# Patient Record
Sex: Male | Born: 1950
Health system: Southern US, Community
[De-identification: ages and names within clinical notes are randomized; demographics above are authoritative.]

## PROBLEM LIST (undated history)

## (undated) DIAGNOSIS — C61 Malignant neoplasm of prostate: Secondary | ICD-10-CM

## (undated) DIAGNOSIS — J449 Chronic obstructive pulmonary disease, unspecified: Secondary | ICD-10-CM

## (undated) DIAGNOSIS — J189 Pneumonia, unspecified organism: Secondary | ICD-10-CM

## (undated) DIAGNOSIS — I2510 Atherosclerotic heart disease of native coronary artery without angina pectoris: Secondary | ICD-10-CM

## (undated) DIAGNOSIS — I10 Essential (primary) hypertension: Secondary | ICD-10-CM

## (undated) DIAGNOSIS — Z720 Tobacco use: Secondary | ICD-10-CM

## (undated) DIAGNOSIS — R350 Frequency of micturition: Secondary | ICD-10-CM

## (undated) DIAGNOSIS — K429 Umbilical hernia without obstruction or gangrene: Secondary | ICD-10-CM

## (undated) DIAGNOSIS — H269 Unspecified cataract: Secondary | ICD-10-CM

## (undated) DIAGNOSIS — R0600 Dyspnea, unspecified: Secondary | ICD-10-CM

## (undated) DIAGNOSIS — M1990 Unspecified osteoarthritis, unspecified site: Secondary | ICD-10-CM

## (undated) DIAGNOSIS — I219 Acute myocardial infarction, unspecified: Secondary | ICD-10-CM

## (undated) DIAGNOSIS — G4730 Sleep apnea, unspecified: Secondary | ICD-10-CM

## (undated) HISTORY — DX: Atherosclerotic heart disease of native coronary artery without angina pectoris: I25.10

## (undated) HISTORY — PX: HERNIA REPAIR: SHX51

## (undated) HISTORY — DX: Unspecified cataract: H26.9

## (undated) HISTORY — PX: FRACTURE SURGERY: SHX138

---

## 1970-01-20 HISTORY — PX: OTHER SURGICAL HISTORY: SHX169

## 1990-01-20 HISTORY — PX: OTHER SURGICAL HISTORY: SHX169

## 2014-07-18 ENCOUNTER — Other Ambulatory Visit: Payer: Self-pay | Admitting: Urology

## 2014-08-08 NOTE — Patient Instructions (Addendum)
John Griffin  08/08/2014   Your procedure is scheduled on: 08/17/2014    Report to [facility] Main  Entrance take [facility]  elevators to 3rd floor to  [place] at     0900 AM.  Call this number if you have problems the morning of surgery [phone]   Remember: ONLY 1 PERSON MAY GO WITH YOU TO SHORT STAY TO GET  READY MORNING OF [name].  Do not eat food or drink liquids :After Midnight.     Take these medicines the morning of surgery with A SIP OF WATER:Zyrtec if needed                                 You may not have any metal on your body including hair pins and              piercings  Do not wear jewelry, , lotions, powders or perfumes, deodorant                         Men may shave face and neck.   Do not bring valuables to the hospital. [place].  Contacts, dentures or bridgework may not be worn into surgery.  Leave suitcase in the car. After surgery it may be brought to your room.       Special Instructions: coughing and deep breathing exercises, leg exercises               Please read over the following fact sheets you were given: _____________________________________________________________________             [facility] - Preparing for Surgery Before surgery, you can play an important role.  Because skin is not sterile, your skin needs to be as free of germs as possible.  You can reduce the number of germs on your skin by washing with CHG (chlorahexidine gluconate) soap before surgery.  CHG is an antiseptic cleaner which kills germs and bonds with the skin to continue killing germs even after washing. Please DO NOT use if you have an allergy to CHG or antibacterial soaps.  If your skin becomes reddened/irritated stop using the CHG and inform your nurse when you arrive at Short Stay. Do not shave (including legs and underarms) for at least 48 hours prior to the first CHG shower.  You  may shave your face/neck. Please follow these instructions carefully:  1.  Shower with CHG Soap the night before surgery and the  morning of Surgery.  2.  If you choose to wash your hair, wash your hair first as usual with your  normal  shampoo.  3.  After you shampoo, rinse your hair and body thoroughly to remove the  shampoo.                           4.  Use CHG as you would any other liquid soap.  You can apply chg directly  to the skin and wash                       Gently with a scrungie or clean washcloth.  5.  Apply the CHG  Soap to your body ONLY FROM THE NECK DOWN.   Do not use on face/ open                           Wound or open sores. Avoid contact with eyes, ears mouth and genitals (private parts).                       Wash face,  Genitals (private parts) with your normal soap.             6.  Wash thoroughly, paying special attention to the area where your surgery  will be performed.  7.  Thoroughly rinse your body with warm water from the neck down.  8.  DO NOT shower/wash with your normal soap after using and rinsing off  the CHG Soap.                9.  Pat yourself dry with a clean towel.            10.  Wear clean pajamas.            11.  Place clean sheets on your bed the night of your first shower and do not  sleep with pets. Day of Surgery : Do not apply any lotions/deodorants the morning of surgery.  Please wear clean clothes to the hospital/surgery center.  FAILURE TO FOLLOW THESE INSTRUCTIONS MAY RESULT IN THE CANCELLATION OF YOUR SURGERY PATIENT SIGNATURE_________________________________  NURSE SIGNATURE__________________________________  ________________________________________________________________________  WHAT IS A BLOOD TRANSFUSION? Blood Transfusion Information  A transfusion is the replacement of blood or some of its parts. Blood is made up of multiple cells which provide different functions.  Red blood cells carry oxygen and are used for blood loss  replacement.  White blood cells fight against infection.  Platelets control bleeding.  Plasma helps clot blood.  Other blood products are available for specialized needs, such as hemophilia or other clotting disorders. BEFORE THE TRANSFUSION  Who gives blood for transfusions?   Healthy volunteers who are fully evaluated to make sure their blood is safe. This is blood bank blood. Transfusion therapy is the safest it has ever been in the practice of medicine. Before blood is taken from a donor, a complete history is taken to make sure that person has no history of diseases nor engages in risky social behavior (examples are intravenous drug use or sexual activity with multiple partners). The donor's travel history is screened to minimize risk of transmitting infections, such as malaria. The donated blood is tested for signs of infectious diseases, such as HIV and hepatitis. The blood is then tested to be sure it is compatible with you in order to minimize the chance of a transfusion reaction. If you or a relative donates blood, this is often done in anticipation of surgery and is not appropriate for emergency situations. It takes many days to process the donated blood. RISKS AND COMPLICATIONS Although transfusion therapy is very safe and saves many lives, the main dangers of transfusion include:  1. Getting an infectious disease. 2. Developing a transfusion reaction. This is an allergic reaction to something in the blood you were given. Every precaution is taken to prevent this. The decision to have a blood transfusion has been considered carefully by your caregiver before blood is given. Blood is not given unless the benefits outweigh the risks. AFTER THE TRANSFUSION  Right after receiving a blood transfusion, you  will usually feel much better and more energetic. This is especially true if your red blood cells have gotten low (anemic). The transfusion raises the level of the red blood cells which  carry oxygen, and this usually causes an energy increase.  The nurse administering the transfusion will monitor you carefully for complications. HOME CARE INSTRUCTIONS  No special instructions are needed after a transfusion. You may find your energy is better. Speak with your caregiver about any limitations on activity for underlying diseases you may have. SEEK MEDICAL CARE IF:   Your condition is not improving after your transfusion.  You develop redness or irritation at the intravenous (IV) site. SEEK IMMEDIATE MEDICAL CARE IF:  Any of the following symptoms occur over the next 12 hours:  Shaking chills.  You have a temperature by mouth above 102 F (38.9 C), not controlled by medicine.  Chest, back, or muscle pain.  People around you feel you are not acting correctly or are confused.  Shortness of breath or difficulty breathing.  Dizziness and fainting.  You get a rash or develop hives.  You have a decrease in urine output.  Your urine turns a dark color or changes to pink, red, or brown. Any of the following symptoms occur over the next 10 days:  You have a temperature by mouth above 102 F (38.9 C), not controlled by medicine.  Shortness of breath.  Weakness after normal activity.  The white part of the eye turns yellow (jaundice).  You have a decrease in the amount of urine or are urinating less often.  Your urine turns a dark color or changes to pink, red, or brown. Document Released: 01/04/2000 Document Revised: 03/31/2011 Document Reviewed: 08/23/2007 ExitCare Patient Information 2014 [place].  _______________________________________________________________________  Incentive Spirometer  An incentive spirometer is a tool that can help keep your lungs clear and active. This tool measures how well you are filling your lungs with each breath. Taking long deep breaths may help reverse or decrease the chance of developing breathing (pulmonary) problems  (especially infection) following:  A long period of time when you are unable to move or be active. BEFORE THE PROCEDURE   If the spirometer includes an indicator to show your best effort, your nurse or respiratory therapist will set it to a desired goal.  If possible, sit up straight or lean slightly forward. Try not to slouch.  Hold the incentive spirometer in an upright position. INSTRUCTIONS FOR USE  3. Sit on the edge of your bed if possible, or sit up as far as you can in bed or on a chair. 4. Hold the incentive spirometer in an upright position. 5. Breathe out normally. 6. Place the mouthpiece in your mouth and seal your lips tightly around it. 7. Breathe in slowly and as deeply as possible, raising the piston or the ball toward the top of the column. 8. Hold your breath for 3-5 seconds or for as long as possible. Allow the piston or ball to fall to the bottom of the column. 9. Remove the mouthpiece from your mouth and breathe out normally. 10. Rest for a few seconds and repeat Steps 1 through 7 at least 10 times every 1-2 hours when you are awake. Take your time and take a few normal breaths between deep breaths. 11. The spirometer may include an indicator to show your best effort. Use the indicator as a goal to work toward during each repetition. 12. After each set of 10 deep breaths, practice  coughing to be sure your lungs are clear. If you have an incision (the cut made at the time of surgery), support your incision when coughing by placing a pillow or rolled up towels firmly against it. Once you are able to get out of bed, walk around indoors and cough well. You may stop using the incentive spirometer when instructed by your caregiver.  RISKS AND COMPLICATIONS  Take your time so you do not get dizzy or light-headed.  If you are in pain, you may need to take or ask for pain medication before doing incentive spirometry. It is harder to take a deep breath if you are having  pain. AFTER USE  Rest and breathe slowly and easily.  It can be helpful to keep track of a log of your progress. Your caregiver can provide you with a simple table to help with this. If you are using the spirometer at home, follow these instructions: [place] IF:   You are having difficultly using the spirometer.  You have trouble using the spirometer as often as instructed.  Your pain medication is not giving enough relief while using the spirometer.  You develop fever of 100.5 F (38.1 C) or higher. SEEK IMMEDIATE MEDICAL CARE IF:   You cough up bloody sputum that had not been present before.  You develop fever of 102 F (38.9 C) or greater.  You develop worsening pain at or near the incision site. MAKE SURE YOU:   Understand these instructions.  Will watch your condition.  Will get help right away if you are not doing well or get worse. Document Released: 05/19/2006 Document Revised: 03/31/2011 Document Reviewed: 07/20/2006 [facility] Patient Information 2014 [place], [place].   ________________________________________________________________________

## 2014-08-09 ENCOUNTER — Ambulatory Visit (HOSPITAL_COMMUNITY)
Admission: RE | Admit: 2014-08-09 | Discharge: 2014-08-09 | Disposition: A | Discharge status: Home / Self Care | Payer: BC Managed Care – PPO | Source: Ambulatory Visit | Attending: Urology | Admitting: Urology

## 2014-08-09 ENCOUNTER — Encounter (HOSPITAL_COMMUNITY)
Admission: RE | Admit: 2014-08-09 | Discharge: 2014-08-09 | Disposition: A | Discharge status: Home / Self Care | Payer: BC Managed Care – PPO | Source: Ambulatory Visit | Attending: Urology | Admitting: Urology

## 2014-08-09 ENCOUNTER — Encounter (HOSPITAL_COMMUNITY): Payer: Self-pay

## 2014-08-09 DIAGNOSIS — Z01818 Encounter for other preprocedural examination: Secondary | ICD-10-CM | POA: Diagnosis not present

## 2014-08-09 DIAGNOSIS — Z720 Tobacco use: Secondary | ICD-10-CM | POA: Diagnosis not present

## 2014-08-09 DIAGNOSIS — R0602 Shortness of breath: Secondary | ICD-10-CM | POA: Insufficient documentation

## 2014-08-09 DIAGNOSIS — R05 Cough: Secondary | ICD-10-CM | POA: Diagnosis not present

## 2014-08-09 HISTORY — DX: Frequency of micturition: R35.0

## 2014-08-09 HISTORY — DX: Unspecified osteoarthritis, unspecified site: M19.90

## 2014-08-09 LAB — BASIC METABOLIC PANEL
Anion gap: 7 (ref 5–15)
BUN: 13 mg/dL (ref 6–20)
CO2: 28 mmol/L (ref 22–32)
Calcium: 9.4 mg/dL (ref 8.9–10.3)
Chloride: 105 mmol/L (ref 101–111)
Creatinine, Ser: 0.62 mg/dL (ref 0.61–1.24)
GFR calc non Af Amer: >60 mL/min (ref >60)
GLUCOSE: 124 mg/dL — AB (ref 65–99)
Potassium: 4.2 mmol/L (ref 3.5–5.1)
SODIUM: 140 mmol/L (ref 135–145)

## 2014-08-09 LAB — CBC
HCT: 49.4 % (ref 39.0–52.0)
Hemoglobin: 17.1 g/dL — ABNORMAL HIGH (ref 13.0–17.0)
MCH: 31.8 pg (ref 26.0–34.0)
MCHC: 34.6 g/dL (ref 30.0–36.0)
MCV: 92 fL (ref 78.0–100.0)
PLATELETS: 168 10*3/uL (ref 150–400)
RBC: 5.37 MIL/uL (ref 4.22–5.81)
RDW: 13.1 % (ref 11.5–15.5)
WBC: 11.3 10*3/uL — AB (ref 4.0–10.5)

## 2014-08-09 LAB — ABO/RH: ABO/RH(D): A NEG

## 2014-08-10 NOTE — Progress Notes (Signed)
Final EKg done 08/09/14 in [facility].

## 2014-08-16 MED ORDER — DEXTROSE 5 % IV SOLN
3.0000 g | INTRAVENOUS | Status: AC
  Administered 2014-08-17: 3 g via INTRAVENOUS
  Filled 2014-08-16: qty 3000

## 2014-08-16 NOTE — H&P (Signed)
Chief Complaint Prostate Cancer   Reason For Visit Reason for consult: To discuss treatment options for prostate cancer and specifically to consider a robotic prostatectomy.  Physician requesting consult: Dr. Eda Keys  PCP: Dr. Juanita Craver   History of Present Illness John Griffin is a 64 year old gentleman who was found to have an elevated PSA of 6.7 prompting a prostate needle biopsy by Dr. Junious Silk on 06/06/14. This demonstrated Gleason 4+3=7 adenocarcinoma of the prostate with 4 out of 12 biopsy cores positive for malignancy. He has no family history of prostate cancer. He has been counseled about his treatment options by Dr. Junious Silk. He also apparently underwent a CT scan (hematuria protocol) which apparently was ordered due to microscopic hematuria following his prostate biopsy. He denies a past history of gross hematuria or microscopic hematuria prior to his biopsy.    ** His past medical history is significant for a 40+-pack-year history of smoking. His father was also a long-time smoker and apparently developed COPD and died at age 65. There is no family history of cardiovascular disease.     TNM stage: cT1c Nx Mx  PSA: 6.7  Gleason score: 4+3=7  Biopsy (06/06/14): 4/12 cores positive -- L apex (50%, 4+3=7), L mid (60%, 4+3=7), L lateral base (80%, 3+4=7), L base (20%, 3+4=7)  Prostate volume: 33.0 cc    Nomogram  OC disease: 37%  EPE: 60%  SVI: 7%  LNI: 8%  PFS (surgery): 67% at 4 years, 51% at 9 years    Urinary function: He has moderate baseline voiding symptoms including urinary frequency, intermittency, urgency, weak stream, and nocturia 2 times per night. IPSS is 17.  Erectile function: He does have severe baseline erectile dysfunction. SHIM score is 1.   Past Medical History Problems  1. History of essential hypertension (Z86.79)  Surgical History Problems  1. History of Leg Repair 2. History of Shoulder Surgery  Current Meds 1. Aspirin  325 MG Oral Tablet;  Therapy: (Recorded:20Jun2016) to Recorded 2. Flonase 50 MCG/ACT SUSP;  Therapy: (Recorded:20Jun2016) to Recorded 3. Zyrtec TABS;  Therapy: (Recorded:20Jun2016) to Recorded  Allergies Medication  1. No Known Drug Allergies  Family History Problems  1. Denied: Family history of prostate cancer  Social History Problems    Current every day smoker (F17.200)   Father deceased   Married   Occasional alcohol use   Retired   Two children  Review of Systems Genitourinary, constitutional, skin, eye, otolaryngeal, hematologic/lymphatic, cardiovascular, pulmonary, endocrine, musculoskeletal, gastrointestinal, neurological and psychiatric system(s) were reviewed and pertinent findings if present are noted and are otherwise negative.  Genitourinary: no hematuria.  Constitutional: no night sweats.  Hematologic/Lymphatic: no swollen glands.  Cardiovascular: no chest pain and no leg swelling.  Respiratory: no shortness of breath, no cough, no wheezing and no shortness of breath during exertion.    Vitals Vital Signs [Data Includes: Last 1 Day]  Recorded: 28Jun2016 08:04AM  Blood Pressure: 135 / 83 Temperature: 97.5 F Heart Rate: 87 Recorded: 20Jun2016 09:49AM  Blood Pressure: 190 / 116 Temperature: 97.9 F Heart Rate: 116  Physical Exam Constitutional: Well nourished and well developed . No acute distress.  ENT:. The ears and nose are normal in appearance.  Neck: The appearance of the neck is normal and no neck mass is present.  Pulmonary: No respiratory distress and normal respiratory rhythm and effort.  Cardiovascular: Heart rate and rhythm are normal . No peripheral edema.  Abdomen: The abdomen is obese. The abdomen is soft and nontender. No  masses are palpated. No CVA tenderness. No hepatosplenomegaly noted. He has a very large and reducible umbilical hernia.  Rectal: Rectal exam demonstrates normal sphincter tone, no tenderness and no masses. Prostate  size is estimated to be 40 g. The prostate has no nodularity and is not tender. The left seminal vesicle is nonpalpable. The right seminal vesicle is nonpalpable. The perineum is normal on inspection.  Lymphatics: The supraclavicular, femoral and inguinal nodes are not enlarged or tender.  Skin: Normal skin turgor, no visible rash and no visible skin lesions.  Neuro/Psych:. Mood and affect are appropriate.    Results/Data Urine [Data Includes: Last 1 Day]   [id number]  COLOR AMBER   APPEARANCE CLEAR   SPECIFIC GRAVITY 1.030   pH 6.0   GLUCOSE NEG mg/dL  BILIRUBIN NEG   KETONE NEG mg/dL  BLOOD TRACE   PROTEIN NEG mg/dL  UROBILINOGEN 0.2 mg/dL  NITRITE NEG   LEUKOCYTE ESTERASE NEG   SQUAMOUS EPITHELIAL/HPF RARE   WBC NONE SEEN WBC/hpf  RBC 0-2 RBC/hpf  BACTERIA RARE   CRYSTALS NONE SEEN   CASTS NONE SEEN   Other MUCUS   Selected Results  AU CT-HEMATURIA PROTOCOL [id number] 12:00AM Festus Aloe   Test Name Result Flag Reference  AU CT-HEMATURIA PROTOCOL (Report)    ** RADIOLOGY REPORT BY [facility] **   CLINICAL DATA: Microhematuria. Newly diagnosed prostate carcinoma.  EXAM: CT ABDOMEN AND PELVIS WITHOUT AND WITH CONTRAST  TECHNIQUE: Multidetector CT imaging of the abdomen and pelvis was performed following the standard protocol before and following the bolus administration of intravenous contrast.  CONTRAST: 125 mL Isovue 300  COMPARISON: None.  FINDINGS: Lower chest: No acute findings. Mild scarring noted in right lung base.  Hepatobiliary: No masses or other significant abnormality identified. Gallbladder is unremarkable.  Pancreas: No evidence of mass, inflammatory changes, or other significant abnormality.  Spleen: Within normal limits in size and appearance.  Adrenal Glands: No masses identified.  Kidneys/Urinary Tract: No evidence of urolithiasis or hydronephrosis. No solid or complex cystic renal masses identified. No masses or  calculi seen involving the lower urinary tract. Circumaortic left renal vein incidentally noted.  Stomach/Bowel/Peritoneum: No evidence of wall thickening, mass, or obstruction. Mild sigmoid diverticulosis is noted, without evidence of diverticulitis.  Vascular/Lymphatic: No pathologically enlarged lymph nodes identified. No abdominal aortic aneurysm or other significant retroperitoneal abnormality noted.  Reproductive: No masses or other significant abnormality identified. Normal size prostate gland. Seminal vesicles are symmetric.  Other: Small paraumbilical ventral hernia is seen containing only fat. No evidence of herniated bowel loops.  Musculoskeletal: No suspicious bone lesions identified. Moderate lumbar spine degenerative spondylosis noted.  IMPRESSION: No evidence of metastatic disease within the abdomen or pelvis.  No radiographic evidence of urinary tract neoplasm, urolithiasis, or hydronephrosis.  Small paraumbilical ventral hernia containing only fat.   Electronically Signed  By: Earle Gell M.D.  On: 07/14/2014 15:22   BUN & CREATININE [id number] 10:29AM Festus Aloe  SPECIMEN TYPE: BLOOD   Test Name Result Flag Reference  CREATININE 0.63 mg/dL  0.50-1.50  BUN 13 mg/dL  6-23  Est GFR, African American >89 mL/min    Est GFR, NonAfrican American >89 mL/min    THE ESTIMATED GFR IS A CALCULATION VALID FOR ADULTS (>=71 YEARS OLD) THAT USES THE CKD-EPI ALGORITHM TO ADJUST FOR AGE AND SEX. IT IS   NOT TO BE USED FOR CHILDREN, PREGNANT WOMEN, HOSPITALIZED PATIENTS,    PATIENTS ON DIALYSIS, OR WITH RAPIDLY CHANGING KIDNEY FUNCTION. ACCORDING TO THE  NKDEP, EGFR >89 IS NORMAL, 60-89 SHOWS MILD IMPAIRMENT, 30-59 SHOWS MODERATE IMPAIRMENT, 15-29 SHOWS SEVERE IMPAIRMENT AND <15 IS ESRD.    I have independently reviewed his CT scan. There is no evidence of pelvic lymphadenopathy. I have also reviewed his medical records, PSA results, and pathology report. Findings  are as dictated above.  Assessment Assessed  1. Prostate cancer (C61)  Plan Health Maintenance  1. UA With REFLEX; [Do Not Release]; Status:Complete;   Done: [id number] 07:46AM Prostate cancer  2. Follow-up Office  Follow-up - will call to schedule surgery  Status: Canceled -  Appointment,Date of Service 3. Follow-up Schedule Surgery Office  Follow-up  Status: Complete  Done: [id number] 4. PT/OT Referral Referral  Referral  Status: Hold For - Date of Service,Physical Therapy   Requested for: 11Jul2016  Discussion/Summary 1. Prostate cancer: Mr. Gargis feels very well informed about his options. He does strongly wish to proceed with surgical therapy and feels that this would be most beneficial for his voiding symptoms as well as treatment of his cancer.   The patient was counseled about the natural history of prostate cancer and the standard treatment options that are available for prostate cancer. It was explained to him how his age and life expectancy, clinical stage, Gleason score, and PSA affect his prognosis, the decision to proceed with additional staging studies, as well as how that information influences recommended treatment strategies. We discussed the roles for active surveillance, radiation therapy, surgical therapy, androgen deprivation, as well as ablative therapy options for the treatment of prostate cancer as appropriate to his individual cancer situation. We discussed the risks and benefits of these options with regard to their impact on cancer control and also in terms of potential adverse events, complications, and impact on quiality of life particularly related to urinary, bowel, and sexual function. The patient was encouraged to ask questions throughout the discussion today and all questions were answered to his stated satisfaction. In addition, the patient was provided with and/or directed to appropriate resources and literature for further education about prostate cancer and  treatment options.   We discussed surgical therapy for prostate cancer including the different available surgical approaches. We discussed, in detail, the risks and expectations of surgery with regard to cancer control, urinary control, and erectile function as well as the expected postoperative recovery process. Additional risks of surgery including but not limited to bleeding, infection, hernia formation, nerve damage, lymphocele formation, bowel/rectal injury potentially necessitating colostomy, damage to the urinary tract resulting in urine leakage, urethral stricture, and the cardiopulmonary risks such as myocardial infarction, stroke, death, venothromboembolism, etc. were explained. The risk of open surgical conversion for robotic/laparoscopic prostatectomy was also discussed.     He will be scheduled for a right nerve sparing robot-assisted laparoscopic radical prostatectomy and pelvic lymphadenectomy. I did offer him a prescription for nicotine patches and recommended that he try to stop smoking prior to his surgery. He will give this consideration. He at least plans to try to cut back on his own although we'll give consideration to quitting smoking prior to his general anesthetic. He understands this would be his main perioperative risk factor.    2. Umbilical hernia: He does have a large but reducible umbilical hernia. I did tell him we could look at this intraoperatively and see if this could be repaired primarily although did not recommend mesh repair at the same time as his prostatectomy. We have discussed the potential risks including the increased risk of recurrence with a primary  repair versus mesh repair. He expressed his understanding and gives consent.    Cc: Dr. Juanita Craver  Dr. Festus Aloe  A total of 75 minutes were spent in the overall care of the patient today with 55 minutes in direct face to face consultation.    Signatures Electronically signed by : Raynelle Bring, M.D.; Jul 18 2014 10:33AM EST

## 2014-08-17 ENCOUNTER — Inpatient Hospital Stay (HOSPITAL_COMMUNITY): Payer: BC Managed Care – PPO | Admitting: Anesthesiology

## 2014-08-17 ENCOUNTER — Encounter (HOSPITAL_COMMUNITY): Payer: Self-pay | Admitting: *Deleted

## 2014-08-17 ENCOUNTER — Inpatient Hospital Stay (HOSPITAL_COMMUNITY)
Admission: RE | Admit: 2014-08-17 | Discharge: 2014-08-18 | DRG: 708 | Disposition: A | Discharge status: Home / Self Care | Payer: BC Managed Care – PPO | Source: Ambulatory Visit | Attending: Urology | Admitting: Urology

## 2014-08-17 ENCOUNTER — Encounter (HOSPITAL_COMMUNITY)
Admission: RE | Disposition: A | Discharge status: Home / Self Care | Payer: Self-pay | Source: Ambulatory Visit | Attending: Urology

## 2014-08-17 DIAGNOSIS — R3912 Poor urinary stream: Secondary | ICD-10-CM | POA: Diagnosis present

## 2014-08-17 DIAGNOSIS — N529 Male erectile dysfunction, unspecified: Secondary | ICD-10-CM | POA: Diagnosis present

## 2014-08-17 DIAGNOSIS — F17210 Nicotine dependence, cigarettes, uncomplicated: Secondary | ICD-10-CM | POA: Diagnosis present

## 2014-08-17 DIAGNOSIS — R351 Nocturia: Secondary | ICD-10-CM | POA: Diagnosis present

## 2014-08-17 DIAGNOSIS — R350 Frequency of micturition: Secondary | ICD-10-CM | POA: Diagnosis present

## 2014-08-17 DIAGNOSIS — C61 Malignant neoplasm of prostate: Secondary | ICD-10-CM | POA: Diagnosis present

## 2014-08-17 DIAGNOSIS — K429 Umbilical hernia without obstruction or gangrene: Secondary | ICD-10-CM | POA: Diagnosis present

## 2014-08-17 DIAGNOSIS — Z7982 Long term (current) use of aspirin: Secondary | ICD-10-CM

## 2014-08-17 DIAGNOSIS — K5730 Diverticulosis of large intestine without perforation or abscess without bleeding: Secondary | ICD-10-CM | POA: Diagnosis present

## 2014-08-17 DIAGNOSIS — R312 Other microscopic hematuria: Secondary | ICD-10-CM | POA: Diagnosis present

## 2014-08-17 HISTORY — PX: ROBOT ASSISTED LAPAROSCOPIC RADICAL PROSTATECTOMY: SHX5141

## 2014-08-17 HISTORY — PX: LYMPHADENECTOMY: SHX5960

## 2014-08-17 LAB — HEMOGLOBIN AND HEMATOCRIT, BLOOD
HCT: 46.3 % (ref 39.0–52.0)
Hemoglobin: 15.7 g/dL (ref 13.0–17.0)

## 2014-08-17 LAB — TYPE AND SCREEN
ABO/RH(D): A NEG
Antibody Screen: NEGATIVE

## 2014-08-17 SURGERY — ROBOTIC ASSISTED LAPAROSCOPIC RADICAL PROSTATECTOMY LEVEL 3
Anesthesia: General

## 2014-08-17 MED ORDER — HEPARIN SODIUM (PORCINE) 1000 UNIT/ML IJ SOLN
INTRAMUSCULAR | Status: AC
  Filled 2014-08-17: qty 1

## 2014-08-17 MED ORDER — GLYCOPYRROLATE 0.2 MG/ML IJ SOLN
INTRAMUSCULAR | Status: DC | PRN
Start: 2014-08-17 — End: 2014-08-17
  Administered 2014-08-17: .8 mg via INTRAVENOUS

## 2014-08-17 MED ORDER — FENTANYL CITRATE (PF) 250 MCG/5ML IJ SOLN
INTRAMUSCULAR | Status: AC
  Filled 2014-08-17: qty 5

## 2014-08-17 MED ORDER — MORPHINE SULFATE 2 MG/ML IJ SOLN: 2.0000 mg | INTRAMUSCULAR | Status: DC | PRN

## 2014-08-17 MED ORDER — DIPHENHYDRAMINE HCL 12.5 MG/5ML PO ELIX: 12.5000 mg | ORAL_SOLUTION | Freq: Four times a day (QID) | ORAL | Status: DC | PRN

## 2014-08-17 MED ORDER — ACETAMINOPHEN 325 MG PO TABS: 650.0000 mg | ORAL_TABLET | ORAL | Status: DC | PRN

## 2014-08-17 MED ORDER — DOCUSATE SODIUM 100 MG PO CAPS
100.0000 mg | ORAL_CAPSULE | Freq: Two times a day (BID) | ORAL | Status: DC
  Administered 2014-08-17 – 2014-08-18 (×2): 100 mg via ORAL
  Filled 2014-08-17 (×3): qty 1

## 2014-08-17 MED ORDER — HYDROMORPHONE HCL 1 MG/ML IJ SOLN: 0.2500 mg | INTRAMUSCULAR | Status: DC | PRN

## 2014-08-17 MED ORDER — HYDROMORPHONE HCL 1 MG/ML IJ SOLN
INTRAMUSCULAR | Status: DC | PRN
  Administered 2014-08-17 (×5): .4 mg via INTRAVENOUS

## 2014-08-17 MED ORDER — ARTIFICIAL TEARS OP OINT
TOPICAL_OINTMENT | OPHTHALMIC | Status: AC
  Filled 2014-08-17: qty 3.5

## 2014-08-17 MED ORDER — DOCUSATE SODIUM 100 MG PO CAPS: 100.0000 mg | ORAL_CAPSULE | Freq: Two times a day (BID) | ORAL | Status: DC

## 2014-08-17 MED ORDER — SUCCINYLCHOLINE CHLORIDE 20 MG/ML IJ SOLN
INTRAMUSCULAR | Status: DC | PRN
  Administered 2014-08-17: 140 mg via INTRAVENOUS

## 2014-08-17 MED ORDER — SODIUM CHLORIDE 0.9 % IR SOLN
Status: DC | PRN
  Administered 2014-08-17: 1000 mL via INTRAVESICAL

## 2014-08-17 MED ORDER — PROMETHAZINE HCL 25 MG/ML IJ SOLN: 6.2500 mg | INTRAMUSCULAR | Status: DC | PRN

## 2014-08-17 MED ORDER — SENNA 8.6 MG PO TABS: 1.0000 | ORAL_TABLET | Freq: Every day | ORAL | Status: DC

## 2014-08-17 MED ORDER — PROPOFOL 10 MG/ML IV BOLUS
INTRAVENOUS | Status: AC
  Filled 2014-08-17: qty 20

## 2014-08-17 MED ORDER — DIPHENHYDRAMINE HCL 50 MG/ML IJ SOLN: 12.5000 mg | Freq: Four times a day (QID) | INTRAMUSCULAR | Status: DC | PRN

## 2014-08-17 MED ORDER — LACTATED RINGERS IV SOLN
INTRAVENOUS | Status: DC
  Administered 2014-08-17 (×3): via INTRAVENOUS
  Administered 2014-08-17: 1000 mL via INTRAVENOUS
  Administered 2014-08-17: 16:00:00 via INTRAVENOUS

## 2014-08-17 MED ORDER — DEXAMETHASONE SODIUM PHOSPHATE 10 MG/ML IJ SOLN
INTRAMUSCULAR | Status: DC | PRN
  Administered 2014-08-17: 10 mg via INTRAVENOUS

## 2014-08-17 MED ORDER — CEFAZOLIN SODIUM 1-5 GM-% IV SOLN
1.0000 g | Freq: Three times a day (TID) | INTRAVENOUS | Status: AC
  Administered 2014-08-17 – 2014-08-18 (×2): 1 g via INTRAVENOUS
  Filled 2014-08-17 (×2): qty 50

## 2014-08-17 MED ORDER — LACTATED RINGERS IV SOLN
INTRAVENOUS | Status: DC | PRN
  Administered 2014-08-17: 1 mL

## 2014-08-17 MED ORDER — NEOSTIGMINE METHYLSULFATE 10 MG/10ML IV SOLN
INTRAVENOUS | Status: DC | PRN
  Administered 2014-08-17: 5 mg via INTRAVENOUS

## 2014-08-17 MED ORDER — ALBUTEROL SULFATE HFA 108 (90 BASE) MCG/ACT IN AERS
INHALATION_SPRAY | RESPIRATORY_TRACT | Status: DC | PRN
  Administered 2014-08-17: 4 via RESPIRATORY_TRACT

## 2014-08-17 MED ORDER — HYDROMORPHONE HCL 2 MG/ML IJ SOLN
INTRAMUSCULAR | Status: AC
  Filled 2014-08-17: qty 1

## 2014-08-17 MED ORDER — SULFAMETHOXAZOLE-TRIMETHOPRIM 800-160 MG PO TABS: 1.0000 | ORAL_TABLET | Freq: Two times a day (BID) | ORAL | Status: DC

## 2014-08-17 MED ORDER — NEOSTIGMINE METHYLSULFATE 10 MG/10ML IV SOLN
INTRAVENOUS | Status: AC
  Filled 2014-08-17: qty 1

## 2014-08-17 MED ORDER — HYDROCODONE-ACETAMINOPHEN 5-325 MG PO TABS
1.0000 | ORAL_TABLET | Freq: Four times a day (QID) | ORAL | Status: DC | PRN
Start: 2014-08-17 — End: 2015-03-02

## 2014-08-17 MED ORDER — FENTANYL CITRATE (PF) 100 MCG/2ML IJ SOLN
INTRAMUSCULAR | Status: DC | PRN
  Administered 2014-08-17: 100 ug via INTRAVENOUS
  Administered 2014-08-17 (×3): 50 ug via INTRAVENOUS

## 2014-08-17 MED ORDER — KETOROLAC TROMETHAMINE 15 MG/ML IJ SOLN
15.0000 mg | Freq: Four times a day (QID) | INTRAMUSCULAR | Status: DC
  Administered 2014-08-17 – 2014-08-18 (×4): 15 mg via INTRAVENOUS
  Filled 2014-08-17 (×6): qty 1

## 2014-08-17 MED ORDER — KCL IN DEXTROSE-NACL 20-5-0.45 MEQ/L-%-% IV SOLN
INTRAVENOUS | Status: DC
  Administered 2014-08-17 – 2014-08-18 (×2): via INTRAVENOUS
  Filled 2014-08-17 (×3): qty 1000

## 2014-08-17 MED ORDER — STERILE WATER FOR IRRIGATION IR SOLN
Status: DC | PRN
  Administered 2014-08-17: 3000 mL

## 2014-08-17 MED ORDER — MIDAZOLAM HCL 5 MG/5ML IJ SOLN
INTRAMUSCULAR | Status: DC | PRN
  Administered 2014-08-17 (×2): 1 mg via INTRAVENOUS

## 2014-08-17 MED ORDER — ROCURONIUM BROMIDE 100 MG/10ML IV SOLN
INTRAVENOUS | Status: AC
  Filled 2014-08-17: qty 1

## 2014-08-17 MED ORDER — LIDOCAINE HCL (CARDIAC) 20 MG/ML IV SOLN
INTRAVENOUS | Status: DC | PRN
  Administered 2014-08-17: 80 mg via INTRAVENOUS

## 2014-08-17 MED ORDER — ONDANSETRON HCL 4 MG/2ML IJ SOLN
INTRAMUSCULAR | Status: AC
  Filled 2014-08-17: qty 2

## 2014-08-17 MED ORDER — ROCURONIUM BROMIDE 100 MG/10ML IV SOLN
INTRAVENOUS | Status: DC | PRN
  Administered 2014-08-17 (×4): 10 mg via INTRAVENOUS
  Administered 2014-08-17: 40 mg via INTRAVENOUS
  Administered 2014-08-17 (×2): 10 mg via INTRAVENOUS

## 2014-08-17 MED ORDER — PHENYLEPHRINE HCL 10 MG/ML IJ SOLN
INTRAMUSCULAR | Status: DC | PRN
  Administered 2014-08-17 (×2): 80 ug via INTRAVENOUS

## 2014-08-17 MED ORDER — LABETALOL HCL 5 MG/ML IV SOLN
5.0000 mg | INTRAVENOUS | Status: AC | PRN
  Administered 2014-08-17 (×2): 5 mg via INTRAVENOUS

## 2014-08-17 MED ORDER — ONDANSETRON HCL 4 MG/2ML IJ SOLN
INTRAMUSCULAR | Status: DC | PRN
Start: 2014-08-17 — End: 2014-08-17
  Administered 2014-08-17: 4 mg via INTRAVENOUS

## 2014-08-17 MED ORDER — MIDAZOLAM HCL 2 MG/2ML IJ SOLN
INTRAMUSCULAR | Status: AC
  Filled 2014-08-17: qty 2

## 2014-08-17 MED ORDER — GLYCOPYRROLATE 0.2 MG/ML IJ SOLN
INTRAMUSCULAR | Status: AC
  Filled 2014-08-17: qty 2

## 2014-08-17 MED ORDER — BUPIVACAINE-EPINEPHRINE (PF) 0.25% -1:200000 IJ SOLN
INTRAMUSCULAR | Status: AC
  Filled 2014-08-17: qty 30

## 2014-08-17 MED ORDER — SODIUM CHLORIDE 0.9 % IV BOLUS (SEPSIS)
1000.0000 mL | Freq: Once | INTRAVENOUS | Status: AC
  Administered 2014-08-17: 1000 mL via INTRAVENOUS

## 2014-08-17 MED ORDER — PROPOFOL 10 MG/ML IV BOLUS
INTRAVENOUS | Status: DC | PRN
  Administered 2014-08-17: 150 mg via INTRAVENOUS

## 2014-08-17 MED ORDER — LABETALOL HCL 5 MG/ML IV SOLN
INTRAVENOUS | Status: AC
  Filled 2014-08-17: qty 4

## 2014-08-17 SURGICAL SUPPLY — 52 items
CABLE HIGH FREQUENCY MONO STRZ (ELECTRODE) ×4 IMPLANT
CATH FOLEY 2WAY SLVR 18FR 30CC (CATHETERS) ×4 IMPLANT
CATH ROBINSON RED A/P 16FR (CATHETERS) ×4 IMPLANT
CATH ROBINSON RED A/P 8FR (CATHETERS) ×4 IMPLANT
CATH TIEMANN FOLEY 18FR 5CC (CATHETERS) ×4 IMPLANT
CHLORAPREP W/TINT 26ML (MISCELLANEOUS) ×4 IMPLANT
CLIP LIGATING HEM O LOK PURPLE (MISCELLANEOUS) ×8 IMPLANT
CLOTH BEACON ORANGE TIMEOUT ST (SAFETY) ×4 IMPLANT
COVER SURGICAL LIGHT HANDLE (MISCELLANEOUS) ×4 IMPLANT
COVER TIP SHEARS 8 DVNC (MISCELLANEOUS) ×2 IMPLANT
COVER TIP SHEARS 8MM DA VINCI (MISCELLANEOUS) ×2
CUTTER ECHEON FLEX ENDO 45 340 (ENDOMECHANICALS) ×4 IMPLANT
DECANTER SPIKE VIAL GLASS SM (MISCELLANEOUS) ×4 IMPLANT
DRAPE SURG IRRIG POUCH 19X23 (DRAPES) ×4 IMPLANT
DRSG TEGADERM 4X4.75 (GAUZE/BANDAGES/DRESSINGS) ×4 IMPLANT
DRSG TEGADERM 6X8 (GAUZE/BANDAGES/DRESSINGS) ×8 IMPLANT
ELECT REM PT RETURN 9FT ADLT (ELECTROSURGICAL) ×4
ELECTRODE REM PT RTRN 9FT ADLT (ELECTROSURGICAL) ×2 IMPLANT
GLOVE BIO SURGEON STRL SZ 6.5 (GLOVE) ×3 IMPLANT
GLOVE BIO SURGEONS STRL SZ 6.5 (GLOVE) ×1
GLOVE BIOGEL M STRL SZ7.5 (GLOVE) ×8 IMPLANT
GOWN STRL REUS W/TWL LRG LVL3 (GOWN DISPOSABLE) ×12 IMPLANT
HOLDER FOLEY CATH W/STRAP (MISCELLANEOUS) ×4 IMPLANT
IV LACTATED RINGERS 1000ML (IV SOLUTION) ×4 IMPLANT
KIT ACCESSORY DA VINCI DISP (KITS) ×2
KIT ACCESSORY DVNC DISP (KITS) ×2 IMPLANT
LIQUID BAND (GAUZE/BANDAGES/DRESSINGS) ×4 IMPLANT
NDL SAFETY ECLIPSE 18X1.5 (NEEDLE) ×2 IMPLANT
NEEDLE HYPO 18GX1.5 SHARP (NEEDLE) ×2
PACK ROBOT UROLOGY CUSTOM (CUSTOM PROCEDURE TRAY) ×4 IMPLANT
RELOAD GREEN ECHELON 45 (STAPLE) ×4 IMPLANT
SET TUBE IRRIG SUCTION NO TIP (IRRIGATION / IRRIGATOR) ×4 IMPLANT
SHEET LAVH (DRAPES) IMPLANT
SOLUTION ELECTROLUBE (MISCELLANEOUS) ×4 IMPLANT
SPONGE DRAIN TRACH 4X4 STRL 2S (GAUZE/BANDAGES/DRESSINGS) ×4 IMPLANT
SUT ETHILON 3 0 PS 1 (SUTURE) ×4 IMPLANT
SUT MNCRL 3 0 RB1 (SUTURE) ×2 IMPLANT
SUT MNCRL 3 0 VIOLET RB1 (SUTURE) ×2 IMPLANT
SUT MNCRL AB 4-0 PS2 18 (SUTURE) ×8 IMPLANT
SUT MONOCRYL 3 0 RB1 (SUTURE) ×4
SUT VIC AB 0 CT1 27 (SUTURE) ×2
SUT VIC AB 0 CT1 27XBRD ANTBC (SUTURE) ×2 IMPLANT
SUT VIC AB 0 UR5 27 (SUTURE) ×4 IMPLANT
SUT VIC AB 2-0 SH 27 (SUTURE) ×2
SUT VIC AB 2-0 SH 27X BRD (SUTURE) ×2 IMPLANT
SUT VICRYL 0 UR6 27IN ABS (SUTURE) ×8 IMPLANT
SUT VLOC 180 0 9IN  GS21 (SUTURE) ×2
SUT VLOC 180 0 9IN GS21 (SUTURE) ×2 IMPLANT
SYR 27GX1/2 1ML LL SAFETY (SYRINGE) ×4 IMPLANT
TOWEL OR 17X26 10 PK STRL BLUE (TOWEL DISPOSABLE) ×4 IMPLANT
TOWEL OR NON WOVEN STRL DISP B (DISPOSABLE) ×4 IMPLANT
WATER STERILE IRR 1500ML POUR (IV SOLUTION) ×8 IMPLANT

## 2014-08-17 NOTE — Interval H&P Note (Signed)
History and Physical Interval Note:  08/17/2014 11:25 AM  John Griffin  has presented today for surgery, with the diagnosis of PROSTATE CANCER, UMBILICAL HERNIA  The various methods of treatment have been discussed with the patient and family. After consideration of risks, benefits and other options for treatment, the patient has consented to  Procedure(s): ROBOTIC ASSISTED LAPAROSCOPIC RADICAL PROSTATECTOMY LEVEL 3  AND UMBILICAL HERNIA REPAIR (N/A) LYMPHADENECTOMY (Bilateral) as a surgical intervention .  The patient's history has been reviewed, patient examined, no change in status, stable for surgery.  I have reviewed the patient's chart and labs.  Questions were answered to the patient's satisfaction.     Shrika Milos,LES

## 2014-08-17 NOTE — Discharge Summary (Signed)
Date of admission: 08/17/2014  Date of discharge: 08/18/2014  Admission diagnosis: Prostate cancer, Umbilical hernia  Discharge diagnosis: Prostate cancer, Umbilical hernia  Secondary diagnoses:  Umbilical Hernia Hypertension (POA)  History and Physical: For full details, please see admission history and physical. Briefly, Sherill Wegener is a 64 y.o. year old patient with intermediate risk prostate cancer. He is being admitted following robot-assisted laparoscopic radical prostatectomy (RIGHT nerve sparing) with pelvic lymphadenectomy.   Hospital Course:   Prostate cancer-He underwent a robot-assisted laparoscopic radical prostatectomy (RIGHT nerve sparing) with bilateral pelvic lymphadenectomy and primary repair of umbilical hernia on 0/99/83. He tolerated the procedure well and was transferred to the post-surgical floor. He did well over the course of POD 0 to POD1 with early ambulation and acceptable drain output. Foley cathter remained with drainage of clear urine. He was advanced to clears the morning of POD 1, received a suppository and his fluids were medlocked. He continued to tolerate PO intake and his pain was well controlled on oral medications. JP output was appropriate and the drain was removed. He was discharged home on POD 1 with his foley catheter in place which was draining clear, yellow urine. He will follow-up as scheduled for TOV and pathology review.   Umbilical hernia-status post primary repair with prolene suture at time of prostatectomy. Abdominal binder in place to continue following discharge.  Laboratory values:   Recent Labs  08/17/14 1700 08/18/14 0506  HGB 15.7 15.2  HCT 46.3 45.5   No results for input(s): CREATININE in the last 72 hours.  Disposition: Home  Discharge instruction: The patient was instructed to be ambulatory but told to refrain from heavy lifting, strenuous activity, or driving.  1. Activity:  You are encouraged to ambulate frequently  (about every hour during waking hours) to help prevent blood clots from forming in your legs or lungs.  However, you should not engage in any heavy lifting (> 10-15 lbs), strenuous activity, or straining. 2. Diet: You should continue a clear liquid diet until passing gas from below.  Once this occurs, you may advance your diet to a soft diet that would be easy to digest (i.e soups, scrambled eggs, mashed potatoes, etc.) for 24 hours just as you would if getting over a bad stomach flu.  If tolerating this diet well for 24 hours, you may then begin eating regular food.  It will be normal to have some amount of bloating, nausea, and abdominal discomfort intermittently. 3. Prescriptions:  You will be provided a prescription for pain medication to take as needed.  If your pain is not severe enough to require the prescription pain medication, you may take extra strength Tylenol instead.  You should also take an over the counter stool softener (Colace 100 mg twice daily) to avoid straining with bowel movements as the pain medication may constipate you. Finally, you will also be provided a prescription for an antibiotic to begin the day prior to your return visit in the office for catheter removal. 4. Catheter care: You will be taught how to take care of the catheter by the nursing staff prior to discharge from the hospital.  You may use both a leg bag and the larger bedside bag but it is recommended to at least use the bigger bedside bag at nighttime as the leg bag is small and will fill up overnight and also does not drain as well when lying flat. You may periodically feel a strong urge to void with the catheter in place.  This is a bladder spasm and most often can occur when having a bowel movement or when you are moving around. It is typically self-limited and usually will stop after a few minutes.  You may use some Vaseline or Neosporin around the tip of the catheter to reduce friction at the tip of the  penis. 5. Incisions: You may remove your dressing bandages the 2nd day after surgery.  You most likely will have a few small staples in each of the incisions and once the bandages are removed, the incisions may stay open to air.  You may start showering (not soaking or bathing in water) 48 hours after surgery and the incisions simply need to be patted dry after the shower.  No additional care is needed. 6. What to call us about: You should call the office [phone]) if you develop fever > 101, persistent vomiting, or the catheter stops draining. Also, feel free to call with any other questions you may have and remember the handout that was provided to you as a reference preoperatively which answers many of the common questions that arise after surgery. 7.   Discharge medications:    Medication List    STOP taking these medications        aspirin 325 MG tablet      TAKE these medications        acetaminophen 325 MG tablet  Commonly known as:  TYLENOL  Take 650 mg by mouth as needed.     cetirizine 10 MG tablet  Commonly known as:  ZYRTEC  Take 10 mg by mouth daily. As needed     docusate sodium 100 MG capsule  Commonly known as:  COLACE  Take 1 capsule (100 mg total) by mouth 2 (two) times daily.     HYDROcodone-acetaminophen 5-325 MG per tablet  Commonly known as:  NORCO/VICODIN  Take 1-2 tablets by mouth every 6 (six) hours as needed.     senna 8.6 MG Tabs tablet  Commonly known as:  SENOKOT  Take 1 tablet (8.6 mg total) by mouth daily.     sulfamethoxazole-trimethoprim 800-160 MG per tablet  Commonly known as:  BACTRIM DS  Take 1 tablet by mouth 2 (two) times daily.        Followup:  Follow-up Information    Follow up with Dutch Gray, MD.   Specialty:  Urology   Why:  as scheduled   Contact information:   [address] [place] [address] [phone]

## 2014-08-17 NOTE — Anesthesia Procedure Notes (Signed)
Procedure Name: Intubation Date/Time: 08/17/2014 12:39 PM Performed by: Tyrin Herbers, Virgel Gess Pre-anesthesia Checklist: Patient identified, Emergency Drugs available, Suction available, Patient being monitored and Timeout performed Patient Re-evaluated:Patient Re-evaluated prior to inductionOxygen Delivery Method: Circle system utilized Preoxygenation: Pre-oxygenation with 100% oxygen Intubation Type: IV induction Ventilation: Mask ventilation without difficulty Laryngoscope Size: Mac and 4 Grade View: Grade III Tube type: Oral Tube size: 7.5 mm Number of attempts: 1 Airway Equipment and Method: Stylet Placement Confirmation: ETT inserted through vocal cords under direct vision,  positive ETCO2,  CO2 detector and breath sounds checked- equal and bilateral Secured at: 23 cm Tube secured with: Tape Dental Injury: Teeth and Oropharynx as per pre-operative assessment  Difficulty Due To: Difficult Airway- due to anterior larynx, Difficult Airway- due to large tongue and Difficult Airway- due to reduced neck mobility Future Recommendations: Recommend- induction with short-acting agent, and alternative techniques readily available

## 2014-08-17 NOTE — Progress Notes (Signed)
Patient ID: John Griffin, male   DOB: [birth year], 64 y.o.   MRN: [id number]  Post-op note  Subjective: The patient is doing well.  No complaints.  Objective: Vital signs in last 24 hours: Temp:  [98.1 F (36.7 C)-98.5 F (36.9 C)] 98.5 F (36.9 C) (07/28 1645) Pulse Rate:  [100-125] 125 (07/28 1645) Resp:  [20-23] 23 (07/28 1645) BP: (135)/(82) 135/82 mmHg (07/28 0845) SpO2:  [95 %] 95 % (07/28 1645) Weight:  [146.965 kg (324 lb)] 146.965 kg (324 lb) (07/28 0859)  Intake/Output from previous day:   Intake/Output this shift: Total I/O In: 2750 [I.V.:2750] Out: 310 [Urine:200; Drains:60; Blood:50]  Physical Exam:  General: Alert and oriented. Abdomen: Soft, Nondistended. Incisions: Clean and dry.  Lab Results:  Recent Labs  08/17/14 1700  HGB 15.7  HCT 46.3    Assessment/Plan: POD#0   1) Ambulate at least twice tonight, IS   Pryor Curia. MD   LOS: 0 days   Nailyn Dearinger,LES 08/17/2014, 6:14 PM

## 2014-08-17 NOTE — Transfer of Care (Signed)
Immediate Anesthesia Transfer of Care Note  Patient: John Griffin  Procedure(s) Performed: Procedure(s): ROBOTIC ASSISTED LAPAROSCOPIC RADICAL PROSTATECTOMY LEVEL 3  AND UMBILICAL HERNIA REPAIR (N/A) LYMPHADENECTOMY (Bilateral)  Patient Location: PACU  Anesthesia Type:General  Level of Consciousness: awake, alert  and oriented  Airway & Oxygen Therapy: Patient Spontanous Breathing and Patient connected to face mask oxygen  Post-op Assessment: Report given to RN and Post -op Vital signs reviewed and stable  Post vital signs: Reviewed and stable  Last Vitals:  Filed Vitals:   08/17/14 0845  BP: 135/82  Pulse: 100  Temp: 36.7 C  Resp: 20    Complications: No apparent anesthesia complications

## 2014-08-17 NOTE — Op Note (Signed)
Preoperative diagnosis: Clinically localized adenocarcinoma of the prostate (clinical stage L3Y Nx Mx), Umbilical hernia  Postoperative diagnosis: Clinically localized adenocarcinoma of the prostate (clinical stage B0F Nx Mx), Umbilical hernia  Procedure:  1. Robotic assisted laparoscopic radical prostatectomy (right nerve sparing) 2. Bilateral robotic assisted laparoscopic pelvic lymphadenectomy 3. Umbilical hernia repair  Surgeon: Pryor Curia. M.D.  Assistant(s): Debbrah Alar, PA-C  Resident: Dr. Verdis Frederickson  Anesthesia: General  Complications: None  EBL:  50 mL  IVF:  2000 mL crystalloid  Specimens: 1. Prostate and seminal vesicles 2. Right pelvic lymph nodes 3. Left pelvic lymph nodes 4. Posterior apical margin  Disposition of specimens: Pathology  Drains: 1. 20 Fr coude catheter 2. # 19 Blake pelvic drain  Indication: John Griffin is a 64 y.o. patient with clinically localized prostate cancer and an umbilical hernia.  After a thorough review of the management options for treatment of prostate cancer, he elected to proceed with surgical therapy and the above procedure(s).  We have discussed the potential benefits and risks of the procedure, side effects of the proposed treatment, the likelihood of the patient achieving the goals of the procedure, and any potential problems that might occur during the procedure or recuperation. Informed consent has been obtained.  Description of procedure:  The patient was taken to the operating room and a general anesthetic was administered. He was given preoperative antibiotics, placed in the dorsal lithotomy position, and prepped and draped in the usual sterile fashion. Next a preoperative timeout was performed. A urethral catheter was placed into the bladder and a site was selected near the umbilicus for placement of the camera port. He was noted to have a very large umbilical hernia.  The incision was made just superior to the  hernia site. This was placed using a standard open Hassan technique which allowed entry into the peritoneal cavity under direct vision and without difficulty. I digitally palpated the surface of the peritoneum.  There was noted to be omentum that was incarcerated within the umbilical hernia. A 12 mm port was placed and a pneumoperitoneum established. The camera was then used to inspect the abdomen and there was no evidence of any intra-abdominal injuries or other abnormalities. The remaining abdominal ports were then placed. 8 mm robotic ports were placed in the right lower quadrant, left lower quadrant, and far left lateral abdominal wall. A 5 mm port was placed in the right upper quadrant and a 12 mm port was placed in the right lateral abdominal wall for laparoscopic assistance. All ports were placed under direct vision without difficulty. The camera was placed in the right lateral 12 mm port to examine the umbilical hernia and associated incarcerated omentum.  No bowel was noted in this area and it was confirmed that this was only omentum.  We elected to address this at the end of the procedure. The surgical cart was then docked.   Utilizing the cautery scissors, the bladder was reflected posteriorly allowing entry into the space of Retzius and identification of the endopelvic fascia and prostate. The periprostatic fat was then removed from the prostate allowing full exposure of the endopelvic fascia. The endopelvic fascia was then incised from the apex back to the base of the prostate bilaterally and the underlying levator muscle fibers were swept laterally off the prostate thereby isolating the dorsal venous complex. The dorsal vein was then stapled and divided with a 45 mm Flex Echelon stapler. Attention then turned to the bladder neck which was divided  anteriorly thereby allowing entry into the bladder and exposure of the urethral catheter. The catheter balloon was deflated and the catheter was brought  into the operative field and used to retract the prostate anteriorly. The posterior bladder neck was then examined and was divided allowing further dissection between the bladder and prostate posteriorly until the vasa deferentia and seminal vessels were identified. The vasa deferentia were isolated, divided, and lifted anteriorly. The seminal vesicles were dissected down to their tips with care to control the seminal vascular arterial blood supply. These structures were then lifted anteriorly and the space between Denonvillier's fascia and the anterior rectum was developed with a combination of sharp and blunt dissection. This isolated the vascular pedicles of the prostate.  The lateral prostatic fascia on the right side of the prostate was then sharply incised allowing release of the neurovascular bundle. The vascular pedicle of the prostate on the right side was then ligated with Weck clips between the prostate and neurovascular bundle and divided with sharp cold scissor dissection resulting in neurovascular bundle preservation. On the left side, a wide non nerve sparing dissection was performed with Weck clips used to ligate the vascular pedicle of the prostate. The neurovascular bundle on the right side was then separated off the apex of the prostate and urethra.  The urethra was then sharply transected allowing the prostate specimen to be disarticulated. The pelvis was copiously irrigated and hemostasis was ensured. There was no evidence for rectal injury. There was noted be a small amount of abnormal appearing tissue in the vicinity of the posterior apex of the prostate.  This was noted on the anterior rectal surface.  This was sharply dissected free from the anterior rectum and removed and sent as a separate permanent specimen.  Attention then turned to the right pelvic sidewall. The fibrofatty tissue between the external iliac vein, confluence of the iliac vessels, hypogastric artery, and Cooper's  ligament was dissected free from the pelvic sidewall with care to preserve the obturator nerve. Weck clips were used for lymphostasis and hemostasis. An identical procedure was performed on the contralateral side and the lymphatic packets were removed for permanent pathologic analysis.  Attention then turned to the urethral anastomosis. A 2-0 Vicryl slip knot was placed between Denonvillier's fascia, the posterior bladder neck, and the posterior urethra to reapproximate these structures. A double-armed 3-0 Monocryl suture was then used to perform a 360 running tension-free anastomosis between the bladder neck and urethra. A new urethral catheter was then placed into the bladder and irrigated. There were no blood clots within the bladder and the anastomosis appeared to be watertight. A #19 Blake drain was then brought through the left lateral 8 mm port site and positioned appropriately within the pelvis. It was secured to the skin with a nylon suture. The surgical cart was then undocked. The right lateral 12 mm port site was closed at the fascial level with a 0 Vicryl suture placed laparoscopically. All remaining ports were then removed under direct vision. The prostate specimen was removed intact within the Endopouch retrieval bag via the periumbilical camera port site.   At this point, an incision was extended from the periumbilical incision down to the right side and around the hernia sac and umbilicus.  This was carried down through the subcutaneous tissues and fascia with electrocautery.  The incarcerated omentum was identified and was gently retracted with a Kelly clamp.  Using a combination of electrocautery and sharp dissection, the omentum was freed from the umbilical  hernia and placed back into the abdomen.  The hernia sac was then opened in its entirety and extended into the supraumbilical incision site.  Kocher clamps were then placed on the fascia bilaterally.  Interrupted #1 Novafil sutures were  then placed.  These were then sequentially tied down to close the fascial defect.  The defect was felt to be intact.  An additional Vicryl suture was used to close the subcutaneous tissues.  Some of the umbilical skin was excised to allow proper closure and healing of the skin tissue.  4-0 Monocryl suture was then used to perform a subcuticular closure.   0.25% Marcaine was then injected into all port sites and all incisions were reapproximated at the skin level with 4-0 Monocryl subcuticular sutures. Dermabond was applied. The patient appeared to tolerate the procedure well and without complications. The patient was able to be extubated and transferred to the recovery unit in satisfactory condition.   Pryor Curia MD

## 2014-08-17 NOTE — Anesthesia Preprocedure Evaluation (Addendum)
Anesthesia Evaluation  Patient identified by MRN, date of birth, ID band Patient awake    Reviewed: Allergy & Precautions, NPO status , Patient's Chart, lab work & pertinent test results  Airway Mallampati: III  TM Distance: >3 FB Neck ROM: Full    Dental  (+) Edentulous Upper,    Pulmonary Current Smoker,  breath sounds clear to auscultation  Pulmonary exam normal       Cardiovascular Exercise Tolerance: Good negative cardio ROS Normal cardiovascular examRhythm:Regular Rate:Normal     Neuro/Psych negative neurological ROS  negative psych ROS   GI/Hepatic negative GI ROS, Neg liver ROS,   Endo/Other  Morbid obesity  Renal/GU negative Renal ROS  negative genitourinary   Musculoskeletal  (+) Arthritis -, Osteoarthritis,    Abdominal (+) + obese,   Peds negative pediatric ROS (+)  Hematology negative hematology ROS (+)   Anesthesia Other Findings   Reproductive/Obstetrics negative OB ROS                           Anesthesia Physical Anesthesia Plan  ASA: II  Anesthesia Plan: General   Post-op Pain Management:    Induction: Intravenous  Airway Management Planned: Oral ETT  Additional Equipment:   Intra-op Plan:   Post-operative Plan: Extubation in OR  Informed Consent: I have reviewed the patients History and Physical, chart, labs and discussed the procedure including the risks, benefits and alternatives for the proposed anesthesia with the patient or authorized representative who has indicated his/her understanding and acceptance.   Dental advisory given  Plan Discussed with: CRNA  Anesthesia Plan Comments:         Anesthesia Quick Evaluation

## 2014-08-17 NOTE — Anesthesia Postprocedure Evaluation (Signed)
Anesthesia Post Note  Patient: John Griffin  Procedure(s) Performed: Procedure(s) (LRB): ROBOTIC ASSISTED LAPAROSCOPIC RADICAL PROSTATECTOMY LEVEL 3  AND UMBILICAL HERNIA REPAIR (N/A) LYMPHADENECTOMY (Bilateral)  Anesthesia type: general  Patient location: PACU  Post pain: Pain level controlled  Post assessment: Patient's Cardiovascular Status Stable  Last Vitals:  Filed Vitals:   08/17/14 1645  BP:   Pulse: 125  Temp: 36.9 C  Resp: 23    Post vital signs: Reviewed and stable  Level of consciousness: sedated  Complications: No apparent anesthesia complications

## 2014-08-18 ENCOUNTER — Encounter (HOSPITAL_COMMUNITY): Payer: Self-pay | Admitting: Urology

## 2014-08-18 LAB — HEMOGLOBIN AND HEMATOCRIT, BLOOD
HCT: 45.5 % (ref 39.0–52.0)
Hemoglobin: 15.2 g/dL (ref 13.0–17.0)

## 2014-08-18 MED ORDER — BISACODYL 10 MG RE SUPP
10.0000 mg | Freq: Once | RECTAL | Status: AC
  Administered 2014-08-18: 10 mg via RECTAL
  Filled 2014-08-18: qty 1

## 2014-08-18 MED ORDER — HYDROCODONE-ACETAMINOPHEN 5-325 MG PO TABS
1.0000 | ORAL_TABLET | Freq: Four times a day (QID) | ORAL | Status: DC | PRN
Start: 2014-08-18 — End: 2014-08-18
  Administered 2014-08-18: 2 via ORAL
  Filled 2014-08-18: qty 2

## 2014-08-18 NOTE — Progress Notes (Signed)
1 Day Post-Op Subjective: The patient is doing well.  No nausea or vomiting. Pain is adequately controlled.  Objective: Vital signs in last 24 hours: Temp:  [97.7 F (36.5 C)-98.8 F (37.1 C)] 98.2 F (36.8 C) (07/29 0452) Pulse Rate:  [94-125] 94 (07/29 0452) Resp:  [15-25] 22 (07/29 0452) BP: (133-170)/(73-130) 135/77 mmHg (07/29 0452) SpO2:  [92 %-98 %] 96 % (07/29 0452) Weight:  [146.965 kg (324 lb)] 146.965 kg (324 lb) (07/28 0859)  Intake/Output from previous day: 07/28 0701 - 07/29 0700 In: 4005 [P.O.:760; I.V.:3195; IV Piggyback:50] Out: 2330 [Urine:2200; Drains:80; Blood:50] Intake/Output this shift:    Physical Exam:  General: Alert and oriented. CV: RRR Lungs: Clear bilaterally. GI: Soft, Nondistended. Abdominal binder in place, some bruising around umbilical hernia site. Incisions: Clean, dry, and intact, JP serosang Urine: Clear Extremities: Nontender, no erythema, no edema.  Lab Results:  Recent Labs  08/17/14 1700 08/18/14 0506  HGB 15.7 15.2  HCT 46.3 45.5      Assessment/Plan: POD# 1 s/p robotic prostatectomy with BPLND and primary repair of umbilical hernia.  1) SL IVF 2) Ambulate, Incentive spirometry 3) Transition to oral pain medication 4) Dulcolax suppository 5) D/C pelvic drain 6) Plan for likely discharge later today 7) Continue abdominal binder   LOS: 1 day   Star Age 08/18/2014, 7:10 AM

## 2014-08-18 NOTE — Discharge Instructions (Signed)

## 2014-09-03 ENCOUNTER — Emergency Department (HOSPITAL_BASED_OUTPATIENT_CLINIC_OR_DEPARTMENT_OTHER): Payer: BC Managed Care – PPO

## 2014-09-03 ENCOUNTER — Encounter (HOSPITAL_COMMUNITY): Payer: Self-pay | Admitting: Emergency Medicine

## 2014-09-03 ENCOUNTER — Emergency Department (HOSPITAL_COMMUNITY)
Admission: EM | Admit: 2014-09-03 | Discharge: 2014-09-03 | Disposition: A | Discharge status: Home / Self Care | Payer: BC Managed Care – PPO | Attending: Emergency Medicine | Admitting: Emergency Medicine

## 2014-09-03 DIAGNOSIS — R2242 Localized swelling, mass and lump, left lower limb: Secondary | ICD-10-CM | POA: Diagnosis not present

## 2014-09-03 DIAGNOSIS — Z8546 Personal history of malignant neoplasm of prostate: Secondary | ICD-10-CM | POA: Diagnosis not present

## 2014-09-03 DIAGNOSIS — M1990 Unspecified osteoarthritis, unspecified site: Secondary | ICD-10-CM | POA: Diagnosis not present

## 2014-09-03 DIAGNOSIS — M7989 Other specified soft tissue disorders: Secondary | ICD-10-CM

## 2014-09-03 DIAGNOSIS — Z79899 Other long term (current) drug therapy: Secondary | ICD-10-CM | POA: Insufficient documentation

## 2014-09-03 DIAGNOSIS — Z7982 Long term (current) use of aspirin: Secondary | ICD-10-CM | POA: Diagnosis not present

## 2014-09-03 DIAGNOSIS — M79609 Pain in unspecified limb: Secondary | ICD-10-CM | POA: Diagnosis not present

## 2014-09-03 DIAGNOSIS — Z720 Tobacco use: Secondary | ICD-10-CM | POA: Diagnosis not present

## 2014-09-03 LAB — CBC WITH DIFFERENTIAL/PLATELET
Basophils Absolute: 0 10*3/uL (ref 0.0–0.1)
Basophils Relative: 1 % (ref 0–1)
Eosinophils Absolute: 0.4 10*3/uL (ref 0.0–0.7)
Eosinophils Relative: 4 % (ref 0–5)
HEMATOCRIT: 46.5 % (ref 39.0–52.0)
Hemoglobin: 16.2 g/dL (ref 13.0–17.0)
LYMPHS PCT: 18 % (ref 12–46)
Lymphs Abs: 1.6 10*3/uL (ref 0.7–4.0)
MCH: 32.7 pg (ref 26.0–34.0)
MCHC: 34.8 g/dL (ref 30.0–36.0)
MCV: 93.8 fL (ref 78.0–100.0)
Monocytes Absolute: 1 10*3/uL (ref 0.1–1.0)
Monocytes Relative: 11 % (ref 3–12)
Neutro Abs: 5.9 10*3/uL (ref 1.7–7.7)
Neutrophils Relative %: 66 % (ref 43–77)
Platelets: 225 10*3/uL (ref 150–400)
RBC: 4.96 MIL/uL (ref 4.22–5.81)
RDW: 12.8 % (ref 11.5–15.5)
WBC: 8.8 10*3/uL (ref 4.0–10.5)

## 2014-09-03 LAB — COMPREHENSIVE METABOLIC PANEL
ALT: 50 U/L (ref 17–63)
ANION GAP: 8 (ref 5–15)
AST: 40 U/L (ref 15–41)
Albumin: 3.6 g/dL (ref 3.5–5.0)
Alkaline Phosphatase: 147 U/L — ABNORMAL HIGH (ref 38–126)
BILIRUBIN TOTAL: 0.7 mg/dL (ref 0.3–1.2)
BUN: 13 mg/dL (ref 6–20)
CALCIUM: 9.1 mg/dL (ref 8.9–10.3)
CHLORIDE: 103 mmol/L (ref 101–111)
CO2: 28 mmol/L (ref 22–32)
Creatinine, Ser: 0.63 mg/dL (ref 0.61–1.24)
Glucose, Bld: 118 mg/dL — ABNORMAL HIGH (ref 65–99)
POTASSIUM: 3.9 mmol/L (ref 3.5–5.1)
Sodium: 139 mmol/L (ref 135–145)
TOTAL PROTEIN: 7.1 g/dL (ref 6.5–8.1)

## 2014-09-03 NOTE — Progress Notes (Signed)
VASCULAR LAB PRELIMINARY  PRELIMINARY  PRELIMINARY  PRELIMINARY  Left Lower Ext. Venous Duplex Completed. No evidence of deep or superficial vein thrombosis in the left lower extremity. However, sluggish flow is noted throughout the common femoral and femoral veins  [name], Bonnye Fava, RVT 09/03/2014, 11:22 AM

## 2014-09-03 NOTE — ED Provider Notes (Signed)
CSN: [id number]     Arrival date & time 09/03/14  3151 History   First MD Initiated Contact with Patient 09/03/14 [phone]     Chief Complaint  Patient presents with  . Leg Swelling     (Consider location/radiation/quality/duration/timing/severity/associated sxs/prior Treatment) HPI Comments: 64 year old male with past medical history including recent prostate cancer resection who presents with left leg swelling. The patient had prostate cancer resection approximately 2 weeks ago and has been recovering well at home. 2 days ago, he began noticing mild left leg swelling which has worsened over the past day. He denies any leg pain but has noticed swelling from his ankle all the way through his thigh. He denies any fevers. He has chronic shortness of breath because he is a smoker but he denies any change in his breathing and denies any episodes of chest pain. No vomiting, diarrhea, or change in his postoperative abdominal pain. No history of blood clots.   The history is provided by the patient.    Past Medical History  Diagnosis Date  . Arthritis     hands   . Urinary frequency    Past Surgical History  Procedure Laterality Date  . Bone graft right ankle   1972  . Right shoulder surgery   1992   . Robot assisted laparoscopic radical prostatectomy N/A 08/17/2014    Procedure: ROBOTIC ASSISTED LAPAROSCOPIC RADICAL PROSTATECTOMY LEVEL 3  AND UMBILICAL HERNIA REPAIR;  Surgeon: Raynelle Bring, MD;  Location: [facility];  Service: Urology;  Laterality: N/A;  . Lymphadenectomy Bilateral 08/17/2014    Procedure: LYMPHADENECTOMY;  Surgeon: Raynelle Bring, MD;  Location: [facility];  Service: Urology;  Laterality: Bilateral;   History reviewed. No pertinent family history. Social History  Substance Use Topics  . Smoking status: Current Every Day Smoker -- 1.00 packs/day for 40 years    Types: Cigarettes  . Smokeless tobacco: Never Used  . Alcohol Use: Yes     Comment: occasional     Review of Systems 10  Systems reviewed and are negative for acute change except as noted in the HPI.    Allergies  Review of patient's allergies indicates no known allergies.  Home Medications   Prior to Admission medications   Medication Sig Start Date End Date Taking? Authorizing Provider  aspirin EC 325 MG tablet Take 650 mg by mouth every morning.   Yes Historical Provider, MD  cetirizine (ZYRTEC) 10 MG tablet Take 10 mg by mouth daily. As needed   Yes Historical Provider, MD  docusate sodium (COLACE) 100 MG capsule Take 1 capsule (100 mg total) by mouth 2 (two) times daily. 08/17/14  Yes Star Age, MD  ibuprofen (ADVIL,MOTRIN) 200 MG tablet Take 600 mg by mouth every 6 (six) hours as needed for moderate pain.   Yes Historical Provider, MD  senna (SENOKOT) 8.6 MG TABS tablet Take 1 tablet (8.6 mg total) by mouth daily. 08/17/14  Yes Star Age, MD  HYDROcodone-acetaminophen (NORCO/VICODIN) 5-325 MG per tablet Take 1-2 tablets by mouth every 6 (six) hours as needed. Patient not taking: Reported on 09/03/2014 08/17/14   Star Age, MD  sulfamethoxazole-trimethoprim (BACTRIM DS) 800-160 MG per tablet Take 1 tablet by mouth 2 (two) times daily. Patient not taking: Reported on 09/03/2014 08/17/14   Star Age, MD   BP 146/79 mmHg  Pulse 99  Temp(Src) 98.1 F (36.7 C) (Oral)  Resp 18  SpO2 99% Physical Exam  Constitutional: He is oriented to person, place, and time. He appears well-developed and  well-nourished. No distress.  HENT:  Head: Normocephalic and atraumatic.  Moist mucous membranes  Eyes: Conjunctivae are normal. Pupils are equal, round, and reactive to light.  Neck: Neck supple.  Cardiovascular: Normal rate, regular rhythm and normal heart sounds.   No murmur heard. Pulmonary/Chest:  Prolonged expiratory phase with faint wheezes in bilateral bases, diminished breath sounds; mildly increased WOB  Abdominal: Soft. Bowel sounds are normal. He exhibits no distension. There is no  tenderness.  Musculoskeletal:  2+ pitting edema to thigh on L; 2+ DP pulses b/l  Neurological: He is alert and oriented to person, place, and time.  Fluent speech  Skin: Skin is warm and dry.  Psychiatric: He has a normal mood and affect. Judgment normal.  pleasant  Nursing note and vitals reviewed.   ED Course  Procedures (including critical care time) Labs Review Labs Reviewed  COMPREHENSIVE METABOLIC PANEL - Abnormal; Notable for the following:    Glucose, Bld 118 (*)    Alkaline Phosphatase 147 (*)    All other components within normal limits  CBC WITH DIFFERENTIAL/PLATELET    Imaging Review No results found.   EKG Interpretation None      MDM   Final diagnoses:  Leg swelling    64 year old male who is several weeks postoperative from prostate cancer surgery who presents with 1 day of leg swelling without associated pain. At presentation, the patient was awake, alert, and in no acute distress. Vital signs stable. His leg was edematous but nontender to palpation. Obtained labs listed above which did not show any leukocytosis or other evidence of infection. Kidney function normal and no evidence of liver dysfunction. Obtained lower extremity ultrasound which was negative for DVT but did show sluggish blood flow through common femoral and femoral veins. The patient's edema may be due to venous insufficiency but no evidence of clots. On reexamination, the patient continues to deny any chest pain or change in his breathing. Because the patient is postoperative, I have recommended a repeat ultrasound in 1 week which he states he will contact his PCP or surgeon to arrange. I see no evidence of cellulitis and the patient continues to deny any pain. He has been ambulatory since surgery and I have encouraged him to continue ambulating and using compression stockings. I have fever. Return precautions with the patient and his wife, including shortness of breath, chest pain, leg pain, or  leg erythema/warmth. They have voiced understanding and will follow up for ultrasound in 1 week.    Sharlett Iles, MD 09/03/14 [phone]

## 2014-09-03 NOTE — ED Notes (Signed)
Delay on urine sample. Per RN Taquita, patient is incontinent and just had prostate surgery. MD Little just walked into room.

## 2014-09-03 NOTE — Discharge Instructions (Signed)
**   IT IS VERY IMPORTANT FOR YOU TO FOLLOW UP WITH SURGEON OR PRIMARY CARE PROVIDER FOR A REPEAT ULTRASOUND IN 1 WEEK. PLEASE SEEK IMMEDIATE MEDICAL ATTENTION FOR ANY SUDDEN CHEST PAIN OR SHORTNESS OF BREATH**

## 2014-09-03 NOTE — ED Notes (Addendum)
Pt reports L leg swelling that started last night. Hx of abd surgery 2 weeks ago. No injury. Pt reports his SOB from smoking is the same as usual, no recent deterioration

## 2014-09-03 NOTE — ED Notes (Signed)
Vascular at bedside

## 2014-09-03 NOTE — ED Notes (Signed)
Patient reports that he is incontinent of urine and willnot be able to give sample. Also reports recent prostate surgery and does not want to be cath.

## 2015-02-23 DIAGNOSIS — R05 Cough: Secondary | ICD-10-CM | POA: Diagnosis not present

## 2015-02-23 DIAGNOSIS — J159 Unspecified bacterial pneumonia: Secondary | ICD-10-CM | POA: Diagnosis not present

## 2015-02-23 DIAGNOSIS — R0602 Shortness of breath: Secondary | ICD-10-CM | POA: Diagnosis not present

## 2015-02-23 DIAGNOSIS — Z716 Tobacco abuse counseling: Secondary | ICD-10-CM | POA: Diagnosis not present

## 2015-02-23 DIAGNOSIS — J441 Chronic obstructive pulmonary disease with (acute) exacerbation: Secondary | ICD-10-CM | POA: Diagnosis not present

## 2015-02-24 ENCOUNTER — Inpatient Hospital Stay (HOSPITAL_COMMUNITY)
Admission: EM | Admit: 2015-02-24 | Discharge: 2015-03-02 | DRG: 190 | Disposition: A | Discharge status: Home / Self Care | Payer: Medicare Other | Attending: Internal Medicine | Admitting: Internal Medicine

## 2015-02-24 ENCOUNTER — Encounter (HOSPITAL_COMMUNITY): Payer: Self-pay | Admitting: Emergency Medicine

## 2015-02-24 ENCOUNTER — Other Ambulatory Visit: Payer: Self-pay

## 2015-02-24 ENCOUNTER — Emergency Department (HOSPITAL_COMMUNITY): Payer: Medicare Other

## 2015-02-24 DIAGNOSIS — Z8546 Personal history of malignant neoplasm of prostate: Secondary | ICD-10-CM

## 2015-02-24 DIAGNOSIS — R6510 Systemic inflammatory response syndrome (SIRS) of non-infectious origin without acute organ dysfunction: Secondary | ICD-10-CM | POA: Diagnosis not present

## 2015-02-24 DIAGNOSIS — I10 Essential (primary) hypertension: Secondary | ICD-10-CM | POA: Diagnosis not present

## 2015-02-24 DIAGNOSIS — J441 Chronic obstructive pulmonary disease with (acute) exacerbation: Secondary | ICD-10-CM | POA: Diagnosis present

## 2015-02-24 DIAGNOSIS — Z7982 Long term (current) use of aspirin: Secondary | ICD-10-CM

## 2015-02-24 DIAGNOSIS — E669 Obesity, unspecified: Secondary | ICD-10-CM | POA: Diagnosis present

## 2015-02-24 DIAGNOSIS — N393 Stress incontinence (female) (male): Secondary | ICD-10-CM | POA: Diagnosis present

## 2015-02-24 DIAGNOSIS — R05 Cough: Secondary | ICD-10-CM | POA: Diagnosis not present

## 2015-02-24 DIAGNOSIS — A419 Sepsis, unspecified organism: Secondary | ICD-10-CM | POA: Diagnosis present

## 2015-02-24 DIAGNOSIS — D72829 Elevated white blood cell count, unspecified: Secondary | ICD-10-CM | POA: Diagnosis present

## 2015-02-24 DIAGNOSIS — R0602 Shortness of breath: Secondary | ICD-10-CM | POA: Diagnosis not present

## 2015-02-24 DIAGNOSIS — Z6839 Body mass index (BMI) 39.0-39.9, adult: Secondary | ICD-10-CM | POA: Diagnosis not present

## 2015-02-24 DIAGNOSIS — J209 Acute bronchitis, unspecified: Secondary | ICD-10-CM | POA: Diagnosis not present

## 2015-02-24 DIAGNOSIS — Z23 Encounter for immunization: Secondary | ICD-10-CM

## 2015-02-24 DIAGNOSIS — R000 Tachycardia, unspecified: Secondary | ICD-10-CM | POA: Diagnosis not present

## 2015-02-24 DIAGNOSIS — R0902 Hypoxemia: Secondary | ICD-10-CM

## 2015-02-24 DIAGNOSIS — R062 Wheezing: Secondary | ICD-10-CM | POA: Diagnosis not present

## 2015-02-24 DIAGNOSIS — F419 Anxiety disorder, unspecified: Secondary | ICD-10-CM | POA: Diagnosis not present

## 2015-02-24 DIAGNOSIS — R609 Edema, unspecified: Secondary | ICD-10-CM

## 2015-02-24 DIAGNOSIS — J9601 Acute respiratory failure with hypoxia: Secondary | ICD-10-CM | POA: Diagnosis present

## 2015-02-24 DIAGNOSIS — J440 Chronic obstructive pulmonary disease with acute lower respiratory infection: Secondary | ICD-10-CM | POA: Diagnosis not present

## 2015-02-24 DIAGNOSIS — F17210 Nicotine dependence, cigarettes, uncomplicated: Secondary | ICD-10-CM | POA: Diagnosis present

## 2015-02-24 DIAGNOSIS — R0609 Other forms of dyspnea: Secondary | ICD-10-CM | POA: Diagnosis not present

## 2015-02-24 DIAGNOSIS — R509 Fever, unspecified: Secondary | ICD-10-CM | POA: Diagnosis not present

## 2015-02-24 DIAGNOSIS — Z720 Tobacco use: Secondary | ICD-10-CM | POA: Diagnosis present

## 2015-02-24 HISTORY — DX: Malignant neoplasm of prostate: C61

## 2015-02-24 HISTORY — DX: Umbilical hernia without obstruction or gangrene: K42.9

## 2015-02-24 HISTORY — DX: Tobacco use: Z72.0

## 2015-02-24 LAB — CBC WITH DIFFERENTIAL/PLATELET
Basophils Absolute: 0 10*3/uL (ref 0.0–0.1)
Basophils Relative: 0 %
Eosinophils Absolute: 0 10*3/uL (ref 0.0–0.7)
Eosinophils Relative: 0 %
HCT: 50.1 % (ref 39.0–52.0)
HEMOGLOBIN: 17.5 g/dL — AB (ref 13.0–17.0)
LYMPHS ABS: 1.2 10*3/uL (ref 0.7–4.0)
Lymphocytes Relative: 7 %
MCH: 32.9 pg (ref 26.0–34.0)
MCHC: 34.9 g/dL (ref 30.0–36.0)
MCV: 94.2 fL (ref 78.0–100.0)
Monocytes Absolute: 1.9 10*3/uL — ABNORMAL HIGH (ref 0.1–1.0)
Monocytes Relative: 11 %
NEUTROS PCT: 82 %
Neutro Abs: 14.3 10*3/uL — ABNORMAL HIGH (ref 1.7–7.7)
Platelets: 186 10*3/uL (ref 150–400)
RBC: 5.32 MIL/uL (ref 4.22–5.81)
RDW: 13.1 % (ref 11.5–15.5)
WBC: 17.4 10*3/uL — ABNORMAL HIGH (ref 4.0–10.5)

## 2015-02-24 LAB — COMPREHENSIVE METABOLIC PANEL
ALT: 51 U/L (ref 17–63)
AST: 48 U/L — AB (ref 15–41)
Albumin: 3.7 g/dL (ref 3.5–5.0)
Alkaline Phosphatase: 139 U/L — ABNORMAL HIGH (ref 38–126)
Anion gap: 11 (ref 5–15)
BILIRUBIN TOTAL: 0.7 mg/dL (ref 0.3–1.2)
BUN: 13 mg/dL (ref 6–20)
CO2: 27 mmol/L (ref 22–32)
CREATININE: 0.71 mg/dL (ref 0.61–1.24)
Calcium: 9.9 mg/dL (ref 8.9–10.3)
Chloride: 104 mmol/L (ref 101–111)
GFR calc Af Amer: >60 mL/min (ref >60)
GFR calc non Af Amer: >60 mL/min (ref >60)
GLUCOSE: 144 mg/dL — AB (ref 65–99)
POTASSIUM: 4.2 mmol/L (ref 3.5–5.1)
Sodium: 142 mmol/L (ref 135–145)
Total Protein: 8 g/dL (ref 6.5–8.1)

## 2015-02-24 LAB — I-STAT CG4 LACTIC ACID, ED
LACTIC ACID, VENOUS: 2.12 mmol/L — AB (ref 0.5–2.0)
Lactic Acid, Venous: 2.31 mmol/L (ref 0.5–2.0)

## 2015-02-24 LAB — INFLUENZA PANEL BY PCR (TYPE A & B)
H1N1 flu by pcr: NOT DETECTED
Influenza A By PCR: NEGATIVE
Influenza B By PCR: NEGATIVE

## 2015-02-24 LAB — TSH: TSH: 1.644 u[IU]/mL (ref 0.350–4.500)

## 2015-02-24 LAB — GLUCOSE, CAPILLARY: Glucose-Capillary: 187 mg/dL — ABNORMAL HIGH (ref 65–99)

## 2015-02-24 MED ORDER — DEXTROSE 5 % IV SOLN
1.0000 g | Freq: Once | INTRAVENOUS | Status: AC
  Administered 2015-02-24: 1 g via INTRAVENOUS
  Filled 2015-02-24: qty 10

## 2015-02-24 MED ORDER — SODIUM CHLORIDE 0.9 % IV SOLN
1000.0000 mL | INTRAVENOUS | Status: DC
  Administered 2015-02-24: 1000 mL via INTRAVENOUS

## 2015-02-24 MED ORDER — SODIUM CHLORIDE 0.9 % IV BOLUS (SEPSIS)
1000.0000 mL | Freq: Once | INTRAVENOUS | Status: AC
Start: 2015-02-24 — End: 2015-02-24
  Administered 2015-02-24: 1000 mL via INTRAVENOUS

## 2015-02-24 MED ORDER — ENOXAPARIN SODIUM 80 MG/0.8ML ~~LOC~~ SOLN
75.0000 mg | SUBCUTANEOUS | Status: DC
  Administered 2015-02-24 – 2015-03-01 (×6): 75 mg via SUBCUTANEOUS
  Filled 2015-02-24 (×5): qty 0.8

## 2015-02-24 MED ORDER — DEXTROSE 5 % IV SOLN
500.0000 mg | Freq: Once | INTRAVENOUS | Status: AC
  Administered 2015-02-24: 500 mg via INTRAVENOUS
  Filled 2015-02-24: qty 500

## 2015-02-24 MED ORDER — PSEUDOEPHEDRINE HCL 30 MG PO TABS
30.0000 mg | ORAL_TABLET | ORAL | Status: DC | PRN
  Filled 2015-02-24: qty 1

## 2015-02-24 MED ORDER — SODIUM CHLORIDE 0.9 % IV BOLUS (SEPSIS)
500.0000 mL | Freq: Once | INTRAVENOUS | Status: AC
  Administered 2015-02-24: 500 mL via INTRAVENOUS

## 2015-02-24 MED ORDER — BISACODYL 10 MG RE SUPP
10.0000 mg | Freq: Every day | RECTAL | Status: DC | PRN
Start: 2015-02-24 — End: 2015-03-02

## 2015-02-24 MED ORDER — SODIUM CHLORIDE 0.9% FLUSH
3.0000 mL | Freq: Two times a day (BID) | INTRAVENOUS | Status: DC
  Administered 2015-02-24 – 2015-03-02 (×11): 3 mL via INTRAVENOUS

## 2015-02-24 MED ORDER — ASPIRIN EC 325 MG PO TBEC
650.0000 mg | DELAYED_RELEASE_TABLET | Freq: Every morning | ORAL | Status: DC
  Administered 2015-02-24 – 2015-03-02 (×7): 650 mg via ORAL
  Filled 2015-02-24 (×6): qty 2

## 2015-02-24 MED ORDER — DEXTROSE 5 % IV SOLN
500.0000 mg | INTRAVENOUS | Status: DC
  Administered 2015-02-25 – 2015-02-26 (×2): 500 mg via INTRAVENOUS
  Filled 2015-02-24 (×4): qty 500

## 2015-02-24 MED ORDER — METHYLPREDNISOLONE SODIUM SUCC 125 MG IJ SOLR
60.0000 mg | Freq: Two times a day (BID) | INTRAMUSCULAR | Status: DC
  Administered 2015-02-24 – 2015-02-27 (×6): 60 mg via INTRAVENOUS
  Filled 2015-02-24 (×7): qty 2

## 2015-02-24 MED ORDER — FLEET ENEMA 7-19 GM/118ML RE ENEM: 1.0000 | ENEMA | Freq: Once | RECTAL | Status: DC | PRN

## 2015-02-24 MED ORDER — SODIUM CHLORIDE 0.9 % IV SOLN
1000.0000 mL | INTRAVENOUS | Status: DC
  Administered 2015-02-24 – 2015-02-25 (×2): 1000 mL via INTRAVENOUS

## 2015-02-24 MED ORDER — ONDANSETRON HCL 4 MG/2ML IJ SOLN: 4.0000 mg | Freq: Four times a day (QID) | INTRAMUSCULAR | Status: DC | PRN

## 2015-02-24 MED ORDER — HYDROCOD POLST-CPM POLST ER 10-8 MG/5ML PO SUER
5.0000 mL | Freq: Two times a day (BID) | ORAL | Status: DC
  Administered 2015-02-24 – 2015-03-02 (×12): 5 mL via ORAL
  Filled 2015-02-24 (×12): qty 5

## 2015-02-24 MED ORDER — ONDANSETRON HCL 4 MG PO TABS: 4.0000 mg | ORAL_TABLET | Freq: Four times a day (QID) | ORAL | Status: DC | PRN

## 2015-02-24 MED ORDER — LEVALBUTEROL HCL 0.63 MG/3ML IN NEBU
0.6300 mg | INHALATION_SOLUTION | RESPIRATORY_TRACT | Status: DC
  Administered 2015-02-24 – 2015-02-25 (×8): 0.63 mg via RESPIRATORY_TRACT
  Filled 2015-02-24 (×10): qty 3

## 2015-02-24 MED ORDER — IBUPROFEN 600 MG PO TABS: 600.0000 mg | ORAL_TABLET | Freq: Four times a day (QID) | ORAL | Status: DC | PRN

## 2015-02-24 MED ORDER — HYDROCODONE-ACETAMINOPHEN 5-325 MG PO TABS
1.0000 | ORAL_TABLET | ORAL | Status: DC | PRN
  Administered 2015-03-01: 1 via ORAL
  Filled 2015-02-24: qty 1

## 2015-02-24 MED ORDER — MORPHINE SULFATE (PF) 2 MG/ML IV SOLN: 1.0000 mg | INTRAVENOUS | Status: DC | PRN

## 2015-02-24 MED ORDER — GUAIFENESIN ER 600 MG PO TB12
600.0000 mg | ORAL_TABLET | Freq: Two times a day (BID) | ORAL | Status: DC
  Administered 2015-02-24 – 2015-03-02 (×12): 600 mg via ORAL
  Filled 2015-02-24 (×12): qty 1

## 2015-02-24 MED ORDER — SENNOSIDES-DOCUSATE SODIUM 8.6-50 MG PO TABS: 1.0000 | ORAL_TABLET | Freq: Every evening | ORAL | Status: DC | PRN

## 2015-02-24 MED ORDER — DEXTROSE 5 % IV SOLN
2.0000 g | INTRAVENOUS | Status: AC
  Administered 2015-02-25 – 2015-02-28 (×4): 2 g via INTRAVENOUS
  Filled 2015-02-24 (×5): qty 2

## 2015-02-24 MED ORDER — SODIUM CHLORIDE 0.9 % IV BOLUS (SEPSIS): 1500.0000 mL | Freq: Once | INTRAVENOUS | Status: DC

## 2015-02-24 MED ORDER — ACETAMINOPHEN 650 MG RE SUPP: 650.0000 mg | Freq: Four times a day (QID) | RECTAL | Status: DC | PRN

## 2015-02-24 MED ORDER — INSULIN ASPART 100 UNIT/ML ~~LOC~~ SOLN
0.0000 [IU] | Freq: Three times a day (TID) | SUBCUTANEOUS | Status: DC
  Administered 2015-02-25 (×2): 3 [IU] via SUBCUTANEOUS
  Administered 2015-02-25: 5 [IU] via SUBCUTANEOUS
  Administered 2015-02-26 (×2): 3 [IU] via SUBCUTANEOUS
  Administered 2015-02-27 – 2015-03-02 (×7): 2 [IU] via SUBCUTANEOUS

## 2015-02-24 MED ORDER — ACETAMINOPHEN 325 MG PO TABS: 650.0000 mg | ORAL_TABLET | Freq: Four times a day (QID) | ORAL | Status: DC | PRN

## 2015-02-24 MED ORDER — HYDROCODONE-ACETAMINOPHEN 5-325 MG PO TABS: 1.0000 | ORAL_TABLET | Freq: Four times a day (QID) | ORAL | Status: DC | PRN

## 2015-02-24 MED ORDER — INSULIN ASPART 100 UNIT/ML ~~LOC~~ SOLN
0.0000 [IU] | Freq: Every day | SUBCUTANEOUS | Status: DC
  Administered 2015-02-27: 2 [IU] via SUBCUTANEOUS

## 2015-02-24 MED ORDER — ALBUTEROL SULFATE (2.5 MG/3ML) 0.083% IN NEBU
5.0000 mg | INHALATION_SOLUTION | Freq: Once | RESPIRATORY_TRACT | Status: AC
  Administered 2015-02-24: 5 mg via RESPIRATORY_TRACT
  Filled 2015-02-24: qty 6

## 2015-02-24 MED ORDER — ALUM & MAG HYDROXIDE-SIMETH 200-200-20 MG/5ML PO SUSP: 30.0000 mL | Freq: Four times a day (QID) | ORAL | Status: DC | PRN

## 2015-02-24 NOTE — Progress Notes (Signed)
ANTIBIOTIC CONSULT NOTE - INITIAL  Pharmacy Consult for Ceftriaxone/Azithromycin Indication: sepsis  No Known Allergies  Patient Measurements: Height: 6\' 4"  (193 cm) Weight: (!) 324 lb 4 oz (147.079 kg) IBW/kg (Calculated) : 86.8  Vital Signs: Temp: 98.1 F (36.7 C) (02/04 1217) Temp Source: Oral (02/04 1217) BP: 131/113 mmHg (02/04 1217) Pulse Rate: 112 (02/04 1217)  Labs:  Recent Labs  02/24/15 1221  WBC 17.4*  HGB 17.5*  PLT 186   Medical History: Past Medical History  Diagnosis Date  . Arthritis     hands   . Urinary frequency    Medications:  Anti-infectives    Start     Dose/Rate Route Frequency Ordered Stop   02/24/15 1245  cefTRIAXone (ROCEPHIN) 1 g in dextrose 5 % 50 mL IVPB     1 g 100 mL/hr over 30 Minutes Intravenous  Once 02/24/15 1242     02/24/15 1245  azithromycin (ZITHROMAX) 500 mg in dextrose 5 % 250 mL IVPB     500 mg 250 mL/hr over 60 Minutes Intravenous  Once 02/24/15 1242       Assessment: 65yo male admitted with s/s of PNA and stated that he was diagnosed with this a few days ago but that symptoms have gotten worse.  He has an elevated WBC to 17.4 and a lactic acid of 2.31.  He is afebrile but with some respiratory distress.  He has been ordered to start IV antibiotics with Zithromax and Rocephin for sepsis/r/o PNA.  Allergy assessment:  I spoke with and confirmed that this patient has no known allergies.  Goal of Therapy:  Therapeutic response to IV antibiotics  Plan:  - Spoke with RN - to start IV ceftriaxone 1gm x 1 and Zithromax 500mg  x 1 stat.  Will repeat dose of Ceftriaxone for 2 gm. Total. -  Will continue IV Zithromax 500mg  daily -  Will increase dose of Ceftriaxone to 2gm daily given obesity.  Rober Minion, PharmD., MS Clinical Pharmacist Pager:  [phone] Thank you for allowing pharmacy to be part of this patients care team. 02/24/2015,1:00 PM

## 2015-02-24 NOTE — ED Notes (Signed)
MD at the bedside  

## 2015-02-24 NOTE — Progress Notes (Signed)
Pharmacy Code Sepsis Protocol  Time of code sepsis page: 12:47 [x]  Antibiotics delivered at 12:58PM (pulled from [facility] and given to RN) []  Antibiotics administered prior to code at (if checked, omit next 2 questions)  Were antibiotics ordered at the time of the code sepsis page? Yes (by MD) Was it required to contact the physician? [x]  Physician not contacted []  Physician contacted to order antibiotics for code sepsis []  Physician contacted to recommend changing antibiotics  Pharmacy consulted for: Ceftriaxone/Azithromycin  Anti-infectives    Start     Dose/Rate Route Frequency Ordered Stop   02/24/15 1245  cefTRIAXone (ROCEPHIN) 1 g in dextrose 5 % 50 mL IVPB     1 g 100 mL/hr over 30 Minutes Intravenous  Once 02/24/15 1242     02/24/15 1245  azithromycin (ZITHROMAX) 500 mg in dextrose 5 % 250 mL IVPB     500 mg 250 mL/hr over 60 Minutes Intravenous  Once 02/24/15 1242         Nurse education provided: []  Minutes left to administer antibiotics to achieve 1 hour goal [x]  Correct order of antibiotic administration []  Antibiotic Y-site compatibilities     Rober Minion, PharmD., MS Clinical Pharmacist Pager:  [phone] Thank you for allowing pharmacy to be part of this patients care team. 02/24/2015, 12:50 PM

## 2015-02-24 NOTE — Progress Notes (Deleted)
Patient transferred from [facility] to [facility]. Patient is in no distress at this time. RR is 15. Patient is sat 100% on 3L [place]. Patient is resting comfortably. BIPAP is not needed this time. RT will continue to monitor.

## 2015-02-24 NOTE — ED Provider Notes (Signed)
CSN: [id number]     Arrival date & time 02/24/15  1211 History   First MD Initiated Contact with Patient 02/24/15 1224     Chief Complaint  Patient presents with  . Pneumonia     (Consider location/radiation/quality/duration/timing/severity/associated sxs/prior Treatment) HPI Complains of cough productive of clear sputum onset 4 days ago accounted by fever with maximum temperature 101. Other associated symptoms include postnasal drip. Denies any vomiting. Denies other associated symptoms. Seen at urgent care yesterday, started on Levaquin he's had one dose also prescribed pro-air inhaler and received 2 "steroid injections" at urgent care center he presents today as he's feeling worse, meaning breathing is worse today, continues to cough. No other associated symptoms Past Medical History  Diagnosis Date  . Arthritis     hands   . Urinary frequency    Past Surgical History  Procedure Laterality Date  . Bone graft right ankle   1972  . Right shoulder surgery   1992   . Robot assisted laparoscopic radical prostatectomy N/A 08/17/2014    Procedure: ROBOTIC ASSISTED LAPAROSCOPIC RADICAL PROSTATECTOMY LEVEL 3  AND UMBILICAL HERNIA REPAIR;  Surgeon: Raynelle Bring, MD;  Location: [facility];  Service: Urology;  Laterality: N/A;  . Lymphadenectomy Bilateral 08/17/2014    Procedure: LYMPHADENECTOMY;  Surgeon: Raynelle Bring, MD;  Location: [facility];  Service: Urology;  Laterality: Bilateral;   No family history on file. Social History  Substance Use Topics  . Smoking status: Current Every Day Smoker -- 1.00 packs/day for 40 years    Types: Cigarettes  . Smokeless tobacco: Never Used  . Alcohol Use: Yes     Comment: occasional     Review of Systems  Constitutional: Positive for fever.  HENT: Positive for postnasal drip.   Respiratory: Positive for cough and shortness of breath.   Cardiovascular: Negative.   Gastrointestinal: Negative.   Musculoskeletal: Negative.   Skin: Negative.    Neurological: Negative.   Psychiatric/Behavioral: Negative.   All other systems reviewed and are negative.     Allergies  Review of patient's allergies indicates no known allergies.  Home Medications   Prior to Admission medications   Medication Sig Start Date End Date Taking? Authorizing Provider  aspirin EC 325 MG tablet Take 650 mg by mouth every morning.    Historical Provider, MD  cetirizine (ZYRTEC) 10 MG tablet Take 10 mg by mouth daily. As needed    Historical Provider, MD  docusate sodium (COLACE) 100 MG capsule Take 1 capsule (100 mg total) by mouth 2 (two) times daily. 08/17/14   Star Age, MD  HYDROcodone-acetaminophen (NORCO/VICODIN) 5-325 MG per tablet Take 1-2 tablets by mouth every 6 (six) hours as needed. Patient not taking: Reported on 09/03/2014 08/17/14   Star Age, MD  ibuprofen (ADVIL,MOTRIN) 200 MG tablet Take 600 mg by mouth every 6 (six) hours as needed for moderate pain.    Historical Provider, MD  senna (SENOKOT) 8.6 MG TABS tablet Take 1 tablet (8.6 mg total) by mouth daily. 08/17/14   Star Age, MD  sulfamethoxazole-trimethoprim (BACTRIM DS) 800-160 MG per tablet Take 1 tablet by mouth 2 (two) times daily. Patient not taking: Reported on 09/03/2014 08/17/14   Star Age, MD   BP 131/113 mmHg  Pulse 112  Temp(Src) 98.1 F (36.7 C) (Oral)  Resp 24  Ht 6\' 4"  (1.93 m)  Wt 324 lb 4 oz (147.079 kg)  BMI 39.49 kg/m2  SpO2 88% Physical Exam  Constitutional: He appears well-developed and well-nourished.  HENT:  Head: Normocephalic and atraumatic.  Eyes: Conjunctivae are normal. Pupils are equal, round, and reactive to light.  Neck: Neck supple. No tracheal deviation present. No thyromegaly present.  Cardiovascular: Regular rhythm.   No murmur heard. Mildly tachycardic  Pulmonary/Chest: Effort normal.  Diffuse rhonchi  Abdominal: Soft. Bowel sounds are normal. He exhibits no distension. There is no tenderness.  Obese  Musculoskeletal: Normal  range of motion. He exhibits no edema or tenderness.  Neurological: He is alert. Coordination normal.  Skin: Skin is warm and dry. No rash noted.  Psychiatric: He has a normal mood and affect.  Nursing note and vitals reviewed.   ED Course  Procedures (including critical care time) Labs Review Labs Reviewed  CULTURE, BLOOD (ROUTINE X 2)  CULTURE, BLOOD (ROUTINE X 2)  URINE CULTURE  COMPREHENSIVE METABOLIC PANEL  CBC WITH DIFFERENTIAL/PLATELET  URINALYSIS, ROUTINE W REFLEX MICROSCOPIC (NOT AT [facility])  I-STAT CG4 LACTIC ACID, ED    Imaging Review No results found. I have personally reviewed and evaluated these images and lab results as part of my medical decision-making.   EKG Interpretation None     chest x-ray reviewed by me  2:20 PM reading unchanged after nebulized treatment, intravenous fluids and antibiotics. Chest x-ray viewed by me Results for orders placed or performed during the hospital encounter of 02/24/15  Comprehensive metabolic panel  Result Value Ref Range   Sodium 142 135 - 145 mmol/L   Potassium 4.2 3.5 - 5.1 mmol/L   Chloride 104 101 - 111 mmol/L   CO2 27 22 - 32 mmol/L   Glucose, Bld 144 (H) 65 - 99 mg/dL   BUN 13 6 - 20 mg/dL   Creatinine, Ser 0.71 0.61 - 1.24 mg/dL   Calcium 9.9 8.9 - 10.3 mg/dL   Total Protein 8.0 6.5 - 8.1 g/dL   Albumin 3.7 3.5 - 5.0 g/dL   AST 48 (H) 15 - 41 U/L   ALT 51 17 - 63 U/L   Alkaline Phosphatase 139 (H) 38 - 126 U/L   Total Bilirubin 0.7 0.3 - 1.2 mg/dL   GFR calc non Af Amer >60 >60 mL/min   GFR calc Af Amer >60 >60 mL/min   Anion gap 11 5 - 15  CBC with Differential  Result Value Ref Range   WBC 17.4 (H) 4.0 - 10.5 K/uL   RBC 5.32 4.22 - 5.81 MIL/uL   Hemoglobin 17.5 (H) 13.0 - 17.0 g/dL   HCT 50.1 39.0 - 52.0 %   MCV 94.2 78.0 - 100.0 fL   MCH 32.9 26.0 - 34.0 pg   MCHC 34.9 30.0 - 36.0 g/dL   RDW 13.1 11.5 - 15.5 %   Platelets 186 150 - 400 K/uL   Neutrophils Relative % 82 %   Neutro Abs 14.3 (H) 1.7  - 7.7 K/uL   Lymphocytes Relative 7 %   Lymphs Abs 1.2 0.7 - 4.0 K/uL   Monocytes Relative 11 %   Monocytes Absolute 1.9 (H) 0.1 - 1.0 K/uL   Eosinophils Relative 0 %   Eosinophils Absolute 0.0 0.0 - 0.7 K/uL   Basophils Relative 0 %   Basophils Absolute 0.0 0.0 - 0.1 K/uL  I-Stat CG4 Lactic Acid, ED (Not at [facility])  Result Value Ref Range   Lactic Acid, Venous 2.31 (HH) 0.5 - 2.0 mmol/L   Comment NOTIFIED PHYSICIAN    Dg Chest 2 View  02/24/2015  CLINICAL DATA:  Cough and fever for 6 days. EXAM: CHEST  2 VIEW  COMPARISON:  None. FINDINGS: The heart size and mediastinal contours are within normal limits. Both lungs are clear. No pneumothorax or pleural effusion is noted. The visualized skeletal structures are unremarkable. IMPRESSION: No active cardiopulmonary disease. Electronically Signed   By: Marijo Conception, M.D.   On: 02/24/2015 13:34    MDM  Code sepsis called based on Sirs criteria of respiratory rate pulse and history of fever and lactic acidosis I suspect the patient has atypical pneumonia, more likely he has influenza-like illness. He does have oxygen requirement.  I consulted hospitalist service will be admitted to telemetry floor IV fluids at 20 mL/kg ordered. Patient meets clinical criteria for sepsis with at least 2 SIRS and suspected source of infecrtion repiratory.  Final diagnoses:  None   diagnosis #1 sepsis #2 hypoxia     Orlie Dakin, MD 02/24/15 1454

## 2015-02-24 NOTE — ED Notes (Signed)
Pt reports diagnosed with pneumonia yesterday at [facility]. Pt given prescribed medications. Pt here because symptoms seem to get worse.

## 2015-02-24 NOTE — H&P (Signed)
[facility] History and Physical  John Griffin [id number] DOB: 1950/09/28 DOA: 02/24/2015  Referring physician: Leanne Lovely PCP: Stephens Shire, MD   Chief Complaint: fever worsening sob  HPI: John Griffin is a very pleasant 65 y.o. male hypertension, tobacco use, will stay cancer presents emergency department with the chief complaint of persistent worsening shortness of breath intermittent fever. Initial evaluation in the emergency department reveals acute respiratory failure with hypoxia likely related to acute bronchitis.  Information is obtained from the patient who reports 7 day history of progressive worsening cough and increased sputum production intermittent fever. He reports max temperature 101.2 days ago. Suspected symptoms include postnasal drip. He denies chest pain palpitations headache dizziness syncope or near-syncope. He denies abdominal pain nausea vomiting diarrhea. He went to urgent care yesterday was given steroids injection and Levaquin as well as inhaler. This morning his shortness of breath cough were worse he came to the emergency department  In the emergency department he's afebrile with an elevated blood pressure tachycardia  And hypoxic  Review of Systems:  10 point review of systems complete and all systems are negative except as indicated in the history of present illness  Past Medical History  Diagnosis Date  . Arthritis     hands   . Urinary frequency   . Umbilical hernia   . Prostate cancer ([place])   . Tobacco use   . HTN (hypertension)    Past Surgical History  Procedure Laterality Date  . Bone graft right ankle   1972  . Right shoulder surgery   1992   . Robot assisted laparoscopic radical prostatectomy N/A 08/17/2014    Procedure: ROBOTIC ASSISTED LAPAROSCOPIC RADICAL PROSTATECTOMY LEVEL 3  AND UMBILICAL HERNIA REPAIR;  Surgeon: Raynelle Bring, MD;  Location: [facility];  Service: Urology;  Laterality: N/A;  . Lymphadenectomy Bilateral  08/17/2014    Procedure: LYMPHADENECTOMY;  Surgeon: Raynelle Bring, MD;  Location: [facility];  Service: Urology;  Laterality: Bilateral;   Social History:  reports that he has been smoking Cigarettes.  He has a 40 pack-year smoking history. He has never used smokeless tobacco. He reports that he drinks alcohol. He reports that he does not use illicit drugs.  No Known Allergies  No family history on file. family medical history reviewed. Family medical history is positive for emphysema prostate cancer hypertension.  Prior to Admission medications   Medication Sig Start Date End Date Taking? Authorizing Provider  acetaminophen (TYLENOL) 500 MG tablet Take 1,000 mg by mouth every 6 (six) hours as needed for mild pain.   Yes Historical Provider, MD  albuterol (PROVENTIL HFA;VENTOLIN HFA) 108 (90 Base) MCG/ACT inhaler Inhale 1 puff into the lungs every 6 (six) hours as needed for wheezing or shortness of breath.   Yes Historical Provider, MD  aspirin EC 325 MG tablet Take 650 mg by mouth every morning.   Yes Historical Provider, MD  cetirizine (ZYRTEC) 10 MG tablet Take 10 mg by mouth daily.    Yes Historical Provider, MD  guaiFENesin (MUCINEX) 600 MG 12 hr tablet Take 600 mg by mouth 2 (two) times daily as needed for cough.   Yes Historical Provider, MD  ibuprofen (ADVIL,MOTRIN) 200 MG tablet Take 600 mg by mouth every 6 (six) hours as needed for moderate pain.   Yes Historical Provider, MD  levofloxacin (LEVAQUIN) 500 MG tablet Take 500 mg by mouth daily at 6 PM. 02/23/15  Yes Historical Provider, MD  pseudoephedrine (SUDAFED) 30 MG tablet Take 30 mg by mouth every  4 (four) hours as needed for congestion.   Yes Historical Provider, MD  docusate sodium (COLACE) 100 MG capsule Take 1 capsule (100 mg total) by mouth 2 (two) times daily. Patient not taking: Reported on 02/24/2015 08/17/14   Star Age, MD  HYDROcodone-acetaminophen (NORCO/VICODIN) 5-325 MG per tablet Take 1-2 tablets by mouth every 6 (six)  hours as needed. Patient not taking: Reported on 09/03/2014 08/17/14   Star Age, MD  senna (SENOKOT) 8.6 MG TABS tablet Take 1 tablet (8.6 mg total) by mouth daily. Patient not taking: Reported on 02/24/2015 08/17/14   Star Age, MD  sulfamethoxazole-trimethoprim (BACTRIM DS) 800-160 MG per tablet Take 1 tablet by mouth 2 (two) times daily. Patient not taking: Reported on 09/03/2014 08/17/14   Star Age, MD   Physical Exam: Filed Vitals:   02/24/15 1345 02/24/15 1400 02/24/15 1430 02/24/15 1500  BP: 141/90 143/78 143/83 140/84  Pulse: 104 111 111 110  Temp:      TempSrc:      Resp: 23 23 22 18   Height:      Weight:      SpO2: 94% 97% 95% 98%    Wt Readings from Last 3 Encounters:  02/24/15 147.079 kg (324 lb 4 oz)  08/17/14 146.965 kg (324 lb)  08/09/14 146.965 kg (324 lb)    General:  Appears slightly anxious somewhat uncomfortable sitting straight up in bed Eyes: PERRL, normal lids, irises & conjunctiva ENT: grossly normal hearing, mucous membranes of his mouth pink and moist Neck: no LAD, masses or thyromegaly Cardiovascular: Tachycardic heart sounds distant, no m/r/g. Trace LE edema.  Respiratory: Moderate to severe increased work of breathing with conversation. Using abdominal accessory muscles. Able to complete sentence Frequent cough. Breath sounds quite distant coarse with a faint end-expiratory wheeze Abdomen: soft, ntnd positive bowel sounds obese Skin: no rash or induration seen on limited exam Musculoskeletal: grossly normal tone BUE/BLE Psychiatric: grossly normal mood and affect, speech fluent and appropriate Neurologic: grossly non-focal. Speech clear facial symmetry           Labs on Admission:  Basic Metabolic Panel:  Recent Labs Lab 02/24/15 1221  NA 142  K 4.2  CL 104  CO2 27  GLUCOSE 144*  BUN 13  CREATININE 0.71  CALCIUM 9.9   Liver Function Tests:  Recent Labs Lab 02/24/15 1221  AST 48*  ALT 51  ALKPHOS 139*  BILITOT 0.7    PROT 8.0  ALBUMIN 3.7   No results for input(s): LIPASE, AMYLASE in the last 168 hours. No results for input(s): AMMONIA in the last 168 hours. CBC:  Recent Labs Lab 02/24/15 1221  WBC 17.4*  NEUTROABS 14.3*  HGB 17.5*  HCT 50.1  MCV 94.2  PLT 186   Cardiac Enzymes: No results for input(s): CKTOTAL, CKMB, CKMBINDEX, TROPONINI in the last 168 hours.  BNP (last 3 results) No results for input(s): BNP in the last 8760 hours.  ProBNP (last 3 results) No results for input(s): PROBNP in the last 8760 hours.  CBG: No results for input(s): GLUCAP in the last 168 hours.  Radiological Exams on Admission: Dg Chest 2 View  02/24/2015  CLINICAL DATA:  Cough and fever for 6 days. EXAM: CHEST  2 VIEW COMPARISON:  None. FINDINGS: The heart size and mediastinal contours are within normal limits. Both lungs are clear. No pneumothorax or pleural effusion is noted. The visualized skeletal structures are unremarkable. IMPRESSION: No active cardiopulmonary disease. Electronically Signed   By: Marijo Conception,  M.D.   On: 02/24/2015 13:34    EKG: pending on admission  Assessment/Plan Principal Problem:   Acute respiratory failure with hypoxia (HCC) Active Problems:   SIRS (systemic inflammatory response syndrome) (HCC)   Leukocytosis   Tachycardia   Tobacco use   HTN (hypertension)   Acute bronchitis   Sepsis ([place])  #1. Acute respiratory failure with hypoxia likely related to acute bronchitis and long-term smoker. Saturation level dropped 88% on room air. Chest x-ray reveals lungs clear no pneumothorax or pleural effusion no active cardiopulmonary disease. -Admit to telemetry -Nebulizers every 4 hours -Scheduled Solu-Medrol -Influenza panel -Rocephin and azithromycin per pharmacy -Flutter valve -Antitussives -Wean oxygen as able -Recommend outpatient follow-up for pulmonary function tests  #2. Sirs. Chest x-ray without pneumonia he has leukocytosis which may be attributed to  recent steroid injections, mild tachycardia, hypoxia leukocytosis. Lactic acid 2.31 He is afebrile nontoxic appearing -Antibiotics as noted above -Fluid resuscitation in ED -Influenza panel -Blood cultures -Monitor on telemetry  #3. Hypertension. Fair control in the emergency department. Home meds include no antihypertensives -Monitor -Hydralazine as needed  4. Tachycardia/leukocytosis. Related to above. He is afebrile and not toxic appearing -Continue with IV fluids -Changes nebs to Xopenex -Blood cultures as noted above -Monitor  5. History of prostate cancer status post radical prostatectomy. Reports stress incontinence -Stable -Followed by Dr. Alinda Money   Code Status: full DVT Prophylaxis: Family Communication: wife at bedside Disposition Plan: home when ready  Time spent: 86 minutes  [facility]    I have examined the patient, reviewed the chart and modified the above note which I agree with. Acute bronchitis, COPD flare, severe cough and congestion on exam. Agree with antibiotics, steroids, Nebs. Add Tussionex and Mucinex.   He will need a referral for PFTs at [facility] once stable.  He states he has been referred to [place] to have them done but does not want to drive so far. Advised to quit smoking which he is agreeing to.   Isadore Palecek,MD Pager # [phone] [email or web address] 02/24/2015, 3:27 PM

## 2015-02-25 ENCOUNTER — Inpatient Hospital Stay (HOSPITAL_COMMUNITY): Payer: Medicare Other

## 2015-02-25 DIAGNOSIS — A419 Sepsis, unspecified organism: Secondary | ICD-10-CM

## 2015-02-25 LAB — COMPREHENSIVE METABOLIC PANEL
ALBUMIN: 3.4 g/dL — AB (ref 3.5–5.0)
ALK PHOS: 122 U/L (ref 38–126)
ALT: 45 U/L (ref 17–63)
ANION GAP: 10 (ref 5–15)
AST: 43 U/L — ABNORMAL HIGH (ref 15–41)
BUN: 14 mg/dL (ref 6–20)
CALCIUM: 8.8 mg/dL — AB (ref 8.9–10.3)
CHLORIDE: 104 mmol/L (ref 101–111)
CO2: 28 mmol/L (ref 22–32)
CREATININE: 0.51 mg/dL — AB (ref 0.61–1.24)
GFR calc Af Amer: >60 mL/min (ref >60)
GFR calc non Af Amer: >60 mL/min (ref >60)
Glucose, Bld: 165 mg/dL — ABNORMAL HIGH (ref 65–99)
Potassium: 3.9 mmol/L (ref 3.5–5.1)
SODIUM: 142 mmol/L (ref 135–145)
TOTAL PROTEIN: 6.9 g/dL (ref 6.5–8.1)
Total Bilirubin: 0.5 mg/dL (ref 0.3–1.2)

## 2015-02-25 LAB — GLUCOSE, CAPILLARY
GLUCOSE-CAPILLARY: 168 mg/dL — AB (ref 65–99)
Glucose-Capillary: 161 mg/dL — ABNORMAL HIGH (ref 65–99)
Glucose-Capillary: 196 mg/dL — ABNORMAL HIGH (ref 65–99)
Glucose-Capillary: 201 mg/dL — ABNORMAL HIGH (ref 65–99)

## 2015-02-25 LAB — MAGNESIUM: MAGNESIUM: 2.3 mg/dL (ref 1.7–2.4)

## 2015-02-25 MED ORDER — ALBUTEROL SULFATE (2.5 MG/3ML) 0.083% IN NEBU
INHALATION_SOLUTION | RESPIRATORY_TRACT | Status: AC
  Filled 2015-02-25: qty 3

## 2015-02-25 MED ORDER — ALBUTEROL SULFATE (2.5 MG/3ML) 0.083% IN NEBU
2.5000 mg | INHALATION_SOLUTION | RESPIRATORY_TRACT | Status: DC | PRN
  Administered 2015-02-25: 10:00:00 via RESPIRATORY_TRACT
  Filled 2015-02-25: qty 3

## 2015-02-25 MED ORDER — LEVALBUTEROL HCL 0.63 MG/3ML IN NEBU
0.6300 mg | INHALATION_SOLUTION | Freq: Three times a day (TID) | RESPIRATORY_TRACT | Status: DC
  Administered 2015-02-26 – 2015-03-02 (×14): 0.63 mg via RESPIRATORY_TRACT
  Filled 2015-02-25 (×13): qty 3

## 2015-02-25 MED ORDER — METHYLPREDNISOLONE SODIUM SUCC 125 MG IJ SOLR
60.0000 mg | Freq: Once | INTRAMUSCULAR | Status: DC
  Administered 2015-02-25: 60 mg via INTRAVENOUS

## 2015-02-25 MED ORDER — HYDRALAZINE HCL 20 MG/ML IJ SOLN
10.0000 mg | Freq: Four times a day (QID) | INTRAMUSCULAR | Status: DC | PRN
  Administered 2015-02-26 – 2015-02-28 (×3): 10 mg via INTRAVENOUS
  Filled 2015-02-25 (×3): qty 1

## 2015-02-25 NOTE — Progress Notes (Signed)
Transported pt on BIPAP to 3W.

## 2015-02-25 NOTE — Progress Notes (Signed)
Utilization review completed.  

## 2015-02-25 NOTE — Progress Notes (Signed)
Patient very labored O2 sats 87 on 4 lit O2 , resp 31, hr 117. Very diminished in lower lobes bilat with expiratory wheezing upper lobes bilat. Dr Wynelle Cleveland called , rapid response called.

## 2015-02-25 NOTE — Significant Event (Signed)
Rapid Response Event Note  Overview: Time Called: 0913 Arrival Time: 0917 Event Type: Respiratory  Initial Focused Assessment:  Called by RN for patient with respiratory distress.  Upon my arrival to patients room, RN and family at bedside.  Patient sitting up in bed with nasal cannula at 4 lpm.  Sat 93%, HR 110, RR 34.  Patient is SOB, with increased WOB trouble speaking in full sentences   Interventions:  Breath Sounds diminished with expiratory wheezes b/l.  Breathing treatment of albuterol given by primary RN, sats increased to 98%.  Md at bedside, [facility] ordered.  Post treatment, Breath sounds- airways more open, increased wheezing upper lobes diminished at bases, still SOB, RR 28, sats 92% on 4 lpm, increased coughing noted.  Bp 185/94.  Another breathing treatment of xopenex given at 1005.  Patient to be placed on BIPAP   Event Summary:  RN to call if assistance needed   at      at          [facility], Harlin Rain

## 2015-02-25 NOTE — Progress Notes (Addendum)
[facility] Progress Note   John Griffin  [id number]  DOB: 04-Sep-1950  DOA: 02/24/2015 PCP: Stephens Shire, MD  Brief narrative: John Griffin is a 66 y.o. male hypertension, tobacco use, will stay cancer presents emergency department with the chief complaint of persistent worsening shortness of breath intermittent fever. Initial evaluation in the emergency department reveals acute respiratory failure with hypoxia likely related to acute bronchitis. Information is obtained from the patient who reports 7 day history of progressive worsening cough and increased sputum production intermittent fever which did not improve with Levaquin with he received from urgent care. He reports max temperature 101.2 days ago. Also admits to postnasal drip which he thinks is making his symptoms worse.   Subjective: Quite short of breath this AM and wheezing. I have checked on him 3 times today.Marland Kitchen Despite 2 neb treatments, he is still tachypnea and hypoxic. Will transfer to SDU and place on BiPAP- he is willing to be intubated if he does not improve.   Assessment/Plan: Principal Problem:   Acute respiratory failure with hypoxia - suspected to be secondary to respiratory infection which may be viral as his symptoms are not improving with Rocephin and Zithromax -Was started on IV Solu-Medrol 60 twice a day yesterday as well-we'll give him another dose of 60 mg at this time - Continue nebulizer treatments -Placed on BiPAP and monitor closely-if respiratory status does not improve, he may need to be intubated -Influenza panel negative  Active Problems:   SIRS (systemic inflammatory response syndrome)  -Due to above    Tobacco use -has been counseled to discontinue   Antibiotics: Anti-infectives    Start     Dose/Rate Route Frequency Ordered Stop   02/25/15 1400  cefTRIAXone (ROCEPHIN) 2 g in dextrose 5 % 50 mL IVPB     2 g 100 mL/hr over 30 Minutes Intravenous Every 24 hours 02/24/15 1923      02/25/15 1300  azithromycin (ZITHROMAX) 500 mg in dextrose 5 % 250 mL IVPB     500 mg 250 mL/hr over 60 Minutes Intravenous Every 24 hours 02/24/15 1923     02/24/15 1315  cefTRIAXone (ROCEPHIN) 1 g in dextrose 5 % 50 mL IVPB     1 g 100 mL/hr over 30 Minutes Intravenous  Once 02/24/15 1313 02/24/15 1409   02/24/15 1245  cefTRIAXone (ROCEPHIN) 1 g in dextrose 5 % 50 mL IVPB     1 g 100 mL/hr over 30 Minutes Intravenous  Once 02/24/15 1242 02/24/15 1333   02/24/15 1245  azithromycin (ZITHROMAX) 500 mg in dextrose 5 % 250 mL IVPB     500 mg 250 mL/hr over 60 Minutes Intravenous  Once 02/24/15 1242 02/24/15 1443     Code Status:     Code Status Orders        Start     Ordered   02/24/15 1459  Full code   Continuous     02/24/15 1504    Code Status History    Date Active Date Inactive Code Status Order ID Comments User Context   08/17/2014  6:45 PM 08/18/2014  5:34 PM Full Code [id number]  Raynelle Bring, MD Inpatient     Family Communication: Communicating with wife at bedside Disposition Plan: Follow in stepdown unit DVT prophylaxis: Lovenox Consultants:  Procedures:     Objective: Filed Weights   02/24/15 1217 02/24/15 1646 02/25/15 0443  Weight: 147.079 kg (324 lb 4 oz) 148 kg (326 lb 4.5 oz) 147.147 kg (324 lb 6.4  oz)    Intake/Output Summary (Last 24 hours) at 02/25/15 1050 Last data filed at 02/25/15 0850  Gross per 24 hour  Intake   3030 ml  Output    400 ml  Net   2630 ml     Vitals Filed Vitals:   02/25/15 0300 02/25/15 0443 02/25/15 0848 02/25/15 0900  BP:  160/88  170/89  Pulse: 98 114    Temp:  98 F (36.7 C)    TempSrc:  Oral    Resp: 20 20  32  Height:      Weight:  147.147 kg (324 lb 6.4 oz)    SpO2: 94% 92% 90% 86%    Exam:  General:  Pt is alert, not in acute distress  HEENT: No icterus, No thrush, oral mucosa moist  Cardiovascular: regular rate and rhythm, S1/S2 No murmur  Respiratory:  In respiratory distress-heart rate in  30s-pulse ox of 96 on 8 L of oxygen  Abdomen: Soft, +Bowel sounds, non tender, non distended, no guarding  MSK: No cyanosis or clubbing- no pedal edema   Data Reviewed: Basic Metabolic Panel:  Recent Labs Lab 02/24/15 1221 02/25/15 0352  NA 142 142  K 4.2 3.9  CL 104 104  CO2 27 28  GLUCOSE 144* 165*  BUN 13 14  CREATININE 0.71 0.51*  CALCIUM 9.9 8.8*   Liver Function Tests:  Recent Labs Lab 02/24/15 1221 02/25/15 0352  AST 48* 43*  ALT 51 45  ALKPHOS 139* 122  BILITOT 0.7 0.5  PROT 8.0 6.9  ALBUMIN 3.7 3.4*   No results for input(s): LIPASE, AMYLASE in the last 168 hours. No results for input(s): AMMONIA in the last 168 hours. CBC:  Recent Labs Lab 02/24/15 1221  WBC 17.4*  NEUTROABS 14.3*  HGB 17.5*  HCT 50.1  MCV 94.2  PLT 186   Cardiac Enzymes: No results for input(s): CKTOTAL, CKMB, CKMBINDEX, TROPONINI in the last 168 hours. BNP (last 3 results) No results for input(s): BNP in the last 8760 hours.  ProBNP (last 3 results) No results for input(s): PROBNP in the last 8760 hours.  CBG:  Recent Labs Lab 02/24/15 2053 02/25/15 0627  GLUCAP 187* 161*    No results found for this or any previous visit (from the past 240 hour(s)).   Studies: Dg Chest 2 View  02/24/2015  CLINICAL DATA:  Cough and fever for 6 days. EXAM: CHEST  2 VIEW COMPARISON:  None. FINDINGS: The heart size and mediastinal contours are within normal limits. Both lungs are clear. No pneumothorax or pleural effusion is noted. The visualized skeletal structures are unremarkable. IMPRESSION: No active cardiopulmonary disease. Electronically Signed   By: Marijo Conception, M.D.   On: 02/24/2015 13:34    Scheduled Meds:  Scheduled Meds: . aspirin EC  650 mg Oral q morning - 10a  . azithromycin  500 mg Intravenous Q24H  . cefTRIAXone (ROCEPHIN)  IV  2 g Intravenous Q24H  . chlorpheniramine-HYDROcodone  5 mL Oral Q12H  . enoxaparin (LOVENOX) injection  75 mg Subcutaneous Q24H  .  guaiFENesin  600 mg Oral BID  . insulin aspart  0-15 Units Subcutaneous TID WC  . insulin aspart  0-5 Units Subcutaneous QHS  . levalbuterol  0.63 mg Nebulization Q4H  . methylPREDNISolone (SOLU-MEDROL) injection  60 mg Intravenous Q12H  . sodium chloride flush  3 mL Intravenous Q12H   Continuous Infusions: . sodium chloride 1,000 mL (02/24/15 2000)    Time spent on care of  this patient: 55 min   [place], MD 02/25/2015, 10:50 AM  LOS: 1 day   [facility] Office  [phone] Pager - Text Page per [email or web address] If 7PM-7AM, please contact night-coverage [email or web address]

## 2015-02-25 NOTE — Progress Notes (Signed)
Report given to [place]en .Patient to be transferred via bed with bipap in place resp therapy with patient .

## 2015-02-25 NOTE — Progress Notes (Signed)
Pt. Came off BIPAP around 12:30 & stayed off for about 1.5 hours. Pt. Requested to go back on BIPAP for sleep. Pt. Stated that he wasn't having any trouble breathing & just wanted to go back on to take a nap. RN was made aware of this.

## 2015-02-25 NOTE — Progress Notes (Signed)
Pt wants to wear BiPAP at night for sleep. Pt is an absolutely no respiratory distress at this time. Pt breathing pattern/rate all are normal. No complications noted. Pt stated that he just wants to wear NIV to sleep with.

## 2015-02-26 LAB — GLUCOSE, CAPILLARY
GLUCOSE-CAPILLARY: 138 mg/dL — AB (ref 65–99)
GLUCOSE-CAPILLARY: 168 mg/dL — AB (ref 65–99)
Glucose-Capillary: 193 mg/dL — ABNORMAL HIGH (ref 65–99)
Glucose-Capillary: 149 mg/dL — ABNORMAL HIGH (ref 65–99)

## 2015-02-26 LAB — CBC
HEMATOCRIT: 48.5 % (ref 39.0–52.0)
Hemoglobin: 16.1 g/dL (ref 13.0–17.0)
MCH: 32.1 pg (ref 26.0–34.0)
MCHC: 33.2 g/dL (ref 30.0–36.0)
MCV: 96.6 fL (ref 78.0–100.0)
PLATELETS: 192 10*3/uL (ref 150–400)
RBC: 5.02 MIL/uL (ref 4.22–5.81)
RDW: 13.3 % (ref 11.5–15.5)
WBC: 12.9 10*3/uL — AB (ref 4.0–10.5)

## 2015-02-26 LAB — BASIC METABOLIC PANEL
Anion gap: 7 (ref 5–15)
BUN: 19 mg/dL (ref 6–20)
CO2: 33 mmol/L — ABNORMAL HIGH (ref 22–32)
CREATININE: 0.51 mg/dL — AB (ref 0.61–1.24)
Calcium: 8.6 mg/dL — ABNORMAL LOW (ref 8.9–10.3)
Chloride: 101 mmol/L (ref 101–111)
GFR calc Af Amer: >60 mL/min (ref >60)
GLUCOSE: 140 mg/dL — AB (ref 65–99)
Potassium: 4.1 mmol/L (ref 3.5–5.1)
SODIUM: 141 mmol/L (ref 135–145)

## 2015-02-26 LAB — MRSA PCR SCREENING: MRSA by PCR: NEGATIVE

## 2015-02-26 LAB — HEMOGLOBIN A1C
HEMOGLOBIN A1C: 5.8 % — AB (ref 4.8–5.6)
Mean Plasma Glucose: 120 mg/dL

## 2015-02-26 MED ORDER — IPRATROPIUM BROMIDE 0.02 % IN SOLN
RESPIRATORY_TRACT | Status: AC
  Filled 2015-02-26: qty 2.5

## 2015-02-26 MED ORDER — LORATADINE 10 MG PO TABS
10.0000 mg | ORAL_TABLET | Freq: Every day | ORAL | Status: DC
  Administered 2015-02-26 – 2015-03-02 (×5): 10 mg via ORAL
  Filled 2015-02-26 (×5): qty 1

## 2015-02-26 MED ORDER — INFLUENZA VAC SPLIT QUAD 0.5 ML IM SUSY: 0.5000 mL | PREFILLED_SYRINGE | INTRAMUSCULAR | Status: DC

## 2015-02-26 MED ORDER — IPRATROPIUM BROMIDE 0.02 % IN SOLN
0.5000 mg | Freq: Three times a day (TID) | RESPIRATORY_TRACT | Status: DC
  Administered 2015-02-26 – 2015-03-02 (×12): 0.5 mg via RESPIRATORY_TRACT
  Filled 2015-02-26 (×11): qty 2.5

## 2015-02-26 MED ORDER — PNEUMOCOCCAL VAC POLYVALENT 25 MCG/0.5ML IJ INJ
0.5000 mL | INJECTION | INTRAMUSCULAR | Status: DC
  Filled 2015-02-26: qty 0.5

## 2015-02-26 MED ORDER — LEVALBUTEROL HCL 0.63 MG/3ML IN NEBU
0.6300 mg | INHALATION_SOLUTION | Freq: Four times a day (QID) | RESPIRATORY_TRACT | Status: DC | PRN
  Administered 2015-02-27: 0.63 mg via RESPIRATORY_TRACT
  Filled 2015-02-26 (×2): qty 3

## 2015-02-26 MED ORDER — LORAZEPAM 2 MG/ML IJ SOLN
1.0000 mg | Freq: Four times a day (QID) | INTRAMUSCULAR | Status: DC | PRN
  Administered 2015-02-26: 1 mg via INTRAVENOUS
  Filled 2015-02-26: qty 1

## 2015-02-26 NOTE — Progress Notes (Signed)
[facility] Progress Note   Guillaume Brummer  [id number]  DOB: 1950/08/14  DOA: 02/24/2015 PCP: Stephens Shire, MD  Brief narrative: Chart reviewed.  John Griffin is a 65 y.o. male hypertension, tobacco use, prostate cancer presents emergency department with the chief complaint of persistent worsening shortness of breath intermittent fever. Initial evaluation in the emergency department reveals acute respiratory failure with hypoxia likely related to acute bronchitis. Information is obtained from the patient who reports 7 day history of progressive worsening cough and increased sputum production intermittent fever which did not improve with Levaquin with he received from urgent care. He reports max temperature 101.2 days ago. Also admits to postnasal drip which he thinks is making his symptoms worse.   Overnight: On and off bipap  Assessment/Plan: Principal Problem:   Acute respiratory failure with hypoxia Secondary to COPD exacerbation/acute bronchitis versus viral. Continue BiPAP as needed. Nursing reporting a significant anxiety component. For order Ativan as needed. Continue steroids, bronchodilators, step down monitoring.  Active Problems:   Tobacco use -has been counseled to discontinue   Antibiotics: Anti-infectives    Start     Dose/Rate Route Frequency Ordered Stop   02/25/15 1400  cefTRIAXone (ROCEPHIN) 2 g in dextrose 5 % 50 mL IVPB     2 g 100 mL/hr over 30 Minutes Intravenous Every 24 hours 02/24/15 1923     02/25/15 1300  azithromycin (ZITHROMAX) 500 mg in dextrose 5 % 250 mL IVPB     500 mg 250 mL/hr over 60 Minutes Intravenous Every 24 hours 02/24/15 1923     02/24/15 1315  cefTRIAXone (ROCEPHIN) 1 g in dextrose 5 % 50 mL IVPB     1 g 100 mL/hr over 30 Minutes Intravenous  Once 02/24/15 1313 02/24/15 1409   02/24/15 1245  cefTRIAXone (ROCEPHIN) 1 g in dextrose 5 % 50 mL IVPB     1 g 100 mL/hr over 30 Minutes Intravenous  Once 02/24/15 1242 02/24/15  1333   02/24/15 1245  azithromycin (ZITHROMAX) 500 mg in dextrose 5 % 250 mL IVPB     500 mg 250 mL/hr over 60 Minutes Intravenous  Once 02/24/15 1242 02/24/15 1443     Code Status:     Code Status Orders        Start     Ordered   02/24/15 1459  Full code   Continuous     02/24/15 1504    Code Status History    Date Active Date Inactive Code Status Order ID Comments User Context   08/17/2014  6:45 PM 08/18/2014  5:34 PM Full Code [id number]  Raynelle Bring, MD Inpatient     Family Communication:  Disposition Plan: Follow in stepdown unit DVT prophylaxis: Lovenox Consultants:  Procedures:   Subjective:  reports shortness of breath this morning and requested to go back on BiPAP. Feels better on BiPAP. Feels anxious.  Objective: Filed Weights   02/24/15 1646 02/25/15 0443 02/26/15 0450  Weight: 148 kg (326 lb 4.5 oz) 147.147 kg (324 lb 6.4 oz) 147.555 kg (325 lb 4.8 oz)    Intake/Output Summary (Last 24 hours) at 02/26/15 0806 Last data filed at 02/26/15 0655  Gross per 24 hour  Intake 1435.84 ml  Output    800 ml  Net 635.84 ml     Vitals Filed Vitals:   02/26/15 0507 02/26/15 0635 02/26/15 0708 02/26/15 0753  BP: 160/97 160/97 154/99 155/90  Pulse: 75  87 82  Temp:      TempSrc:  Resp: 20  26 21   Height:      Weight:      SpO2: 97%  96% 95%    Exam:  General:  Pt is alert, talkative on BiPAP. Appropriate.   HEENT: No icterus, No thrush, oral mucosa moist  Cardiovascular: regular rate and rhythm, S1/S2 No murmur  Respiratory:  diminished throughout without wheezes rhonchi or rales.   Abdomen: Soft, +Bowel sounds, non tender, non distended, no guarding  MSK: No cyanosis or clubbing- no pedal edema   Data Reviewed: Basic Metabolic Panel:  Recent Labs Lab 02/24/15 1221 02/25/15 0352 02/25/15 1106 02/26/15 0620  NA 142 142  --  141  K 4.2 3.9  --  4.1  CL 104 104  --  101  CO2 27 28  --  33*  GLUCOSE 144* 165*  --  140*  BUN 13 14  --   19  CREATININE 0.71 0.51*  --  0.51*  CALCIUM 9.9 8.8*  --  8.6*  MG  --   --  2.3  --    Liver Function Tests:  Recent Labs Lab 02/24/15 1221 02/25/15 0352  AST 48* 43*  ALT 51 45  ALKPHOS 139* 122  BILITOT 0.7 0.5  PROT 8.0 6.9  ALBUMIN 3.7 3.4*   No results for input(s): LIPASE, AMYLASE in the last 168 hours. No results for input(s): AMMONIA in the last 168 hours. CBC:  Recent Labs Lab 02/24/15 1221 02/26/15 0620  WBC 17.4* 12.9*  NEUTROABS 14.3*  --   HGB 17.5* 16.1  HCT 50.1 48.5  MCV 94.2 96.6  PLT 186 192   Cardiac Enzymes: No results for input(s): CKTOTAL, CKMB, CKMBINDEX, TROPONINI in the last 168 hours. BNP (last 3 results) No results for input(s): BNP in the last 8760 hours.  ProBNP (last 3 results) No results for input(s): PROBNP in the last 8760 hours.  CBG:  Recent Labs Lab 02/25/15 0627 02/25/15 1135 02/25/15 1626 02/25/15 2024 02/26/15 0752  GLUCAP 161* 196* 201* 168* 138*    Recent Results (from the past 240 hour(s))  Culture, blood (routine x 2)     Status: None (Preliminary result)   Collection Time: 02/24/15 12:54 PM  Result Value Ref Range Status   Specimen Description BLOOD RIGHT HAND  Final   Special Requests BOTTLES DRAWN AEROBIC AND ANAEROBIC 5ML  Final   Culture NO GROWTH 1 DAY  Final   Report Status PENDING  Incomplete  Culture, blood (routine x 2)     Status: None (Preliminary result)   Collection Time: 02/24/15 12:58 PM  Result Value Ref Range Status   Specimen Description BLOOD LEFT HAND  Final   Special Requests BOTTLES DRAWN AEROBIC AND ANAEROBIC 5ML  Final   Culture NO GROWTH 1 DAY  Final   Report Status PENDING  Incomplete     Studies: Dg Chest 2 View  02/24/2015  CLINICAL DATA:  Cough and fever for 6 days. EXAM: CHEST  2 VIEW COMPARISON:  None. FINDINGS: The heart size and mediastinal contours are within normal limits. Both lungs are clear. No pneumothorax or pleural effusion is noted. The visualized skeletal  structures are unremarkable. IMPRESSION: No active cardiopulmonary disease. Electronically Signed   By: Marijo Conception, M.D.   On: 02/24/2015 13:34   Dg Chest Port 1 View  02/25/2015  CLINICAL DATA:  Short of breath.  Wheezing. EXAM: PORTABLE CHEST 1 VIEW COMPARISON:  02/24/2015 FINDINGS: Normal cardiac silhouette. There is coarsened central bronchovascular markings. Mild  peribronchial cuffing. No focal infiltrate. No pneumothorax. IMPRESSION: Coarsened central bronchovascular markings suggest reactive airway disease or viral process. Electronically Signed   By: Suzy Bouchard M.D.   On: 02/25/2015 10:58    Scheduled Meds:  Scheduled Meds: . aspirin EC  650 mg Oral q morning - 10a  . azithromycin  500 mg Intravenous Q24H  . cefTRIAXone (ROCEPHIN)  IV  2 g Intravenous Q24H  . chlorpheniramine-HYDROcodone  5 mL Oral Q12H  . enoxaparin (LOVENOX) injection  75 mg Subcutaneous Q24H  . guaiFENesin  600 mg Oral BID  . insulin aspart  0-15 Units Subcutaneous TID WC  . insulin aspart  0-5 Units Subcutaneous QHS  . levalbuterol  0.63 mg Nebulization TID  . methylPREDNISolone (SOLU-MEDROL) injection  60 mg Intravenous Q12H  . sodium chloride flush  3 mL Intravenous Q12H   Continuous Infusions: . sodium chloride 1,000 mL (02/25/15 2028)    Time spent on care of this patient: 35 min   Julie Paolini L, MD 02/26/2015, 8:06 AM  LOS: 2 days   [facility]  [email or web address] password [email or web address]

## 2015-02-26 NOTE — Progress Notes (Signed)
Pt wants breaks from BiPAP now. [place] placed on pt.

## 2015-02-26 NOTE — Progress Notes (Signed)
Pt wants to remain on BiPAP v60 machine. No distress noted.pt wants it for Qhs. Pt is stable at this time

## 2015-02-27 LAB — GLUCOSE, CAPILLARY
GLUCOSE-CAPILLARY: 149 mg/dL — AB (ref 65–99)
GLUCOSE-CAPILLARY: 143 mg/dL — AB (ref 65–99)
GLUCOSE-CAPILLARY: 221 mg/dL — AB (ref 65–99)
Glucose-Capillary: 141 mg/dL — ABNORMAL HIGH (ref 65–99)

## 2015-02-27 MED ORDER — INFLUENZA VAC SPLIT QUAD 0.5 ML IM SUSY
0.5000 mL | PREFILLED_SYRINGE | INTRAMUSCULAR | Status: AC
  Administered 2015-02-28: 0.5 mL via INTRAMUSCULAR
  Filled 2015-02-27: qty 0.5

## 2015-02-27 MED ORDER — AZITHROMYCIN 250 MG PO TABS
500.0000 mg | ORAL_TABLET | Freq: Every day | ORAL | Status: AC
  Administered 2015-02-27 – 2015-02-28 (×2): 500 mg via ORAL
  Filled 2015-02-27 (×2): qty 2

## 2015-02-27 MED ORDER — PNEUMOCOCCAL VAC POLYVALENT 25 MCG/0.5ML IJ INJ
0.5000 mL | INJECTION | INTRAMUSCULAR | Status: AC
  Administered 2015-02-28: 0.5 mL via INTRAMUSCULAR
  Filled 2015-02-27: qty 0.5

## 2015-02-27 MED ORDER — MOMETASONE FURO-FORMOTEROL FUM 100-5 MCG/ACT IN AERO
2.0000 | INHALATION_SPRAY | Freq: Two times a day (BID) | RESPIRATORY_TRACT | Status: DC
  Administered 2015-02-27 – 2015-03-02 (×7): 2 via RESPIRATORY_TRACT
  Filled 2015-02-27: qty 8.8

## 2015-02-27 MED ORDER — PREDNISONE 20 MG PO TABS
40.0000 mg | ORAL_TABLET | Freq: Every day | ORAL | Status: DC
  Administered 2015-02-27 – 2015-03-01 (×3): 40 mg via ORAL
  Filled 2015-02-27 (×3): qty 2

## 2015-02-27 NOTE — Care Management Important Message (Signed)
Important Message  Patient Details  Name: John Griffin MRN: [id number] Date of Birth: Nov 19, 1950   Medicare Important Message Given:  Yes    [name] 02/27/2015, 10:48 AMImportant Message  Patient Details  Name: John Griffin MRN: [id number] Date of Birth: Sep 10, 1950   Medicare Important Message Given:  Yes    [name] 02/27/2015, 10:48 AM

## 2015-02-27 NOTE — Progress Notes (Signed)
[facility] Progress Note   John Griffin  [id number]  DOB: 08-30-1950  DOA: 02/24/2015 PCP: Stephens Shire, MD  Brief narrative: Chart reviewed.  John Griffin is a 65 y.o. male hypertension, tobacco use, prostate cancer presents emergency department with the chief complaint of persistent worsening shortness of breath intermittent fever. Initial evaluation in the emergency department reveals acute respiratory failure with hypoxia likely related to acute bronchitis. Information is obtained from the patient who reports 7 day history of progressive worsening cough and increased sputum production intermittent fever which did not improve with Levaquin with he received from urgent care. He reports max temperature 101.2 days ago. Also admits to postnasal drip which he thinks is making his symptoms worse. Started on antibiotics, bronchodilators, steroids. Shortly after admission, required transfer to stepdown and BiPAP due to increased work of breathing.  Overnight: Rested on bipap overnight  Assessment/Plan: Principal Problem:   Acute respiratory failure with hypoxia Secondary to COPD exacerbation/acute bronchitis versus viral. Flu negative. Much improved. Increase activity. Wean oxygen. Change to oral steroids. Possibly home in a day or 2 if continues to improve.  Active Problems:   Tobacco use -has been counseled to discontinue   Antibiotics: Anti-infectives    Start     Dose/Rate Route Frequency Ordered Stop   02/25/15 1400  cefTRIAXone (ROCEPHIN) 2 g in dextrose 5 % 50 mL IVPB     2 g 100 mL/hr over 30 Minutes Intravenous Every 24 hours 02/24/15 1923     02/25/15 1300  azithromycin (ZITHROMAX) 500 mg in dextrose 5 % 250 mL IVPB     500 mg 250 mL/hr over 60 Minutes Intravenous Every 24 hours 02/24/15 1923     02/24/15 1315  cefTRIAXone (ROCEPHIN) 1 g in dextrose 5 % 50 mL IVPB     1 g 100 mL/hr over 30 Minutes Intravenous  Once 02/24/15 1313 02/24/15 1409   02/24/15  1245  cefTRIAXone (ROCEPHIN) 1 g in dextrose 5 % 50 mL IVPB     1 g 100 mL/hr over 30 Minutes Intravenous  Once 02/24/15 1242 02/24/15 1333   02/24/15 1245  azithromycin (ZITHROMAX) 500 mg in dextrose 5 % 250 mL IVPB     500 mg 250 mL/hr over 60 Minutes Intravenous  Once 02/24/15 1242 02/24/15 1443     Code Status:     Code Status Orders        Start     Ordered   02/24/15 1459  Full code   Continuous     02/24/15 1504    Code Status History    Date Active Date Inactive Code Status Order ID Comments User Context   08/17/2014  6:45 PM 08/18/2014  5:34 PM Full Code [id number]  Raynelle Bring, MD Inpatient     Family Communication:  Disposition Plan: home 1-2 days if improves DVT prophylaxis: Lovenox Consultants:  Procedures:   Subjective:  feels much better. Requested to be on BiPAP last night, but shortness of breath much improved today.   Objective: Filed Weights   02/25/15 0443 02/26/15 0450 02/27/15 0301  Weight: 147.147 kg (324 lb 6.4 oz) 147.555 kg (325 lb 4.8 oz) 148.417 kg (327 lb 3.2 oz)    Intake/Output Summary (Last 24 hours) at 02/27/15 0831 Last data filed at 02/26/15 2359  Gross per 24 hour  Intake    360 ml  Output   1475 ml  Net  -1115 ml     Vitals Filed Vitals:   02/26/15 2109 02/26/15 2356 02/27/15  0301 02/27/15 0323  BP: 157/94 137/84 166/92   Pulse: 74 95 93   Temp:  98.1 F (36.7 C) 98.2 F (36.8 C)   TempSrc:  Oral Oral   Resp: 22 15 20    Height:      Weight:   148.417 kg (327 lb 3.2 oz)   SpO2: 96% 95% 97% 95%    Exam:  General:  sitting at edge of bed, on nasal cannula. Talkative and comfortable.    Cardiovascular: regular rate and rhythm, S1/S2 No murmur  Respiratory:   good air movement with minimal fleas. No rhonchi or rales.  Abdomen: Soft, +Bowel sounds, non tender, non distended, no guarding  MSK: No cyanosis or clubbing- no pedal edema   Data Reviewed: Basic Metabolic Panel:  Recent Labs Lab 02/24/15 1221  02/25/15 0352 02/25/15 1106 02/26/15 0620  NA 142 142  --  141  K 4.2 3.9  --  4.1  CL 104 104  --  101  CO2 27 28  --  33*  GLUCOSE 144* 165*  --  140*  BUN 13 14  --  19  CREATININE 0.71 0.51*  --  0.51*  CALCIUM 9.9 8.8*  --  8.6*  MG  --   --  2.3  --    Liver Function Tests:  Recent Labs Lab 02/24/15 1221 02/25/15 0352  AST 48* 43*  ALT 51 45  ALKPHOS 139* 122  BILITOT 0.7 0.5  PROT 8.0 6.9  ALBUMIN 3.7 3.4*   No results for input(s): LIPASE, AMYLASE in the last 168 hours. No results for input(s): AMMONIA in the last 168 hours. CBC:  Recent Labs Lab 02/24/15 1221 02/26/15 0620  WBC 17.4* 12.9*  NEUTROABS 14.3*  --   HGB 17.5* 16.1  HCT 50.1 48.5  MCV 94.2 96.6  PLT 186 192   Cardiac Enzymes: No results for input(s): CKTOTAL, CKMB, CKMBINDEX, TROPONINI in the last 168 hours. BNP (last 3 results) No results for input(s): BNP in the last 8760 hours.  ProBNP (last 3 results) No results for input(s): PROBNP in the last 8760 hours.  CBG:  Recent Labs Lab 02/26/15 0752 02/26/15 1136 02/26/15 1603 02/26/15 2136 02/27/15 0743  GLUCAP 138* 193* 168* 149* 141*    Recent Results (from the past 240 hour(s))  Culture, blood (routine x 2)     Status: None (Preliminary result)   Collection Time: 02/24/15 12:54 PM  Result Value Ref Range Status   Specimen Description BLOOD RIGHT HAND  Final   Special Requests BOTTLES DRAWN AEROBIC AND ANAEROBIC 5ML  Final   Culture NO GROWTH 2 DAYS  Final   Report Status PENDING  Incomplete  Culture, blood (routine x 2)     Status: None (Preliminary result)   Collection Time: 02/24/15 12:58 PM  Result Value Ref Range Status   Specimen Description BLOOD LEFT HAND  Final   Special Requests BOTTLES DRAWN AEROBIC AND ANAEROBIC 5ML  Final   Culture NO GROWTH 2 DAYS  Final   Report Status PENDING  Incomplete  MRSA PCR Screening     Status: None   Collection Time: 02/26/15  4:24 AM  Result Value Ref Range Status   MRSA by  PCR NEGATIVE NEGATIVE Final    Comment:        The GeneXpert MRSA Assay (FDA approved for NASAL specimens only), is one component of a comprehensive MRSA colonization surveillance program. It is not intended to diagnose MRSA infection nor to guide or monitor  treatment for MRSA infections.      Studies: Dg Chest Port 1 View  02/25/2015  CLINICAL DATA:  Short of breath.  Wheezing. EXAM: PORTABLE CHEST 1 VIEW COMPARISON:  02/24/2015 FINDINGS: Normal cardiac silhouette. There is coarsened central bronchovascular markings. Mild peribronchial cuffing. No focal infiltrate. No pneumothorax. IMPRESSION: Coarsened central bronchovascular markings suggest reactive airway disease or viral process. Electronically Signed   By: Suzy Bouchard M.D.   On: 02/25/2015 10:58    Scheduled Meds:  Scheduled Meds: . aspirin EC  650 mg Oral q morning - 10a  . azithromycin  500 mg Intravenous Q24H  . cefTRIAXone (ROCEPHIN)  IV  2 g Intravenous Q24H  . chlorpheniramine-HYDROcodone  5 mL Oral Q12H  . enoxaparin (LOVENOX) injection  75 mg Subcutaneous Q24H  . guaiFENesin  600 mg Oral BID  . Influenza vac split quadrivalent PF  0.5 mL Intramuscular Tomorrow-1000  . insulin aspart  0-15 Units Subcutaneous TID WC  . insulin aspart  0-5 Units Subcutaneous QHS  . ipratropium  0.5 mg Nebulization TID  . levalbuterol  0.63 mg Nebulization TID  . loratadine  10 mg Oral Daily  . methylPREDNISolone (SOLU-MEDROL) injection  60 mg Intravenous Q12H  . pneumococcal 23 valent vaccine  0.5 mL Intramuscular Tomorrow-1000  . sodium chloride flush  3 mL Intravenous Q12H   Continuous Infusions:    Time spent on care of this patient: 25 min   Obie Silos L, MD 02/27/2015, 8:31 AM  LOS: 3 days   [facility]  [email or web address] password [facility]

## 2015-02-27 NOTE — Care Management Note (Signed)
Case Management Note  Patient Details  Name: John Griffin MRN: [id number] Date of Birth: 1950-03-01  Subjective/Objective: Pt admitted for Acute Respiratory Failure. 02 being weaned.                     Action/Plan: CM will continue to monitor for disposition needs.    Expected Discharge Date:                  Expected Discharge Plan:  [place]  In-House Referral:  NA  Discharge planning Services  CM Consult  Post Acute Care Choice:    Choice offered to:     DME Arranged:    DME Agency:     HH Arranged:    HH Agency:     Status of Service:  In process, will continue to follow  Medicare Important Message Given:  Yes Date Medicare IM Given:    Medicare IM give by:    Date Additional Medicare IM Given:    Additional Medicare Important Message give by:     If discussed at [place] of Stay Meetings, dates discussed:    Additional Comments:  Bethena Roys, RN 02/27/2015, 11:45 AM

## 2015-02-27 NOTE — Plan of Care (Signed)
Problem: ICU Phase Progression Outcomes Goal: O2 sats trending toward baseline Outcome: Progressing Pt progressing off of Bipap onto nasal cannula O2 sats remain in mid 90's on 4 L of O2

## 2015-02-28 ENCOUNTER — Inpatient Hospital Stay (HOSPITAL_COMMUNITY): Payer: Medicare Other

## 2015-02-28 DIAGNOSIS — J209 Acute bronchitis, unspecified: Secondary | ICD-10-CM

## 2015-02-28 DIAGNOSIS — Z720 Tobacco use: Secondary | ICD-10-CM

## 2015-02-28 DIAGNOSIS — R609 Edema, unspecified: Secondary | ICD-10-CM

## 2015-02-28 DIAGNOSIS — J440 Chronic obstructive pulmonary disease with acute lower respiratory infection: Secondary | ICD-10-CM | POA: Diagnosis not present

## 2015-02-28 DIAGNOSIS — J9601 Acute respiratory failure with hypoxia: Secondary | ICD-10-CM

## 2015-02-28 DIAGNOSIS — I10 Essential (primary) hypertension: Secondary | ICD-10-CM

## 2015-02-28 LAB — CBC
HCT: 51.3 % (ref 39.0–52.0)
HEMOGLOBIN: 17.2 g/dL — AB (ref 13.0–17.0)
MCH: 31.4 pg (ref 26.0–34.0)
MCHC: 33.5 g/dL (ref 30.0–36.0)
MCV: 93.8 fL (ref 78.0–100.0)
Platelets: 208 10*3/uL (ref 150–400)
RBC: 5.47 MIL/uL (ref 4.22–5.81)
RDW: 12.7 % (ref 11.5–15.5)
WBC: 13.5 10*3/uL — ABNORMAL HIGH (ref 4.0–10.5)

## 2015-02-28 LAB — GLUCOSE, CAPILLARY
GLUCOSE-CAPILLARY: 143 mg/dL — AB (ref 65–99)
GLUCOSE-CAPILLARY: 144 mg/dL — AB (ref 65–99)
Glucose-Capillary: 116 mg/dL — ABNORMAL HIGH (ref 65–99)
Glucose-Capillary: 130 mg/dL — ABNORMAL HIGH (ref 65–99)

## 2015-02-28 MED ORDER — AMLODIPINE BESYLATE 5 MG PO TABS
5.0000 mg | ORAL_TABLET | Freq: Every day | ORAL | Status: DC
  Administered 2015-02-28 – 2015-03-01 (×2): 5 mg via ORAL
  Filled 2015-02-28: qty 2
  Filled 2015-02-28: qty 1

## 2015-02-28 MED ORDER — POTASSIUM CHLORIDE CRYS ER 20 MEQ PO TBCR
40.0000 meq | EXTENDED_RELEASE_TABLET | Freq: Once | ORAL | Status: AC
  Administered 2015-02-28: 40 meq via ORAL
  Filled 2015-02-28: qty 2

## 2015-02-28 MED ORDER — FUROSEMIDE 10 MG/ML IJ SOLN
40.0000 mg | Freq: Once | INTRAMUSCULAR | Status: AC
  Administered 2015-02-28: 40 mg via INTRAVENOUS
  Filled 2015-02-28: qty 4

## 2015-02-28 NOTE — Evaluation (Signed)
Physical Therapy Evaluation Patient Details Name: John Griffin MRN: [id number] DOB: December 23, 1950 Today's Date: 02/28/2015   History of Present Illness  Pt is a 65 y/o M w/ dx of acute respiratory failure w/ hypoxia due to COPD exacerbation.  Pt's PMH includes urinary frequency, prastate cancer, Rt ankle bone graft, Rt shoulder surgery.   Clinical Impression  Pt admitted with above diagnosis. Pt currently with functional limitations due to the deficits listed below (see PT Problem List). John Griffin demonstrates min instability w/ high level balance activities.  DOE 4/4 and requires multiple standing rest breaks and education on pursed lip breathing.  Pt will benefit from skilled PT, w/ focus on balance interventions, to increase their independence and safety with mobility to allow discharge to the venue listed below.      Follow Up Recommendations Home health PT;Supervision - Intermittent    Equipment Recommendations  None recommended by PT    Recommendations for Other Services       Precautions / Restrictions Precautions Precautions: Fall Restrictions Weight Bearing Restrictions: No      Mobility  Bed Mobility Overal bed mobility: Modified Independent             General bed mobility comments: HOB elevated.  No cues or physical assist needed  Transfers Overall transfer level: Needs assistance Equipment used: None Transfers: Sit to/from Stand Sit to Stand: Supervision         General transfer comment: Cues to push from bed as pt reaches for moving tray when standing.  Supervision for safety.  Ambulation/Gait Ambulation/Gait assistance: Min guard Ambulation Distance (Feet): 250 Feet Assistive device: None Gait Pattern/deviations: Step-through pattern;Decreased stride length;Wide base of support   Gait velocity interpretation: Below normal speed for age/gender General Gait Details: DOE 4/4 and requires 3 standing rest breaks to catch his breath.  SpO2 remains 90% or  higher on 2 LPM.  Cues for pursed lip breathing.  Close min guard due to min instability noted.  Stairs            Wheelchair Mobility    Modified Rankin (Stroke Patients Only)       Balance Overall balance assessment: Needs assistance Sitting-balance support: Bilateral upper extremity supported;Feet supported Sitting balance-Leahy Scale: Good     Standing balance support: No upper extremity supported;During functional activity Standing balance-Leahy Scale: Fair               High level balance activites: Head turns;Sudden stops;Turns;Direction changes;Backward walking;Other (comment) (stepping over object) High Level Balance Comments: Min instability noted w/ backward walking and stepping over object             Pertinent Vitals/Pain Pain Assessment: No/denies pain    Home Living Family/patient expects to be discharged to:: Private residence Living Arrangements: Spouse/significant other Available Help at Discharge: Family;Available PRN/intermittently;Friend(s) Type of Home: House Home Access: Stairs to enter Entrance Stairs-Rails: Left;Right;Can reach both Technical brewer of Steps: 5 Home Layout: One level Home Equipment: None Additional Comments: Wife works but pt has multiple family members and friends who are available prn    Prior Function Level of Independence: Independent         Comments: Pt would walk briskly on his treadmill 3x/day      Hand Dominance        Extremity/Trunk Assessment   Upper Extremity Assessment: Overall WFL for tasks assessed           Lower Extremity Assessment: Overall WFL for tasks assessed  Communication   Communication: HOH  Cognition Arousal/Alertness: Awake/alert Behavior During Therapy: WFL for tasks assessed/performed Overall Cognitive Status: Within Functional Limits for tasks assessed                      General Comments      Exercises        Assessment/Plan     PT Assessment Patient needs continued PT services  PT Diagnosis Difficulty walking   PT Problem List Decreased activity tolerance;Decreased balance;Decreased safety awareness;Cardiopulmonary status limiting activity;Obesity  PT Treatment Interventions DME instruction;Gait training;Stair training;Functional mobility training;Therapeutic activities;Therapeutic exercise;Balance training;Patient/family education   PT Goals (Current goals can be found in the Care Plan section) Acute Rehab PT Goals Patient Stated Goal: to go home PT Goal Formulation: With patient Time For Goal Achievement: 03/09/15 Potential to Achieve Goals: Good    Frequency Min 3X/week   Barriers to discharge Inaccessible home environment;Decreased caregiver support steps to enter home and intermittent assist     Co-evaluation               End of Session Equipment Utilized During Treatment: Oxygen Activity Tolerance: Patient tolerated treatment well;Patient limited by fatigue Patient left: in chair;with call bell/phone within reach;with chair alarm set Nurse Communication: Mobility status;Other (comment) (SpO2)         Time: [id number] PT Time Calculation (min) (ACUTE ONLY): 38 min   Charges:   PT Evaluation $PT Eval Moderate Complexity: 1 Procedure PT Treatments $Gait Training: 23-37 mins   PT G Codes:       Joslyn Hy PT, DPT [phone] Pager: [phone] 02/28/2015, 3:46 PM

## 2015-02-28 NOTE — Progress Notes (Signed)
SATURATION QUALIFICATIONS: (This note is used to comply with regulatory documentation for home oxygen)  Patient Saturations on Room Air at Rest = 93 %  Patient Saturations on 2 Liters of oxygen while Ambulating = 90 %  John Griffin

## 2015-02-28 NOTE — Progress Notes (Signed)
*  PRELIMINARY RESULTS* Vascular Ultrasound Lower extremity venous duplex has been completed.  Preliminary findings: no obvious DVT noted in BLE.   Landry Mellow, RDMS, RVT  02/28/2015, 11:33 AM

## 2015-02-28 NOTE — Progress Notes (Signed)
PATIENT DETAILS Name: John Griffin Age: 65 y.o. Sex: male Date of Birth: [birth year] Admit Date: 02/24/2015 Admitting Physician Debbe Odea, MD [id number] A, MD  Subjective: Continues to improve.  Assessment/Plan: Principal Problem:  Acute respiratory failure with hypoxia: Secondary to COPD exacerbation with bronchitis. Titrate off oxygen, continue prednisone, and bronchodilators. Lower extremity Dopplers negative. Ceck echo.  Active Problems: COPD exacerbation: Improving, scattered rhonchi-but moving air well. Continue prednisone-but taper, continue bronchodilators and empiric antibiotics.  Hypertension: Start low-dose amlodipine. Follow.  Tobacco abuse: Counseled.  Obesity: Will likely require sleep study as an outpatient-high suspicion for a.  Disposition: Remain inpatient-home in 1-2 days  Antimicrobial agents  See below  Anti-infectives    Start     Dose/Rate Route Frequency Ordered Stop   02/27/15 1000  azithromycin (ZITHROMAX) tablet 500 mg     500 mg Oral Daily 02/27/15 0833 02/28/15 0935   02/25/15 1400  cefTRIAXone (ROCEPHIN) 2 g in dextrose 5 % 50 mL IVPB     2 g 100 mL/hr over 30 Minutes Intravenous Every 24 hours 02/24/15 1923 03/01/15 1359   02/25/15 1300  azithromycin (ZITHROMAX) 500 mg in dextrose 5 % 250 mL IVPB  Status:  Discontinued     500 mg 250 mL/hr over 60 Minutes Intravenous Every 24 hours 02/24/15 1923 02/27/15 0833   02/24/15 1315  cefTRIAXone (ROCEPHIN) 1 g in dextrose 5 % 50 mL IVPB     1 g 100 mL/hr over 30 Minutes Intravenous  Once 02/24/15 1313 02/24/15 1409   02/24/15 1245  cefTRIAXone (ROCEPHIN) 1 g in dextrose 5 % 50 mL IVPB     1 g 100 mL/hr over 30 Minutes Intravenous  Once 02/24/15 1242 02/24/15 1333   02/24/15 1245  azithromycin (ZITHROMAX) 500 mg in dextrose 5 % 250 mL IVPB     500 mg 250 mL/hr over 60 Minutes Intravenous  Once 02/24/15 1242 02/24/15 1443      DVT Prophylaxis: Prophylactic  Lovenox   Code Status: Full code   Family Communication None  Procedures: None  CONSULTS:  None  Time spent 25 minutes-Greater than 50% of this time was spent in counseling, explanation of diagnosis, planning of further management, and coordination of care.  MEDICATIONS: Scheduled Meds: . aspirin EC  650 mg Oral q morning - 10a  . cefTRIAXone (ROCEPHIN)  IV  2 g Intravenous Q24H  . chlorpheniramine-HYDROcodone  5 mL Oral Q12H  . enoxaparin (LOVENOX) injection  75 mg Subcutaneous Q24H  . guaiFENesin  600 mg Oral BID  . insulin aspart  0-15 Units Subcutaneous TID WC  . insulin aspart  0-5 Units Subcutaneous QHS  . ipratropium  0.5 mg Nebulization TID  . levalbuterol  0.63 mg Nebulization TID  . loratadine  10 mg Oral Daily  . mometasone-formoterol  2 puff Inhalation BID  . pneumococcal 23 valent vaccine  0.5 mL Intramuscular Tomorrow-1000  . predniSONE  40 mg Oral Q breakfast  . sodium chloride flush  3 mL Intravenous Q12H   Continuous Infusions:  PRN Meds:.acetaminophen **OR** acetaminophen, alum & mag hydroxide-simeth, bisacodyl, hydrALAZINE, HYDROcodone-acetaminophen, ibuprofen, levalbuterol, LORazepam, morphine injection, ondansetron **OR** ondansetron (ZOFRAN) IV, senna-docusate, sodium phosphate    PHYSICAL EXAM: Vital signs in last 24 hours: Filed Vitals:   02/28/15 0600 02/28/15 0632 02/28/15 0757 02/28/15 0904  BP: 178/101 162/91    Pulse: 77 78    Temp:   98.7 F (37.1 C)  TempSrc:   Oral   Resp: 23 25    Height:      Weight:      SpO2: 94% 93%  88%    Weight change: -1.043 kg (-2 lb 4.8 oz) Filed Weights   02/26/15 0450 02/27/15 0301 02/28/15 0348  Weight: 147.555 kg (325 lb 4.8 oz) 148.417 kg (327 lb 3.2 oz) 147.374 kg (324 lb 14.4 oz)   Body mass index is 39.56 kg/(m^2).   Gen Exam: Awake and alert with clear speech.   Neck: Supple, No JVD.   Chest: B/L Clear.   CVS: S1 S2 Regular, no murmurs.  Abdomen: soft, BS +, non tender, non  distended.  Extremities:+ edema, lower extremities warm to touch. Neurologic: Non Focal.   Skin: No Rash.   Wounds: N/A.   Intake/Output from previous day:  Intake/Output Summary (Last 24 hours) at 02/28/15 1135 Last data filed at 02/28/15 0820  Gross per 24 hour  Intake   1060 ml  Output   1125 ml  Net    -65 ml     LAB RESULTS: CBC  Recent Labs Lab 02/24/15 1221 02/26/15 0620 02/28/15 0345  WBC 17.4* 12.9* 13.5*  HGB 17.5* 16.1 17.2*  HCT 50.1 48.5 51.3  PLT 186 192 208  MCV 94.2 96.6 93.8  MCH 32.9 32.1 31.4  MCHC 34.9 33.2 33.5  RDW 13.1 13.3 12.7  LYMPHSABS 1.2  --   --   MONOABS 1.9*  --   --   EOSABS 0.0  --   --   BASOSABS 0.0  --   --     Chemistries   Recent Labs Lab 02/24/15 1221 02/25/15 0352 02/25/15 1106 02/26/15 0620  NA 142 142  --  141  K 4.2 3.9  --  4.1  CL 104 104  --  101  CO2 27 28  --  33*  GLUCOSE 144* 165*  --  140*  BUN 13 14  --  19  CREATININE 0.71 0.51*  --  0.51*  CALCIUM 9.9 8.8*  --  8.6*  MG  --   --  2.3  --     CBG:  Recent Labs Lab 02/27/15 0743 02/27/15 1122 02/27/15 1627 02/27/15 2004 02/28/15 0753  GLUCAP 141* 149* 143* 221* 143*    GFR Estimated Creatinine Clearance: 144.5 mL/min (by C-G formula based on Cr of 0.51).  Coagulation profile No results for input(s): INR, PROTIME in the last 168 hours.  Cardiac Enzymes No results for input(s): CKMB, TROPONINI, MYOGLOBIN in the last 168 hours.  Invalid input(s): CK  Invalid input(s): POCBNP No results for input(s): DDIMER in the last 72 hours. No results for input(s): HGBA1C in the last 72 hours. No results for input(s): CHOL, HDL, LDLCALC, TRIG, CHOLHDL, LDLDIRECT in the last 72 hours. No results for input(s): TSH, T4TOTAL, T3FREE, THYROIDAB in the last 72 hours.  Invalid input(s): FREET3 No results for input(s): VITAMINB12, FOLATE, FERRITIN, TIBC, IRON, RETICCTPCT in the last 72 hours. No results for input(s): LIPASE, AMYLASE in the last 72  hours.  Urine Studies No results for input(s): UHGB, CRYS in the last 72 hours.  Invalid input(s): UACOL, UAPR, USPG, UPH, UTP, UGL, UKET, UBIL, UNIT, UROB, ULEU, UEPI, UWBC, URBC, UBAC, CAST, UCOM, BILUA  MICROBIOLOGY: Recent Results (from the past 240 hour(s))  Culture, blood (routine x 2)     Status: None (Preliminary result)   Collection Time: 02/24/15 12:54 PM  Result Value Ref Range Status   Specimen  Description BLOOD RIGHT HAND  Final   Special Requests BOTTLES DRAWN AEROBIC AND ANAEROBIC 5ML  Final   Culture NO GROWTH 3 DAYS  Final   Report Status PENDING  Incomplete  Culture, blood (routine x 2)     Status: None (Preliminary result)   Collection Time: 02/24/15 12:58 PM  Result Value Ref Range Status   Specimen Description BLOOD LEFT HAND  Final   Special Requests BOTTLES DRAWN AEROBIC AND ANAEROBIC 5ML  Final   Culture NO GROWTH 3 DAYS  Final   Report Status PENDING  Incomplete  MRSA PCR Screening     Status: None   Collection Time: 02/26/15  4:24 AM  Result Value Ref Range Status   MRSA by PCR NEGATIVE NEGATIVE Final    Comment:        The GeneXpert MRSA Assay (FDA approved for NASAL specimens only), is one component of a comprehensive MRSA colonization surveillance program. It is not intended to diagnose MRSA infection nor to guide or monitor treatment for MRSA infections.     RADIOLOGY STUDIES/RESULTS: Dg Chest 2 View  02/24/2015  CLINICAL DATA:  Cough and fever for 6 days. EXAM: CHEST  2 VIEW COMPARISON:  None. FINDINGS: The heart size and mediastinal contours are within normal limits. Both lungs are clear. No pneumothorax or pleural effusion is noted. The visualized skeletal structures are unremarkable. IMPRESSION: No active cardiopulmonary disease. Electronically Signed   By: Marijo Conception, M.D.   On: 02/24/2015 13:34   Dg Chest Port 1 View  02/25/2015  CLINICAL DATA:  Short of breath.  Wheezing. EXAM: PORTABLE CHEST 1 VIEW COMPARISON:  02/24/2015 FINDINGS:  Normal cardiac silhouette. There is coarsened central bronchovascular markings. Mild peribronchial cuffing. No focal infiltrate. No pneumothorax. IMPRESSION: Coarsened central bronchovascular markings suggest reactive airway disease or viral process. Electronically Signed   By: Suzy Bouchard M.D.   On: 02/25/2015 10:58    Oren Binet, MD  [facility] Pager:[phone]  If 7PM-7AM, please contact night-coverage [email or web address] Password [email or web address] 02/28/2015, 11:35 AM   LOS: 4 days

## 2015-02-28 NOTE — Progress Notes (Signed)
UR Completed Ileane Sando Graves-Bigelow, RN,BSN [phone]  

## 2015-03-01 ENCOUNTER — Inpatient Hospital Stay (HOSPITAL_COMMUNITY): Payer: Medicare Other

## 2015-03-01 DIAGNOSIS — M1990 Unspecified osteoarthritis, unspecified site: Secondary | ICD-10-CM | POA: Insufficient documentation

## 2015-03-01 LAB — BASIC METABOLIC PANEL
Anion gap: 9 (ref 5–15)
BUN: 21 mg/dL — AB (ref 6–20)
CHLORIDE: 98 mmol/L — AB (ref 101–111)
CO2: 33 mmol/L — AB (ref 22–32)
CREATININE: 0.62 mg/dL (ref 0.61–1.24)
Calcium: 8.8 mg/dL — ABNORMAL LOW (ref 8.9–10.3)
GFR calc Af Amer: >60 mL/min (ref >60)
GFR calc non Af Amer: >60 mL/min (ref >60)
Glucose, Bld: 100 mg/dL — ABNORMAL HIGH (ref 65–99)
Potassium: 3.8 mmol/L (ref 3.5–5.1)
Sodium: 140 mmol/L (ref 135–145)

## 2015-03-01 LAB — CULTURE, BLOOD (ROUTINE X 2)
CULTURE: NO GROWTH
Culture: NO GROWTH

## 2015-03-01 LAB — GLUCOSE, CAPILLARY
GLUCOSE-CAPILLARY: 145 mg/dL — AB (ref 65–99)
GLUCOSE-CAPILLARY: 126 mg/dL — AB (ref 65–99)
Glucose-Capillary: 96 mg/dL (ref 65–99)
Glucose-Capillary: 104 mg/dL — ABNORMAL HIGH (ref 65–99)

## 2015-03-01 MED ORDER — FUROSEMIDE 10 MG/ML IJ SOLN
20.0000 mg | Freq: Once | INTRAMUSCULAR | Status: AC
  Administered 2015-03-01: 20 mg via INTRAVENOUS
  Filled 2015-03-01: qty 2

## 2015-03-01 MED ORDER — PERFLUTREN LIPID MICROSPHERE
1.0000 mL | INTRAVENOUS | Status: AC | PRN
  Administered 2015-03-01: 4 mL via INTRAVENOUS
  Filled 2015-03-01: qty 10

## 2015-03-01 MED ORDER — PREDNISONE 20 MG PO TABS
30.0000 mg | ORAL_TABLET | Freq: Every day | ORAL | Status: DC
  Administered 2015-03-02: 30 mg via ORAL
  Filled 2015-03-01: qty 1

## 2015-03-01 MED ORDER — POTASSIUM CHLORIDE CRYS ER 20 MEQ PO TBCR
20.0000 meq | EXTENDED_RELEASE_TABLET | Freq: Once | ORAL | Status: AC
  Administered 2015-03-01: 20 meq via ORAL
  Filled 2015-03-01: qty 1

## 2015-03-01 NOTE — Progress Notes (Signed)
PATIENT DETAILS Name: Uryah Clendaniel Age: 65 y.o. Sex: male Date of Birth: 1950/07/13 Admit Date: 02/24/2015 Admitting Physician Debbe Odea, MD [id number] A, MD  Subjective: Continues to improve-able to move around more today  Assessment/Plan: Principal Problem:  Acute respiratory failure with hypoxia: Secondary to COPD exacerbation with bronchitis. Suspect will need home oxygen on discharge, taper down prednisone, continue bronchodilators. Lower extremity Dopplers negative for DVT, awaiting echocardiogram.   Active Problems: COPD exacerbation: Much improved, moving air well-very minimal scattered rhonchi. Antibiotics have been discontinued, continue prednisone but taper, continue bronchodilators. Have arranged for outpatient follow-up with pulmonology for formal PFTs.   Hypertension: Controlled-now on low-dose amlodipine. Continue to follow and adjust accordingly   Tobacco abuse: Counseled.  Obesity: Will likely require sleep study as an outpatient-high suspicion for OSA  Disposition: Remain inpatient-home tomorrow.  Antimicrobial agents  See below  Anti-infectives    Start     Dose/Rate Route Frequency Ordered Stop   02/27/15 1000  azithromycin (ZITHROMAX) tablet 500 mg     500 mg Oral Daily 02/27/15 0833 02/28/15 0935   02/25/15 1400  cefTRIAXone (ROCEPHIN) 2 g in dextrose 5 % 50 mL IVPB     2 g 100 mL/hr over 30 Minutes Intravenous Every 24 hours 02/24/15 1923 02/28/15 1456   02/25/15 1300  azithromycin (ZITHROMAX) 500 mg in dextrose 5 % 250 mL IVPB  Status:  Discontinued     500 mg 250 mL/hr over 60 Minutes Intravenous Every 24 hours 02/24/15 1923 02/27/15 0833   02/24/15 1315  cefTRIAXone (ROCEPHIN) 1 g in dextrose 5 % 50 mL IVPB     1 g 100 mL/hr over 30 Minutes Intravenous  Once 02/24/15 1313 02/24/15 1409   02/24/15 1245  cefTRIAXone (ROCEPHIN) 1 g in dextrose 5 % 50 mL IVPB     1 g 100 mL/hr over 30 Minutes Intravenous  Once 02/24/15  1242 02/24/15 1333   02/24/15 1245  azithromycin (ZITHROMAX) 500 mg in dextrose 5 % 250 mL IVPB     500 mg 250 mL/hr over 60 Minutes Intravenous  Once 02/24/15 1242 02/24/15 1443      DVT Prophylaxis: Prophylactic Lovenox   Code Status: Full code   Family Communication Spouse at bedside  Procedures: None  CONSULTS:  None  Time spent 25 minutes-Greater than 50% of this time was spent in counseling, explanation of diagnosis, planning of further management, and coordination of care.  MEDICATIONS: Scheduled Meds: . amLODipine  5 mg Oral Daily  . aspirin EC  650 mg Oral q morning - 10a  . chlorpheniramine-HYDROcodone  5 mL Oral Q12H  . enoxaparin (LOVENOX) injection  75 mg Subcutaneous Q24H  . guaiFENesin  600 mg Oral BID  . insulin aspart  0-15 Units Subcutaneous TID WC  . insulin aspart  0-5 Units Subcutaneous QHS  . ipratropium  0.5 mg Nebulization TID  . levalbuterol  0.63 mg Nebulization TID  . loratadine  10 mg Oral Daily  . mometasone-formoterol  2 puff Inhalation BID  . predniSONE  40 mg Oral Q breakfast  . sodium chloride flush  3 mL Intravenous Q12H   Continuous Infusions:  PRN Meds:.acetaminophen **OR** acetaminophen, alum & mag hydroxide-simeth, bisacodyl, hydrALAZINE, HYDROcodone-acetaminophen, ibuprofen, levalbuterol, LORazepam, morphine injection, ondansetron **OR** ondansetron (ZOFRAN) IV, senna-docusate, sodium phosphate    PHYSICAL EXAM: Vital signs in last 24 hours: Filed Vitals:   03/01/15 [id number] 03/01/15 0725 03/01/15 [id number] 03/01/15 [id number]  BP: 159/95 133/85    Pulse: 75     Temp:  97.8 F (36.6 C)    TempSrc:  Oral    Resp:  16    Height:      Weight:      SpO2: 94%  98% 98%    Weight change: -1.678 kg (-3 lb 11.2 oz) Filed Weights   02/27/15 0301 02/28/15 0348 03/01/15 0431  Weight: 148.417 kg (327 lb 3.2 oz) 147.374 kg (324 lb 14.4 oz) 145.695 kg (321 lb 3.2 oz)   Body mass index is 39.11 kg/(m^2).   Gen Exam: Awake and alert with  clear speech.   Neck: Supple, No JVD.   Chest: B/L Clear.   CVS: S1 S2 Regular, no murmurs.  Abdomen: soft, BS +, non tender, non distended.  Extremities: Trace edema, lower extremities warm to touch. Neurologic: Non Focal.   Skin: No Rash.   Wounds: N/A.   Intake/Output from previous day:  Intake/Output Summary (Last 24 hours) at 03/01/15 1044 Last data filed at 03/01/15 0726  Gross per 24 hour  Intake    240 ml  Output   1200 ml  Net   -960 ml     LAB RESULTS: CBC  Recent Labs Lab 02/24/15 1221 02/26/15 0620 02/28/15 0345  WBC 17.4* 12.9* 13.5*  HGB 17.5* 16.1 17.2*  HCT 50.1 48.5 51.3  PLT 186 192 208  MCV 94.2 96.6 93.8  MCH 32.9 32.1 31.4  MCHC 34.9 33.2 33.5  RDW 13.1 13.3 12.7  LYMPHSABS 1.2  --   --   MONOABS 1.9*  --   --   EOSABS 0.0  --   --   BASOSABS 0.0  --   --     Chemistries   Recent Labs Lab 02/24/15 1221 02/25/15 0352 02/25/15 1106 02/26/15 0620 03/01/15 0636  NA 142 142  --  141 140  K 4.2 3.9  --  4.1 3.8  CL 104 104  --  101 98*  CO2 27 28  --  33* 33*  GLUCOSE 144* 165*  --  140* 100*  BUN 13 14  --  19 21*  CREATININE 0.71 0.51*  --  0.51* 0.62  CALCIUM 9.9 8.8*  --  8.6* 8.8*  MG  --   --  2.3  --   --     CBG:  Recent Labs Lab 02/28/15 0753 02/28/15 1148 02/28/15 1641 02/28/15 2117 03/01/15 0721  GLUCAP 143* 116* 130* 144* 96    GFR Estimated Creatinine Clearance: 143.8 mL/min (by C-G formula based on Cr of 0.62).  Coagulation profile No results for input(s): INR, PROTIME in the last 168 hours.  Cardiac Enzymes No results for input(s): CKMB, TROPONINI, MYOGLOBIN in the last 168 hours.  Invalid input(s): CK  Invalid input(s): POCBNP No results for input(s): DDIMER in the last 72 hours. No results for input(s): HGBA1C in the last 72 hours. No results for input(s): CHOL, HDL, LDLCALC, TRIG, CHOLHDL, LDLDIRECT in the last 72 hours. No results for input(s): TSH, T4TOTAL, T3FREE, THYROIDAB in the last 72  hours.  Invalid input(s): FREET3 No results for input(s): VITAMINB12, FOLATE, FERRITIN, TIBC, IRON, RETICCTPCT in the last 72 hours. No results for input(s): LIPASE, AMYLASE in the last 72 hours.  Urine Studies No results for input(s): UHGB, CRYS in the last 72 hours.  Invalid input(s): UACOL, UAPR, USPG, UPH, UTP, UGL, UKET, UBIL, UNIT, UROB, ULEU, UEPI, UWBC, URBC, UBAC, CAST, UCOM, BILUA  MICROBIOLOGY: Recent Results (  from the past 240 hour(s))  Culture, blood (routine x 2)     Status: None (Preliminary result)   Collection Time: 02/24/15 12:54 PM  Result Value Ref Range Status   Specimen Description BLOOD RIGHT HAND  Final   Special Requests BOTTLES DRAWN AEROBIC AND ANAEROBIC 5ML  Final   Culture NO GROWTH 4 DAYS  Final   Report Status PENDING  Incomplete  Culture, blood (routine x 2)     Status: None (Preliminary result)   Collection Time: 02/24/15 12:58 PM  Result Value Ref Range Status   Specimen Description BLOOD LEFT HAND  Final   Special Requests BOTTLES DRAWN AEROBIC AND ANAEROBIC 5ML  Final   Culture NO GROWTH 4 DAYS  Final   Report Status PENDING  Incomplete  MRSA PCR Screening     Status: None   Collection Time: 02/26/15  4:24 AM  Result Value Ref Range Status   MRSA by PCR NEGATIVE NEGATIVE Final    Comment:        The GeneXpert MRSA Assay (FDA approved for NASAL specimens only), is one component of a comprehensive MRSA colonization surveillance program. It is not intended to diagnose MRSA infection nor to guide or monitor treatment for MRSA infections.     RADIOLOGY STUDIES/RESULTS: Dg Chest 2 View  02/24/2015  CLINICAL DATA:  Cough and fever for 6 days. EXAM: CHEST  2 VIEW COMPARISON:  None. FINDINGS: The heart size and mediastinal contours are within normal limits. Both lungs are clear. No pneumothorax or pleural effusion is noted. The visualized skeletal structures are unremarkable. IMPRESSION: No active cardiopulmonary disease. Electronically Signed    By: Marijo Conception, M.D.   On: 02/24/2015 13:34   Dg Chest Port 1 View  02/25/2015  CLINICAL DATA:  Short of breath.  Wheezing. EXAM: PORTABLE CHEST 1 VIEW COMPARISON:  02/24/2015 FINDINGS: Normal cardiac silhouette. There is coarsened central bronchovascular markings. Mild peribronchial cuffing. No focal infiltrate. No pneumothorax. IMPRESSION: Coarsened central bronchovascular markings suggest reactive airway disease or viral process. Electronically Signed   By: Suzy Bouchard M.D.   On: 02/25/2015 10:58    Oren Binet, MD  [facility] Pager:[phone]  If 7PM-7AM, please contact night-coverage [email or web address] Password [email or web address] 03/01/2015, 10:44 AM   LOS: 5 days
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
## 2015-03-01 NOTE — Progress Notes (Signed)
SATURATION QUALIFICATIONS: (This note is used to comply with regulatory documentation for home oxygen)  Patient Saturations on Room Air at Rest = 93 %  Patient Saturations on Room Air while Ambulating = 88 %  Patient Saturations on 2 Liters of oxygen while Ambulating = 90 %  Ferdinand Lango, RN

## 2015-03-01 NOTE — Progress Notes (Signed)
  Echocardiogram 2D Echocardiogram has been performed.  Bobbye Charleston 03/01/2015, 11:37 AM

## 2015-03-02 DIAGNOSIS — R6510 Systemic inflammatory response syndrome (SIRS) of non-infectious origin without acute organ dysfunction: Secondary | ICD-10-CM

## 2015-03-02 LAB — GLUCOSE, CAPILLARY
GLUCOSE-CAPILLARY: 110 mg/dL — AB (ref 65–99)
GLUCOSE-CAPILLARY: 137 mg/dL — AB (ref 65–99)

## 2015-03-02 MED ORDER — BISOPROLOL FUMARATE 5 MG PO TABS
5.0000 mg | ORAL_TABLET | Freq: Every day | ORAL | Status: DC
  Administered 2015-03-02: 5 mg via ORAL
  Filled 2015-03-02: qty 1

## 2015-03-02 MED ORDER — PREDNISONE 10 MG PO TABS: ORAL_TABLET | ORAL | Status: DC

## 2015-03-02 MED ORDER — MOMETASONE FURO-FORMOTEROL FUM 100-5 MCG/ACT IN AERO: 2.0000 | INHALATION_SPRAY | Freq: Two times a day (BID) | RESPIRATORY_TRACT | Status: DC

## 2015-03-02 MED ORDER — ALBUTEROL SULFATE HFA 108 (90 BASE) MCG/ACT IN AERS: 2.0000 | INHALATION_SPRAY | RESPIRATORY_TRACT | Status: DC | PRN

## 2015-03-02 MED ORDER — BISOPROLOL FUMARATE 5 MG PO TABS: 5.0000 mg | ORAL_TABLET | Freq: Every day | ORAL | Status: DC

## 2015-03-02 MED ORDER — TIOTROPIUM BROMIDE MONOHYDRATE 18 MCG IN CAPS: 18.0000 ug | ORAL_CAPSULE | Freq: Every day | RESPIRATORY_TRACT | Status: DC

## 2015-03-02 NOTE — Progress Notes (Signed)
Pt as well as his wife were given discharge instructions. Pt & wife stated that they did not have any questions. Pt was given information on new medications, his hospital diagnosis, as well as his diet. Pt given prescriptions. Pt's IV was removed. CCMD was notified of pt being discharged. Hoover Brunette, RN

## 2015-03-02 NOTE — Progress Notes (Signed)
Pt walked about 200 feet with the RN around the unit without oxygen. Pt's Oxygen saturation dropped to 89% once but once pt stopped for a few he was able to maintain is saturations between 91-94%. Will continue to monitor the pt. Hoover Brunette, RN

## 2015-03-02 NOTE — Discharge Summary (Signed)
PATIENT DETAILS Name: John Griffin Age: 65 y.o. Sex: male Date of Birth: 09-16-1950 MRN: [id number]. Admitting Physician: Debbe Odea, MD [id number], MD  Admit Date: 02/24/2015 Discharge date: 03/02/2015  Recommendations for Outpatient Follow-up:  1. Please ensure follow-up with pulmonology for formal PFTs and outpatient sleep study.  2. Please repeat CBC/BMET at next visit  PRIMARY DISCHARGE DIAGNOSIS:  Principal Problem:   Acute respiratory failure with hypoxia (HCC) Active Problems:   SIRS (systemic inflammatory response syndrome) (HCC)   Leukocytosis   Tachycardia   Tobacco use   HTN (hypertension)   Acute bronchitis   Sepsis ([place])      PAST MEDICAL HISTORY: Past Medical History  Diagnosis Date  . Arthritis     hands   . Urinary frequency   . Umbilical hernia   . Prostate cancer ([place])   . Tobacco use   . HTN (hypertension)     DISCHARGE MEDICATIONS: Current Discharge Medication List    START taking these medications   Details  bisoprolol (ZEBETA) 5 MG tablet Take 1 tablet (5 mg total) by mouth daily. Qty: 30 tablet, Refills: 0    mometasone-formoterol (DULERA) 100-5 MCG/ACT AERO Inhale 2 puffs into the lungs 2 (two) times daily. Qty: 13 g, Refills: 0    predniSONE (DELTASONE) 10 MG tablet Take 3 tablets (30 mg) daily for 2 days, then, Take 2 tablets (20 mg) daily for 2 days, then, Take 1 tablets (10 mg) daily for 1 days, then stop Qty: 11 tablet, Refills: 0    tiotropium (SPIRIVA HANDIHALER) 18 MCG inhalation capsule Place 1 capsule (18 mcg total) into inhaler and inhale daily. Qty: 30 capsule, Refills: 0      CONTINUE these medications which have CHANGED   Details  albuterol (PROVENTIL HFA;VENTOLIN HFA) 108 (90 Base) MCG/ACT inhaler Inhale 2 puffs into the lungs every 4 (four) hours as needed for wheezing or shortness of breath. Qty: 18 g, Refills: 0      CONTINUE these medications which have NOT CHANGED   Details  acetaminophen  (TYLENOL) 500 MG tablet Take 1,000 mg by mouth every 6 (six) hours as needed for mild pain.    aspirin EC 325 MG tablet Take 650 mg by mouth every morning.    cetirizine (ZYRTEC) 10 MG tablet Take 10 mg by mouth daily.     guaiFENesin (MUCINEX) 600 MG 12 hr tablet Take 600 mg by mouth 2 (two) times daily as needed for cough.    ibuprofen (ADVIL,MOTRIN) 200 MG tablet Take 600 mg by mouth every 6 (six) hours as needed for moderate pain.      STOP taking these medications     levofloxacin (LEVAQUIN) 500 MG tablet      pseudoephedrine (SUDAFED) 30 MG tablet      HYDROcodone-acetaminophen (NORCO/VICODIN) 5-325 MG per tablet         ALLERGIES:  No Known Allergies  BRIEF HPI:  See H&P, Labs, Consult and Test reports for all details in brief, patient is a 65 year old male with tobacco use, hypertension admitted with worsening fever and shortness of breath. Patient was subsequently admitted for further evaluation and treatment  CONSULTATIONS:   None  PERTINENT RADIOLOGIC STUDIES: Dg Chest 2 View  02/24/2015  CLINICAL DATA:  Cough and fever for 6 days. EXAM: CHEST  2 VIEW COMPARISON:  None. FINDINGS: The heart size and mediastinal contours are within normal limits. Both lungs are clear. No pneumothorax or pleural effusion is noted. The visualized skeletal structures are unremarkable.  IMPRESSION: No active cardiopulmonary disease. Electronically Signed   By: Marijo Conception, M.D.   On: 02/24/2015 13:34   Dg Chest Port 1 View  02/25/2015  CLINICAL DATA:  Short of breath.  Wheezing. EXAM: PORTABLE CHEST 1 VIEW COMPARISON:  02/24/2015 FINDINGS: Normal cardiac silhouette. There is coarsened central bronchovascular markings. Mild peribronchial cuffing. No focal infiltrate. No pneumothorax. IMPRESSION: Coarsened central bronchovascular markings suggest reactive airway disease or viral process. Electronically Signed   By: Suzy Bouchard M.D.   On: 02/25/2015 10:58     PERTINENT LAB  RESULTS: CBC:  Recent Labs  02/28/15 0345  WBC 13.5*  HGB 17.2*  HCT 51.3  PLT 208   CMET CMP     Component Value Date/Time   NA 140 03/01/2015 0636   K 3.8 03/01/2015 0636   CL 98* 03/01/2015 0636   CO2 33* 03/01/2015 0636   GLUCOSE 100* 03/01/2015 0636   BUN 21* 03/01/2015 0636   CREATININE 0.62 03/01/2015 0636   CALCIUM 8.8* 03/01/2015 0636   PROT 6.9 02/25/2015 0352   ALBUMIN 3.4* 02/25/2015 0352   AST 43* 02/25/2015 0352   ALT 45 02/25/2015 0352   ALKPHOS 122 02/25/2015 0352   BILITOT 0.5 02/25/2015 0352   GFRNONAA >60 03/01/2015 0636   GFRAA >60 03/01/2015 0636    GFR Estimated Creatinine Clearance: 143.2 mL/min (by C-G formula based on Cr of 0.62). No results for input(s): LIPASE, AMYLASE in the last 72 hours. No results for input(s): CKTOTAL, CKMB, CKMBINDEX, TROPONINI in the last 72 hours. Invalid input(s): POCBNP No results for input(s): DDIMER in the last 72 hours. No results for input(s): HGBA1C in the last 72 hours. No results for input(s): CHOL, HDL, LDLCALC, TRIG, CHOLHDL, LDLDIRECT in the last 72 hours. No results for input(s): TSH, T4TOTAL, T3FREE, THYROIDAB in the last 72 hours.  Invalid input(s): FREET3 No results for input(s): VITAMINB12, FOLATE, FERRITIN, TIBC, IRON, RETICCTPCT in the last 72 hours. Coags: No results for input(s): INR in the last 72 hours.  Invalid input(s): PT Microbiology: Recent Results (from the past 240 hour(s))  Culture, blood (routine x 2)     Status: None   Collection Time: 02/24/15 12:54 PM  Result Value Ref Range Status   Specimen Description BLOOD RIGHT HAND  Final   Special Requests BOTTLES DRAWN AEROBIC AND ANAEROBIC 5ML  Final   Culture NO GROWTH 5 DAYS  Final   Report Status 03/01/2015 FINAL  Final  Culture, blood (routine x 2)     Status: None   Collection Time: 02/24/15 12:58 PM  Result Value Ref Range Status   Specimen Description BLOOD LEFT HAND  Final   Special Requests BOTTLES DRAWN AEROBIC AND  ANAEROBIC 5ML  Final   Culture NO GROWTH 5 DAYS  Final   Report Status 03/01/2015 FINAL  Final  MRSA PCR Screening     Status: None   Collection Time: 02/26/15  4:24 AM  Result Value Ref Range Status   MRSA by PCR NEGATIVE NEGATIVE Final    Comment:        The GeneXpert MRSA Assay (FDA approved for NASAL specimens only), is one component of a comprehensive MRSA colonization surveillance program. It is not intended to diagnose MRSA infection nor to guide or monitor treatment for MRSA infections.      BRIEF HOSPITAL COURSE:  Acute respiratory failure with hypoxia: Secondary to COPD exacerbation with bronchitis. Treated with Solu-Medrol, bronchodilators, empiric antibiotics and intermittent doses of Lasix. Significantly improved at the  time of discharge, ambulated in the hallway by RN, O2 saturation mostly in the 90s-suspect does not require oxygen on discharge. Suspect will need home oxygen on discharge. Lower extremity Dopplers negative for DVT, echocardiogram showed mild diastolic dysfunction but preserved ejection fraction  Active Problems: COPD exacerbation/acute bronchitis: Significantly better with steroids, empiric antibiotics, and bronchodilators. At the time of discharge lungs clear without any rhonchi. Suspect underlying COPD given history of tobacco use-have arranged for outpatient follow-up with pulmonology for formal PFTs. In the meantime, will be discharged with rescue inhaler, as well as Dulera and Spiriva. Encouraged tobacco cessation.   Systemic inflammatory response syndrome: Secondary to acute bronchitis, resolved. Blood cultures negative. Completed course of antibiotics.  Hypertension: Controlled-now on low-dose bisoprolol. Continue to follow and adjust accordingly   Tobacco abuse: Counseled.  Obesity: Will likely require sleep study as an outpatient-high suspicion for OSA   TODAY-DAY OF DISCHARGE:  Subjective:   [name] today has no headache,no chest  abdominal pain,no new weakness tingling or numbness, feels much better wants to go home today.   Objective:   Blood pressure 128/77, pulse 91, temperature 98.3 F (36.8 C), temperature source Oral, resp. rate 17, height 6\' 4"  (1.93 m), weight 144.834 kg (319 lb 4.8 oz), SpO2 93 %.  Intake/Output Summary (Last 24 hours) at 03/02/15 1126 Last data filed at 03/01/15 2100  Gross per 24 hour  Intake    560 ml  Output   1550 ml  Net   -990 ml   Filed Weights   02/28/15 0348 03/01/15 0431 03/02/15 0427  Weight: 147.374 kg (324 lb 14.4 oz) 145.695 kg (321 lb 3.2 oz) 144.834 kg (319 lb 4.8 oz)    Exam Awake Alert, Oriented *3, No new F.N deficits, Normal affect [place].AT,PERRAL Supple Neck,No JVD, No cervical lymphadenopathy appriciated.  Symmetrical Chest wall movement, Good air movement bilaterally, CTAB RRR,No Gallops,Rubs or new Murmurs, No Parasternal Heave +ve B.Sounds, Abd Soft, Non tender, No organomegaly appriciated, No rebound -guarding or rigidity. No Cyanosis, Clubbing or edema, No new Rash or bruise  DISCHARGE CONDITION: Stable  DISPOSITION: Home  DISCHARGE INSTRUCTIONS:    Activity:  As tolerated   Get Medicines reviewed and adjusted: Please take all your medications with you for your next visit with your Primary MD  Please request your Primary MD to go over all hospital tests and procedure/radiological results at the follow up, please ask your Primary MD to get all Hospital records sent to his/her office.  If you experience worsening of your admission symptoms, develop shortness of breath, life threatening emergency, suicidal or homicidal thoughts you must seek medical attention immediately by calling 911 or calling your MD immediately  if symptoms less severe.  You must read complete instructions/literature along with all the possible adverse reactions/side effects for all the Medicines you take and that have been prescribed to you. Take any new Medicines after you  have completely understood and accpet all the possible adverse reactions/side effects.   Do not drive when taking Pain medications.   Do not take more than prescribed Pain, Sleep and Anxiety Medications  Special Instructions: If you have smoked or chewed Tobacco  in the last 2 yrs please stop smoking, stop any regular Alcohol  and or any Recreational drug use.  Wear Seat belts while driving.  Please note  You were cared for by a hospitalist during your hospital stay. Once you are discharged, your primary care physician will handle any further medical issues. Please note that NO  REFILLS for any discharge medications will be authorized once you are discharged, as it is imperative that you return to your primary care physician (or establish a relationship with a primary care physician if you do not have one) for your aftercare needs so that they can reassess your need for medications and monitor your lab values.   Diet recommendation: Heart Healthy diet  Discharge Instructions    Call MD for:  difficulty breathing, headache or visual disturbances    Complete by:  As directed      Diet - low sodium heart healthy    Complete by:  As directed      Increase activity slowly    Complete by:  As directed            Follow-up Information    Follow up with Stephens Shire, MD. Go on 03/08/2015.   Specialty:  Family Medicine   Why:  Hospital follow up @ 1130am arrive 20 minutes early   Contact information:   [address] [place] [address] [phone]       Follow up with Rush Landmark, MD On 03/14/2015.   Specialty:  Pulmonary Disease   Why:  Appointment at 4 pm   Contact information:   [address] [place] [address] [phone]      Total Time spent on discharge equals 45 minutes.  SignedOren Binet 03/02/2015 11:26 AM

## 2015-03-02 NOTE — Care Management Important Message (Signed)
Important Message  Patient Details  Name: John Griffin MRN: [id number] Date of Birth: 1950-10-09   Medicare Important Message Given:  Yes    Aleah Ahlgrim Abena 03/02/2015, 11:06 AM

## 2015-03-08 DIAGNOSIS — R000 Tachycardia, unspecified: Secondary | ICD-10-CM | POA: Diagnosis not present

## 2015-03-08 DIAGNOSIS — Z720 Tobacco use: Secondary | ICD-10-CM | POA: Diagnosis not present

## 2015-03-08 DIAGNOSIS — J209 Acute bronchitis, unspecified: Secondary | ICD-10-CM | POA: Diagnosis not present

## 2015-03-08 DIAGNOSIS — I10 Essential (primary) hypertension: Secondary | ICD-10-CM | POA: Diagnosis not present

## 2015-03-08 DIAGNOSIS — D72829 Elevated white blood cell count, unspecified: Secondary | ICD-10-CM | POA: Diagnosis not present

## 2015-03-08 DIAGNOSIS — R6510 Systemic inflammatory response syndrome (SIRS) of non-infectious origin without acute organ dysfunction: Secondary | ICD-10-CM | POA: Diagnosis not present

## 2015-03-08 DIAGNOSIS — J9601 Acute respiratory failure with hypoxia: Secondary | ICD-10-CM | POA: Diagnosis not present

## 2015-03-08 DIAGNOSIS — A419 Sepsis, unspecified organism: Secondary | ICD-10-CM | POA: Diagnosis not present

## 2015-03-14 ENCOUNTER — Encounter: Payer: Self-pay | Admitting: Pulmonary Disease

## 2015-03-14 ENCOUNTER — Ambulatory Visit (INDEPENDENT_AMBULATORY_CARE_PROVIDER_SITE_OTHER): Payer: Medicare Other | Admitting: Pulmonary Disease

## 2015-03-14 VITALS — BP 118/68 | HR 77 | Ht 76.0 in | Wt 324.8 lb

## 2015-03-14 DIAGNOSIS — R0609 Other forms of dyspnea: Secondary | ICD-10-CM

## 2015-03-14 DIAGNOSIS — E669 Obesity, unspecified: Secondary | ICD-10-CM

## 2015-03-14 DIAGNOSIS — J449 Chronic obstructive pulmonary disease, unspecified: Secondary | ICD-10-CM

## 2015-03-14 DIAGNOSIS — G4710 Hypersomnia, unspecified: Secondary | ICD-10-CM | POA: Diagnosis not present

## 2015-03-14 MED ORDER — MOMETASONE FURO-FORMOTEROL FUM 200-5 MCG/ACT IN AERO: 2.0000 | INHALATION_SPRAY | Freq: Two times a day (BID) | RESPIRATORY_TRACT | Status: DC

## 2015-03-14 NOTE — Assessment & Plan Note (Signed)
Recent sob. 2decho (02/2015)  CHFpEF. Likely from COPD and obesity. Will observe.

## 2015-03-14 NOTE — Assessment & Plan Note (Signed)
Weight reduction 

## 2015-03-14 NOTE — Progress Notes (Signed)
Subjective:    Patient ID: John Griffin, male    DOB: 1950-10-23, 65 y.o.   MRN: [id number]  HPI  This is the case of John Griffin, 65 y.o. Male, who was referred by Dr. Sloan Leiter / [facility] in consultation regarding possible copd.  As you very well know, patient has a 45-pack-year smoking history, quit recently. Wife still smokes. . Patient had prostate surgery in July 2016. He had prostate cancer. They remove it. In September 2016, he had a bout of pneumonia. He had a chest x-ray over at [facility] family practice. He had 2 rounds of Z-Pak. He got better after that. He had another bout of pneumonia in November 2016. He took levofloxacin for a week. He got better. He was doing fine until 02/23/2015.  He ended up being admitted at [facility] for a week. He was treated as an acute exacerbation of COPD, possible bacterial pneumonia. He improved. He followed up with his regular doctor last week. He was told he had "infection in his blood" and was given another round of Levaquin.  He feels well overall. He has been on Symbicort 2 puffs twice a day and Dulera 2puffs twice a day. Uses albuterol 2 puffs every 4 hours as needed, has not used it in 3 days. He finished prednisone on February 15.  Not been diagnosed with sleep apnea. Wife who has sleep apnea denies seeing patient having apneic episodes. He has some abnormal breathing at night though. Occasional snoring. No gasping or choking. Has frequent awakening since having a prostate surgery. Denies hypersomnia. No abnormal behavior and sleep.     Review of Systems  Constitutional: Negative.  Negative for fever and unexpected weight change.       No weight gain in one year.  HENT: Negative.  Negative for congestion, dental problem, ear pain, nosebleeds, postnasal drip, rhinorrhea, sinus pressure, sneezing, sore throat and trouble swallowing.   Eyes: Negative.  Negative for redness and itching.  Respiratory: Positive for shortness  of breath. Negative for cough, chest tightness and wheezing.   Cardiovascular: Negative.  Negative for palpitations and leg swelling.  Gastrointestinal: Negative.  Negative for nausea and vomiting.  Endocrine: Negative.   Genitourinary: Negative.  Negative for dysuria.  Musculoskeletal: Negative for joint swelling.  Skin: Negative for rash.  Allergic/Immunologic: Negative.   Neurological: Negative.  Negative for headaches.  Hematological: Bruises/bleeds easily.  Psychiatric/Behavioral: Negative.  Negative for dysphoric mood. The patient is not nervous/anxious.   All other systems reviewed and are negative.      Past Medical History  Diagnosis Date  . Arthritis     hands   . Urinary frequency   . Umbilical hernia   . Prostate cancer ([place])   . Tobacco use   . HTN (hypertension)     denies any history of blood clots.  No family history on file.  Father is diseased and he had emphysema. Mother is alive, she is at a nursing home, has Alzheimer's dementia.  Past Surgical History  Procedure Laterality Date  . Bone graft right ankle   1972  . Right shoulder surgery   1992   . Robot assisted laparoscopic radical prostatectomy N/A 08/17/2014    Procedure: ROBOTIC ASSISTED LAPAROSCOPIC RADICAL PROSTATECTOMY LEVEL 3  AND UMBILICAL HERNIA REPAIR;  Surgeon: Raynelle Bring, MD;  Location: [facility];  Service: Urology;  Laterality: N/A;  . Lymphadenectomy Bilateral 08/17/2014    Procedure: LYMPHADENECTOMY;  Surgeon: Raynelle Bring, MD;  Location: [facility];  Service:  Urology;  Laterality: Bilateral;    Social History   Social History  . Marital Status: Married    Spouse Name: N/A  . Number of Children: N/A  . Years of Education: N/A   Occupational History  . Not on file.   Social History Main Topics  . Smoking status: Former Smoker -- 1.00 packs/day for 40 years    Types: Cigarettes    Quit date: 02/23/2015  . Smokeless tobacco: Never Used  . Alcohol Use: 0.0 oz/week    0 Standard  drinks or equivalent per week     Comment: occasional   . Drug Use: No  . Sexual Activity: Not on file   Other Topics Concern  . Not on file   Social History Narrative   married with 2 children. Occasional alcohol drinking, last drink 5 months ago. Assembled theatre equipment  No Known Allergies   Outpatient Prescriptions Prior to Visit  Medication Sig Dispense Refill  . acetaminophen (TYLENOL) 500 MG tablet Take 1,000 mg by mouth every 6 (six) hours as needed for mild pain.    Marland Kitchen albuterol (PROVENTIL HFA;VENTOLIN HFA) 108 (90 Base) MCG/ACT inhaler Inhale 2 puffs into the lungs every 4 (four) hours as needed for wheezing or shortness of breath. 18 g 0  . aspirin EC 325 MG tablet Take 650 mg by mouth every morning.    . bisoprolol (ZEBETA) 5 MG tablet Take 1 tablet (5 mg total) by mouth daily. 30 tablet 0  . ibuprofen (ADVIL,MOTRIN) 200 MG tablet Take 600 mg by mouth every 6 (six) hours as needed for moderate pain.    . mometasone-formoterol (DULERA) 100-5 MCG/ACT AERO Inhale 2 puffs into the lungs 2 (two) times daily. 13 g 0  . cetirizine (ZYRTEC) 10 MG tablet Take 10 mg by mouth daily. Reported on 03/14/2015    . guaiFENesin (MUCINEX) 600 MG 12 hr tablet Take 600 mg by mouth 2 (two) times daily as needed for cough. Reported on 03/14/2015    . predniSONE (DELTASONE) 10 MG tablet Take 3 tablets (30 mg) daily for 2 days, then, Take 2 tablets (20 mg) daily for 2 days, then, Take 1 tablets (10 mg) daily for 1 days, then stop 11 tablet 0  . tiotropium (SPIRIVA HANDIHALER) 18 MCG inhalation capsule Place 1 capsule (18 mcg total) into inhaler and inhale daily. 30 capsule 0   No facility-administered medications prior to visit.   Meds ordered this encounter  Medications  . DISCONTD: levofloxacin (LEVAQUIN) 750 MG tablet    Sig: Take 750 mg by mouth daily.    Refill:  0  . levofloxacin (LEVAQUIN) 750 MG tablet    Sig: Take 750 mg by mouth daily.  . budesonide-formoterol (SYMBICORT) 160-4.5  MCG/ACT inhaler    Sig: Inhale 2 puffs into the lungs 2 (two) times daily.  . fluticasone (FLONASE) 50 MCG/ACT nasal spray    Sig: Place 2 sprays into both nostrils daily.  . mometasone-formoterol (DULERA) 200-5 MCG/ACT AERO    Sig: Inhale 2 puffs into the lungs 2 (two) times daily.    Dispense:  1 Inhaler    Refill:  5       Objective:   Physical Exam  Vitals:  Filed Vitals:   03/14/15 1558  BP: 118/68  Pulse: 77  Height: 6\' 4"  (1.93 m)  Weight: 324 lb 12.8 oz (147.328 kg)  SpO2: 95%    Constitutional/General:  Pleasant, well-nourished, well-developed, not in any distress,  Comfortably seating.  Well kempt  Body mass index is 39.55 kg/(m^2). Wt Readings from Last 3 Encounters:  03/14/15 324 lb 12.8 oz (147.328 kg)  03/02/15 319 lb 4.8 oz (144.834 kg)  08/17/14 324 lb (146.965 kg)    Neck circumference: 19.5 inches  HEENT: Pupils equal and reactive to light and accommodation. Anicteric sclerae. Normal nasal mucosa.   No oral  lesions,  mouth clear,  oropharynx clear, no postnasal drip. (-) Oral thrush. No dental caries.  Airway - Mallampati class III  Neck: No masses. Midline trachea. No JVD, (-) LAD. (-) bruits appreciated.  Respiratory/Chest: Grossly normal chest. (-) deformity. (-) Accessory muscle use.  Symmetric expansion. (-) Tenderness on palpation.  Resonant on percussion.  Diminished BS on both lower lung zones. (-) wheezing, crackles, rhonchi (-) egophony  Cardiovascular: Regular rate and  rhythm, heart sounds normal, no murmur or gallops, no peripheral edema  Gastrointestinal:  Normal bowel sounds. Soft, non-tender. No hepatosplenomegaly.  (-) masses.   Musculoskeletal:  Normal muscle tone. Normal gait.   Extremities: Grossly normal. (-) clubbing, cyanosis.  (-) edema  Skin: (-) rash,lesions seen.   Neurological/Psychiatric : alert, oriented to time, place, person. Normal mood and affect       Assessment & Plan:  COPD (chronic  obstructive pulmonary disease) ([place]) 45PY smoking history, quit in 02/2015. Wife still smokes. With sob since late 2016. Likely with mod COPD.  Feels better over all. 1. Cont dulera 200 2P BID. He prefers this over symbicort. 2. Cont ventolin prn. 3. Needs PFT. Will need abg. 4. Will need alpha one. 5. Needs cxr. May need a chest ct scan.  6. UTD with vaccines.   Exertional dyspnea Recent sob. 2decho (02/2015)  CHFpEF. Likely from COPD and obesity. Will observe.   Hypersomnia Has hypersomnia, snoring. Wife has osa and uses cpap. Not too sleepy. Will need noc ox -- if (+), will need PSG.   Obesity Weight reduction.      Thank you very much for letting me participate in this patient's care. Please do not hesitate to give me a call if you have any questions or concerns regarding the treatment plan.   Patient will follow up with me in 6-8 weeks.     Monica Becton, MD Pulmonary and [place] Pager: [phone] Office: [phone], Fax: [phone]

## 2015-03-14 NOTE — Assessment & Plan Note (Signed)
Has hypersomnia, snoring. Wife has osa and uses cpap. Not too sleepy. Will need noc ox -- if (+), will need PSG.

## 2015-03-14 NOTE — Assessment & Plan Note (Signed)
45PY smoking history, quit in 02/2015. Wife still smokes. With sob since late 2016. Likely with mod COPD.  Feels better over all. 1. Cont dulera 200 2P BID. He prefers this over symbicort. 2. Cont ventolin prn. 3. Needs PFT. Will need abg. 4. Will need alpha one. 5. Needs cxr. May need a chest ct scan.  6. UTD with vaccines.

## 2015-03-14 NOTE — Patient Instructions (Signed)
1. We will schedule you for a breathing test. 2. We will do chest x ray on next visit. 3. Call if when having shortness of breath or problems breathing before follow-up. 4. Continue with Dulera 2 Puffs 2x/day. Continue ventolin 2 puffs every 4 hours as needed for shortness of breath. 5. We will do a nocturnal oximetry.   Return to clinic in 6-8 weeks.   Monica Becton, MD [place] [facility] Pager [phone] After 3pm or if no response, call [phone] Office: [phone], Fax: [phone]

## 2015-03-23 DIAGNOSIS — C61 Malignant neoplasm of prostate: Secondary | ICD-10-CM | POA: Diagnosis not present

## 2015-03-30 DIAGNOSIS — N5201 Erectile dysfunction due to arterial insufficiency: Secondary | ICD-10-CM | POA: Diagnosis not present

## 2015-03-30 DIAGNOSIS — Z0000 Encounter for general adult medical examination without abnormal findings: Secondary | ICD-10-CM | POA: Diagnosis not present

## 2015-03-30 DIAGNOSIS — N393 Stress incontinence (female) (male): Secondary | ICD-10-CM | POA: Diagnosis not present

## 2015-03-30 DIAGNOSIS — C61 Malignant neoplasm of prostate: Secondary | ICD-10-CM | POA: Diagnosis not present

## 2015-04-04 DIAGNOSIS — R300 Dysuria: Secondary | ICD-10-CM | POA: Diagnosis not present

## 2015-04-04 DIAGNOSIS — N390 Urinary tract infection, site not specified: Secondary | ICD-10-CM | POA: Diagnosis not present

## 2015-04-11 ENCOUNTER — Telehealth: Payer: Self-pay | Admitting: Pulmonary Disease

## 2015-04-11 DIAGNOSIS — G4710 Hypersomnia, unspecified: Secondary | ICD-10-CM

## 2015-04-11 NOTE — Telephone Encounter (Signed)
Per AD pt's ONO shows probable OSA. Pt likely needs sleep study.  Attempted to contact patient regarding results. Left voicemail for patient to return call.

## 2015-04-16 NOTE — Telephone Encounter (Signed)
Spoke with pt and gave results and recommendations. Pt agreeable to sleep study. Order placed.

## 2015-05-03 ENCOUNTER — Ambulatory Visit (INDEPENDENT_AMBULATORY_CARE_PROVIDER_SITE_OTHER): Payer: Medicare Other | Admitting: Pulmonary Disease

## 2015-05-03 DIAGNOSIS — J449 Chronic obstructive pulmonary disease, unspecified: Secondary | ICD-10-CM | POA: Diagnosis not present

## 2015-05-03 LAB — PULMONARY FUNCTION TEST
DL/VA % pred: 98 %
DL/VA: 4.82 ml/min/mmHg/L
DLCO COR: 25.99 ml/min/mmHg
DLCO UNC % PRED: 66 %
DLCO cor % pred: 66 %
DLCO unc: 25.84 ml/min/mmHg
FEF 25-75 Post: 1.89 L/sec
FEF 25-75 Pre: 1.9 L/sec
FEF2575-%Change-Post: 0 %
FEF2575-%PRED-POST: 59 %
FEF2575-%PRED-PRE: 59 %
FEV1-%CHANGE-POST: 1 %
FEV1-%PRED-PRE: 56 %
FEV1-%Pred-Post: 57 %
FEV1-POST: 2.33 L
FEV1-Pre: 2.3 L
FEV1FVC-%Change-Post: -5 %
FEV1FVC-%Pred-Pre: 102 %
FEV6-%CHANGE-POST: 7 %
FEV6-%PRED-PRE: 57 %
FEV6-%Pred-Post: 61 %
FEV6-POST: 3.21 L
FEV6-Pre: 2.99 L
FEV6FVC-%Change-Post: 0 %
FEV6FVC-%PRED-POST: 105 %
FEV6FVC-%PRED-PRE: 105 %
FVC-%Change-Post: 7 %
FVC-%PRED-POST: 58 %
FVC-%Pred-Pre: 54 %
FVC-Post: 3.21 L
POST FEV6/FVC RATIO: 100 %
PRE FEV1/FVC RATIO: 77 %
PRE FEV6/FVC RATIO: 100 %
Post FEV1/FVC ratio: 73 %

## 2015-05-03 NOTE — Progress Notes (Signed)
PFT performed today. 

## 2015-05-09 ENCOUNTER — Encounter: Payer: Self-pay | Admitting: Pulmonary Disease

## 2015-05-09 ENCOUNTER — Ambulatory Visit (INDEPENDENT_AMBULATORY_CARE_PROVIDER_SITE_OTHER): Payer: Medicare Other | Admitting: Pulmonary Disease

## 2015-05-09 ENCOUNTER — Other Ambulatory Visit: Payer: Medicare Other

## 2015-05-09 VITALS — BP 138/78 | HR 102 | Ht 76.0 in | Wt 335.0 lb

## 2015-05-09 DIAGNOSIS — G4733 Obstructive sleep apnea (adult) (pediatric): Secondary | ICD-10-CM | POA: Diagnosis not present

## 2015-05-09 DIAGNOSIS — J449 Chronic obstructive pulmonary disease, unspecified: Secondary | ICD-10-CM | POA: Diagnosis not present

## 2015-05-09 DIAGNOSIS — R0609 Other forms of dyspnea: Secondary | ICD-10-CM

## 2015-05-09 DIAGNOSIS — G4710 Hypersomnia, unspecified: Secondary | ICD-10-CM | POA: Diagnosis not present

## 2015-05-09 DIAGNOSIS — E669 Obesity, unspecified: Secondary | ICD-10-CM

## 2015-05-09 MED ORDER — BUDESONIDE-FORMOTEROL FUMARATE 160-4.5 MCG/ACT IN AERO: 2.0000 | INHALATION_SPRAY | Freq: Two times a day (BID) | RESPIRATORY_TRACT | Status: DC

## 2015-05-09 NOTE — Assessment & Plan Note (Signed)
Recent sob. Better.  2decho (02/2015)  CHFpEF. Likely from COPD and obesity. Will observe.

## 2015-05-09 NOTE — Patient Instructions (Signed)
1. We will do alpha one level today. 2. Schedule home sleep study.  3. Cont symbicort as discussed. Let us know if meds are expensive.   Return to clinic in 6 mos

## 2015-05-09 NOTE — Progress Notes (Signed)
Subjective:    Patient ID: John [facility], male    DOB: [birth year], 64 y.o.   MRN: [id number]  HPI  This is the case of John Griffin, 65 y.o. Male, who was referred by Dr. Sloan [facility] / [facility] in consultation regarding possible copd.  As you very well know, patient has a 45-pack-year smoking history, quit recently. Wife still smokes. . Patient had prostate surgery in July 2016. He had prostate cancer. They remove it. In September 2016, he had a bout of pneumonia. He [facility]. He had 2 rounds of Z-Pak. He got better after that. He had another bout of pneumonia in November 2016. He took levofloxacin for a week. He got better. He was doing fine until 02/23/2015.  He ended up being admitted at [facility] for a [facility]. He was treated as an acute exacerbation of COPD, possible bacterial pneumonia. He improved. He followed up with his regular doctor last week. He was told he had "infection in his blood" and was given another round of Levaquin.  He feels well overall. He has been on Symbicort 2 puffs twice a day and Dulera 2puffs twice a day. Uses albuterol 2 puffs every 4 hours as needed, has not used it in 3 days. He finished prednisone on February 15.  Not been diagnosed with sleep apnea. Wife who has sleep apnea denies seeing patient having apneic episodes. He has some abnormal breathing at night though. Occasional snoring. No gasping or choking. Has frequent awakening since having a prostate surgery. Denies hypersomnia. No abnormal behavior and sleep.   ROV 05/09/15 Pt f/u on copd. Has been doing fair since last seen. Has not beed admitted nor been on abx since last seen. SOB better.    Review of Systems  Constitutional: Negative.  Negative for fever and unexpected weight change.       No weight gain in one year.  HENT: Negative.  Negative for congestion, dental problem, ear pain, nosebleeds, postnasal drip, rhinorrhea, sinus pressure,  sneezing, sore throat and trouble swallowing.   Eyes: Negative.  Negative for redness and itching.  Respiratory: Positive for shortness of breath. Negative for cough, chest tightness and wheezing.   Cardiovascular: Negative.  Negative for palpitations and leg swelling.  Gastrointestinal: Negative.  Negative for nausea and vomiting.  Endocrine: Negative.   Genitourinary: Negative.  Negative for dysuria.  Musculoskeletal: Negative for joint swelling.  Skin: Negative for rash.  Allergic/Immunologic: Negative.   Neurological: Negative.  Negative for headaches.  Hematological: Bruises/bleeds easily.  Psychiatric/Behavioral: Negative.  Negative for dysphoric mood. The patient is not nervous/anxious.   All other systems reviewed and are negative.    Objective:   Physical Exam  Vitals:  Filed Vitals:   05/09/15 1348  BP: 138/78  Pulse: 102  Height: 6\' 4"  (1.93 m)  Weight: 335 lb (151.955 kg)  SpO2: 93%    Constitutional/General:  Pleasant, well-nourished, well-developed, not in any distress,  Comfortably seating.  Well kempt obese  Body mass index is 40.79 kg/(m^2). Wt Readings from Last 3 Encounters:  05/09/15 335 lb (151.955 kg)  03/14/15 324 lb 12.8 oz (147.328 kg)  03/02/15 319 lb 4.8 oz (144.834 kg)    Neck circumference: 19.5 inches  HEENT: Pupils equal and reactive to light and accommodation. Anicteric sclerae. Normal nasal mucosa.   No oral  lesions,  mouth clear,  oropharynx clear, no postnasal drip. (-) Oral thrush. No dental caries.  Airway - Mallampati class III  Neck: No masses. Midline trachea. No JVD, (-) LAD. (-) bruits appreciated.  Respiratory/Chest: Grossly normal chest. (-) deformity. (-) Accessory muscle use.  Symmetric expansion. (-) Tenderness on palpation.  Resonant on percussion.  Diminished BS on both lower lung zones. (-) wheezing,  Rhonchi. Crackles at the bottom (-) egophony  Cardiovascular: Regular rate and  rhythm, heart sounds normal,  no murmur or gallops, no peripheral edema  Gastrointestinal:  Normal bowel sounds. Soft, non-tender. No hepatosplenomegaly.  (-) masses.   Musculoskeletal:  Normal muscle tone. Normal gait.   Extremities: Grossly normal. (-) clubbing, cyanosis.  (-) edema  Skin: (-) rash,lesions seen.   Neurological/Psychiatric : alert, oriented to time, place, person. Normal mood and affect       Assessment & Plan:  COPD (chronic obstructive pulmonary disease) ([place]) 45PY smoking history, quit in 02/2015. Wife still smokes. PFT (04/2015)  FEV1  2.30  56%.  DLCO 66%  1. Cont dulera 200 2P BID. He prefers this over symbicort. 2. Cont ventolin prn. 3. Needs PFT. Will need abg. 4. Will need alpha one. 5. Needs cxr. May need a chest ct scan.  6. UTD with vaccines.  7. May need neb meds.    Obesity Weight reduction.     Hypersomnia Has hypersomnia, snoring. Wife has osa and uses cpap. Not too sleepy. Will need noc ox -- if (+), will need PSG.     Exertional dyspnea Recent sob. Better.  2decho (02/2015)  CHFpEF. Likely from COPD and obesity. Will observe.       Return to clinic in 6 mos.         John Becton, MD [facility] and [place] Pager: [phone] Office: [phone], Fax: [phone]

## 2015-05-09 NOTE — Assessment & Plan Note (Signed)
Weight reduction 

## 2015-05-09 NOTE — Assessment & Plan Note (Addendum)
45PY smoking history, quit in 02/2015. Wife still smokes. PFT (04/2015)  FEV1  2.30  56%.  DLCO 66% CXR (02/2015)  RVD.   1. Cont dulera 200 2P BID or symbicort 160 2P BID. If MDIs are expensive, will switch to neb meds. Pt will call.  2. Cont ventolin prn. 3. Mentioned regarding pulm rehab > he wants to hold off 4. Will need alpha one. 6. UTD with vaccines. Needs prevnar 13 in 02/2016 7. May need neb meds.

## 2015-05-09 NOTE — Assessment & Plan Note (Addendum)
Has hypersomnia, snoring. Wife has osa and uses cpap. Not too sleepy. ONO was abn. Plan for HST

## 2015-05-10 ENCOUNTER — Ambulatory Visit: Payer: No Typology Code available for payment source | Admitting: Pulmonary Disease

## 2015-05-14 ENCOUNTER — Telehealth: Payer: Self-pay | Admitting: Pulmonary Disease

## 2015-05-14 DIAGNOSIS — G4733 Obstructive sleep apnea (adult) (pediatric): Secondary | ICD-10-CM

## 2015-05-14 LAB — ALPHA-1 ANTITRYPSIN PHENOTYPE: A1 ANTITRYPSIN: 180 mg/dL (ref 83–199)

## 2015-05-14 NOTE — Telephone Encounter (Signed)
Spoke with pt and gave results and recommendations. Pt agrees to start CPAP therapy. Order placed. Pt aware to call office and schedule f/u appt once starts CPAP. Nothing further needed.  

## 2015-05-14 NOTE — Telephone Encounter (Signed)
  Please call the pt and tell the pt the [place]  showed  Severe OSA   Pt stops breathing  41   times an hour.   Please order autoCPAP 5-15 cm H2O. Patient will need a mask fitting session. Patient will need a 1 month download.   Patient needs to be seen 4-6 weeks after obtaining the cpap machine. Let me know if you receive this.   Thanks!   J. Shirl Harris, MD 05/14/2015, 3:23 PM

## 2015-05-15 ENCOUNTER — Other Ambulatory Visit: Payer: Self-pay | Admitting: *Deleted

## 2015-05-15 DIAGNOSIS — G4710 Hypersomnia, unspecified: Secondary | ICD-10-CM

## 2015-06-06 DIAGNOSIS — H903 Sensorineural hearing loss, bilateral: Secondary | ICD-10-CM | POA: Diagnosis not present

## 2015-06-27 ENCOUNTER — Encounter: Payer: Self-pay | Admitting: Adult Health

## 2015-06-27 ENCOUNTER — Ambulatory Visit (INDEPENDENT_AMBULATORY_CARE_PROVIDER_SITE_OTHER): Payer: Medicare Other | Admitting: Adult Health

## 2015-06-27 VITALS — BP 134/84 | HR 78 | Temp 97.8°F | Ht 76.0 in | Wt 338.0 lb

## 2015-06-27 DIAGNOSIS — J449 Chronic obstructive pulmonary disease, unspecified: Secondary | ICD-10-CM | POA: Diagnosis not present

## 2015-06-27 DIAGNOSIS — G4733 Obstructive sleep apnea (adult) (pediatric): Secondary | ICD-10-CM | POA: Diagnosis not present

## 2015-06-27 NOTE — Assessment & Plan Note (Signed)
  Continue on Symbicort 2 puffs Twice daily  , rinse after use.  follow up .Dr. Corrie Dandy in 4 months as planned and As needed

## 2015-06-27 NOTE — Assessment & Plan Note (Signed)
Severe OSA well controlled on CPAP   Plan  Great Job keep up good work  Wear CPAP At bedtime   Work on weight loss.  Do not drive if sleepy.  follow up .Dr. Corrie Dandy in 4 months as planned and As needed

## 2015-06-27 NOTE — Progress Notes (Signed)
Subjective:    Patient ID: John [facility], male    DOB: 1950-06-05, 65 y.o.   MRN: [id number]  HPI 65 yo male former smoker with Mod to Severe  COPD and Severe OSA   06/27/2015 Follow up : COPD and OSA  Pt returns for 2 month follow up . He recently had a sleep study that showed severe OSA with AHI at 41 /hr  He was started on CPAP . He says he is doing very well. Feels more rested. Sleeps much better.  Download showed excellent compliance with avg usage w/ 7.5hr . AHI 5.6,  + leaks but does not disturb sleep.  He is on autoset 5-15cmH2O .  We discussed weight loss.   Remains on Symbicort. Denies flare of cough or dyspnea.  Denies chest pain, orthopnea, edema or hemoptysis .    Past Medical History  Diagnosis Date  . Arthritis     hands   . Urinary frequency   . Umbilical hernia   . Prostate cancer ([place])   . Tobacco use   . HTN (hypertension)    Current Outpatient Prescriptions on File Prior to Visit  Medication Sig Dispense Refill  . acetaminophen (TYLENOL) 500 MG tablet Take 1,000 mg by mouth every 6 (six) hours as needed for mild pain. Reported on 05/09/2015    . albuterol (PROVENTIL HFA;VENTOLIN HFA) 108 (90 Base) MCG/ACT inhaler Inhale 2 puffs into the lungs every 4 (four) hours as needed for wheezing or shortness of breath. 18 g 0  . aspirin EC 325 MG tablet Take 650 mg by mouth every morning.    . budesonide-formoterol (SYMBICORT) 160-4.5 MCG/ACT inhaler Inhale 2 puffs into the lungs 2 (two) times daily. 1 Inhaler 5  . cetirizine (ZYRTEC) 10 MG tablet Take 10 mg by mouth daily. Reported on 03/14/2015    . fluticasone (FLONASE) 50 MCG/ACT nasal spray Place 2 sprays into both nostrils daily.    Marland Kitchen guaiFENesin (MUCINEX) 600 MG 12 hr tablet Take 600 mg by mouth 2 (two) times daily as needed for cough. Reported on 05/09/2015    . ibuprofen (ADVIL,MOTRIN) 200 MG tablet Take 600 mg by mouth every 6 (six) hours as needed for moderate pain.     No current facility-administered  medications on file prior to visit.     Review of Systems Constitutional:   No  weight loss, night sweats,  Fevers, chills, + fatigue, or  lassitude.  HEENT:   No headaches,  Difficulty swallowing,  Tooth/dental problems, or  Sore throat,                No sneezing, itching, ear ache, nasal congestion, post nasal drip,   CV:  No chest pain,  Orthopnea, PND, swelling in lower extremities, anasarca, dizziness, palpitations, syncope.   GI  No heartburn, indigestion, abdominal pain, nausea, vomiting, diarrhea, change in bowel habits, loss of appetite, bloody stools.   Resp: No shortness of breath with exertion or at rest.  No excess mucus, no productive cough,  No non-productive cough,  No coughing up of blood.  No change in color of mucus.  No wheezing.  No chest wall deformity  Skin: no rash or lesions.  GU: no dysuria, change in color of urine, no urgency or frequency.  No flank pain, no hematuria   MS:  No joint pain or swelling.  No decreased range of motion.  No back pain.  Psych:  No change in mood or affect. No depression or anxiety.  No  memory loss.         Objective:   Physical Exam   Filed Vitals:   06/27/15 0851 06/27/15 0852  BP:  134/84  Pulse:  78  Temp: 97.8 F (36.6 C)   TempSrc: Oral   Height: 6\' 4"  (1.93 m)   Weight: 338 lb (153.316 kg)   SpO2:  93%  Body mass index is 41.16 kg/(m^2).   GEN: A/Ox3; pleasant , NAD, obese   HEENT:  [place]/AT,  EACs-clear, TMs-wnl, NOSE-clear, THROAT-clear, no lesions, no postnasal drip or exudate noted. , [place] , class 2-3 MP airway   NECK:  Supple w/ fair ROM; no JVD; normal carotid impulses w/o bruits; no thyromegaly or nodules palpated; no lymphadenopathy.  RESP  Clear  P & A; w/o, wheezes/ rales/ or rhonchi.no accessory muscle use, no dullness to percussion  CARD:  RRR, no m/r/g  , no  peripheral edema, pulses intact, no cyanosis or clubbing.  GI:   Soft & nt; nml bowel sounds; no organomegaly or masses  detected.  Musco: Warm bil, no deformities or joint swelling noted.   Neuro: alert, no focal deficits noted.    Skin: Warm, no lesions or rashes  Dan Scearce NP-C  [facility]  06/27/2015      Assessment & Plan:

## 2015-06-27 NOTE — Patient Instructions (Signed)
Great Job keep up good work  Wear CPAP At bedtime   Work on weight loss.  Do not drive if sleepy.  Continue on Symbicort 2 puffs Twice daily  , rinse after use.  follow up .Dr. Corrie Dandy in 4 months as planned and As needed

## 2015-10-24 ENCOUNTER — Ambulatory Visit (INDEPENDENT_AMBULATORY_CARE_PROVIDER_SITE_OTHER): Payer: Medicare Other | Admitting: Pulmonary Disease

## 2015-10-24 ENCOUNTER — Encounter: Payer: Self-pay | Admitting: Pulmonary Disease

## 2015-10-24 DIAGNOSIS — G4733 Obstructive sleep apnea (adult) (pediatric): Secondary | ICD-10-CM | POA: Diagnosis not present

## 2015-10-24 DIAGNOSIS — Z23 Encounter for immunization: Secondary | ICD-10-CM | POA: Diagnosis not present

## 2015-10-24 DIAGNOSIS — J430 Unilateral pulmonary emphysema [MacLeod's syndrome]: Secondary | ICD-10-CM | POA: Diagnosis not present

## 2015-10-24 NOTE — Assessment & Plan Note (Addendum)
45PY smoking history, quit in 02/2015. Wife still smokes. PFT (04/2015)  FEV1  2.30  56%.  DLCO 66% CXR (02/2015)  RVD.   1. Cont  symbicort 160 2P BID. 2. Cont ventolin prn. 3. Mentioned regarding pulm rehab > he wants to hold off 4. Flu shot today.  5. Got PNA 23 in 02/2015. Needs prevnar 13 in 02/2016

## 2015-10-24 NOTE — Progress Notes (Signed)
Subjective:    Patient ID: [facility], male    DOB: November 07, 1950, 65 y.o.   MRN: [id number]  HPI  This is the case of John [facility], 65 y.o. Male, who was referred by Dr. Sloan Leiter / [facility] in consultation regarding possible copd.  As you very well know, patient has a 45-pack-year smoking history, quit recently. Wife still smokes. . Patient had prostate surgery in July 2016. He had prostate cancer. They remove it. In September 2016, he had a bout of pneumonia. He had a chest x-ray over at [facility] practice. He had 2 rounds of Z-Pak. He got better after that. He had another bout of pneumonia in November 2016. He took levofloxacin for a week. He got better. He was doing fine until 02/23/2015.  He ended up being admitted at [facility] for a week. He was treated as an acute exacerbation of COPD, possible bacterial pneumonia. He improved. He followed up with his regular doctor last week. He was told he had "infection in his blood" and was given another round of Levaquin.  He feels well overall. He has been on Symbicort 2 puffs twice a day and Dulera 2puffs twice a day. Uses albuterol 2 puffs every 4 hours as needed, has not used it in 3 days. He finished prednisone on February 15.  Not been diagnosed with sleep apnea. Wife who has sleep apnea denies seeing patient having apneic episodes. He has some abnormal breathing at night though. Occasional snoring. No gasping or choking. Has frequent awakening since having a prostate surgery. Denies hypersomnia. No abnormal behavior and sleep.  ROV 05/09/15 Pt f/u on copd. Has been doing fair since last seen. Has not beed admitted nor been on abx since last seen. SOB better.     ROV 10/24/15 Pt returns to office as f/u on his copd and osa. Since last seen, he had a HST in 06/2015 which showed severe osa.  He is using autocpap. Feels better using it. No issues except for heated humidity > too warm. Uses symbicort for copd. Feels better using  it. Has not been admitted nor has been on abx since last seen.    Review of Systems  Constitutional: Negative.  Negative for fever and unexpected weight change.       No weight gain in one year.  HENT: Negative.  Negative for congestion, dental problem, ear pain, nosebleeds, postnasal drip, rhinorrhea, sinus pressure, sneezing, sore throat and trouble swallowing.   Eyes: Negative.  Negative for redness and itching.  Respiratory: Positive for shortness of breath. Negative for cough, chest tightness and wheezing.   Cardiovascular: Negative.  Negative for palpitations and leg swelling.  Gastrointestinal: Negative.  Negative for nausea and vomiting.  Endocrine: Negative.   Genitourinary: Negative.  Negative for dysuria.  Musculoskeletal: Negative for joint swelling.  Skin: Negative for rash.  Allergic/Immunologic: Negative.   Neurological: Negative.  Negative for headaches.  Hematological: Bruises/bleeds easily.  Psychiatric/Behavioral: Negative.  Negative for dysphoric mood. The patient is not nervous/anxious.   All other systems reviewed and are negative.    Objective:   Physical Exam  Vitals:  Vitals:   10/24/15 0904  BP: 122/78  Pulse: 89  SpO2: 95%  Weight: (!) 334 lb 6.4 oz (151.7 kg)  Height: 6\' 4"  (1.93 m)    Constitutional/General:  Pleasant, well-nourished, well-developed, not in any distress,  Comfortably seating.  Well kempt obese  Body mass index is 40.7 kg/m. Wt Readings from Last 3 Encounters:  10/24/15 Marland Kitchen)  334 lb 6.4 oz (151.7 kg)  06/27/15 (!) 338 lb (153.3 kg)  05/09/15 (!) 335 lb (152 kg)    Neck circumference: 19.5 inches  HEENT: Pupils equal and reactive to light and accommodation. Anicteric sclerae. Normal nasal mucosa.   No oral  lesions,  mouth clear,  oropharynx clear, no postnasal drip. (-) Oral thrush. No dental caries.  Airway - Mallampati class III  Neck: No masses. Midline trachea. No JVD, (-) LAD. (-) bruits  appreciated.  Respiratory/Chest: Grossly normal chest. (-) deformity. (-) Accessory muscle use.  Symmetric expansion. (-) Tenderness on palpation.  Resonant on percussion.  Diminished BS on both lower lung zones. (-) wheezing,  Rhonchi. Crackles at the bottom (-) egophony  Cardiovascular: Regular rate and  rhythm, heart sounds normal, no murmur or gallops, no peripheral edema  Gastrointestinal:  Normal bowel sounds. Soft, non-tender. No hepatosplenomegaly.  (-) masses.   Musculoskeletal:  Normal muscle tone. Normal gait.   Extremities: Grossly normal. (-) clubbing, cyanosis.  (-) edema  Skin: (-) rash,lesions seen.   Neurological/Psychiatric : alert, oriented to time, place, person. Normal mood and affect       Assessment & Plan:  COPD (chronic obstructive pulmonary disease) ([place]) 45PY smoking history, quit in 02/2015. Wife still smokes. PFT (04/2015)  FEV1  2.30  56%.  DLCO 66% CXR (02/2015)  RVD.   1. Cont  symbicort 160 2P BID. 2. Cont ventolin prn. 3. Mentioned regarding pulm rehab > he wants to hold off 4. Flu shot today.  5. Got PNA 23 in 02/2015. Needs prevnar 13 in 02/2016    OSA (obstructive sleep apnea) Has hypersomnia, snoring. Wife has osa and uses cpap. HST (06/2015)  AHI 44.  On autocpap 5-15 cm water.  Feels better using CPAP. More energy. Less sleepiness. Download the last month (10/23/2015): 100%, AHI 2.1. Need to adjust the humidity temperature > will contact DME. Too warm.   Obesity Weight reduction   Return to clinic in 4 mos.         Monica Becton, MD Pulmonary and [place] Pager: [phone] Office: [phone], Fax: [phone]

## 2015-10-24 NOTE — Assessment & Plan Note (Signed)
Weight reduction 

## 2015-10-24 NOTE — Patient Instructions (Signed)
It was a pleasure taking care of you today!  1. You are diagnosed with Chronic Obstructive Pulmonary Disease or COPD.  COPD is a preventable and treatable disease that makes it difficult to empty air out of the lungs (airflow obstruction).  This can lead to shortness of breath.   Sometimes, when you have a lung infection, this can make your breathing worse, and will cause you to have a COPD flare-up or an acute exacerbation of COPD. Please call your primary care doctor or the office if you are having a COPD flare-up.   Smoking makes COPD worse.   Make sure you use your medications for COPD -- Maintenance medications : Symbicort 160/4.5 2P, 2x/day  Rescue medications: Albuterol 2 puffs every 4 hours as needed for shortness of breath.   Please rinse your mouth each time you use your maintenance medication.  Please call the office if you are having issues with your medications   2. Continue using your CPAP machine.   Please make sure you use your CPAP device everytime you sleep.  We will monitor the usage of your machine per your insurance requirement.  Your insurance company may take the machine from you if you are not using it regularly.   Please clean the mask, tubings, filter, water reservoir with soapy water every week.  Please use distilled water for the water reservoir.   Please call the office or your machine provider (DME company) if you are having issues with the device.   Return to clinic in 4 months with Dr. Corrie Dandy

## 2015-10-24 NOTE — Assessment & Plan Note (Addendum)
Has hypersomnia, snoring. Wife has osa and uses cpap. HST (06/2015)  AHI 44.  On autocpap 5-15 cm water.  Feels better using CPAP. More energy. Less sleepiness. Download the last month (10/23/2015): 100%, AHI 2.1. Need to adjust the humidity temperature > will contact DME. Too warm.

## 2015-10-30 ENCOUNTER — Encounter: Payer: Self-pay | Admitting: Pulmonary Disease

## 2015-10-30 DIAGNOSIS — G4733 Obstructive sleep apnea (adult) (pediatric): Secondary | ICD-10-CM

## 2015-10-31 DIAGNOSIS — C61 Malignant neoplasm of prostate: Secondary | ICD-10-CM | POA: Diagnosis not present

## 2015-11-06 NOTE — Progress Notes (Signed)
Pt had a mask fitting session on 10/30/15.   Monica Becton, MD 11/06/2015, 3:52 PM [place] [facility] Pager [phone] After 3 pm or if no answer, call [phone]

## 2015-11-06 NOTE — Procedures (Signed)
    NAME: [facility] DATE OF BIRTH:  August 27, 1950 MEDICAL RECORD NUMBER [id number]  LOCATION: [facility]  PHYSICIAN: [place] OF STUDY: 10/30/2015  SLEEP STUDY TYPE: Out of Center Sleep Test                Pt had a mask fitting session on 10/30/15.  Monica Becton, MD 11/06/2015, 3:53 PM [place] [facility] Pager [phone] After 3 pm or if no answer, call [phone]

## 2015-11-07 DIAGNOSIS — C61 Malignant neoplasm of prostate: Secondary | ICD-10-CM | POA: Diagnosis not present

## 2015-11-07 DIAGNOSIS — N393 Stress incontinence (female) (male): Secondary | ICD-10-CM | POA: Diagnosis not present

## 2016-01-29 DIAGNOSIS — R9431 Abnormal electrocardiogram [ECG] [EKG]: Secondary | ICD-10-CM | POA: Diagnosis not present

## 2016-01-29 DIAGNOSIS — R079 Chest pain, unspecified: Secondary | ICD-10-CM | POA: Diagnosis not present

## 2016-01-29 DIAGNOSIS — K219 Gastro-esophageal reflux disease without esophagitis: Secondary | ICD-10-CM | POA: Diagnosis not present

## 2016-02-03 ENCOUNTER — Inpatient Hospital Stay (HOSPITAL_COMMUNITY)
Admission: EM | Admit: 2016-02-03 | Discharge: 2016-02-06 | DRG: 280 | Disposition: A | Discharge status: Home / Self Care | Payer: Medicare Other | Attending: Cardiology | Admitting: Cardiology

## 2016-02-03 ENCOUNTER — Emergency Department (HOSPITAL_COMMUNITY): Payer: Medicare Other

## 2016-02-03 ENCOUNTER — Encounter (HOSPITAL_COMMUNITY): Payer: Self-pay | Admitting: *Deleted

## 2016-02-03 DIAGNOSIS — I110 Hypertensive heart disease with heart failure: Secondary | ICD-10-CM | POA: Diagnosis present

## 2016-02-03 DIAGNOSIS — I959 Hypotension, unspecified: Secondary | ICD-10-CM | POA: Diagnosis not present

## 2016-02-03 DIAGNOSIS — I214 Non-ST elevation (NSTEMI) myocardial infarction: Principal | ICD-10-CM | POA: Diagnosis present

## 2016-02-03 DIAGNOSIS — I2510 Atherosclerotic heart disease of native coronary artery without angina pectoris: Secondary | ICD-10-CM | POA: Diagnosis not present

## 2016-02-03 DIAGNOSIS — I5041 Acute combined systolic (congestive) and diastolic (congestive) heart failure: Secondary | ICD-10-CM | POA: Diagnosis not present

## 2016-02-03 DIAGNOSIS — Z8546 Personal history of malignant neoplasm of prostate: Secondary | ICD-10-CM | POA: Diagnosis not present

## 2016-02-03 DIAGNOSIS — J449 Chronic obstructive pulmonary disease, unspecified: Secondary | ICD-10-CM | POA: Diagnosis present

## 2016-02-03 DIAGNOSIS — I25119 Atherosclerotic heart disease of native coronary artery with unspecified angina pectoris: Secondary | ICD-10-CM | POA: Diagnosis present

## 2016-02-03 DIAGNOSIS — I5021 Acute systolic (congestive) heart failure: Secondary | ICD-10-CM | POA: Diagnosis present

## 2016-02-03 DIAGNOSIS — Z7951 Long term (current) use of inhaled steroids: Secondary | ICD-10-CM

## 2016-02-03 DIAGNOSIS — Z7982 Long term (current) use of aspirin: Secondary | ICD-10-CM

## 2016-02-03 DIAGNOSIS — Z720 Tobacco use: Secondary | ICD-10-CM | POA: Diagnosis present

## 2016-02-03 DIAGNOSIS — G4733 Obstructive sleep apnea (adult) (pediatric): Secondary | ICD-10-CM | POA: Diagnosis not present

## 2016-02-03 DIAGNOSIS — F17210 Nicotine dependence, cigarettes, uncomplicated: Secondary | ICD-10-CM | POA: Diagnosis present

## 2016-02-03 DIAGNOSIS — Z6841 Body Mass Index (BMI) 40.0 and over, adult: Secondary | ICD-10-CM | POA: Diagnosis not present

## 2016-02-03 DIAGNOSIS — R079 Chest pain, unspecified: Secondary | ICD-10-CM | POA: Diagnosis not present

## 2016-02-03 DIAGNOSIS — Z9079 Acquired absence of other genital organ(s): Secondary | ICD-10-CM

## 2016-02-03 DIAGNOSIS — R0602 Shortness of breath: Secondary | ICD-10-CM | POA: Diagnosis not present

## 2016-02-03 DIAGNOSIS — I509 Heart failure, unspecified: Secondary | ICD-10-CM

## 2016-02-03 DIAGNOSIS — I255 Ischemic cardiomyopathy: Secondary | ICD-10-CM | POA: Diagnosis present

## 2016-02-03 HISTORY — DX: Chronic obstructive pulmonary disease, unspecified: J44.9

## 2016-02-03 LAB — CBC
HCT: 47.2 % (ref 39.0–52.0)
HEMOGLOBIN: 16.1 g/dL (ref 13.0–17.0)
MCH: 32.1 pg (ref 26.0–34.0)
MCHC: 34.1 g/dL (ref 30.0–36.0)
MCV: 94 fL (ref 78.0–100.0)
Platelets: 241 10*3/uL (ref 150–400)
RBC: 5.02 MIL/uL (ref 4.22–5.81)
RDW: 13.7 % (ref 11.5–15.5)
WBC: 12.7 10*3/uL — ABNORMAL HIGH (ref 4.0–10.5)

## 2016-02-03 LAB — MRSA PCR SCREENING: MRSA by PCR: NEGATIVE

## 2016-02-03 LAB — HEPATIC FUNCTION PANEL
ALT: 56 U/L (ref 17–63)
AST: 63 U/L — ABNORMAL HIGH (ref 15–41)
Albumin: 3.1 g/dL — ABNORMAL LOW (ref 3.5–5.0)
Alkaline Phosphatase: 155 U/L — ABNORMAL HIGH (ref 38–126)
BILIRUBIN INDIRECT: 1.1 mg/dL — AB (ref 0.3–0.9)
Bilirubin, Direct: 0.4 mg/dL (ref 0.1–0.5)
TOTAL PROTEIN: 7.6 g/dL (ref 6.5–8.1)
Total Bilirubin: 1.5 mg/dL — ABNORMAL HIGH (ref 0.3–1.2)

## 2016-02-03 LAB — BRAIN NATRIURETIC PEPTIDE: B Natriuretic Peptide: 412.4 pg/mL — ABNORMAL HIGH (ref 0.0–100.0)

## 2016-02-03 LAB — BASIC METABOLIC PANEL
ANION GAP: 13 (ref 5–15)
BUN: 9 mg/dL (ref 6–20)
CALCIUM: 9.4 mg/dL (ref 8.9–10.3)
CO2: 25 mmol/L (ref 22–32)
Chloride: 100 mmol/L — ABNORMAL LOW (ref 101–111)
Creatinine, Ser: 0.68 mg/dL (ref 0.61–1.24)
GFR calc non Af Amer: >60 mL/min (ref >60)
GLUCOSE: 117 mg/dL — AB (ref 65–99)
POTASSIUM: 3.5 mmol/L (ref 3.5–5.1)
Sodium: 138 mmol/L (ref 135–145)

## 2016-02-03 LAB — I-STAT TROPONIN, ED
TROPONIN I, POC: 10.33 ng/mL — AB (ref 0.00–0.08)
TROPONIN I, POC: 9.23 ng/mL — AB (ref 0.00–0.08)

## 2016-02-03 LAB — PROTIME-INR
INR: 1.25
Prothrombin Time: 15.7 seconds — ABNORMAL HIGH (ref 11.4–15.2)

## 2016-02-03 LAB — TROPONIN I: Troponin I: 5.55 ng/mL (ref <0.03)

## 2016-02-03 LAB — TSH: TSH: 3.131 u[IU]/mL (ref 0.350–4.500)

## 2016-02-03 MED ORDER — MOMETASONE FURO-FORMOTEROL FUM 200-5 MCG/ACT IN AERO
2.0000 | INHALATION_SPRAY | Freq: Two times a day (BID) | RESPIRATORY_TRACT | Status: DC
  Administered 2016-02-03 – 2016-02-04 (×2): 2 via RESPIRATORY_TRACT
  Filled 2016-02-03: qty 8.8

## 2016-02-03 MED ORDER — FUROSEMIDE 10 MG/ML IJ SOLN
40.0000 mg | Freq: Two times a day (BID) | INTRAMUSCULAR | Status: DC
  Administered 2016-02-03 – 2016-02-04 (×2): 40 mg via INTRAVENOUS
  Filled 2016-02-03 (×2): qty 4

## 2016-02-03 MED ORDER — HEPARIN BOLUS VIA INFUSION
4000.0000 [IU] | Freq: Once | INTRAVENOUS | Status: AC
  Administered 2016-02-03: 4000 [IU] via INTRAVENOUS
  Filled 2016-02-03: qty 4000

## 2016-02-03 MED ORDER — NITROGLYCERIN IN D5W 200-5 MCG/ML-% IV SOLN
3.0000 ug/min | INTRAVENOUS | Status: DC
  Administered 2016-02-03: 3 ug/min via INTRAVENOUS
  Filled 2016-02-03: qty 250

## 2016-02-03 MED ORDER — FUROSEMIDE 10 MG/ML IJ SOLN
40.0000 mg | Freq: Once | INTRAMUSCULAR | Status: AC
  Administered 2016-02-03: 40 mg via INTRAVENOUS
  Filled 2016-02-03: qty 4

## 2016-02-03 MED ORDER — ALBUTEROL SULFATE HFA 108 (90 BASE) MCG/ACT IN AERS: 2.0000 | INHALATION_SPRAY | RESPIRATORY_TRACT | Status: DC | PRN

## 2016-02-03 MED ORDER — FLUTICASONE PROPIONATE 50 MCG/ACT NA SUSP
1.0000 | Freq: Every day | NASAL | Status: DC
  Administered 2016-02-04: 1 via NASAL
  Filled 2016-02-03: qty 16

## 2016-02-03 MED ORDER — SODIUM CHLORIDE 0.9% FLUSH
3.0000 mL | Freq: Two times a day (BID) | INTRAVENOUS | Status: DC
  Administered 2016-02-03 – 2016-02-04 (×2): 3 mL via INTRAVENOUS

## 2016-02-03 MED ORDER — ALBUTEROL SULFATE (2.5 MG/3ML) 0.083% IN NEBU
2.5000 mg | INHALATION_SOLUTION | RESPIRATORY_TRACT | Status: DC | PRN
  Administered 2016-02-03: 2.5 mg via RESPIRATORY_TRACT
  Filled 2016-02-03: qty 3

## 2016-02-03 MED ORDER — SODIUM CHLORIDE 0.9% FLUSH: 3.0000 mL | INTRAVENOUS | Status: DC | PRN

## 2016-02-03 MED ORDER — SODIUM CHLORIDE 0.9 % IV SOLN
INTRAVENOUS | Status: DC
  Administered 2016-02-03: 15:00:00 via INTRAVENOUS

## 2016-02-03 MED ORDER — PANTOPRAZOLE SODIUM 40 MG PO TBEC
40.0000 mg | DELAYED_RELEASE_TABLET | Freq: Every day | ORAL | Status: DC
  Administered 2016-02-03 – 2016-02-04 (×2): 40 mg via ORAL
  Filled 2016-02-03 (×2): qty 1

## 2016-02-03 MED ORDER — BISOPROLOL FUMARATE 5 MG PO TABS
2.5000 mg | ORAL_TABLET | Freq: Every day | ORAL | Status: DC
  Administered 2016-02-03 – 2016-02-04 (×2): 2.5 mg via ORAL
  Filled 2016-02-03 (×2): qty 1

## 2016-02-03 MED ORDER — CALCIUM CARBONATE ANTACID 500 MG PO CHEW: 3.0000 | CHEWABLE_TABLET | ORAL | Status: DC | PRN

## 2016-02-03 MED ORDER — SODIUM CHLORIDE 0.9% FLUSH: 3.0000 mL | Freq: Two times a day (BID) | INTRAVENOUS | Status: DC

## 2016-02-03 MED ORDER — SODIUM CHLORIDE 0.9 % IV SOLN: 250.0000 mL | INTRAVENOUS | Status: DC | PRN

## 2016-02-03 MED ORDER — ASPIRIN EC 81 MG PO TBEC
81.0000 mg | DELAYED_RELEASE_TABLET | Freq: Every day | ORAL | Status: DC
  Administered 2016-02-04: 81 mg via ORAL
  Filled 2016-02-03: qty 1

## 2016-02-03 MED ORDER — ATORVASTATIN CALCIUM 80 MG PO TABS
80.0000 mg | ORAL_TABLET | Freq: Every day | ORAL | Status: DC
  Administered 2016-02-03: 80 mg via ORAL
  Filled 2016-02-03: qty 1

## 2016-02-03 MED ORDER — NITROGLYCERIN 0.4 MG SL SUBL: 0.4000 mg | SUBLINGUAL_TABLET | SUBLINGUAL | Status: DC | PRN

## 2016-02-03 MED ORDER — HEPARIN (PORCINE) IN NACL 100-0.45 UNIT/ML-% IJ SOLN
2200.0000 [IU]/h | INTRAMUSCULAR | Status: DC
  Administered 2016-02-03: 1700 [IU]/h via INTRAVENOUS
  Administered 2016-02-04: 2200 [IU]/h via INTRAVENOUS
  Filled 2016-02-03 (×2): qty 250

## 2016-02-03 MED ORDER — ACETAMINOPHEN 325 MG PO TABS: 650.0000 mg | ORAL_TABLET | ORAL | Status: DC | PRN

## 2016-02-03 MED ORDER — POTASSIUM CHLORIDE CRYS ER 20 MEQ PO TBCR
20.0000 meq | EXTENDED_RELEASE_TABLET | Freq: Two times a day (BID) | ORAL | Status: DC
  Administered 2016-02-03 – 2016-02-04 (×3): 20 meq via ORAL
  Filled 2016-02-03 (×3): qty 1

## 2016-02-03 MED ORDER — ONDANSETRON HCL 4 MG/2ML IJ SOLN: 4.0000 mg | Freq: Four times a day (QID) | INTRAMUSCULAR | Status: DC | PRN

## 2016-02-03 MED ORDER — LISINOPRIL 5 MG PO TABS
5.0000 mg | ORAL_TABLET | Freq: Every day | ORAL | Status: DC
  Administered 2016-02-03 – 2016-02-04 (×2): 5 mg via ORAL
  Filled 2016-02-03 (×2): qty 1

## 2016-02-03 MED ORDER — SODIUM CHLORIDE 0.9 % IV SOLN: INTRAVENOUS | Status: DC

## 2016-02-03 MED ORDER — SODIUM CHLORIDE 0.9 % IV SOLN
INTRAVENOUS | Status: DC
  Administered 2016-02-03: 12:00:00 via INTRAVENOUS

## 2016-02-03 MED ORDER — LORATADINE 10 MG PO TABS
10.0000 mg | ORAL_TABLET | Freq: Every day | ORAL | Status: DC
  Administered 2016-02-04: 10 mg via ORAL
  Filled 2016-02-03: qty 1

## 2016-02-03 NOTE — ED Notes (Signed)
Pt given ginger ale and crackers, approved by MD.

## 2016-02-03 NOTE — Progress Notes (Signed)
ANTICOAGULATION CONSULT NOTE - Initial Consult  Pharmacy Consult for heparin  Indication: NSTEMI  No Known Allergies  Patient Measurements: IBW 86.8 DW 115 kg  TBW 156 kg  Heparin Dosing Weight: 123 kg  Vital Signs: Temp: 97.8 F (36.6 C) (01/14 0830) Temp Source: Oral (01/14 0830) BP: 121/93 (01/14 1130) Pulse Rate: 92 (01/14 1130)  Labs:  Recent Labs  02/03/16 0840  HGB 16.1  HCT 47.2  PLT 241  CREATININE 0.68    CrCl cannot be calculated (Unknown ideal weight.).   Medical History: Past Medical History:  Diagnosis Date  . Arthritis    hands   . COPD (chronic obstructive pulmonary disease) ([place])   . Prostate cancer [facility])    s/p prostatectomy  . Tobacco use   . Umbilical hernia   . Urinary frequency     Assessment: 66 yo with CP.  Pharmacy consulted for heparin for NSTEMI. Troponin 10.33. ECG without changes. MI caused by acute CHF.   Plan for cardiac cath tomorrow.  Ht 6'4 in, wt 344 pounds per pt report.   Goal of Therapy:  Heparin level 0.3-0.7 units/ml Monitor platelets by anticoagulation protocol: Yes   Plan:  Give 4000 units bolus x 1 Start heparin infusion at 1700 units/hr Check anti-Xa level in 6 hours and daily while on heparin Continue to monitor H&H and platelets  Eudelia Bunch, Pharm.D. [id number] 02/03/2016 1:18 PM

## 2016-02-03 NOTE — ED Triage Notes (Signed)
Pt reports having acid reflux on Tuesday, having increase in sob since then. Denies swelling to legs. Denies recent cough. No relief with inhalers at home. ekg done at triage.

## 2016-02-03 NOTE — H&P (Addendum)
History and Physical   Patient ID: John Griffin, MRN: [id number], DOB: 10-Dec-1950   Date of Encounter: 02/03/2016, 11:17 AM  Primary Care Provider: Stephens Shire, MD Cardiologist: New Electrophysiologist:  n/a Pulmonologist: Dr. Corrie Dandy  Chief Complaint:  Chest Pain  History of Present Illness: John Griffin is a 66 y.o. male with a hx of COPD, OSA, prostate CA s/p prostatectomy.  He was in his USOH until 01/29/16.  He developed substernal burning while eating lunch that was quite severe.  He went to his PCP and was thought to have acid reflux.  He notes assoc bilateral arm pain and shortness of breath and his breathing has continued to get worse.  He notes dyspnea on exertion with minimal exertion.  He has not been able to sleep well due to symptoms of orthopnea.  He sleeps with CPAP.  He denies syncope.  He notes increasing LE edema and increasing weight.  His chest pain has gradually improved over the last few days. He denies syncope.  He decided to come to the ED today given worsening shortness of breath.  His initial Troponin is elevated and Cardiology is asked to evaluate.   Prior cardiac studies:  Echo 03/01/15 - Left ventricle: The cavity size was normal. Wall thickness was   increased in a pattern of mild LVH. Systolic function was normal.   The estimated ejection fraction was in the range of 55% to 60%.   Wall motion was normal; there were no regional wall motion   abnormalities. Doppler parameters are consistent with abnormal   left ventricular relaxation (grade 1 diastolic dysfunction). - Left atrium: The atrium was mildly dilated. Impressions: - No obvious vegetation identified but cannot be ruled out on the basis of this examination.  Past Medical History:  Diagnosis Date  . Arthritis    hands   . COPD (chronic obstructive pulmonary disease) ([place])   . HTN (hypertension)   . Prostate cancer ([place])   . Tobacco use   . Umbilical hernia   . Urinary frequency      Past Surgical History:  Procedure Laterality Date  . bone graft right ankle   1972  . LYMPHADENECTOMY Bilateral 08/17/2014   Procedure: LYMPHADENECTOMY;  Surgeon: Raynelle Bring, MD;  Location: [facility];  Service: Urology;  Laterality: Bilateral;  . right shoulder surgery   1992   . ROBOT ASSISTED LAPAROSCOPIC RADICAL PROSTATECTOMY N/A 08/17/2014   Procedure: ROBOTIC ASSISTED LAPAROSCOPIC RADICAL PROSTATECTOMY LEVEL 3  AND UMBILICAL HERNIA REPAIR;  Surgeon: Raynelle Bring, MD;  Location: [facility];  Service: Urology;  Laterality: N/A;     Prior to Admission medications   Medication Sig Start Date End Date Taking? Authorizing Provider  acetaminophen (TYLENOL) 500 MG tablet Take 1,000 mg by mouth every 6 (six) hours as needed for mild pain. Reported on 05/09/2015   Yes Historical Provider, MD  albuterol (PROVENTIL HFA;VENTOLIN HFA) 108 (90 Base) MCG/ACT inhaler Inhale 2 puffs into the lungs every 4 (four) hours as needed for wheezing or shortness of breath. 03/02/15  Yes Jonetta Osgood, MD  aspirin EC 325 MG tablet Take 975 mg by mouth every morning.    Yes Historical Provider, MD  budesonide-formoterol (SYMBICORT) 160-4.5 MCG/ACT inhaler Inhale 2 puffs into the lungs 2 (two) times daily. 05/09/15  Yes Jose Shirl Harris, MD  calcium carbonate (TUMS EX) 750 MG chewable tablet Chew 2 tablets by mouth as needed for heartburn.   Yes Historical Provider, MD  cetirizine (ZYRTEC) 10 MG  tablet Take 10 mg by mouth daily. Reported on 03/14/2015   Yes Historical Provider, MD  Chlorpheniramine-Pseudoeph 4-60 MG TABS Take 1 tablet by mouth every 6 (six) hours.   Yes Historical Provider, MD  fluticasone (FLONASE) 50 MCG/ACT nasal spray Place 1 spray into both nostrils daily.    Yes Historical Provider, MD  ibuprofen (ADVIL,MOTRIN) 200 MG tablet Take 800 mg by mouth every 6 (six) hours as needed for moderate pain.    Yes Historical Provider, MD  omeprazole (PRILOSEC) 20 MG capsule Take 40 mg by mouth daily.    Yes Historical Provider, MD     Allergies: No Known Allergies  Social History:  The patient  reports that he has been smoking Cigarettes.  He has a 40.00 pack-year smoking history. He has never used smokeless tobacco. He reports that he drinks alcohol. He reports that he does not use drugs.   Family History:  The patient's family history includes CAD in his father; Dementia in his mother; Heart attack (age of onset: 75) in his father.   ROS:  Please see the history of present illness.     All other systems reviewed and negative.   Vital Signs: Blood pressure 126/84, pulse 88, temperature 97.8 F (36.6 C), temperature source Oral, resp. rate 25, SpO2 94 %.  PHYSICAL EXAM: General:  Well nourished, well developed with increased work of breathing. HEENT: normal Lymph: no adenopathy Neck: I cannot appreciate JVD Endocrine:  No thryomegaly Vascular: DP/PT 2+ bilat; no FA bruits noted bilaterally  Cardiac:  normal S1, S2; RRR; no murmur  Lungs:  Decreased breath sounds bilaterally; faint rales at the bases bilaterally Abd: distended, mild diffuse tenderness, no obvious hepatomegaly  Ext: 1+ bilateral edema Musculoskeletal:  No deformities  Skin: warm and dry  Neuro:  CNs 2-12 intact, no focal abnormalities noted Psych:  Normal affect   EKG:  NSR, HR 96, LAD, inc RBBB, NSSTTW changes, QTc 462 ms  Labs:   Lab Results  Component Value Date   WBC 12.7 (H) 02/03/2016   HGB 16.1 02/03/2016   HCT 47.2 02/03/2016   MCV 94.0 02/03/2016   PLT 241 02/03/2016     Recent Labs Lab 02/03/16 0840  NA 138  K 3.5  CL 100*  CO2 25  BUN 9  CREATININE 0.68  CALCIUM 9.4  GLUCOSE 117*    Recent Labs Lab 02/03/16 0849 02/03/16 0954  TROPIPOC 10.33* 9.23*    No results for input(s): CKTOTAL, CKMB, TROPONINI in the last 72 hours.  No results found for: CHOL, HDL, LDLCALC, TRIG  No results found for: DDIMER  Radiology/Studies:   Dg Chest 2 View  Result Date:  02/03/2016 CLINICAL DATA:  Acid reflux Tuesday, increased shortness of breath and chest pain radiating to neck and shoulders since, history COPD, hypertension, prostate cancer, former smoker EXAM: CHEST  2 VIEW COMPARISON:  02/25/2015 FINDINGS: Enlargement of cardiac silhouette with pulmonary vascular congestion. Mediastinal contours normal. Perihilar to basilar pulmonary infiltrates bilaterally question pulmonary edema though infection not excluded. No pleural effusion or pneumothorax. Bones demineralized with RIGHT AC joint degenerative changes noted. IMPRESSION: Enlargement of cardiac silhouette with pulmonary vascular congestion. BILATERAL pulmonary infiltrates favor pulmonary edema over infection. Electronically Signed   By: Lavonia Dana M.D.   On: 02/03/2016 10:05     ASSESSMENT AND PLAN:   1. NSTEMI - 66 yo male, active smoker, with hx of COPD and FHx of CAD presents with persistent chest pain for several days with associated dyspnea  that is progressively getting worse.  His Troponin is 10.33.  His ECG is without acute changes.  MI is c/b acute CHF.    -  Admit to CCU  -  O2 2 LPM  -  Continue ASA 81 mg QD  -  IV Heparin  -  IV NTG  -  Lipitor 80 QD  -  Bisoprolol 2.5 QD; Lisinopril 5 QD  -  Get Echo  -  Continue to cycle Troponin levels  -  Plan cardiac cath tomorrow.  D/w patient.    -  If pain continues or worsens, may need urgent cath today.  2. Acute CHF - Give Lasix 40 IV x 1.  Check Echo. Start Lasix 40 mg IV bid.  K+ 20 mEq bid. BMET in AM.  3. COPD - Continue current pulmonary medications. He plans to quit smoking.  4. OSA - Continue CPAP.   5. Tobacco abuse - Smoking cessation advised.    Signed,  Richardson Dopp, PA-C 02/03/2016 11:17 AM  Pager # [phone]   Patient seen with PA, agree with the above note.   1. NSTEMI: Suspect out of hospital MI, likely on Tuesday when pain was worst.  Has had persistent pain since then, but very minimal now.  Troponin elevated, ECG with  early transition in precordial leads which is different from prior ECG, ?LCx disease. - Plan for cardiac cath tomorrow.  Risks/benefits discussed with patient and he agrees to proceed.  - Heparin gtt, ASA, atorvastatin 80.   - Will use NTG gtt given residual chest pain.  2. Acute CHF: Patient is volume overloaded on exam with pulmonary edema on CXR.  Increased dyspnea over last few days, this is what brought him to ER.  I am concerned about acute systolic CHF in setting of NSTEMI with ischemic cardiomyopathy.  - Will arrange echo.  - Lasix 40 mg IV bid, 1st dose now.  He currently is unable to lie flat.  - Start lisinopril 5 mg daily.  - Will use low dose bisoprolol (beta-1 specific given COPD).  3. COPD: Needs to stop smoking.  4. OSA: Continue CPAP.   Loralie Champagne 02/03/2016 12:20 PM

## 2016-02-03 NOTE — ED Provider Notes (Addendum)
[facility] DEPT Provider Note   CSN: [id number] Arrival date & time: 02/03/16  [id number]     History   Chief Complaint Chief Complaint  Patient presents with  . Shortness of Breath    HPI John Griffin is a 66 y.o. male.  Patient Tuesday Wednesday with a burning sensation in the chest felt like it was indigestion. Did radiate to between his shoulder blades up to the back of the neck and also to both shoulders. Patient seen by primary care doctor felt that perhaps it was related to his COPD or perhaps indigestion. Symptoms have improved but patient is had persistent feeling of shortness of breath. No leg swelling. No nausea no vomiting. No prior cardiac history. Patient has a history of COPD and hypertension and tobacco use.      Past Medical History:  Diagnosis Date  . Arthritis    hands   . COPD (chronic obstructive pulmonary disease) ([place])   . HTN (hypertension)   . Prostate cancer ([place])   . Tobacco use   . Umbilical hernia   . Urinary frequency     Patient Active Problem List   Diagnosis Date Noted  . OSA (obstructive sleep apnea) 06/27/2015  . COPD (chronic obstructive pulmonary disease) ([place]) 03/14/2015  . Hypersomnia 03/14/2015  . Exertional dyspnea 03/14/2015  . Obesity 03/14/2015  . Acute respiratory failure with hypoxia ([place]) 02/24/2015  . SIRS (systemic inflammatory response syndrome) ([place]) 02/24/2015  . Leukocytosis 02/24/2015  . Tachycardia 02/24/2015  . Acute bronchitis 02/24/2015  . Sepsis ([place]) 02/24/2015  . Tobacco use   . HTN (hypertension)   . Prostate cancer ([place]) 08/17/2014    Past Surgical History:  Procedure Laterality Date  . bone graft right ankle   1972  . LYMPHADENECTOMY Bilateral 08/17/2014   Procedure: LYMPHADENECTOMY;  Surgeon: Raynelle Bring, MD;  Location: [facility];  Service: Urology;  Laterality: Bilateral;  . right shoulder surgery   1992   . ROBOT ASSISTED LAPAROSCOPIC RADICAL PROSTATECTOMY N/A 08/17/2014   Procedure: ROBOTIC  ASSISTED LAPAROSCOPIC RADICAL PROSTATECTOMY LEVEL 3  AND UMBILICAL HERNIA REPAIR;  Surgeon: Raynelle Bring, MD;  Location: [facility];  Service: Urology;  Laterality: N/A;       Home Medications    Prior to Admission medications   Medication Sig Start Date End Date Taking? Authorizing Provider  acetaminophen (TYLENOL) 500 MG tablet Take 1,000 mg by mouth every 6 (six) hours as needed for mild pain. Reported on 05/09/2015   Yes Historical Provider, MD  albuterol (PROVENTIL HFA;VENTOLIN HFA) 108 (90 Base) MCG/ACT inhaler Inhale 2 puffs into the lungs every 4 (four) hours as needed for wheezing or shortness of breath. 03/02/15  Yes Jonetta Osgood, MD  aspirin EC 325 MG tablet Take 975 mg by mouth every morning.    Yes Historical Provider, MD  budesonide-formoterol (SYMBICORT) 160-4.5 MCG/ACT inhaler Inhale 2 puffs into the lungs 2 (two) times daily. 05/09/15  Yes Jose Shirl Harris, MD  calcium carbonate (TUMS EX) 750 MG chewable tablet Chew 2 tablets by mouth as needed for heartburn.   Yes Historical Provider, MD  cetirizine (ZYRTEC) 10 MG tablet Take 10 mg by mouth daily. Reported on 03/14/2015   Yes Historical Provider, MD  Chlorpheniramine-Pseudoeph 4-60 MG TABS Take 1 tablet by mouth every 6 (six) hours.   Yes Historical Provider, MD  fluticasone (FLONASE) 50 MCG/ACT nasal spray Place 1 spray into both nostrils daily.    Yes Historical Provider, MD  ibuprofen (ADVIL,MOTRIN) 200 MG tablet  Take 800 mg by mouth every 6 (six) hours as needed for moderate pain.    Yes Historical Provider, MD  omeprazole (PRILOSEC) 20 MG capsule Take 40 mg by mouth daily.   Yes Historical Provider, MD    Family History History reviewed. No pertinent family history.  Social History Social History  Substance Use Topics  . Smoking status: Former Smoker    Packs/day: 1.00    Years: 40.00    Types: Cigarettes    Quit date: 02/23/2015  . Smokeless tobacco: Never Used  . Alcohol use 0.0 oz/week     Comment:  occasional      Allergies   Patient has no known allergies.   Review of Systems Review of Systems  Constitutional: Negative for fever.  HENT: Negative for congestion.   Eyes: Negative for visual disturbance.  Respiratory: Positive for shortness of breath. Negative for wheezing.   Cardiovascular: Positive for chest pain. Negative for leg swelling.  Gastrointestinal: Negative for abdominal pain, nausea and vomiting.  Genitourinary: Negative for dysuria.  Musculoskeletal: Positive for back pain.  Skin: Negative for rash.  Neurological: Negative for syncope and headaches.  Hematological: Does not bruise/bleed easily.     Physical Exam Updated Vital Signs BP 122/80   Pulse 99   Temp 97.8 F (36.6 C) (Oral)   Resp 25   SpO2 96%   Physical Exam  Constitutional: He is oriented to person, place, and time. He appears well-developed and well-nourished. No distress.  HENT:  Head: Normocephalic and atraumatic.  Mouth/Throat: Oropharynx is clear and moist.  Eyes: Conjunctivae and EOM are normal. Pupils are equal, round, and reactive to light.  Neck: Normal range of motion. Neck supple.  Cardiovascular: Normal rate, regular rhythm and normal heart sounds.   Pulmonary/Chest: Effort normal and breath sounds normal. No respiratory distress. He has no wheezes.  Abdominal: Soft. Bowel sounds are normal. There is no tenderness.  Musculoskeletal: Normal range of motion.  Neurological: He is alert and oriented to person, place, and time. No cranial nerve deficit or sensory deficit. He exhibits normal muscle tone. Coordination normal.  Skin: Skin is warm.  Nursing note and vitals reviewed.    ED Treatments / Results  Labs (all labs ordered are listed, but only abnormal results are displayed) Labs Reviewed  BASIC METABOLIC PANEL - Abnormal; Notable for the following:       Result Value   Chloride 100 (*)    Glucose, Bld 117 (*)    All other components within normal limits  CBC -  Abnormal; Notable for the following:    WBC 12.7 (*)    All other components within normal limits  I-STAT TROPOININ, ED - Abnormal; Notable for the following:    Troponin i, poc 10.33 (*)    All other components within normal limits  I-STAT TROPOININ, ED - Abnormal; Notable for the following:    Troponin i, poc 9.23 (*)    All other components within normal limits  BRAIN NATRIURETIC PEPTIDE   Results for orders placed or performed during the hospital encounter of [id number]  Basic metabolic panel  Result Value Ref Range   Sodium 138 135 - 145 mmol/L   Potassium 3.5 3.5 - 5.1 mmol/L   Chloride 100 (L) 101 - 111 mmol/L   CO2 25 22 - 32 mmol/L   Glucose, Bld 117 (H) 65 - 99 mg/dL   BUN 9 6 - 20 mg/dL   Creatinine, Ser 0.68 0.61 - 1.24 mg/dL   Calcium  9.4 8.9 - 10.3 mg/dL   GFR calc non Af Amer >60 >60 mL/min   GFR calc Af Amer >60 >60 mL/min   Anion gap 13 5 - 15  CBC  Result Value Ref Range   WBC 12.7 (H) 4.0 - 10.5 K/uL   RBC 5.02 4.22 - 5.81 MIL/uL   Hemoglobin 16.1 13.0 - 17.0 g/dL   HCT 47.2 39.0 - 52.0 %   MCV 94.0 78.0 - 100.0 fL   MCH 32.1 26.0 - 34.0 pg   MCHC 34.1 30.0 - 36.0 g/dL   RDW 13.7 11.5 - 15.5 %   Platelets 241 150 - 400 K/uL  I-stat troponin, ED  Result Value Ref Range   Troponin i, poc 10.33 (HH) 0.00 - 0.08 ng/mL   Comment NOTIFIED PHYSICIAN    Comment 3          I-stat troponin, ED  Result Value Ref Range   Troponin i, poc 9.23 (HH) 0.00 - 0.08 ng/mL   Comment NOTIFIED PHYSICIAN    Comment 3             EKG  EKG Interpretation  Date/Time:  Sunday February 03 2016 08:22:17 EST Ventricular Rate:  96 PR Interval:  166 QRS Duration: 114 QT Interval:  366 QTC Calculation: 462 R Axis:   -87 Text Interpretation:  Sinus rhythm with occasional Premature ventricular complexes Left axis deviation Right bundle branch block Abnormal ECG Confirmed by Jaekwon Mcclune  MD, Michalle Rademaker ([id number]) on 02/03/2016 9:28:20 AM       Radiology Dg Chest 2 View  Result  Date: 02/03/2016 CLINICAL DATA:  Acid reflux Tuesday, increased shortness of breath and chest pain radiating to neck and shoulders since, history COPD, hypertension, prostate cancer, former smoker EXAM: CHEST  2 VIEW COMPARISON:  02/25/2015 FINDINGS: Enlargement of cardiac silhouette with pulmonary vascular congestion. Mediastinal contours normal. Perihilar to basilar pulmonary infiltrates bilaterally question pulmonary edema though infection not excluded. No pleural effusion or pneumothorax. Bones demineralized with RIGHT AC joint degenerative changes noted. IMPRESSION: Enlargement of cardiac silhouette with pulmonary vascular congestion. BILATERAL pulmonary infiltrates favor pulmonary edema over infection. Electronically Signed   By: Lavonia Dana M.D.   On: 02/03/2016 10:05    Procedures Procedures (including critical care time)  Medications Ordered in ED Medications  0.9 %  sodium chloride infusion (not administered)     Initial Impression / Assessment and Plan / ED Course  I have reviewed the triage vital signs and the nursing notes.  Pertinent labs & imaging results that were available during my care of the patient were reviewed by me and considered in my medical decision making (see chart for details).  Clinical Course    Patient with markedly elevated troponin patient with a lot of chest discomfort on Tuesday that lasted for about 2-3 days. That is since resolved. Patient still little bit shortness of breath. Repeat troponin showed slight improvement there only an hour apart one from 10-9. Chest x-ray shows some concern for slight pulmonary edema. Patient's room air sats are normal at rest. Without any oxygen.  Suspect patient had a missed heart attack on Tuesday or Wednesday. Patient's EKG does not seem to have any evidence of Q waves or any significant abnormalities. Patient's blood pressure is normal. Patient takes 2 adult aspirin a day had those at 2 this morning. This is normal for  him.  Patient has a history of COPD but does not have any wheezing. Patient states it doesn't feel like his COPD.  Discussed with cardiology they will see.  Final Clinical Impressions(s) / ED Diagnoses   Final diagnoses:  Non-STEMI (non-ST elevated myocardial infarction) [facility])    New Prescriptions New Prescriptions   No medications on file     Fredia Sorrow, MD 02/03/16 1103  After speaking with cardiology. Patient appears to be a little bit more short of breath no wheezing. Will going give some IV Lasix patient not normally on Lasix. Will put him on a couple liters of oxygen he knows oxygen sats are normal.    Fredia Sorrow, MD 02/03/16 1113

## 2016-02-03 NOTE — Progress Notes (Signed)
Patient placed on CPAP using FFM for HS.  15/5 used as per patient's home settings.  Patient is familiar with equipment and procedure and tolerates well.  3L oxygen bleed in with sats 94%.

## 2016-02-03 NOTE — Progress Notes (Signed)
CRITICAL VALUE ALERT  Critical value received:  Troponin 5.55  Date of notification:  02/03/16  Time of notification: 1615  Critical value read back:Yes.    Nurse who received alert: Newman Nickels RN  MD aware

## 2016-02-03 NOTE — ED Notes (Signed)
Verified with PA Kathlen Mody that he would like IV nitro started at this time since pt c/o "rib soreness" to this RN. PA advised to go ahead and start IV nitro at 15mcg/min

## 2016-02-04 ENCOUNTER — Encounter (HOSPITAL_COMMUNITY): Payer: Self-pay | Admitting: Cardiovascular Disease

## 2016-02-04 ENCOUNTER — Encounter (HOSPITAL_COMMUNITY)
Admission: EM | Disposition: A | Discharge status: Home / Self Care | Payer: Self-pay | Source: Home / Self Care | Attending: Cardiology

## 2016-02-04 ENCOUNTER — Inpatient Hospital Stay (HOSPITAL_COMMUNITY): Payer: Medicare Other

## 2016-02-04 DIAGNOSIS — Z720 Tobacco use: Secondary | ICD-10-CM

## 2016-02-04 DIAGNOSIS — I2510 Atherosclerotic heart disease of native coronary artery without angina pectoris: Secondary | ICD-10-CM

## 2016-02-04 DIAGNOSIS — I5021 Acute systolic (congestive) heart failure: Secondary | ICD-10-CM

## 2016-02-04 HISTORY — PX: CARDIAC CATHETERIZATION: SHX172

## 2016-02-04 LAB — BASIC METABOLIC PANEL
ANION GAP: 10 (ref 5–15)
BUN: 12 mg/dL (ref 6–20)
CALCIUM: 9 mg/dL (ref 8.9–10.3)
CO2: 28 mmol/L (ref 22–32)
Chloride: 100 mmol/L — ABNORMAL LOW (ref 101–111)
Creatinine, Ser: 0.76 mg/dL (ref 0.61–1.24)
Glucose, Bld: 127 mg/dL — ABNORMAL HIGH (ref 65–99)
Potassium: 3.5 mmol/L (ref 3.5–5.1)
SODIUM: 138 mmol/L (ref 135–145)

## 2016-02-04 LAB — TROPONIN I
TROPONIN I: 6.4 ng/mL — AB (ref <0.03)
Troponin I: 6.43 ng/mL (ref <0.03)

## 2016-02-04 LAB — CBC
HCT: 45 % (ref 39.0–52.0)
HEMOGLOBIN: 15.1 g/dL (ref 13.0–17.0)
MCH: 31.6 pg (ref 26.0–34.0)
MCHC: 33.6 g/dL (ref 30.0–36.0)
MCV: 94.1 fL (ref 78.0–100.0)
Platelets: 242 10*3/uL (ref 150–400)
RBC: 4.78 MIL/uL (ref 4.22–5.81)
RDW: 13.7 % (ref 11.5–15.5)
WBC: 12.6 10*3/uL — ABNORMAL HIGH (ref 4.0–10.5)

## 2016-02-04 LAB — HEPARIN LEVEL (UNFRACTIONATED)
Heparin Unfractionated: <0.1 IU/mL — ABNORMAL LOW (ref 0.30–0.70)
Heparin Unfractionated: 0.36 IU/mL (ref 0.30–0.70)

## 2016-02-04 LAB — LIPID PANEL
CHOLESTEROL: 150 mg/dL (ref 0–200)
HDL: 23 mg/dL — ABNORMAL LOW (ref >40)
LDL Cholesterol: 106 mg/dL — ABNORMAL HIGH (ref 0–99)
TRIGLYCERIDES: 106 mg/dL (ref <150)
Total CHOL/HDL Ratio: 6.5 RATIO
VLDL: 21 mg/dL (ref 0–40)

## 2016-02-04 LAB — HEMOGLOBIN A1C
Hgb A1c MFr Bld: 6 % — ABNORMAL HIGH (ref 4.8–5.6)
MEAN PLASMA GLUCOSE: 126 mg/dL

## 2016-02-04 SURGERY — LEFT HEART CATH AND CORONARY ANGIOGRAPHY
Anesthesia: LOCAL

## 2016-02-04 MED ORDER — HEPARIN (PORCINE) IN NACL 2-0.9 UNIT/ML-% IJ SOLN
INTRAMUSCULAR | Status: DC | PRN
  Administered 2016-02-04: 1000 mL

## 2016-02-04 MED ORDER — CALCIUM CARBONATE ANTACID 500 MG PO CHEW: 3.0000 | CHEWABLE_TABLET | ORAL | Status: DC | PRN

## 2016-02-04 MED ORDER — BISOPROLOL FUMARATE 5 MG PO TABS: 2.5000 mg | ORAL_TABLET | Freq: Every day | ORAL | Status: DC

## 2016-02-04 MED ORDER — ONDANSETRON HCL 4 MG/2ML IJ SOLN: 4.0000 mg | Freq: Four times a day (QID) | INTRAMUSCULAR | Status: DC | PRN

## 2016-02-04 MED ORDER — SODIUM CHLORIDE 0.9 % IV SOLN: 250.0000 mL | INTRAVENOUS | Status: DC | PRN

## 2016-02-04 MED ORDER — ISOSORBIDE MONONITRATE ER 30 MG PO TB24
30.0000 mg | ORAL_TABLET | Freq: Every day | ORAL | Status: DC
  Administered 2016-02-04 – 2016-02-06 (×3): 30 mg via ORAL
  Filled 2016-02-04 (×3): qty 1

## 2016-02-04 MED ORDER — NITROGLYCERIN 1 MG/10 ML FOR IR/CATH LAB
INTRA_ARTERIAL | Status: AC
  Filled 2016-02-04: qty 10

## 2016-02-04 MED ORDER — ATORVASTATIN CALCIUM 80 MG PO TABS
80.0000 mg | ORAL_TABLET | Freq: Every day | ORAL | Status: DC
  Administered 2016-02-04 – 2016-02-06 (×3): 80 mg via ORAL
  Filled 2016-02-04 (×3): qty 1

## 2016-02-04 MED ORDER — ACETAMINOPHEN 325 MG PO TABS: 650.0000 mg | ORAL_TABLET | ORAL | Status: DC | PRN

## 2016-02-04 MED ORDER — SODIUM CHLORIDE 0.9 % IV SOLN
INTRAVENOUS | Status: AC
  Administered 2016-02-04: 15:00:00 via INTRAVENOUS

## 2016-02-04 MED ORDER — MORPHINE SULFATE (PF) 2 MG/ML IV SOLN: 2.0000 mg | INTRAVENOUS | Status: DC | PRN

## 2016-02-04 MED ORDER — POTASSIUM CHLORIDE CRYS ER 20 MEQ PO TBCR
20.0000 meq | EXTENDED_RELEASE_TABLET | Freq: Two times a day (BID) | ORAL | Status: DC
  Administered 2016-02-04 – 2016-02-06 (×4): 20 meq via ORAL
  Filled 2016-02-04 (×4): qty 1

## 2016-02-04 MED ORDER — LIDOCAINE HCL (PF) 1 % IJ SOLN
INTRAMUSCULAR | Status: AC
Start: 2016-02-04 — End: 2016-02-04
  Filled 2016-02-04: qty 30

## 2016-02-04 MED ORDER — FUROSEMIDE 10 MG/ML IJ SOLN
80.0000 mg | Freq: Once | INTRAMUSCULAR | Status: AC
  Administered 2016-02-04: 80 mg via INTRAVENOUS

## 2016-02-04 MED ORDER — IOPAMIDOL (ISOVUE-370) INJECTION 76%
INTRAVENOUS | Status: AC
  Filled 2016-02-04: qty 100

## 2016-02-04 MED ORDER — FUROSEMIDE 10 MG/ML IJ SOLN
INTRAMUSCULAR | Status: AC
Start: 2016-02-04 — End: 2016-02-04
  Filled 2016-02-04: qty 4

## 2016-02-04 MED ORDER — HEPARIN SODIUM (PORCINE) 1000 UNIT/ML IJ SOLN
INTRAMUSCULAR | Status: DC | PRN
  Administered 2016-02-04: 7000 [IU] via INTRAVENOUS

## 2016-02-04 MED ORDER — HEPARIN (PORCINE) IN NACL 2-0.9 UNIT/ML-% IJ SOLN
INTRAMUSCULAR | Status: AC
  Filled 2016-02-04: qty 1000

## 2016-02-04 MED ORDER — ASPIRIN 81 MG PO CHEW
81.0000 mg | CHEWABLE_TABLET | Freq: Every day | ORAL | Status: DC
  Administered 2016-02-05 – 2016-02-06 (×2): 81 mg via ORAL
  Filled 2016-02-04 (×2): qty 1

## 2016-02-04 MED ORDER — LORATADINE 10 MG PO TABS
10.0000 mg | ORAL_TABLET | Freq: Every day | ORAL | Status: DC
  Administered 2016-02-05 – 2016-02-06 (×2): 10 mg via ORAL
  Filled 2016-02-04 (×2): qty 1

## 2016-02-04 MED ORDER — VERAPAMIL HCL 2.5 MG/ML IV SOLN
INTRAVENOUS | Status: AC
Start: 2016-02-04 — End: 2016-02-04
  Filled 2016-02-04: qty 2

## 2016-02-04 MED ORDER — FUROSEMIDE 10 MG/ML IJ SOLN
INTRAMUSCULAR | Status: AC
  Filled 2016-02-04: qty 4

## 2016-02-04 MED ORDER — SODIUM CHLORIDE 0.9% FLUSH
3.0000 mL | Freq: Two times a day (BID) | INTRAVENOUS | Status: DC
  Administered 2016-02-04 – 2016-02-06 (×4): 3 mL via INTRAVENOUS

## 2016-02-04 MED ORDER — SODIUM CHLORIDE 0.9% FLUSH: 3.0000 mL | INTRAVENOUS | Status: DC | PRN

## 2016-02-04 MED ORDER — IOPAMIDOL (ISOVUE-370) INJECTION 76%
INTRAVENOUS | Status: DC | PRN
  Administered 2016-02-04: 110 mL via INTRA_ARTERIAL

## 2016-02-04 MED ORDER — PANTOPRAZOLE SODIUM 40 MG PO TBEC
40.0000 mg | DELAYED_RELEASE_TABLET | Freq: Every day | ORAL | Status: DC
  Administered 2016-02-05 – 2016-02-06 (×2): 40 mg via ORAL
  Filled 2016-02-04 (×2): qty 1

## 2016-02-04 MED ORDER — LIDOCAINE HCL (PF) 1 % IJ SOLN
INTRAMUSCULAR | Status: DC | PRN
  Administered 2016-02-04: 2 mL

## 2016-02-04 MED ORDER — NITROGLYCERIN 0.4 MG SL SUBL: 0.4000 mg | SUBLINGUAL_TABLET | SUBLINGUAL | Status: DC | PRN

## 2016-02-04 MED ORDER — FUROSEMIDE 10 MG/ML IJ SOLN
INTRAMUSCULAR | Status: DC | PRN
  Administered 2016-02-04: 80 mg via INTRAVENOUS

## 2016-02-04 MED ORDER — HEPARIN BOLUS VIA INFUSION
4000.0000 [IU] | Freq: Once | INTRAVENOUS | Status: AC
  Administered 2016-02-04: 4000 [IU] via INTRAVENOUS
  Filled 2016-02-04: qty 4000

## 2016-02-04 MED ORDER — IOPAMIDOL (ISOVUE-370) INJECTION 76%
INTRAVENOUS | Status: AC
  Filled 2016-02-04: qty 50

## 2016-02-04 MED ORDER — VERAPAMIL HCL 2.5 MG/ML IV SOLN
INTRA_ARTERIAL | Status: DC | PRN
  Administered 2016-02-04: 15 mL via INTRA_ARTERIAL

## 2016-02-04 MED ORDER — LISINOPRIL 5 MG PO TABS
5.0000 mg | ORAL_TABLET | Freq: Every day | ORAL | Status: DC
  Administered 2016-02-05 – 2016-02-06 (×2): 5 mg via ORAL
  Filled 2016-02-04 (×2): qty 1

## 2016-02-04 MED ORDER — MOMETASONE FURO-FORMOTEROL FUM 200-5 MCG/ACT IN AERO
2.0000 | INHALATION_SPRAY | Freq: Two times a day (BID) | RESPIRATORY_TRACT | Status: DC
  Administered 2016-02-04 – 2016-02-06 (×4): 2 via RESPIRATORY_TRACT
  Filled 2016-02-04: qty 8.8

## 2016-02-04 MED ORDER — FLUTICASONE PROPIONATE 50 MCG/ACT NA SUSP
1.0000 | Freq: Every day | NASAL | Status: DC
  Administered 2016-02-05 – 2016-02-06 (×2): 1 via NASAL

## 2016-02-04 SURGICAL SUPPLY — 13 items
CATH EXPO 5FR ANG PIGTAIL 145 (CATHETERS) ×2 IMPLANT
CATH OPTITORQUE TIG 4.0 5F (CATHETERS) ×2 IMPLANT
DEVICE RAD COMP TR BAND LRG (VASCULAR PRODUCTS) ×2 IMPLANT
GLIDESHEATH SLEND A-KIT 6F 22G (SHEATH) ×2 IMPLANT
GUIDEWIRE INQWIRE 1.5J.035X260 (WIRE) ×1 IMPLANT
HOVERMATT SINGLE USE (MISCELLANEOUS) ×2 IMPLANT
INQWIRE 1.5J .035X260CM (WIRE) ×2
KIT HEART LEFT (KITS) ×2 IMPLANT
PACK CARDIAC CATHETERIZATION (CUSTOM PROCEDURE TRAY) ×2 IMPLANT
SYR MEDRAD MARK V 150ML (SYRINGE) ×2 IMPLANT
TRANSDUCER W/STOPCOCK (MISCELLANEOUS) ×2 IMPLANT
TUBING CIL FLEX 10 FLL-RA (TUBING) ×2 IMPLANT
WIRE HI TORQ VERSACORE-J 145CM (WIRE) ×2 IMPLANT

## 2016-02-04 NOTE — Progress Notes (Signed)
ANTICOAGULATION CONSULT NOTE - Follow Up Consult  Pharmacy Consult for heparin  Indication: NSTEMI  No Known Allergies  Patient Measurements: IBW 86.8 DW 115 kg Height: 6\' 4"  (193 cm) Weight: (!) 330 lb 6.4 oz (149.9 kg) IBW/kg (Calculated) : 86.8TBW 156 kg  Heparin Dosing Weight: 123 kg  Vital Signs: Temp: 97.7 F (36.5 C) (01/15 0756) Temp Source: Oral (01/15 0756) BP: 114/60 (01/15 0800) Pulse Rate: 86 (01/15 0800)  Labs:  Recent Labs  02/03/16 0840 02/03/16 1450 02/03/16 2344 02/04/16 0246 02/04/16 0823  HGB 16.1  --   --  15.1  --   HCT 47.2  --   --  45.0  --   PLT 241  --   --  242  --   LABPROT  --  15.7*  --   --   --   INR  --  1.25  --   --   --   HEPARINUNFRC  --   --  <0.10*  --  0.36  CREATININE 0.68  --   --  0.76  --   TROPONINI  --  5.55* 6.40* 6.43*  --     Estimated Creatinine Clearance: 143.9 mL/min (by C-G formula based on SCr of 0.76 mg/dL).   Medical History: Past Medical History:  Diagnosis Date  . Arthritis    hands   . COPD (chronic obstructive pulmonary disease) ([place])   . Prostate cancer [facility])    s/p prostatectomy  . Tobacco use   . Umbilical hernia   . Urinary frequency     Assessment: 65 yo with CP.  Pharmacy consulted for heparin for NSTEMI. Troponin 10.33>6.4. ECG without changes.   Plan for cardiac cath today.  Started on asa, statin bisoprololl and lisinopril.   Heparin drip 2200 uts/hr HL 0.36 at goal.  CBC stable no bleeding.  Goal of Therapy:  Heparin level 0.3-0.7 units/ml Monitor platelets by anticoagulation protocol: Yes   Plan:  Continue Heparin drip 2200 uts/hr Daily HL, CBC F/u after cath  Bonnita Nasuti Pharm.D. CPP, BCPS Clinical Pharmacist [phone] 02/04/2016 9:07 AM

## 2016-02-04 NOTE — H&P (View-Only) (Signed)
Subjective:  No further CP. SOB improved. On IV hep/NTG. NSTEMI. For cath radially this afternoon  Objective:  Temp:  [97.6 F (36.4 C)-98.5 F (36.9 C)] 97.7 F (36.5 C) (01/15 0756) Pulse Rate:  [76-117] 86 (01/15 0800) Resp:  [13-37] 16 (01/15 0800) BP: (104-145)/(57-93) 114/60 (01/15 0800) SpO2:  [89 %-97 %] 92 % (01/15 0840) Weight:  [330 lb 6.4 oz (149.9 kg)-344 lb (156 kg)] 330 lb 6.4 oz (149.9 kg) (01/15 0600) Weight change:   Intake/Output from previous day: 01/14 0701 - 01/15 0700 In: 738.4 [P.O.:240; I.V.:498.4] Out: 1520 [Urine:1520]  Intake/Output from this shift: Total I/O In: 45.8 [I.V.:45.8] Out: -   Physical Exam: General appearance: alert and no distress Neck: no adenopathy, no carotid bruit, no JVD, supple, symmetrical, trachea midline and thyroid not enlarged, symmetric, no tenderness/mass/nodules Lungs: clear to auscultation bilaterally Heart: regular rate and rhythm, S1, S2 normal, no murmur, click, rub or gallop Extremities: extremities normal, atraumatic, no cyanosis or edema  Lab Results: Results for orders placed or performed during the hospital encounter of 02/03/16 (from the past 48 hour(s))  Basic metabolic panel     Status: Abnormal   Collection Time: 02/03/16  8:40 AM  Result Value Ref Range   Sodium 138 135 - 145 mmol/L   Potassium 3.5 3.5 - 5.1 mmol/L   Chloride 100 (L) 101 - 111 mmol/L   CO2 25 22 - 32 mmol/L   Glucose, Bld 117 (H) 65 - 99 mg/dL   BUN 9 6 - 20 mg/dL   Creatinine, Ser 0.68 0.61 - 1.24 mg/dL   Calcium 9.4 8.9 - 10.3 mg/dL   GFR calc non Af Amer >60 >60 mL/min   GFR calc Af Amer >60 >60 mL/min    Comment: (NOTE) The eGFR has been calculated using the CKD EPI equation. This calculation has not been validated in all clinical situations. eGFR's persistently <60 mL/min signify possible Chronic Kidney Disease.    Anion gap 13 5 - 15  CBC     Status: Abnormal   Collection Time: 02/03/16  8:40 AM  Result Value  Ref Range   WBC 12.7 (H) 4.0 - 10.5 K/uL   RBC 5.02 4.22 - 5.81 MIL/uL   Hemoglobin 16.1 13.0 - 17.0 g/dL   HCT 47.2 39.0 - 52.0 %   MCV 94.0 78.0 - 100.0 fL   MCH 32.1 26.0 - 34.0 pg   MCHC 34.1 30.0 - 36.0 g/dL   RDW 13.7 11.5 - 15.5 %   Platelets 241 150 - 400 K/uL  I-stat troponin, ED     Status: Abnormal   Collection Time: 02/03/16  8:49 AM  Result Value Ref Range   Troponin i, poc 10.33 (HH) 0.00 - 0.08 ng/mL   Comment NOTIFIED PHYSICIAN    Comment 3            Comment: Due to the release kinetics of cTnI, a negative result within the first hours of the onset of symptoms does not rule out myocardial infarction with certainty. If myocardial infarction is still suspected, repeat the test at appropriate intervals.   Brain natriuretic peptide     Status: Abnormal   Collection Time: 02/03/16  8:59 AM  Result Value Ref Range   B Natriuretic Peptide 412.4 (H) 0.0 - 100.0 pg/mL  I-stat troponin, ED     Status: Abnormal   Collection Time: 02/03/16  9:54 AM  Result Value Ref Range   Troponin i, poc 9.23 (HH) 0.00 -  0.08 ng/mL   Comment NOTIFIED PHYSICIAN    Comment 3            Comment: Due to the release kinetics of cTnI, a negative result within the first hours of the onset of symptoms does not rule out myocardial infarction with certainty. If myocardial infarction is still suspected, repeat the test at appropriate intervals.   MRSA PCR Screening     Status: None   Collection Time: 02/03/16  2:34 PM  Result Value Ref Range   MRSA by PCR NEGATIVE NEGATIVE    Comment:        The GeneXpert MRSA Assay (FDA approved for NASAL specimens only), is one component of a comprehensive MRSA colonization surveillance program. It is not intended to diagnose MRSA infection nor to guide or monitor treatment for MRSA infections.   Hepatic function panel     Status: Abnormal   Collection Time: 02/03/16  2:50 PM  Result Value Ref Range   Total Protein 7.6 6.5 - 8.1 g/dL   Albumin  3.1 (L) 3.5 - 5.0 g/dL   AST 63 (H) 15 - 41 U/L   ALT 56 17 - 63 U/L   Alkaline Phosphatase 155 (H) 38 - 126 U/L   Total Bilirubin 1.5 (H) 0.3 - 1.2 mg/dL   Bilirubin, Direct 0.4 0.1 - 0.5 mg/dL   Indirect Bilirubin 1.1 (H) 0.3 - 0.9 mg/dL  TSH     Status: None   Collection Time: 02/03/16  2:50 PM  Result Value Ref Range   TSH 3.131 0.350 - 4.500 uIU/mL    Comment: Performed by a 3rd Generation assay with a functional sensitivity of <=0.01 uIU/mL.  Troponin I     Status: Abnormal   Collection Time: 02/03/16  2:50 PM  Result Value Ref Range   Troponin I 5.55 (HH) <0.03 ng/mL    Comment: CRITICAL RESULT CALLED TO, READ BACK BY AND VERIFIED WITH: S.MERLINI,RN 02/03/16 @1614  BY V.WILKINS   Protime-INR     Status: Abnormal   Collection Time: 02/03/16  2:50 PM  Result Value Ref Range   Prothrombin Time 15.7 (H) 11.4 - 15.2 seconds   INR 1.25   Heparin level (unfractionated)     Status: Abnormal   Collection Time: 02/03/16 11:44 PM  Result Value Ref Range   Heparin Unfractionated <0.10 (L) 0.30 - 0.70 IU/mL    Comment:        IF HEPARIN RESULTS ARE BELOW EXPECTED VALUES, AND PATIENT DOSAGE HAS BEEN CONFIRMED, SUGGEST FOLLOW UP TESTING OF ANTITHROMBIN III LEVELS. REPEATED TO VERIFY   Troponin I     Status: Abnormal   Collection Time: 02/03/16 11:44 PM  Result Value Ref Range   Troponin I 6.40 (HH) <0.03 ng/mL    Comment: CRITICAL VALUE NOTED.  VALUE IS CONSISTENT WITH PREVIOUSLY REPORTED AND CALLED VALUE.  Troponin I     Status: Abnormal   Collection Time: 02/04/16  2:46 AM  Result Value Ref Range   Troponin I 6.43 (HH) <0.03 ng/mL    Comment: CRITICAL VALUE NOTED.  VALUE IS CONSISTENT WITH PREVIOUSLY REPORTED AND CALLED VALUE.  Basic metabolic panel     Status: Abnormal   Collection Time: 02/04/16  2:46 AM  Result Value Ref Range   Sodium 138 135 - 145 mmol/L   Potassium 3.5 3.5 - 5.1 mmol/L   Chloride 100 (L) 101 - 111 mmol/L   CO2 28 22 - 32 mmol/L   Glucose, Bld 127  (H) 65 - 99  mg/dL   BUN 12 6 - 20 mg/dL   Creatinine, Ser 0.76 0.61 - 1.24 mg/dL   Calcium 9.0 8.9 - 10.3 mg/dL   GFR calc non Af Amer >60 >60 mL/min   GFR calc Af Amer >60 >60 mL/min    Comment: (NOTE) The eGFR has been calculated using the CKD EPI equation. This calculation has not been validated in all clinical situations. eGFR's persistently <60 mL/min signify possible Chronic Kidney Disease.    Anion gap 10 5 - 15  Lipid panel     Status: Abnormal   Collection Time: 02/04/16  2:46 AM  Result Value Ref Range   Cholesterol 150 0 - 200 mg/dL   Triglycerides 106 <150 mg/dL   HDL 23 (L) >40 mg/dL   Total CHOL/HDL Ratio 6.5 RATIO   VLDL 21 0 - 40 mg/dL   LDL Cholesterol 106 (H) 0 - 99 mg/dL    Comment:        Total Cholesterol/HDL:CHD Risk Coronary Heart Disease Risk Table                     Men   Women  1/2 Average Risk   3.4   3.3  Average Risk       5.0   4.4  2 X Average Risk   9.6   7.1  3 X Average Risk  23.4   11.0        Use the calculated Patient Ratio above and the CHD Risk Table to determine the patient's CHD Risk.        ATP III CLASSIFICATION (LDL):  <100     mg/dL   Optimal  100-129  mg/dL   Near or Above                    Optimal  130-159  mg/dL   Borderline  160-189  mg/dL   High  >190     mg/dL   Very High   CBC     Status: Abnormal   Collection Time: 02/04/16  2:46 AM  Result Value Ref Range   WBC 12.6 (H) 4.0 - 10.5 K/uL   RBC 4.78 4.22 - 5.81 MIL/uL   Hemoglobin 15.1 13.0 - 17.0 g/dL   HCT 45.0 39.0 - 52.0 %   MCV 94.1 78.0 - 100.0 fL   MCH 31.6 26.0 - 34.0 pg   MCHC 33.6 30.0 - 36.0 g/dL   RDW 13.7 11.5 - 15.5 %   Platelets 242 150 - 400 K/uL    Imaging: Imaging results have been reviewed . Diffuse interstitial edema C/W CHF  Tele- NSR 85  Assessment/Plan:   1. Principal Problem: 2.   NSTEMI (non-ST elevation myocardial infarction) ([place]) 3. Active Problems: 4.   Tobacco use 5.   COPD (chronic obstructive pulmonary disease)  ([place]) 6.   OSA (obstructive sleep apnea) 7.   Acute CHF ([place]) 8.   NSTEMI (non-ST elevated myocardial infarction) ([place]) 9.   Time Spent Directly with Patient:  20 minutes  Length of Stay:  LOS: 1 day  CP on 1/9 with pain and SOB since. Currently pain free on IV hep/NTG. Diuresis with IV lasix I/O neg 700 cc). Trop + 6 and BNP elevated at 600. EKG w/o acute changes. Exam benign. Story C/W OOH MI. Plan cor angio today. The patient understands that risks included but are not limited to stroke (1 in 1000), death (1 in 54), kidney failure [usually temporary] (1  in 500), bleeding (1 in 200), allergic reaction [possibly serious] (1 in 200). The patient understands and agrees to proceed  Quay Burow 02/04/2016, 9:00 AM

## 2016-02-04 NOTE — Progress Notes (Signed)
ANTICOAGULATION CONSULT NOTE - Follow Up Consult  Pharmacy Consult for heparin Indication: NSTEMI  Labs:  Recent Labs  02/03/16 0840 02/03/16 1450 02/03/16 2344  HGB 16.1  --   --   HCT 47.2  --   --   PLT 241  --   --   LABPROT  --  15.7*  --   INR  --  1.25  --   HEPARINUNFRC  --   --  <0.10*  CREATININE 0.68  --   --   TROPONINI  --  5.55* 6.40*    Assessment: 65yo male undetectable on heparin with initial dosing for NSTEMI.  Goal of Therapy:  Heparin level 0.3-0.7 units/ml   Plan:  Will rebolus with heparin 4000 units and increase gtt by 4 units/kgABW/hr to 2200 units/hr and check level in 6hr.  Wynona Neat, PharmD, BCPS  02/04/2016,2:08 AM

## 2016-02-04 NOTE — Progress Notes (Signed)
Subjective:  No further CP. SOB improved. On IV hep/NTG. NSTEMI. For cath radially this afternoon  Objective:  Temp:  [97.6 F (36.4 C)-98.5 F (36.9 C)] 97.7 F (36.5 C) (01/15 0756) Pulse Rate:  [76-117] 86 (01/15 0800) Resp:  [13-37] 16 (01/15 0800) BP: (104-145)/(57-93) 114/60 (01/15 0800) SpO2:  [89 %-97 %] 92 % (01/15 0840) Weight:  [330 lb 6.4 oz (149.9 kg)-344 lb (156 kg)] 330 lb 6.4 oz (149.9 kg) (01/15 0600) Weight change:   Intake/Output from previous day: 01/14 0701 - 01/15 0700 In: 738.4 [P.O.:240; I.V.:498.4] Out: 1520 [Urine:1520]  Intake/Output from this shift: Total I/O In: 45.8 [I.V.:45.8] Out: -   Physical Exam: General appearance: alert and no distress Neck: no adenopathy, no carotid bruit, no JVD, supple, symmetrical, trachea midline and thyroid not enlarged, symmetric, no tenderness/mass/nodules Lungs: clear to auscultation bilaterally Heart: regular rate and rhythm, S1, S2 normal, no murmur, click, rub or gallop Extremities: extremities normal, atraumatic, no cyanosis or edema  Lab Results: Results for orders placed or performed during the hospital encounter of 02/03/16 (from the past 48 hour(s))  Basic metabolic panel     Status: Abnormal   Collection Time: 02/03/16  8:40 AM  Result Value Ref Range   Sodium 138 135 - 145 mmol/L   Potassium 3.5 3.5 - 5.1 mmol/L   Chloride 100 (L) 101 - 111 mmol/L   CO2 25 22 - 32 mmol/L   Glucose, Bld 117 (H) 65 - 99 mg/dL   BUN 9 6 - 20 mg/dL   Creatinine, Ser 0.68 0.61 - 1.24 mg/dL   Calcium 9.4 8.9 - 10.3 mg/dL   GFR calc non Af Amer >60 >60 mL/min   GFR calc Af Amer >60 >60 mL/min    Comment: (NOTE) The eGFR has been calculated using the CKD EPI equation. This calculation has not been validated in all clinical situations. eGFR's persistently <60 mL/min signify possible Chronic Kidney Disease.    Anion gap 13 5 - 15  CBC     Status: Abnormal   Collection Time: 02/03/16  8:40 AM  Result Value  Ref Range   WBC 12.7 (H) 4.0 - 10.5 K/uL   RBC 5.02 4.22 - 5.81 MIL/uL   Hemoglobin 16.1 13.0 - 17.0 g/dL   HCT 47.2 39.0 - 52.0 %   MCV 94.0 78.0 - 100.0 fL   MCH 32.1 26.0 - 34.0 pg   MCHC 34.1 30.0 - 36.0 g/dL   RDW 13.7 11.5 - 15.5 %   Platelets 241 150 - 400 K/uL  I-stat troponin, ED     Status: Abnormal   Collection Time: 02/03/16  8:49 AM  Result Value Ref Range   Troponin i, poc 10.33 (HH) 0.00 - 0.08 ng/mL   Comment NOTIFIED PHYSICIAN    Comment 3            Comment: Due to the release kinetics of cTnI, a negative result within the first hours of the onset of symptoms does not rule out myocardial infarction with certainty. If myocardial infarction is still suspected, repeat the test at appropriate intervals.   Brain natriuretic peptide     Status: Abnormal   Collection Time: 02/03/16  8:59 AM  Result Value Ref Range   B Natriuretic Peptide 412.4 (H) 0.0 - 100.0 pg/mL  I-stat troponin, ED     Status: Abnormal   Collection Time: 02/03/16  9:54 AM  Result Value Ref Range   Troponin i, poc 9.23 (HH) 0.00 -  0.08 ng/mL   Comment NOTIFIED PHYSICIAN    Comment 3            Comment: Due to the release kinetics of cTnI, a negative result within the first hours of the onset of symptoms does not rule out myocardial infarction with certainty. If myocardial infarction is still suspected, repeat the test at appropriate intervals.   MRSA PCR Screening     Status: None   Collection Time: 02/03/16  2:34 PM  Result Value Ref Range   MRSA by PCR NEGATIVE NEGATIVE    Comment:        The GeneXpert MRSA Assay (FDA approved for NASAL specimens only), is one component of a comprehensive MRSA colonization surveillance program. It is not intended to diagnose MRSA infection nor to guide or monitor treatment for MRSA infections.   Hepatic function panel     Status: Abnormal   Collection Time: 02/03/16  2:50 PM  Result Value Ref Range   Total Protein 7.6 6.5 - 8.1 g/dL   Albumin  3.1 (L) 3.5 - 5.0 g/dL   AST 63 (H) 15 - 41 U/L   ALT 56 17 - 63 U/L   Alkaline Phosphatase 155 (H) 38 - 126 U/L   Total Bilirubin 1.5 (H) 0.3 - 1.2 mg/dL   Bilirubin, Direct 0.4 0.1 - 0.5 mg/dL   Indirect Bilirubin 1.1 (H) 0.3 - 0.9 mg/dL  TSH     Status: None   Collection Time: 02/03/16  2:50 PM  Result Value Ref Range   TSH 3.131 0.350 - 4.500 uIU/mL    Comment: Performed by a 3rd Generation assay with a functional sensitivity of <=0.01 uIU/mL.  Troponin I     Status: Abnormal   Collection Time: 02/03/16  2:50 PM  Result Value Ref Range   Troponin I 5.55 (HH) <0.03 ng/mL    Comment: CRITICAL RESULT CALLED TO, READ BACK BY AND VERIFIED WITH: S.MERLINI,RN 02/03/16 @1614  BY V.WILKINS   Protime-INR     Status: Abnormal   Collection Time: 02/03/16  2:50 PM  Result Value Ref Range   Prothrombin Time 15.7 (H) 11.4 - 15.2 seconds   INR 1.25   Heparin level (unfractionated)     Status: Abnormal   Collection Time: 02/03/16 11:44 PM  Result Value Ref Range   Heparin Unfractionated <0.10 (L) 0.30 - 0.70 IU/mL    Comment:        IF HEPARIN RESULTS ARE BELOW EXPECTED VALUES, AND PATIENT DOSAGE HAS BEEN CONFIRMED, SUGGEST FOLLOW UP TESTING OF ANTITHROMBIN III LEVELS. REPEATED TO VERIFY   Troponin I     Status: Abnormal   Collection Time: 02/03/16 11:44 PM  Result Value Ref Range   Troponin I 6.40 (HH) <0.03 ng/mL    Comment: CRITICAL VALUE NOTED.  VALUE IS CONSISTENT WITH PREVIOUSLY REPORTED AND CALLED VALUE.  Troponin I     Status: Abnormal   Collection Time: 02/04/16  2:46 AM  Result Value Ref Range   Troponin I 6.43 (HH) <0.03 ng/mL    Comment: CRITICAL VALUE NOTED.  VALUE IS CONSISTENT WITH PREVIOUSLY REPORTED AND CALLED VALUE.  Basic metabolic panel     Status: Abnormal   Collection Time: 02/04/16  2:46 AM  Result Value Ref Range   Sodium 138 135 - 145 mmol/L   Potassium 3.5 3.5 - 5.1 mmol/L   Chloride 100 (L) 101 - 111 mmol/L   CO2 28 22 - 32 mmol/L   Glucose, Bld 127  (H) 65 - 99  mg/dL   BUN 12 6 - 20 mg/dL   Creatinine, Ser 0.76 0.61 - 1.24 mg/dL   Calcium 9.0 8.9 - 10.3 mg/dL   GFR calc non Af Amer >60 >60 mL/min   GFR calc Af Amer >60 >60 mL/min    Comment: (NOTE) The eGFR has been calculated using the CKD EPI equation. This calculation has not been validated in all clinical situations. eGFR's persistently <60 mL/min signify possible Chronic Kidney Disease.    Anion gap 10 5 - 15  Lipid panel     Status: Abnormal   Collection Time: 02/04/16  2:46 AM  Result Value Ref Range   Cholesterol 150 0 - 200 mg/dL   Triglycerides 106 <150 mg/dL   HDL 23 (L) >40 mg/dL   Total CHOL/HDL Ratio 6.5 RATIO   VLDL 21 0 - 40 mg/dL   LDL Cholesterol 106 (H) 0 - 99 mg/dL    Comment:        Total Cholesterol/HDL:CHD Risk Coronary Heart Disease Risk Table                     Men   Women  1/2 Average Risk   3.4   3.3  Average Risk       5.0   4.4  2 X Average Risk   9.6   7.1  3 X Average Risk  23.4   11.0        Use the calculated Patient Ratio above and the CHD Risk Table to determine the patient's CHD Risk.        ATP III CLASSIFICATION (LDL):  <100     mg/dL   Optimal  100-129  mg/dL   Near or Above                    Optimal  130-159  mg/dL   Borderline  160-189  mg/dL   High  >190     mg/dL   Very High   CBC     Status: Abnormal   Collection Time: 02/04/16  2:46 AM  Result Value Ref Range   WBC 12.6 (H) 4.0 - 10.5 K/uL   RBC 4.78 4.22 - 5.81 MIL/uL   Hemoglobin 15.1 13.0 - 17.0 g/dL   HCT 45.0 39.0 - 52.0 %   MCV 94.1 78.0 - 100.0 fL   MCH 31.6 26.0 - 34.0 pg   MCHC 33.6 30.0 - 36.0 g/dL   RDW 13.7 11.5 - 15.5 %   Platelets 242 150 - 400 K/uL    Imaging: Imaging results have been reviewed . Diffuse interstitial edema C/W CHF  Tele- NSR 85  Assessment/Plan:   1. Principal Problem: 2.   NSTEMI (non-ST elevation myocardial infarction) ([place]) 3. Active Problems: 4.   Tobacco use 5.   COPD (chronic obstructive pulmonary disease)  ([place]) 6.   OSA (obstructive sleep apnea) 7.   Acute CHF ([place]) 8.   NSTEMI (non-ST elevated myocardial infarction) ([place]) 9.   Time Spent Directly with Patient:  20 minutes  Length of Stay:  LOS: 1 day  CP on 1/9 with pain and SOB since. Currently pain free on IV hep/NTG. Diuresis with IV lasix I/O neg 700 cc). Trop + 6 and BNP elevated at 600. EKG w/o acute changes. Exam benign. Story C/W OOH MI. Plan cor angio today. The patient understands that risks included but are not limited to stroke (1 in 1000), death (1 in 42), kidney failure [usually temporary] (1  in 500), bleeding (1 in 200), allergic reaction [possibly serious] (1 in 200). The patient understands and agrees to proceed  Quay Burow 02/04/2016, 9:00 AM

## 2016-02-04 NOTE — Progress Notes (Signed)
Pt placed on CPAP for the night with prior settings and 3lt FIO2 bleed in. Pt tol well at this time.

## 2016-02-04 NOTE — Interval H&P Note (Signed)
Cath Lab Visit (complete for each Cath Lab visit)  Clinical Evaluation Leading to the Procedure:   ACS: Yes.    Non-ACS:    Anginal Classification: CCS IV  Anti-ischemic medical therapy: Minimal Therapy (1 class of medications)  Non-Invasive Test Results: No non-invasive testing performed  Prior CABG: No previous CABG      History and Physical Interval Note:  02/04/2016 1:13 PM  John Griffin  has presented today for surgery, with the diagnosis of NSTEMI  The various methods of treatment have been discussed with the patient and family. After consideration of risks, benefits and other options for treatment, the patient has consented to  Procedure(s): Left Heart Cath and Coronary Angiography (N/A) as a surgical intervention .  The patient's history has been reviewed, patient examined, no change in status, stable for surgery.  I have reviewed the patient's chart and labs.  Questions were answered to the patient's satisfaction.     Quay Burow

## 2016-02-05 ENCOUNTER — Inpatient Hospital Stay (HOSPITAL_COMMUNITY): Payer: Medicare Other

## 2016-02-05 DIAGNOSIS — R079 Chest pain, unspecified: Secondary | ICD-10-CM

## 2016-02-05 DIAGNOSIS — I5041 Acute combined systolic (congestive) and diastolic (congestive) heart failure: Secondary | ICD-10-CM

## 2016-02-05 LAB — BASIC METABOLIC PANEL
ANION GAP: 9 (ref 5–15)
BUN: 19 mg/dL (ref 6–20)
CALCIUM: 8.6 mg/dL — AB (ref 8.9–10.3)
CO2: 29 mmol/L (ref 22–32)
Chloride: 98 mmol/L — ABNORMAL LOW (ref 101–111)
Creatinine, Ser: 0.77 mg/dL (ref 0.61–1.24)
GFR calc Af Amer: >60 mL/min (ref >60)
GFR calc non Af Amer: >60 mL/min (ref >60)
GLUCOSE: 148 mg/dL — AB (ref 65–99)
POTASSIUM: 3.4 mmol/L — AB (ref 3.5–5.1)
Sodium: 136 mmol/L (ref 135–145)

## 2016-02-05 LAB — CBC
HEMATOCRIT: 44.3 % (ref 39.0–52.0)
Hemoglobin: 15 g/dL (ref 13.0–17.0)
MCH: 31.9 pg (ref 26.0–34.0)
MCHC: 33.9 g/dL (ref 30.0–36.0)
MCV: 94.3 fL (ref 78.0–100.0)
Platelets: 226 10*3/uL (ref 150–400)
RBC: 4.7 MIL/uL (ref 4.22–5.81)
RDW: 13.9 % (ref 11.5–15.5)
WBC: 14.3 10*3/uL — AB (ref 4.0–10.5)

## 2016-02-05 LAB — ECHOCARDIOGRAM COMPLETE
Height: 76 in
WEIGHTICAEL: 5286.4 [oz_av]

## 2016-02-05 MED ORDER — BISOPROLOL FUMARATE 5 MG PO TABS
5.0000 mg | ORAL_TABLET | Freq: Every day | ORAL | Status: DC
  Administered 2016-02-05 – 2016-02-06 (×2): 5 mg via ORAL
  Filled 2016-02-05 (×2): qty 1

## 2016-02-05 MED ORDER — FUROSEMIDE 20 MG PO TABS
20.0000 mg | ORAL_TABLET | Freq: Every day | ORAL | Status: DC
  Administered 2016-02-05 – 2016-02-06 (×2): 20 mg via ORAL
  Filled 2016-02-05 (×2): qty 1

## 2016-02-05 MED ORDER — PERFLUTREN LIPID MICROSPHERE
1.0000 mL | INTRAVENOUS | Status: AC | PRN
  Administered 2016-02-05: 2 mL via INTRAVENOUS
  Filled 2016-02-05: qty 10

## 2016-02-05 MED ORDER — PERFLUTREN LIPID MICROSPHERE
INTRAVENOUS | Status: AC
  Filled 2016-02-05: qty 10

## 2016-02-05 NOTE — Progress Notes (Signed)
Report called to receiving Rn [id number]. Patient with no complaints at current time. Will transfer via [place].

## 2016-02-05 NOTE — Progress Notes (Signed)
SATURATION QUALIFICATIONS: (This note is used to comply with regulatory documentation for home oxygen)  Patient Saturations on Room Air at Rest = 92%  Patient Saturations on Room Air while Ambulating = 92%   

## 2016-02-05 NOTE — Progress Notes (Signed)
  Echocardiogram 2D Echocardiogram has been performed.  John Griffin 02/05/2016, 1:56 PM

## 2016-02-05 NOTE — Progress Notes (Signed)
Subjective:  No CP/SOB, S/P LHC with Tot LCX L---> L collat and mod LAD/RCA Dx with preserved LV FXN.  Objective:  Temp:  [98.1 F (36.7 C)-99.1 F (37.3 C)] 98.1 F (36.7 C) (01/16 0802) Pulse Rate:  [0-102] 76 (01/16 0700) Resp:  [10-38] 28 (01/16 0700) BP: (81-120)/(50-81) 91/62 (01/16 0700) SpO2:  [0 %-95 %] 91 % (01/16 0700) Weight change:   Intake/Output from previous day: 01/15 0701 - 01/16 0700 In: 495.8 [I.V.:495.8] Out: 1750 [Urine:1750]  Intake/Output from this shift: No intake/output data recorded.  Physical Exam: General appearance: alert and no distress Neck: no adenopathy, no carotid bruit, no JVD, supple, symmetrical, trachea midline and thyroid not enlarged, symmetric, no tenderness/mass/nodules Lungs: clear to auscultation bilaterally Heart: regular rate and rhythm, S1, S2 normal, no murmur, click, rub or gallop Extremities: extremities normal, atraumatic, no cyanosis or edema and Right radial OK  Lab Results: Results for orders placed or performed during the hospital encounter of 02/03/16 (from the past 48 hour(s))  Basic metabolic panel     Status: Abnormal   Collection Time: 02/03/16  8:40 AM  Result Value Ref Range   Sodium 138 135 - 145 mmol/L   Potassium 3.5 3.5 - 5.1 mmol/L   Chloride 100 (L) 101 - 111 mmol/L   CO2 25 22 - 32 mmol/L   Glucose, Bld 117 (H) 65 - 99 mg/dL   BUN 9 6 - 20 mg/dL   Creatinine, Ser 0.68 0.61 - 1.24 mg/dL   Calcium 9.4 8.9 - 10.3 mg/dL   GFR calc non Af Amer >60 >60 mL/min   GFR calc Af Amer >60 >60 mL/min    Comment: (NOTE) The eGFR has been calculated using the CKD EPI equation. This calculation has not been validated in all clinical situations. eGFR's persistently <60 mL/min signify possible Chronic Kidney Disease.    Anion gap 13 5 - 15  CBC     Status: Abnormal   Collection Time: 02/03/16  8:40 AM  Result Value Ref Range   WBC 12.7 (H) 4.0 - 10.5 K/uL   RBC 5.02 4.22 - 5.81 MIL/uL   Hemoglobin 16.1  13.0 - 17.0 g/dL   HCT 47.2 39.0 - 52.0 %   MCV 94.0 78.0 - 100.0 fL   MCH 32.1 26.0 - 34.0 pg   MCHC 34.1 30.0 - 36.0 g/dL   RDW 13.7 11.5 - 15.5 %   Platelets 241 150 - 400 K/uL  I-stat troponin, ED     Status: Abnormal   Collection Time: 02/03/16  8:49 AM  Result Value Ref Range   Troponin i, poc 10.33 (HH) 0.00 - 0.08 ng/mL   Comment NOTIFIED PHYSICIAN    Comment 3            Comment: Due to the release kinetics of cTnI, a negative result within the first hours of the onset of symptoms does not rule out myocardial infarction with certainty. If myocardial infarction is still suspected, repeat the test at appropriate intervals.   Brain natriuretic peptide     Status: Abnormal   Collection Time: 02/03/16  8:59 AM  Result Value Ref Range   B Natriuretic Peptide 412.4 (H) 0.0 - 100.0 pg/mL  I-stat troponin, ED     Status: Abnormal   Collection Time: 02/03/16  9:54 AM  Result Value Ref Range   Troponin i, poc 9.23 (HH) 0.00 - 0.08 ng/mL   Comment NOTIFIED PHYSICIAN    Comment 3  Comment: Due to the release kinetics of cTnI, a negative result within the first hours of the onset of symptoms does not rule out myocardial infarction with certainty. If myocardial infarction is still suspected, repeat the test at appropriate intervals.   MRSA PCR Screening     Status: None   Collection Time: 02/03/16  2:34 PM  Result Value Ref Range   MRSA by PCR NEGATIVE NEGATIVE    Comment:        The GeneXpert MRSA Assay (FDA approved for NASAL specimens only), is one component of a comprehensive MRSA colonization surveillance program. It is not intended to diagnose MRSA infection nor to guide or monitor treatment for MRSA infections.   Hepatic function panel     Status: Abnormal   Collection Time: 02/03/16  2:50 PM  Result Value Ref Range   Total Protein 7.6 6.5 - 8.1 g/dL   Albumin 3.1 (L) 3.5 - 5.0 g/dL   AST 63 (H) 15 - 41 U/L   ALT 56 17 - 63 U/L   Alkaline  Phosphatase 155 (H) 38 - 126 U/L   Total Bilirubin 1.5 (H) 0.3 - 1.2 mg/dL   Bilirubin, Direct 0.4 0.1 - 0.5 mg/dL   Indirect Bilirubin 1.1 (H) 0.3 - 0.9 mg/dL  TSH     Status: None   Collection Time: 02/03/16  2:50 PM  Result Value Ref Range   TSH 3.131 0.350 - 4.500 uIU/mL    Comment: Performed by a 3rd Generation assay with a functional sensitivity of <=0.01 uIU/mL.  Hemoglobin A1c     Status: Abnormal   Collection Time: 02/03/16  2:50 PM  Result Value Ref Range   Hgb A1c MFr Bld 6.0 (H) 4.8 - 5.6 %    Comment: (NOTE)         Pre-diabetes: 5.7 - 6.4         Diabetes: >6.4         Glycemic control for adults with diabetes: <7.0    Mean Plasma Glucose 126 mg/dL    Comment: (NOTE) Performed At: [facility] [place], [place] [id number] Lindon Romp MD [id number]   Troponin I     Status: Abnormal   Collection Time: 02/03/16  2:50 PM  Result Value Ref Range   Troponin I 5.55 (HH) <0.03 ng/mL    Comment: CRITICAL RESULT CALLED TO, READ BACK BY AND VERIFIED WITH: S.MERLINI,RN 02/03/16 @1614  BY V.WILKINS   Protime-INR     Status: Abnormal   Collection Time: 02/03/16  2:50 PM  Result Value Ref Range   Prothrombin Time 15.7 (H) 11.4 - 15.2 seconds   INR 1.25   Heparin level (unfractionated)     Status: Abnormal   Collection Time: 02/03/16 11:44 PM  Result Value Ref Range   Heparin Unfractionated <0.10 (L) 0.30 - 0.70 IU/mL    Comment:        IF HEPARIN RESULTS ARE BELOW EXPECTED VALUES, AND PATIENT DOSAGE HAS BEEN CONFIRMED, SUGGEST FOLLOW UP TESTING OF ANTITHROMBIN III LEVELS. REPEATED TO VERIFY   Troponin I     Status: Abnormal   Collection Time: 02/03/16 11:44 PM  Result Value Ref Range   Troponin I 6.40 (HH) <0.03 ng/mL    Comment: CRITICAL VALUE NOTED.  VALUE IS CONSISTENT WITH PREVIOUSLY REPORTED AND CALLED VALUE.  Troponin I     Status: Abnormal   Collection Time: 02/04/16  2:46 AM  Result Value Ref Range   Troponin I 6.43 (HH)  <0.03  ng/mL    Comment: CRITICAL VALUE NOTED.  VALUE IS CONSISTENT WITH PREVIOUSLY REPORTED AND CALLED VALUE.  Basic metabolic panel     Status: Abnormal   Collection Time: 02/04/16  2:46 AM  Result Value Ref Range   Sodium 138 135 - 145 mmol/L   Potassium 3.5 3.5 - 5.1 mmol/L   Chloride 100 (L) 101 - 111 mmol/L   CO2 28 22 - 32 mmol/L   Glucose, Bld 127 (H) 65 - 99 mg/dL   BUN 12 6 - 20 mg/dL   Creatinine, Ser 0.76 0.61 - 1.24 mg/dL   Calcium 9.0 8.9 - 10.3 mg/dL   GFR calc non Af Amer >60 >60 mL/min   GFR calc Af Amer >60 >60 mL/min    Comment: (NOTE) The eGFR has been calculated using the CKD EPI equation. This calculation has not been validated in all clinical situations. eGFR's persistently <60 mL/min signify possible Chronic Kidney Disease.    Anion gap 10 5 - 15  Lipid panel     Status: Abnormal   Collection Time: 02/04/16  2:46 AM  Result Value Ref Range   Cholesterol 150 0 - 200 mg/dL   Triglycerides 106 <150 mg/dL   HDL 23 (L) >40 mg/dL   Total CHOL/HDL Ratio 6.5 RATIO   VLDL 21 0 - 40 mg/dL   LDL Cholesterol 106 (H) 0 - 99 mg/dL    Comment:        Total Cholesterol/HDL:CHD Risk Coronary Heart Disease Risk Table                     Men   Women  1/2 Average Risk   3.4   3.3  Average Risk       5.0   4.4  2 X Average Risk   9.6   7.1  3 X Average Risk  23.4   11.0        Use the calculated Patient Ratio above and the CHD Risk Table to determine the patient's CHD Risk.        ATP III CLASSIFICATION (LDL):  <100     mg/dL   Optimal  100-129  mg/dL   Near or Above                    Optimal  130-159  mg/dL   Borderline  160-189  mg/dL   High  >190     mg/dL   Very High   CBC     Status: Abnormal   Collection Time: 02/04/16  2:46 AM  Result Value Ref Range   WBC 12.6 (H) 4.0 - 10.5 K/uL   RBC 4.78 4.22 - 5.81 MIL/uL   Hemoglobin 15.1 13.0 - 17.0 g/dL   HCT 45.0 39.0 - 52.0 %   MCV 94.1 78.0 - 100.0 fL   MCH 31.6 26.0 - 34.0 pg   MCHC 33.6 30.0 - 36.0  g/dL   RDW 13.7 11.5 - 15.5 %   Platelets 242 150 - 400 K/uL  Heparin level (unfractionated)     Status: None   Collection Time: 02/04/16  8:23 AM  Result Value Ref Range   Heparin Unfractionated 0.36 0.30 - 0.70 IU/mL    Comment:        IF HEPARIN RESULTS ARE BELOW EXPECTED VALUES, AND PATIENT DOSAGE HAS BEEN CONFIRMED, SUGGEST FOLLOW UP TESTING OF ANTITHROMBIN III LEVELS.   CBC     Status: Abnormal   Collection Time: 02/05/16  2:24 AM  Result Value Ref Range   WBC 14.3 (H) 4.0 - 10.5 K/uL   RBC 4.70 4.22 - 5.81 MIL/uL   Hemoglobin 15.0 13.0 - 17.0 g/dL   HCT 44.3 39.0 - 52.0 %   MCV 94.3 78.0 - 100.0 fL   MCH 31.9 26.0 - 34.0 pg   MCHC 33.9 30.0 - 36.0 g/dL   RDW 13.9 11.5 - 15.5 %   Platelets 226 150 - 400 K/uL  Basic metabolic panel     Status: Abnormal   Collection Time: 02/05/16  2:24 AM  Result Value Ref Range   Sodium 136 135 - 145 mmol/L   Potassium 3.4 (L) 3.5 - 5.1 mmol/L   Chloride 98 (L) 101 - 111 mmol/L   CO2 29 22 - 32 mmol/L   Glucose, Bld 148 (H) 65 - 99 mg/dL   BUN 19 6 - 20 mg/dL   Creatinine, Ser 0.77 0.61 - 1.24 mg/dL   Calcium 8.6 (L) 8.9 - 10.3 mg/dL   GFR calc non Af Amer >60 >60 mL/min   GFR calc Af Amer >60 >60 mL/min    Comment: (NOTE) The eGFR has been calculated using the CKD EPI equation. This calculation has not been validated in all clinical situations. eGFR's persistently <60 mL/min signify possible Chronic Kidney Disease.    Anion gap 9 5 - 15    Imaging: Imaging results have been reviewed   Tele- NSR  Assessment/Plan:   1. Principal Problem: 2.   NSTEMI (non-ST elevation myocardial infarction) ([place]) 3. Active Problems: 4.   Tobacco use 5.   COPD (chronic obstructive pulmonary disease) ([place]) 6.   OSA (obstructive sleep apnea) 7.   Acute CHF ([place]) 8.   Non-STEMI (non-ST elevated myocardial infarction) ([place]) 9.   Time Spent Directly with Patient:  20 minutes  Length of Stay:  LOS: 2 days   POD # 1 LHC revealing  Tot LCX with L--> L collat, mod LAD/RCA Dx and preserved LV FXN. No indication for intervention. Pt denies CP/SOB. 8 days s/p OOH NSTEMI. Peak trop 6. Pt has agreed to stop smoking. Will adjust meds, put on low dose diuretic (LVEDP was elevated), Tx to tele. [facility]. Ambulate. Prob home AM.  Quay Burow 02/05/2016, 8:12 AM

## 2016-02-06 DIAGNOSIS — G4733 Obstructive sleep apnea (adult) (pediatric): Secondary | ICD-10-CM

## 2016-02-06 LAB — BASIC METABOLIC PANEL
ANION GAP: 9 (ref 5–15)
BUN: 18 mg/dL (ref 6–20)
CHLORIDE: 101 mmol/L (ref 101–111)
CO2: 28 mmol/L (ref 22–32)
Calcium: 8.8 mg/dL — ABNORMAL LOW (ref 8.9–10.3)
Creatinine, Ser: 0.84 mg/dL (ref 0.61–1.24)
Glucose, Bld: 160 mg/dL — ABNORMAL HIGH (ref 65–99)
POTASSIUM: 3.7 mmol/L (ref 3.5–5.1)
Sodium: 138 mmol/L (ref 135–145)

## 2016-02-06 LAB — BRAIN NATRIURETIC PEPTIDE: B NATRIURETIC PEPTIDE 5: 255.5 pg/mL — AB (ref 0.0–100.0)

## 2016-02-06 MED ORDER — ATORVASTATIN CALCIUM 80 MG PO TABS: 80.0000 mg | ORAL_TABLET | Freq: Every day | ORAL | 6 refills | Status: DC

## 2016-02-06 MED ORDER — CLOPIDOGREL BISULFATE 75 MG PO TABS: 75.0000 mg | ORAL_TABLET | Freq: Every day | ORAL | Status: DC

## 2016-02-06 MED ORDER — BISOPROLOL FUMARATE 5 MG PO TABS: 5.0000 mg | ORAL_TABLET | Freq: Every day | ORAL | 6 refills | Status: DC

## 2016-02-06 MED ORDER — CLOPIDOGREL BISULFATE 75 MG PO TABS: 75.0000 mg | ORAL_TABLET | Freq: Every day | ORAL | 6 refills | Status: DC

## 2016-02-06 MED ORDER — LISINOPRIL 5 MG PO TABS: 5.0000 mg | ORAL_TABLET | Freq: Every day | ORAL | 6 refills | Status: DC

## 2016-02-06 MED ORDER — CLOPIDOGREL BISULFATE 75 MG PO TABS
300.0000 mg | ORAL_TABLET | Freq: Once | ORAL | Status: AC
  Administered 2016-02-06: 300 mg via ORAL
  Filled 2016-02-06: qty 4

## 2016-02-06 MED ORDER — FUROSEMIDE 20 MG PO TABS: 20.0000 mg | ORAL_TABLET | Freq: Every day | ORAL | 6 refills | Status: DC

## 2016-02-06 MED ORDER — NITROGLYCERIN 0.4 MG SL SUBL: 0.4000 mg | SUBLINGUAL_TABLET | SUBLINGUAL | 2 refills | Status: DC | PRN

## 2016-02-06 MED ORDER — POTASSIUM CHLORIDE CRYS ER 10 MEQ PO TBCR
10.0000 meq | EXTENDED_RELEASE_TABLET | Freq: Every day | ORAL | 6 refills | Status: DC
Start: 2016-02-06 — End: 2016-08-24

## 2016-02-06 MED ORDER — ISOSORBIDE MONONITRATE ER 30 MG PO TB24: 30.0000 mg | ORAL_TABLET | Freq: Every day | ORAL | 6 refills | Status: DC

## 2016-02-06 MED ORDER — FUROSEMIDE 20 MG PO TABS
20.0000 mg | ORAL_TABLET | Freq: Once | ORAL | Status: AC
  Administered 2016-02-06: 20 mg via ORAL
  Filled 2016-02-06: qty 1

## 2016-02-06 MED ORDER — ASPIRIN 81 MG PO CHEW: 81.0000 mg | CHEWABLE_TABLET | Freq: Every day | ORAL | Status: AC

## 2016-02-06 NOTE — Discharge Summary (Signed)
Discharge Summary    Patient ID: John Griffin,  MRN: [id number], DOB/AGE: [birth year] 66 y.o.  Admit date: 02/03/2016 Discharge date: 02/06/2016  Primary Care Provider: [facility] Primary Cardiologist: Dr. [name]   Discharge Diagnoses    Principal Problem:   NSTEMI (non-ST elevation myocardial infarction) [facility]) Active Problems:   Tobacco use   COPD (chronic obstructive pulmonary disease) ([facility])   OSA (obstructive sleep apnea)   Acute CHF ([place])   History of Present Illness     John Griffin is a 66 y.o. male with past medical history of COPD, OSA, and prostate cancer (s/p prostatectomy) who presented to [facility] ED on 02/03/2016 for evaluation of chest pain. This had initially started 1 week prior to him presenting to the ED. Reported associated dyspnea and bilateral arm pain, with orthopnea which was preventing him from sleeping.   Upon arrival to the ER, his initial troponin was elevated to 10.33. BNP at 412. EKG was without acute ischemic changes. Was started on IV Heparin and NTG drip and admitted for further observation.   Hospital Course     Consultants: None  The following morning, he denied any repeat episodes of chest discomfort and breathing improved with administration of IV Lasix. With his elevated troponin values and presenting symptoms, a cardiac catheterization was recommended for definitive evaluation. This was performed on 1/15 and showed a total Prox Cx occlusion with collaterals present. Mid-RCA with 70% stenosis, dist-RCA 60%, Ost LAS 70% and 1st Diag 75% stenosis. EF was preserved in the 50-55% range with inferoapical hypokinesia by cath. With his completed infarct and no recurrent angina, medical management was recommended. It was recommended to consider outpatient stress testing to look for viable myocardium and inducible ischemia. He was started on a BB (Bisoprolol with concurrent COPD), Lisinopril, Imdur and 81mg  ASA.  An echocardiogram was performed  the following morning which showed an EF of 45% to 50% with Grade 2 DD. Lipid Panel with total cholesterol 150, HDL 23, and LDL 106.  On 02/06/2016, he denied any episodes of chest pain. BB was not further titrated due to transient episodes of hypotension but Plavix was added to his medication regimen in the setting of an NSTEMI. He will also be on low-dose Lasix 20mg  daily as he still has trace edema on examination.    He was last examined by Dr. [name] and deemed stable for discharge. A staff message has been sent to the office to arrange for a 7-day TOC appointment.   _____________  Discharge Vitals Blood pressure (!) 135/93, pulse 98, temperature 98.4 F (36.9 C), temperature source Oral, resp. rate 16, height 6\' 4"  (1.93 m), weight (!) 331 lb 12.8 oz (150.5 kg), SpO2 95 %.  Filed Weights   02/03/16 1430 02/04/16 0600 02/06/16 0314  Weight: (!) 335 lb 15.7 oz (152.4 kg) (!) 330 lb 6.4 oz (149.9 kg) (!) 331 lb 12.8 oz (150.5 kg)    Labs & Radiologic Studies     CBC  Recent Labs  02/04/16 0246 02/05/16 0224  WBC 12.6* 14.3*  HGB 15.1 15.0  HCT 45.0 44.3  MCV 94.1 94.3  PLT 242 [id number]   Basic Metabolic Panel  Recent Labs  02/05/16 0224 02/06/16 0451  NA 136 138  K 3.4* 3.7  CL 98* 101  CO2 29 28  GLUCOSE 148* 160*  BUN 19 18  CREATININE 0.77 0.84  CALCIUM 8.6* 8.8*   Liver Function Tests  Recent Labs  02/03/16 1450  AST  63*  ALT 56  ALKPHOS 155*  BILITOT 1.5*  PROT 7.6  ALBUMIN 3.1*   No results for input(s): LIPASE, AMYLASE in the last 72 hours. Cardiac Enzymes  Recent Labs  02/03/16 1450 02/03/16 2344 02/04/16 0246  TROPONINI 5.55* 6.40* 6.43*   BNP Invalid input(s): POCBNP D-Dimer No results for input(s): DDIMER in the last 72 hours. Hemoglobin A1C  Recent Labs  02/03/16 1450  HGBA1C 6.0*   Fasting Lipid Panel  Recent Labs  02/04/16 0246  CHOL 150  HDL 23*  LDLCALC 106*  TRIG 106  CHOLHDL 6.5   Thyroid Function Tests  Recent  Labs  02/03/16 1450  TSH 3.131    Dg Chest 2 View  Result Date: 02/03/2016 CLINICAL DATA:  Acid reflux Tuesday, increased shortness of breath and chest pain radiating to neck and shoulders since, history COPD, hypertension, prostate cancer, former smoker EXAM: CHEST  2 VIEW COMPARISON:  02/25/2015 FINDINGS: Enlargement of cardiac silhouette with pulmonary vascular congestion. Mediastinal contours normal. Perihilar to basilar pulmonary infiltrates bilaterally question pulmonary edema though infection not excluded. No pleural effusion or pneumothorax. Bones demineralized with RIGHT AC joint degenerative changes noted. IMPRESSION: Enlargement of cardiac silhouette with pulmonary vascular congestion. BILATERAL pulmonary infiltrates favor pulmonary edema over infection. Electronically Signed   By: Lavonia Dana M.D.   On: 02/03/2016 10:05     Diagnostic Studies/Procedures    Cardiac Catheterization: 02/04/2016  There is mild left ventricular systolic dysfunction.  LV end diastolic pressure is moderately elevated.  The left ventricular ejection fraction is 50-55% by visual estimate.  Mid RCA lesion, 70 %stenosed.  Dist RCA lesion, 60 %stenosed.  Prox Cx lesion, 100 %stenosed.  Ost LAD to Prox LAD lesion, 70 %stenosed.  1st Diag lesion, 75 %stenosed.  IMPRESSION: Mr. Bucco has a total circumflex with grade 2-3 collaterals the distal OM branches. He does have moderate proximal LAD and diagonal branch disease as well as moderate mid dominant RCA disease. His EF is preserved in the 50-55% range with inferoapical hypokinesia. I believe he had a completed infarct proximally 6 days ago with post-MI angina. He's been pain free since he's been the hospital. At this point, I think we will treat him medically and reserve intervention on the circumflex for recalcitrant symptoms. We may consider outpatient stress testing to look for viable myocardium and inducible ischemia.   Echocardiogram:  02/05/2016 Study Conclusions  - Left ventricle: The cavity size was normal. Wall thickness was   normal. Systolic function was mildly reduced. The estimated   ejection fraction was in the range of 45% to 50%. Features are   consistent with a pseudonormal left ventricular filling pattern,   with concomitant abnormal relaxation and increased filling   pressure (grade 2 diastolic dysfunction). - Left atrium: The atrium was mildly dilated. - Right ventricle: Systolic function was mildly reduced.  Disposition   Pt is being discharged home today in good condition.  Follow-up Plans & Appointments    Follow-up Information    Quay Burow, MD Follow up.   Specialties:  Cardiology, Radiology Why:  The office will contact you to arrange follow-up. If you do not hear from them in the next 3 business days, please call the number provided.  Contact information: [address] [place] [address] [phone]          Discharge Instructions    Amb Referral to Cardiac Rehabilitation    Complete by:  As directed    Diagnosis:  NSTEMI  Diet - low sodium heart healthy    Complete by:  As directed    Discharge instructions    Complete by:  As directed    PLEASE REMEMBER TO BRING ALL OF YOUR MEDICATIONS TO EACH OF YOUR FOLLOW-UP OFFICE VISITS.  PLEASE ATTEND ALL SCHEDULED FOLLOW-UP APPOINTMENTS.   Activity: Increase activity slowly as tolerated. You may shower, but no soaking baths (or swimming) for 1 week. No driving for 1 week. No lifting over 10 lbs for 2 weeks. No sexual activity for 2 weeks.   You May Return to Work: in 1 week (if applicable)  Wound Care: You may wash cath site gently with soap and water. Keep cath site clean and dry. If you notice pain, swelling, bleeding or pus at your cath site, please call [phone].   Increase activity slowly    Complete by:  As directed       Discharge Medications     Medication List    TAKE these medications    acetaminophen 500 MG tablet Commonly known as:  TYLENOL Take 1,000 mg by mouth every 6 (six) hours as needed for mild pain. Reported on 05/09/2015   albuterol 108 (90 Base) MCG/ACT inhaler Commonly known as:  PROVENTIL HFA;VENTOLIN HFA Inhale 2 puffs into the lungs every 4 (four) hours as needed for wheezing or shortness of breath.   aspirin 81 MG chewable tablet Chew 1 tablet (81 mg total) by mouth daily. Start taking on:  02/07/2016   atorvastatin 80 MG tablet Commonly known as:  LIPITOR Take 1 tablet (80 mg total) by mouth daily at 6 PM. Start taking on:  02/07/2016   bisoprolol 5 MG tablet Commonly known as:  ZEBETA Take 1 tablet (5 mg total) by mouth daily. Start taking on:  02/07/2016   budesonide-formoterol 160-4.5 MCG/ACT inhaler Commonly known as:  SYMBICORT Inhale 2 puffs into the lungs 2 (two) times daily.   calcium carbonate 750 MG chewable tablet Commonly known as:  TUMS EX Chew 1,500 mg by mouth as needed for heartburn.   cetirizine 10 MG tablet Commonly known as:  ZYRTEC Take 10 mg by mouth daily. Reported on 03/14/2015   clopidogrel 75 MG tablet Commonly known as:  PLAVIX Take 1 tablet (75 mg total) by mouth daily. Start taking on:  02/07/2016   fluticasone 50 MCG/ACT nasal spray Commonly known as:  FLONASE Place 1 spray into both nostrils daily.   furosemide 20 MG tablet Commonly known as:  LASIX Take 1 tablet (20 mg total) by mouth daily. Start taking on:  02/07/2016   isosorbide mononitrate 30 MG 24 hr tablet Commonly known as:  IMDUR Take 1 tablet (30 mg total) by mouth daily. Start taking on:  02/07/2016   lisinopril 5 MG tablet Commonly known as:  PRINIVIL,ZESTRIL Take 1 tablet (5 mg total) by mouth daily. Start taking on:  02/07/2016   nitroGLYCERIN 0.4 MG SL tablet Commonly known as:  NITROSTAT Place 1 tablet (0.4 mg total) under the tongue every 5 (five) minutes x 3 doses as needed for chest pain.   omeprazole 20 MG capsule Commonly  known as:  PRILOSEC Take 40 mg by mouth daily.   potassium chloride 10 MEQ tablet Commonly known as:  K-DUR,KLOR-CON Take 1 tablet (10 mEq total) by mouth daily.       Aspirin prescribed at discharge?  Yes High Intensity Statin Prescribed? (Lipitor 40-80mg  or Crestor 20-40mg ): Yes Beta Blocker Prescribed? Yes For EF 40% or less, Was ACEI/ARB Prescribed? No: But  was placed on Lisinopril ADP Receptor Inhibitor Prescribed? (i.e. Plavix etc.-Includes Medically Managed Patients): Yes For EF <40%, Aldosterone Inhibitor Prescribed? No: EF > 40% Was EF assessed during THIS hospitalization? Yes Was Cardiac Rehab II ordered? (Included Medically managed Patients): No   Allergies No Known Allergies   Outstanding Labs/Studies   BMET at follow-up appointment  Duration of Discharge Encounter   Greater than 30 minutes including physician time.  Signed, Erma Heritage, PA-C 02/06/2016, 11:46 AM

## 2016-02-06 NOTE — Progress Notes (Signed)
Patient Name: John Griffin Date of Encounter: 02/06/2016  Primary Cardiologist: Dr. Serena Croissant Problem List     Principal Problem:   NSTEMI (non-ST elevation myocardial infarction) [facility]) Active Problems:   Tobacco use   COPD (chronic obstructive pulmonary disease) (HCC)   OSA (obstructive sleep apnea)   Acute CHF ([place])   Non-STEMI (non-ST elevated myocardial infarction) ([place])     Subjective   Denies any chest pain. He had chest burning sensation last Tuesday (1 week ago). Ambulated in the hallway yesterday, no problem   Inpatient Medications    Scheduled Meds: . aspirin  81 mg Oral Daily  . atorvastatin  80 mg Oral q1800  . bisoprolol  5 mg Oral Daily  . fluticasone  1 spray Each Nare Daily  . furosemide  20 mg Oral Daily  . isosorbide mononitrate  30 mg Oral Daily  . lisinopril  5 mg Oral Daily  . loratadine  10 mg Oral Daily  . mometasone-formoterol  2 puff Inhalation BID  . pantoprazole  40 mg Oral Daily  . potassium chloride  20 mEq Oral BID  . sodium chloride flush  3 mL Intravenous Q12H   Continuous Infusions: . sodium chloride 10 mL/hr at 02/03/16 1219   PRN Meds: sodium chloride, acetaminophen, albuterol, calcium carbonate, morphine injection, nitroGLYCERIN, ondansetron (ZOFRAN) IV, sodium chloride flush   Vital Signs    Vitals:   02/05/16 1450 02/05/16 1935 02/05/16 2157 02/06/16 0314  BP: 97/61  114/64 (!) 135/93  Pulse: 82  99 98  Resp:    16  Temp: 97.9 F (36.6 C)  99.8 F (37.7 C) 98.4 F (36.9 C)  TempSrc: Oral  Oral Oral  SpO2: 92% 93% 95% 95%  Weight:    (!) 331 lb 12.8 oz (150.5 kg)  Height:        Intake/Output Summary (Last 24 hours) at 02/06/16 0818 Last data filed at 02/06/16 0315  Gross per 24 hour  Intake              900 ml  Output              750 ml  Net              150 ml   Filed Weights   02/03/16 1430 02/04/16 0600 02/06/16 0314  Weight: (!) 335 lb 15.7 oz (152.4 kg) (!) 330 lb 6.4 oz (149.9 kg) (!) 331 lb  12.8 oz (150.5 kg)    Physical Exam   GEN: Well nourished, well developed, in no acute distress.  HEENT: Grossly normal.  Neck: Supple, no JVD, carotid bruits, or masses. Cardiac: RRR, no murmurs, rubs, or gallops. No clubbing, cyanosis, edema.  Radials/DP/PT 2+ and equal bilaterally.  Respiratory:  Decreased breath sound bilaterally with minimal crackles near the base GI: Soft, nontender, nondistended, BS + x 4. MS: no deformity or atrophy. Skin: warm and dry, no rash. Neuro:  Strength and sensation are intact. Psych: AAOx3.  Normal affect.  Labs    CBC  Recent Labs  02/04/16 0246 02/05/16 0224  WBC 12.6* 14.3*  HGB 15.1 15.0  HCT 45.0 44.3  MCV 94.1 94.3  PLT 242 [id number]   Basic Metabolic Panel  Recent Labs  02/05/16 0224 02/06/16 0451  NA 136 138  K 3.4* 3.7  CL 98* 101  CO2 29 28  GLUCOSE 148* 160*  BUN 19 18  CREATININE 0.77 0.84  CALCIUM 8.6* 8.8*   Liver Function Tests  Recent  Labs  02/03/16 1450  AST 63*  ALT 56  ALKPHOS 155*  BILITOT 1.5*  PROT 7.6  ALBUMIN 3.1*   Cardiac Enzymes  Recent Labs  02/03/16 1450 02/03/16 2344 02/04/16 0246  TROPONINI 5.55* 6.40* 6.43*   Hemoglobin A1C  Recent Labs  02/03/16 1450  HGBA1C 6.0*   Fasting Lipid Panel  Recent Labs  02/04/16 0246  CHOL 150  HDL 23*  LDLCALC 106*  TRIG 106  CHOLHDL 6.5   Thyroid Function Tests  Recent Labs  02/03/16 1450  TSH 3.131    Telemetry    NSR with occasional PVCs - Personally Reviewed  ECG    NSR with minimal J point depression in anterior leads. - Personally Reviewed  Radiology    No results found.  Cardiac Studies   Cath 02/04/2016 Conclusion     There is mild left ventricular systolic dysfunction.  LV end diastolic pressure is moderately elevated.  The left ventricular ejection fraction is 50-55% by visual estimate.  Mid RCA lesion, 70 %stenosed.  Dist RCA lesion, 60 %stenosed.  Prox Cx lesion, 100 %stenosed.  Ost LAD to  Prox LAD lesion, 70 %stenosed.  1st Diag lesion, 75 %stenosed.      Echo 02/05/2016 LV EF: 45% -   50%  - Left ventricle: The cavity size was normal. Wall thickness was   normal. Systolic function was mildly reduced. The estimated   ejection fraction was in the range of 45% to 50%. Features are   consistent with a pseudonormal left ventricular filling pattern,   with concomitant abnormal relaxation and increased filling   pressure (grade 2 diastolic dysfunction). - Left atrium: The atrium was mildly dilated. - Right ventricle: Systolic function was mildly reduced.  Patient Profile     66 y.o. male with a hx of COPD, OSA, prostate CA s/p prostatectomy presented on 02/03/2016 with NSTEMI and acute CHF. Cath 02/04/2016 100% occluded prox LCx, 60-70% RCA, 70% ost LAD, EF 50-55%. Occluded LCx felt to be completed infarct. Echo 02/05/2016 EF 45-50%.   Assessment & Plan    1. NSTEMI  - cath 02/04/2016 100% occluded prox LCx, 60-70% RCA, 70% ost LAD, EF 50-55%. Occluded LCx felt to be completed infarct.   - continue ASA, BB, lisinopril. Given elevated trop, consider initiate plavix therapy. HR slightly fast, unable to uptitrate BB due to BP  2. Acute systolic heart failure post MI  - still has trace ankle edema, will increase diuretic to 40mg  lasix this morning, then decrease to 20mg  daily thereafter  - ambulate today, if O2 sat stable with ambulation and no chest pain, likely discharge this afternoon. 7 day TOC appt with me in [place] office   3. COPD 4. OSA 5. Morbid obesity  Signed, Almyra Deforest, [place]  02/06/2016, 8:18 AM   Agree with note by Almyra Deforest PA-C  Looks great this AM. Agree with addition of Plavix and low dose diuretic. Exam benign. Labs OK. No CP/SOB. OK for DC home. TOC 7 then ROV with me 3-4 weeks  Lorretta Harp, M.D., [place], [facility], [place], [place] [address] [place], [place]  [address]  [phone] 02/06/2016 9:57  AM

## 2016-02-06 NOTE — Discharge Instructions (Signed)
Heart Attack A heart attack (myocardial infarction, MI) causes damage to your heart that cannot be fixed. A heart attack can happen when a heart (coronary) artery becomes blocked or narrowed. This cuts off the blood supply and oxygen to your heart. When one or more of your coronary arteries becomes blocked, that area of your heart begins to die. This causes the pain you feel during a heart attack. Heart attack pain can also occur in one part of the body but be felt in another part of the body (referred pain). You may feel referred heart attack pain in your left arm, neck, or jaw. Pain may even be felt in the right arm. What are the causes? Many conditions can cause a heart attack. These include:  Atherosclerosis. This is when a fatty substance (plaque) gradually builds up in the arteries. This buildup can block or reduce the blood supply to one or more of the heart arteries.  A blood clot. A blood clot can develop suddenly when plaque breaks up (ruptures) within a heart artery. A blood clot can block the heart artery, which prevents blood flow to the heart.  Severe tightening (spasm) of the heart artery. This cuts off blood flow through the artery. What increases the risk?   People at risk for heart attack usually have one or more of the following risk factors:  High blood pressure (hypertension).  High cholesterol.  Smoking.  Being male.  Being overweight or obese.  Older aged.  A family history of heart disease.  Lack of exercise.  Diabetes.  Stress.  Drinking too much alcohol.  Using illegal street drugs, such as cocaine and methamphetamines. What are the signs or symptoms? Heart attack symptoms can vary from person to person. Symptoms depend on factors like gender and age.  In both men and women, heart attack symptoms can include the following:  Chest pain. This may feel like crushing, squeezing, or a feeling of pressure.  Shortness of breath.  Heartburn or  indigestion with or without vomiting, shortness of breath, or sweating.  Sudden cold sweats.  Sudden light-headedness.  Upper back pain.  Women can have unique heart attack symptoms, such as:  Unexplained feelings of nervousness or anxiety.  Discomfort between the shoulder blades or upper back.  Tingling in the hands and arms.  Older people (of both genders) can have subtle heart attack symptoms, such as:  Sweating.  Shortness of breath.  General tiredness or not feeling well. How is this diagnosed? Diagnosing a heart attack involves several tests. They include:  An assessment of your vital signs. This includes checking your:  Heart rhythm.  Blood pressure.  Breathing rate.  Oxygen level.  An ECG (electrocardiogram) to measure the electrical activity of your heart.  Blood tests called cardiac markers. In these tests, blood is drawn at scheduled times to check for the specific proteins or enzymes released by damaged heart muscle.  A chest X-ray.  An echocardiogram to evaluate heart motion and blood flow.  Coronary angiography to look at the heart arteries. How is this treated? Treatment for a heart attack may include:  Medicine that breaks up or dissolves blood clots in the heart artery.  Angioplasty.  Cardiac stent placement.  Intra-aortic balloon pump therapy (IABP).  Open heart surgery (coronary artery bypass graft, CABG). Follow these instructions at home:  Take medicines only as directed by your health care provider. You may need to take medicine after a heart attack to:  Keep your blood from clotting  too easily.  Control your blood pressure.  Lower your cholesterol.  Control abnormal heart rhythms.  Do not take the following medicines unless your health care provider approves:  Nonsteroidal anti-inflammatory drugs (NSAIDs), such as ibuprofen, naproxen, or celecoxib.  Vitamin supplements that contain vitamin A, vitamin E, or  both.  Hormone replacement therapy that contains estrogen with or without progestin.  Make lifestyle changes as directed by your health care provider. These may include:  Quitting smoking, if you smoke.  Getting regular exercise. Ask your health care provider to suggest some activities that are safe for you.  Eating a heart-healthy diet. A registered dietitian can help you learn healthy eating options.  Maintaining a healthy weight.  Managing diabetes, if necessary.  Reducing stress.  Limiting how much alcohol you drink. Get help right away if:  You have sudden, unexplained chest discomfort.  You have sudden, unexplained discomfort in your arms, back, neck, or jaw.  You have shortness of breath at any time.  You suddenly start to sweat or your skin gets clammy.  You feel nauseous or vomit.  You suddenly feel light-headed or dizzy.  Your heart begins to beat fast or feels like it is skipping beats. These symptoms may represent a serious problem that is an emergency. Do not wait to see if the symptoms will go away. Get medical help right away. Call your local emergency services (911 in the [place] Do not drive yourself to the hospital.  This information is not intended to replace advice given to you by your health care provider. Make sure you discuss any questions you have with your health care provider. Document Released: 01/06/2005 Document Revised: 06/14/2015 Document Reviewed: 03/11/2013 Elsevier Interactive Patient Education  2017 Elsevier Inc.   Heart Disease Prevention Heart disease is a leading cause of death. There are many things you can do to help prevent heart disease. Be physically active Physical activity is good for your heart. It helps control your blood pressure, cholesterol levels, and weight. Try to be physically active every day. Ask your health care provider what activities are best for you. Be a healthy weight Extra weight can strain your heart and  affect your blood pressure and cholesterol levels. Lose weight with diet and exercise if recommended by your health care provider. Eat heart-healthy foods Follow a healthy eating plan as recommended by your health care provider or dietitian. Heart-healthy foods include:  High-fiber foods. These include oat bran, oatmeal, and whole-grain breads and cereals.  Fruits and vegetables. Avoid:  Alcohol.  Fried foods.  Foods high in saturated fat. These include meats, butter, whole dairy products, shortening, and coconut or palm oil.  Salty foods. These include canned food, luncheon meat, salty snacks, and fast food. Keep your cholesterol levels under control Cholesterol is a substance that is used for many important functions. When your cholesterol levels are high, cholesterol can stick to the insides of your blood vessels, making them narrow or clog. This can lead to chest pain (angina) and a heart attack. Keep your cholesterol levels under control as recommended by your health care provider. Have your cholesterol checked at least once a year. Target cholesterol levels (in mg/dL) for most people are:  Total cholesterol below 200.  LDL cholesterol below 100.  HDL cholesterol above 40 in men and above 50 in women.  Triglycerides below 150. Keep your blood pressure under control Having high blood pressure (hypertension) puts you at risk for stroke and other forms of heart disease. Keep your  blood pressure under control as recommended by your health care provider. Ask your health care provider if you need treatment to lower your blood pressure. If you are 70-31 years of age, have your blood pressure checked every 3-5 years. If you are 78 years of age or older, have your blood pressure checked every year. Do not use tobacco products Tobacco smoke can damage your heart and blood vessels. Do not use any tobacco products including cigarettes, chewing tobacco, or electronic cigarettes. If you need  help quitting, ask your health care provider. Take medicines as directed Take medicines only as directed by your health care provider. Ask your health care provider whether you should take an aspirin every day. Taking aspirin can help reduce your risk of heart disease and stroke. Where to find more information: To find out more about heart disease, visit the American Heart Association's website at www.americanheart.org This information is not intended to replace advice given to you by your health care provider. Make sure you discuss any questions you have with your health care provider. Document Released: 08/21/2003 Document Revised: 06/06/2015 Document Reviewed: 03/02/2013 Elsevier Interactive Patient Education  2017 [employer].

## 2016-02-06 NOTE — Progress Notes (Signed)
CARDIAC REHAB PHASE I   Pt ambulated with nursing staff, no complaints other than mild DOE, declined additional ambulation at this time.  Completed MI education with pt and wife at bedside.  Reviewed risk factors, tobacco cessation, MI book, anti-platelet therapy, activity restrictions, ntg, exercise, heart healthy diet, carb counting, portion control, sodium restrictions, s/s heart failure, daily weights, and phase 2 cardiac rehab. Pt verbalized understanding, receptive to education. Pt agrees to phase 2 cardiac rehab referral, will send to [facility] per pt request. Pt sitting on edge of bed, call bell within reach.  Ross, RN, BSN 02/06/2016 11:41 AM

## 2016-02-06 NOTE — Progress Notes (Signed)
Patient ambulated in hallway independently without any complications, maintaining oxygen saturations 90% and above.  Reviewed discharge instructions with patient and family and they stated their understanding. Reviewed radial site care, how to use SL NTG, when to call MD, and Heart Failure education with patient.  Discharged home with family via wheelchair. Sanda Linger

## 2016-02-21 ENCOUNTER — Encounter: Payer: Self-pay | Admitting: Pulmonary Disease

## 2016-02-25 ENCOUNTER — Ambulatory Visit (INDEPENDENT_AMBULATORY_CARE_PROVIDER_SITE_OTHER): Payer: Medicare Other | Admitting: Pulmonary Disease

## 2016-02-25 ENCOUNTER — Encounter: Payer: Self-pay | Admitting: Pulmonary Disease

## 2016-02-25 DIAGNOSIS — J430 Unilateral pulmonary emphysema [MacLeod's syndrome]: Secondary | ICD-10-CM

## 2016-02-25 DIAGNOSIS — Z23 Encounter for immunization: Secondary | ICD-10-CM | POA: Diagnosis not present

## 2016-02-25 DIAGNOSIS — I214 Non-ST elevation (NSTEMI) myocardial infarction: Secondary | ICD-10-CM | POA: Diagnosis not present

## 2016-02-25 DIAGNOSIS — G4733 Obstructive sleep apnea (adult) (pediatric): Secondary | ICD-10-CM | POA: Diagnosis not present

## 2016-02-25 NOTE — Assessment & Plan Note (Signed)
Recent NSTEMI (01/2016) Currently on Lasix. He has lost 20 pounds since being on Lasix. Follow-up with cardiology.

## 2016-02-25 NOTE — Assessment & Plan Note (Addendum)
45PY smoking history, quit in 02/2015. Wife still smokes. PFT (04/2015)  FEV1  2.30  56%.  DLCO 66% CXR (02/2015)  RVD.   1. Cont  symbicort 160 2P BID. 2. Cont ventolin prn. 3. Mentioned regarding pulm rehab > he wants to hold off 4. He quit smoking Jan 14.  Need to ask him on f/u whether he remains abstinent.  5. PNA 23 in 02/2015. Prevnar 13 in 02/2016

## 2016-02-25 NOTE — Progress Notes (Signed)
Subjective:    Patient ID: John Griffin, male    DOB: 22-Sep-1950, 66 y.o.   MRN: [id number]  HPI  This is the case of John Griffin, 66 y.o. Male, who was referred by Dr. Sloan Leiter / [facility] in consultation regarding possible copd.  As you very well know, patient has a 45-pack-year smoking history, quit recently. Wife still smokes. . Patient had prostate surgery in July 2016. He had prostate cancer. They remove it. In September 2016, he had a bout of pneumonia. He had a chest x-ray over at [facility] family [facility]. He had 2 rounds of Z-Pak. He got better after that. He had another bout of pneumonia in November 2016. He took levofloxacin for a week. He got better. He was doing fine until 02/23/2015.  He ended up being admitted at [facility] for a week. He was treated as an acute exacerbation of COPD, possible bacterial pneumonia. He improved. He followed up with his regular doctor last week. He was told he had "infection in his blood" and was given another round of Levaquin.  He feels well overall. He has been on Symbicort 2 puffs twice a day and Dulera 2puffs twice a day. Uses albuterol 2 puffs every 4 hours as needed, has not used it in 3 days. He finished prednisone on February 15.  Not been diagnosed with sleep apnea. Wife who has sleep apnea denies seeing patient having apneic episodes. He has some abnormal breathing at night though. Occasional snoring. No gasping or choking. Has frequent awakening since having a prostate surgery. Denies hypersomnia. No abnormal behavior and sleep.  ROV 05/09/15 Pt f/u on copd. Has been doing fair since last seen. Has not beed admitted nor been on abx since last seen. SOB better.     ROV 10/24/15 Pt returns to office as f/u on his copd and osa. Since last seen, he had a HST in 06/2015 which showed severe osa.  He is using autocpap. Feels better using it. No issues except for heated humidity > too warm. Uses symbicort for copd. Feels better using  it. Has not been admitted nor has been on abx since last seen.   ROV 02/25/2016 Patient returns to the office as follow-up on his COPD and sleep apnea. Since last seen, he had NSTEMI middle of January for which he was admitted for several days. He was also started on Lasix and has lost 20 pounds since last visit.  His breathing is significantly improved as well. He is using his CPAP machine, feels better using it. Download the last month: 53%, AHI 2. Compliance was slow because he was congested and was not using it and this is the time that he was admitted for his heart attack.   Review of Systems  Constitutional: Negative.  Negative for fever and unexpected weight change.       No weight gain in one year.  HENT: Negative.  Negative for congestion, dental problem, ear pain, nosebleeds, postnasal drip, rhinorrhea, sinus pressure, sneezing, sore throat and trouble swallowing.   Eyes: Negative.  Negative for redness and itching.  Respiratory: Positive for shortness of breath. Negative for cough, chest tightness and wheezing.   Cardiovascular: Negative.  Negative for palpitations and leg swelling.  Gastrointestinal: Negative.  Negative for nausea and vomiting.  Endocrine: Negative.   Genitourinary: Negative.  Negative for dysuria.  Musculoskeletal: Negative for joint swelling.  Skin: Negative for rash.  Allergic/Immunologic: Negative.   Neurological: Negative.  Negative for headaches.  Hematological: Does  not bruise/bleed easily.  Psychiatric/Behavioral: Negative.  Negative for dysphoric mood. The patient is not nervous/anxious.   All other systems reviewed and are negative.    Objective:   Physical Exam  Vitals:  Vitals:   02/25/16 0839  BP: 114/68  Pulse: 81  SpO2: 93%  Weight: (!) 319 lb 14.4 oz (145.1 kg)  Height: 6\' 4"  (1.93 m)    Constitutional/General:  Pleasant, well-nourished, well-developed, not in any distress,  Comfortably seating.  Well kempt obese  Body mass index is  38.94 kg/m. Wt Readings from Last 3 Encounters:  02/25/16 (!) 319 lb 14.4 oz (145.1 kg)  02/06/16 (!) 331 lb 12.8 oz (150.5 kg)  10/24/15 (!) 334 lb 6.4 oz (151.7 kg)    Neck circumference: 19.5 inches  HEENT: Pupils equal and reactive to light and accommodation. Anicteric sclerae. Normal nasal mucosa.   No oral  lesions,  mouth clear,  oropharynx clear, no postnasal drip. (-) Oral thrush. No dental caries.  Airway - Mallampati class III-IV  Neck: No masses. Midline trachea. No JVD, (-) LAD. (-) bruits appreciated.  Respiratory/Chest: Grossly normal chest. (-) deformity. (-) Accessory muscle use.  Symmetric expansion. (-) Tenderness on palpation.  Resonant on percussion.  Diminished BS on both lower lung zones. (-) wheezing,  Rhonchi. Crackles at the bottom (-) egophony  Cardiovascular: Regular rate and  rhythm, heart sounds normal, no murmur or gallops, trace peripheral edema  Gastrointestinal:  Normal bowel sounds. Soft, non-tender. No hepatosplenomegaly.  (-) masses.   Musculoskeletal:  Normal muscle tone. Normal gait.   Extremities: Grossly normal. (-) clubbing, cyanosis.  trace edema  Skin: (-) rash,lesions seen.   Neurological/Psychiatric : alert, oriented to time, place, person. Normal mood and affect       Assessment & Plan:  COPD (chronic obstructive pulmonary disease) ([place]) 45PY smoking history, quit in 02/2015. Wife still smokes. PFT (04/2015)  FEV1  2.30  56%.  DLCO 66% CXR (02/2015)  RVD.   1. Cont  symbicort 160 2P BID. 2. Cont ventolin prn. 3. Mentioned regarding pulm rehab > he wants to hold off 4. He quit smoking Jan 14.  Need to ask him on f/u whether he remains abstinent.  5. PNA 23 in 02/2015. Prevnar 13 in 02/2016    OSA (obstructive sleep apnea) Has hypersomnia, snoring. Wife has osa and uses cpap. HST (06/2015)  AHI 44.  On autocpap 5-15 cm water.  Feels better using CPAP. More energy. Less sleepiness. He had issues with the mask and  humidity  but those issues are resolved now. DL last month : 53% (as he was admitted and had sinus congestion), AHI 2.  Using it everyday now.  Obesity Weight reduction  NSTEMI (non-ST elevation myocardial infarction) ([place]) Recent NSTEMI (01/2016) Currently on Lasix. He has lost 20 pounds since being on Lasix. Follow-up with cardiology.   Return to clinic in 6 months.         Monica Becton, MD Pulmonary and [place] Pager: [phone] Office: [phone], Fax: [phone]

## 2016-02-25 NOTE — Assessment & Plan Note (Signed)
Weight reduction 

## 2016-02-25 NOTE — Patient Instructions (Signed)
It was a pleasure taking care of you today!  You are diagnosed with Chronic Obstructive Pulmonary Disease or COPD.  COPD is a preventable and treatable disease that makes it difficult to empty air out of the lungs (airflow obstruction).  This can lead to shortness of breath.   Sometimes, when you have a lung infection, this can make your breathing worse, and will cause you to have a COPD flare-up or an acute exacerbation of COPD. Please call your primary care doctor or the office if you are having a COPD flare-up.   Smoking makes COPD worse.   Make sure you use your medications for COPD -- Maintenance medications : Symbicort 2 puffs, 2x/day  Rescue medications: Albuterol 2 puffs every 4 hours as needed for shortness of breath.   Please rinse your mouth each time you use your maintenance medication.  Please call the office if you are having issues with your medications  Continue using your CPAP machine.   Please make sure you use your CPAP device everytime you sleep.  We will monitor the usage of your machine per your insurance requirement.  Your insurance company may take the machine from you if you are not using it regularly.   Please clean the mask, tubings, filter, water reservoir with soapy water every week.  Please use distilled water for the water reservoir.   Please call the office or your machine provider (DME company) if you are having issues with the device.   Return to clinic in 6 mos  with Dr. Corrie Dandy or  NP/APP

## 2016-02-25 NOTE — Addendum Note (Signed)
Addended by: [name] Setting L on: 02/25/2016 09:42 AM   Modules accepted: Orders

## 2016-02-25 NOTE — Assessment & Plan Note (Signed)
Has hypersomnia, snoring. Wife has osa and uses cpap. HST (06/2015)  AHI 44.  On autocpap 5-15 cm water.  Feels better using CPAP. More energy. Less sleepiness. He had issues with the mask and humidity  but those issues are resolved now. DL last month : 53% (as he was admitted and had sinus congestion), AHI 2.  Using it everyday now.

## 2016-02-29 ENCOUNTER — Ambulatory Visit (INDEPENDENT_AMBULATORY_CARE_PROVIDER_SITE_OTHER): Payer: Medicare Other | Admitting: Cardiovascular Disease

## 2016-02-29 ENCOUNTER — Encounter: Payer: Self-pay | Admitting: Cardiovascular Disease

## 2016-02-29 VITALS — BP 123/70 | HR 83 | Ht 75.0 in | Wt 325.4 lb

## 2016-02-29 DIAGNOSIS — I214 Non-ST elevation (NSTEMI) myocardial infarction: Secondary | ICD-10-CM

## 2016-02-29 DIAGNOSIS — Z720 Tobacco use: Secondary | ICD-10-CM | POA: Diagnosis not present

## 2016-02-29 DIAGNOSIS — E7800 Pure hypercholesterolemia, unspecified: Secondary | ICD-10-CM | POA: Diagnosis not present

## 2016-02-29 DIAGNOSIS — E785 Hyperlipidemia, unspecified: Secondary | ICD-10-CM

## 2016-02-29 NOTE — Assessment & Plan Note (Signed)
History of hypertension blood pressure measured 123/70. He is on Zebeta. Continue current meds at current dosing

## 2016-02-29 NOTE — Patient Instructions (Signed)
Medication Instructions: Your physician recommends that you continue on your current medications as directed. Please refer to the Current Medication list given to you today.  Labwork: Your physician recommends that you return for a FASTING lipid profile and hepatic function panel in 2 months.   Follow-Up: We request that you follow-up in: 6 months with an extender and in 12 months with Dr Andria Rhein will receive a reminder letter in the mail two months in advance. If you don't receive a letter, please call our office to schedule the follow-up appointment.  If you need a refill on your cardiac medications before your next appointment, please call your pharmacy.

## 2016-02-29 NOTE — Assessment & Plan Note (Signed)
History of recent non-STEMI admitted 02/03/16.The symptoms began early 28s prior to that. I performed a radial catheterization on him 02/04/16 feeling an occluded nondominant circumflex, 70% proximal LAD and mid RCA with EF of 55% and inferoapical hypokinesia. He's had no recurrent symptoms. I decided not to intervene on the circumflex. We'll continue to treat him medically.

## 2016-02-29 NOTE — Assessment & Plan Note (Signed)
History of 40-pack-years of tobacco abuse having recently discontinued at the time of his myocardial infarction.

## 2016-02-29 NOTE — Progress Notes (Signed)
02/29/2016 [name]   1950/11/05  [id number]  Primary Physician Stephens Shire, MD Primary Cardiologist: Lorretta Harp MD Renae Gloss  HPI:  Mr. Bohlander is a 66 year old mildly overweight married Caucasian male father 2, grandfather to 3 grandchildren whose wife is also patient in mind. He is retired Geophysical data processor. He has a history of discontinued tobacco abuse of 45 pack years and COPD, treated hypertension and hyperlipidemia. He had non-STEMI 6 days prior to admission on 02/03/16 with post-MI angina. I catheterized him on 02/04/16 revealing 100 nondominant circumflex, 70% proximal LAD and mid dominant RCA and EF of 5055%. There was inferoapical hypokinesia. I elected not to intervene because of the subacute nature of the occlusion. He's been asymptomatic since discharge.   Current Outpatient Prescriptions  Medication Sig Dispense Refill  . acetaminophen (TYLENOL) 500 MG tablet Take 1,000 mg by mouth every 6 (six) hours as needed for mild pain. Reported on 05/09/2015    . albuterol (PROVENTIL HFA;VENTOLIN HFA) 108 (90 Base) MCG/ACT inhaler Inhale 2 puffs into the lungs every 4 (four) hours as needed for wheezing or shortness of breath. 18 g 0  . aspirin 81 MG chewable tablet Chew 1 tablet (81 mg total) by mouth daily.    Marland Kitchen atorvastatin (LIPITOR) 80 MG tablet Take 1 tablet (80 mg total) by mouth daily at 6 PM. 30 tablet 6  . bisoprolol (ZEBETA) 5 MG tablet Take 1 tablet (5 mg total) by mouth daily. 30 tablet 6  . budesonide-formoterol (SYMBICORT) 160-4.5 MCG/ACT inhaler Inhale 2 puffs into the lungs 2 (two) times daily. 1 Inhaler 5  . calcium carbonate (TUMS EX) 750 MG chewable tablet Chew 1,500 mg by mouth as needed for heartburn.     . cetirizine (ZYRTEC) 10 MG tablet Take 10 mg by mouth daily. Reported on 03/14/2015    . clopidogrel (PLAVIX) 75 MG tablet Take 1 tablet (75 mg total) by mouth daily. 30 tablet 6  . fluticasone (FLONASE) 50 MCG/ACT nasal spray Place 1  spray into both nostrils daily.     . furosemide (LASIX) 20 MG tablet Take 1 tablet (20 mg total) by mouth daily. 30 tablet 6  . isosorbide mononitrate (IMDUR) 30 MG 24 hr tablet Take 1 tablet (30 mg total) by mouth daily. 30 tablet 6  . lisinopril (PRINIVIL,ZESTRIL) 5 MG tablet Take 1 tablet (5 mg total) by mouth daily. 30 tablet 6  . nitroGLYCERIN (NITROSTAT) 0.4 MG SL tablet Place 1 tablet (0.4 mg total) under the tongue every 5 (five) minutes x 3 doses as needed for chest pain. 25 tablet 2  . omeprazole (PRILOSEC) 20 MG capsule Take 40 mg by mouth daily.    . potassium chloride SA (K-DUR,KLOR-CON) 10 MEQ tablet Take 1 tablet (10 mEq total) by mouth daily. 30 tablet 6   No current facility-administered medications for this visit.     No Known Allergies  Social History   Social History  . Marital status: Married    Spouse name: N/A  . Number of children: N/A  . Years of education: N/A   Occupational History  . Not on file.   Social History Main Topics  . Smoking status: Former Smoker    Packs/day: 1.00    Years: 40.00    Types: Cigarettes    Quit date: 02/03/2016  . Smokeless tobacco: Never Used  . Alcohol use 0.0 oz/week     Comment: occasional   . Drug use: No  . Sexual  activity: Not on file   Other Topics Concern  . 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 [name] with [facility]     Review of Systems: General: negative for chills, fever, night sweats or weight changes.  Cardiovascular: negative for chest pain, dyspnea on exertion, edema, orthopnea, palpitations, paroxysmal nocturnal dyspnea or shortness of breath Dermatological: negative for rash Respiratory: negative for cough or wheezing Urologic: negative for hematuria Abdominal: negative for nausea, vomiting, diarrhea, bright red blood per rectum, melena, or hematemesis Neurologic: negative for visual  changes, syncope, or dizziness All other systems reviewed and are otherwise negative except as noted above.    Blood pressure 123/70, pulse 83, height 6\' 3"  (1.905 m), weight (!) 325 lb 6.4 oz (147.6 kg), SpO2 93 %.  General appearance: alert and no distress Neck: no adenopathy, no carotid bruit, no JVD, supple, symmetrical, trachea midline and thyroid not enlarged, symmetric, no tenderness/mass/nodules Lungs: clear to auscultation bilaterally Heart: regular rate and rhythm, S1, S2 normal, no murmur, click, rub or gallop Extremities: extremities normal, atraumatic, no cyanosis or edema  EKG not performed today  ASSESSMENT AND PLAN:   Tobacco use History of 40-pack-years of tobacco abuse having recently discontinued at the time of his myocardial infarction.  Essential hypertension History of hypertension blood pressure measured 123/70. He is on Zebeta. Continue current meds at current dosing  Hyperlipidemia History of hyperlipidemia with recent lipid profile performed 02/04/16 revealing LDL 106. He was recently put on atorvastatin 80 mg a day. We will recheck a lipid and liver profile in 2 months.  NSTEMI (non-ST elevation myocardial infarction) [facility]) History of recent non-STEMI admitted 02/03/16.The symptoms began early 92s prior to that. I performed a radial catheterization on him 02/04/16 feeling an occluded nondominant circumflex, 70% proximal LAD and mid RCA with EF of 55% and inferoapical hypokinesia. He's had no recurrent symptoms. I decided not to intervene on the circumflex. We'll continue to treat him medically.      Lorretta Harp MD FACP,FACC,FAHA, [facility] 02/29/2016 3:39 PM

## 2016-02-29 NOTE — Assessment & Plan Note (Signed)
History of hyperlipidemia with recent lipid profile performed 02/04/16 revealing LDL 106. He was recently put on atorvastatin 80 mg a day. We will recheck a lipid and liver profile in 2 months.

## 2016-03-21 ENCOUNTER — Telehealth (HOSPITAL_COMMUNITY): Payer: Self-pay | Admitting: Family Medicine

## 2016-03-21 NOTE — Telephone Encounter (Signed)
Verified Medicare A/B and [employer]  insurance benefits through Passport Reference # [id number] &  Medicare [phone].... KJ

## 2016-04-01 ENCOUNTER — Telehealth (HOSPITAL_COMMUNITY): Payer: Self-pay | Admitting: *Deleted

## 2016-04-01 NOTE — Telephone Encounter (Signed)
Called and spoke to pt regarding attending cardiac rehab.  Pt indicated on prior telephone call that he would like to speak with dr. [name].  Pt indicated that he was doing fine on his own with exercise and he was also losing weight. Pt declined to attend cardiac rehab. Cherre Huger, BSN

## 2016-04-04 ENCOUNTER — Telehealth: Payer: Self-pay | Admitting: Pulmonary Disease

## 2016-04-04 NOTE — Telephone Encounter (Signed)
Received a fax from [employer] that states pt's symbicort was not on his covered formulary. Reached out to the pt to inform him of this and to reach out to his insurance to find out what is covered and then contact us back. Will await his call back

## 2016-04-30 DIAGNOSIS — C61 Malignant neoplasm of prostate: Secondary | ICD-10-CM | POA: Diagnosis not present

## 2016-04-30 DIAGNOSIS — E785 Hyperlipidemia, unspecified: Secondary | ICD-10-CM | POA: Diagnosis not present

## 2016-04-30 LAB — LIPID PANEL
Cholesterol: 100 mg/dL (ref <200)
HDL: 26 mg/dL — AB (ref >40)
LDL CALC: 56 mg/dL (ref <100)
TRIGLYCERIDES: 89 mg/dL (ref <150)
Total CHOL/HDL Ratio: 3.8 Ratio (ref <5.0)
VLDL: 18 mg/dL (ref <30)

## 2016-04-30 LAB — HEPATIC FUNCTION PANEL
ALBUMIN: 3.6 g/dL (ref 3.6–5.1)
ALT: 38 U/L (ref 9–46)
AST: 31 U/L (ref 10–35)
Alkaline Phosphatase: 173 U/L — ABNORMAL HIGH (ref 40–115)
BILIRUBIN INDIRECT: 0.6 mg/dL (ref 0.2–1.2)
Bilirubin, Direct: 0.2 mg/dL (ref <=0.2)
TOTAL PROTEIN: 6.7 g/dL (ref 6.1–8.1)
Total Bilirubin: 0.8 mg/dL (ref 0.2–1.2)

## 2016-05-06 ENCOUNTER — Telehealth: Payer: Self-pay | Admitting: Cardiovascular Disease

## 2016-05-06 ENCOUNTER — Other Ambulatory Visit: Payer: Self-pay | Admitting: Cardiovascular Disease

## 2016-05-06 DIAGNOSIS — E7800 Pure hypercholesterolemia, unspecified: Secondary | ICD-10-CM

## 2016-05-06 NOTE — Telephone Encounter (Signed)
Lipid panel  Order: [id number]  Status:  Final result Visible to patient:  Yes (MyChart) Dx:  Hyperlipidemia, unspecified hyperlipi...  Notes recorded by Therisa Doyne on 05/06/2016 at 2:42 PM EDT Results given to pt. Pt verbalized understanding. Repeat order entered. Lab slip mailed to pt to recheck LFTs in 2 months. ------  Notes recorded by Lorretta Harp, MD on 04/30/2016 at 3:29 PM EDT Fasting lipid profile excellent. Repeat LFTs in 2 months.

## 2016-05-07 DIAGNOSIS — N5201 Erectile dysfunction due to arterial insufficiency: Secondary | ICD-10-CM | POA: Diagnosis not present

## 2016-05-07 DIAGNOSIS — C61 Malignant neoplasm of prostate: Secondary | ICD-10-CM | POA: Diagnosis not present

## 2016-05-07 DIAGNOSIS — N393 Stress incontinence (female) (male): Secondary | ICD-10-CM | POA: Diagnosis not present

## 2016-05-30 ENCOUNTER — Other Ambulatory Visit: Payer: Self-pay

## 2016-05-30 MED ORDER — BUDESONIDE-FORMOTEROL FUMARATE 160-4.5 MCG/ACT IN AERO: 2.0000 | INHALATION_SPRAY | Freq: Two times a day (BID) | RESPIRATORY_TRACT | 5 refills | Status: DC

## 2016-06-12 DIAGNOSIS — Z0000 Encounter for general adult medical examination without abnormal findings: Secondary | ICD-10-CM | POA: Diagnosis not present

## 2016-07-10 ENCOUNTER — Other Ambulatory Visit: Payer: Self-pay | Admitting: Cardiovascular Disease

## 2016-07-10 DIAGNOSIS — E7800 Pure hypercholesterolemia, unspecified: Secondary | ICD-10-CM | POA: Diagnosis not present

## 2016-07-10 LAB — HEPATIC FUNCTION PANEL
ALBUMIN: 3.7 g/dL (ref 3.6–5.1)
ALK PHOS: 154 U/L — AB (ref 40–115)
ALT: 43 U/L (ref 9–46)
AST: 39 U/L — AB (ref 10–35)
Bilirubin, Direct: 0.2 mg/dL (ref <=0.2)
Indirect Bilirubin: 0.7 mg/dL (ref 0.2–1.2)
Total Bilirubin: 0.9 mg/dL (ref 0.2–1.2)
Total Protein: 6.7 g/dL (ref 6.1–8.1)

## 2016-07-17 DIAGNOSIS — R748 Abnormal levels of other serum enzymes: Secondary | ICD-10-CM | POA: Diagnosis not present

## 2016-08-14 DIAGNOSIS — Z1211 Encounter for screening for malignant neoplasm of colon: Secondary | ICD-10-CM | POA: Diagnosis not present

## 2016-08-14 DIAGNOSIS — K649 Unspecified hemorrhoids: Secondary | ICD-10-CM | POA: Diagnosis not present

## 2016-08-17 ENCOUNTER — Encounter: Payer: Self-pay | Admitting: Student

## 2016-08-17 NOTE — Progress Notes (Signed)
Cardiology Office Note    Date:  08/19/2016   ID:  John Griffin, DOB [birth year], MRN [id number]  PCP:  John Sacramento, MD  Cardiologist: Dr. [name]    Chief Complaint  Patient presents with  . Follow-up    5 months; Denies any new symptoms    History of Present Illness:    John Griffin is a 66 y.o. male with past medical history of CAD (s/p NSTEMI in 01/2016 with cath showing 100% Prox Cx stenosis, 70% Prox LAD, 75% 1st Diag, and 60-70% RCA stenosis with medical therapy pursued as the LCx had collateral flow noted), HTN, HLD, COPD and OSA (on CPAP) who presents to the office today for 56-monthfollow-up.  He was last examined by Dr. BGwenlyn Foundin 02/2016 and reported doing well from a cardiac perspective at that time. He was continued on his current medication regimen of ASA, Plavix, BB, statin, and Imdur therapy.    In talking with the patient today, he reports "feeling great". He denies any recent episodes of chest discomfort or dyspnea on exertion since his recent MI in 01/2016. He is not overly active at baseline but is able to perform household chores and yard work without any anginal symptoms. He denies any recent orthopnea, PND, lower extremity edema, or palpitations. Uses CPAP nightly.   He reports good compliance with his medication regimen. He checks his BP regularly at home with SBP usually in the 110's to 120's. He has lost over 30 lbs since his MI and over 14 lbs since his last office visit secondary to dietary changes. Unfortunately, he has resumed smoking and is smoking approximately 1 ppd.    Past Medical History:  Diagnosis Date  . Arthritis    hands   . CAD (coronary artery disease)    a. 01/2016: NSTEMI with cath showing 100% Prox Cx stenosis, 70% Prox LAD, 75% 1st Diag, and 60-70% RCA stenosis with medical therapy pursued as the LCx had collateral flow noted  . [place]   . Prostate cancer ([facility]    s/p prostatectomy  .  Tobacco use   . Umbilical hernia   . Urinary frequency     Past Surgical History:  Procedure Laterality Date  . bone graft right ankle   1972  . CARDIAC CATHETERIZATION N/A 02/04/2016   Procedure: Left Heart Cath and Coronary Angiography;  Surgeon: JLorretta Harp MD;  [facility];  Service: Cardiovascular;  Laterality: N/A;  . LYMPHADENECTOMY Bilateral 08/17/2014   Procedure: LYMPHADENECTOMY;  Surgeon: LRaynelle Bring MD;  Location: [facility];  Service: Urology;  Laterality: Bilateral;  . right shoulder surgery   1992   . ROBOT ASSISTED LAPAROSCOPIC RADICAL PROSTATECTOMY N/A 08/17/2014   Procedure: ROBOTIC ASSISTED LAPAROSCOPIC RADICAL PROSTATECTOMY LEVEL 3  AND UMBILICAL HERNIA REPAIR;  Surgeon: LRaynelle Bring MD;  Location: [facility];  Service: Urology;  Laterality: N/A;    Current Medications: Outpatient Medications Prior to Visit  Medication Sig Dispense Refill  . acetaminophen (TYLENOL) 500 MG tablet Take 1,000 mg by mouth every 6 (six) hours as needed for mild pain. Reported on 05/09/2015    . albuterol (PROVENTIL HFA;VENTOLIN HFA) 108 (90 Base) MCG/ACT inhaler Inhale 2 puffs into the lungs every 4 (four) hours as needed for wheezing or shortness of breath. 18 g 0  . aspirin 81 MG chewable tablet Chew 1 tablet (81 mg total) by mouth daily.    . budesonide-formoterol (SYMBICORT) 160-4.5 MCG/ACT inhaler Inhale 2  puffs into the lungs 2 (two) times daily. 1 Inhaler 5  . calcium carbonate (TUMS EX) 750 MG chewable tablet Chew 1,500 mg by mouth as needed for heartburn.     . cetirizine (ZYRTEC) 10 MG tablet Take 10 mg by mouth daily. Reported on 03/14/2015    . fluticasone (FLONASE) 50 MCG/ACT nasal spray Place 1 spray into both nostrils daily.     . nitroGLYCERIN (NITROSTAT) 0.4 MG SL tablet Place 1 tablet (0.4 mg total) under the tongue every 5 (five) minutes x 3 doses as needed for chest pain. 25 tablet 2  . potassium chloride SA (K-DUR,KLOR-CON) 10 MEQ tablet Take 1 tablet (10  mEq total) by mouth daily. 30 tablet 6  . atorvastatin (LIPITOR) 80 MG tablet Take 1 tablet (80 mg total) by mouth daily at 6 PM. 30 tablet 6  . bisoprolol (ZEBETA) 5 MG tablet Take 1 tablet (5 mg total) by mouth daily. 30 tablet 6  . clopidogrel (PLAVIX) 75 MG tablet Take 1 tablet (75 mg total) by mouth daily. 30 tablet 6  . furosemide (LASIX) 20 MG tablet Take 1 tablet (20 mg total) by mouth daily. 30 tablet 6  . isosorbide mononitrate (IMDUR) 30 MG 24 hr tablet Take 1 tablet (30 mg total) by mouth daily. 30 tablet 6  . lisinopril (PRINIVIL,ZESTRIL) 5 MG tablet Take 1 tablet (5 mg total) by mouth daily. 30 tablet 6  . omeprazole (PRILOSEC) 20 MG capsule Take 40 mg by mouth daily.     No facility-administered medications prior to visit.      Allergies:   Patient has no known allergies.   Social History   Social History  . Marital status: Married    Spouse name: N/A  . Number of children: N/A  . Years of education: N/A   Social History Main Topics  . Smoking status: Former Smoker    Packs/day: 1.00    Years: 40.00    Types: Cigarettes    Quit date: 02/03/2016  . Smokeless tobacco: Never Used  . Alcohol use 0.0 oz/week     Comment: occasional   . Drug use: No  . Sexual activity: Not Asked   Other Topics Concern  . 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 [name] with [facility]     Family History:  The patient's family history includes CAD in his father; Dementia in his mother; Heart attack (age of onset: 81) in his father.   Review of Systems:   Please see the history of present illness.     General:  No chills, fever, night sweats or weight changes.  Cardiovascular:  No chest pain, dyspnea on exertion, edema, orthopnea, palpitations, paroxysmal nocturnal dyspnea. Dermatological: No rash, lesions/masses Respiratory: No cough, dyspnea Urologic: No hematuria,  dysuria Abdominal:   No nausea, vomiting, diarrhea, melena, or hematemesis. Positive for occasional hemorrhoids.  Neurologic:  No visual changes, wkns, changes in mental status.  All other systems reviewed and are otherwise negative except as noted above.   Physical Exam:    VS:  BP 112/76   Pulse 73   Ht 6' 4"  (1.93 m)   Wt (!) 311 lb 12.8 oz (141.4 kg)   BMI 37.95 kg/m    General: Well developed, overweight Caucasian male appearing in no acute distress. Head: Normocephalic, atraumatic, sclera non-icteric, no xanthomas, nares are without discharge.  Neck: No  carotid bruits. JVD not elevated.  Lungs: Respirations regular and unlabored, without wheezes or rales.  Heart: Regular rate and rhythm. No S3 or S4.  No murmur, no rubs, or gallops appreciated. Abdomen: Soft, non-tender, non-distended with normoactive bowel sounds. No hepatomegaly. No rebound/guarding. No obvious abdominal masses. Msk:  Strength and tone appear normal for age. No joint deformities or effusions. Extremities: No clubbing or cyanosis. No lower extremity edema.  Distal pedal pulses are 2+ bilaterally. Neuro: Alert and oriented X 3. Moves all extremities spontaneously. No focal deficits noted. Psych:  Responds to questions appropriately with a normal affect. Skin: No rashes or lesions noted  Wt Readings from Last 3 Encounters:  08/19/16 (!) 311 lb 12.8 oz (141.4 kg)  02/29/16 (!) 325 lb 6.4 oz (147.6 kg)  02/25/16 (!) 319 lb 14.4 oz (145.1 kg)     Studies/Labs Reviewed:   EKG:  EKG is not ordered today.    Recent Labs: 02/03/2016: TSH 3.131 02/05/2016: Hemoglobin 15.0; Platelets 226 02/06/2016: B Natriuretic Peptide 255.5; BUN 18; Creatinine, Ser 0.84; Potassium 3.7; Sodium 138 07/10/2016: ALT 43   Lipid Panel    Component Value Date/Time   CHOL 100 04/30/2016 0851   TRIG 89 04/30/2016 0851   HDL 26 (L) 04/30/2016 0851   CHOLHDL 3.8 04/30/2016 0851   VLDL 18 04/30/2016 0851   LDLCALC 56 04/30/2016 0851     Additional studies/ records that were reviewed today include:   Cardiac Catheterization: 01/2016  There is mild left ventricular systolic dysfunction.  LV end diastolic pressure is moderately elevated.  The left ventricular ejection fraction is 50-55% by visual estimate.  Mid RCA lesion, 70 %stenosed.  Dist RCA lesion, 60 %stenosed.  Prox Cx lesion, 100 %stenosed.  Ost LAD to Prox LAD lesion, 70 %stenosed.  1st Diag lesion, 75 %stenosed.   Mr. Fails has a total circumflex with grade 2-3 collaterals the distal OM branches. He does have moderate proximal LAD and diagonal branch disease as well as moderate mid dominant RCA disease. His EF is preserved in the 50-55% range with inferoapical hypokinesia. I believe he had a completed infarct proximally 6 days ago with post-MI angina. He's been pain free since he's been the hospital. At this point, I think we will treat him medically and reserve intervention on the circumflex for recalcitrant symptoms. We may consider outpatient stress testing to look for viable myocardium and inducible ischemia.   Echocardiogram: 01/2016 Study Conclusions  - Left ventricle: The cavity size was normal. Wall thickness was   normal. Systolic function was mildly reduced. The estimated   ejection fraction was in the range of 45% to 50%. Features are   consistent with a pseudonormal left ventricular filling pattern,   with concomitant abnormal relaxation and increased filling   pressure (grade 2 diastolic dysfunction). - Left atrium: The atrium was mildly dilated. - Right ventricle: Systolic function was mildly reduced.   Assessment:    1. Coronary artery disease involving native coronary artery of native heart without angina pectoris   2. Hyperlipidemia LDL goal <70   3. Essential hypertension   4. OSA (obstructive sleep apnea)   5. Obesity (BMI 30-39.9)   6. Tobacco use      Plan:   In order of problems listed above:  1. CAD - had a  recent NSTEMI in 01/2016 with cath showing 100% Prox Cx stenosis, 70% Prox LAD, 75% 1st Diag, and 60-70% RCA stenosis with medical therapy pursued as the LCx had collateral flow noted. -  he denies any recent chest pain or dyspnea on exertion.  - continue medical therapy with ASA, Plavix, BB (cardioselective in the setting of known COPD), statin, and Imdur.  Importance of smoking cessation was reviewed with the patient.    2. HLD - Lipid Panel in 04/2016 showed total cholesterol of 100, HDL 26, and LDL 56. At goal with LDL < 70. LFT's in 06/2016 WNL with Alk Phos being chronically elevated.  - continue Atorvastatin 483m daily.   3. HTN - BP is well-controlled at 112/76 during today's visit. - continue Bisoprolol 51mdaily and Lisinopril 83m483maily.   4. OSA - continue CPAP.   5. Obseity  - BMI at 37.95. He has lost over 30 lbs since his MI and 14 lbs since his last office visit with dietary changes. - he was congratulated in this with continued diet and exercise encouraged.   6. Tobacco Use - he has resumed smoking, now at 1 ppd. - the importance of tobacco cessation in the setting of his diffuse CAD was reviewed with the patient. He is trying to quit.    Medication Adjustments/Labs and Tests Ordered: Current medicines are reviewed at length with the patient today.  Concerns regarding medicines are outlined above.  Medication changes, Labs and Tests ordered today are listed in the Patient Instructions below. Patient Instructions  Medication Instructions: Your physician recommends that you continue on your current medications as directed. Please refer to the Current Medication list given to you today.  If you need a refill on your cardiac medications before your next appointment, please call your pharmacy.   Follow-Up: Your physician wants you to follow-up in: 6 months with Dr. BerGwenlyn Foundou will receive a reminder letter in the mail two months in advance. If you don't receive a letter,  please call our office to schedule this follow-up appointment.   [facility]     Signed, BriErma HeritageA-C  08/19/2016 8:48 AM    [address]  [id number]: [phone]: [phone] [address] [id number]: [phone]

## 2016-08-19 ENCOUNTER — Ambulatory Visit (INDEPENDENT_AMBULATORY_CARE_PROVIDER_SITE_OTHER): Payer: Medicare Other | Admitting: Student

## 2016-08-19 ENCOUNTER — Encounter: Payer: Self-pay | Admitting: Student

## 2016-08-19 VITALS — BP 112/76 | HR 73 | Ht 76.0 in | Wt 311.8 lb

## 2016-08-19 DIAGNOSIS — I10 Essential (primary) hypertension: Secondary | ICD-10-CM | POA: Diagnosis not present

## 2016-08-19 DIAGNOSIS — E785 Hyperlipidemia, unspecified: Secondary | ICD-10-CM | POA: Diagnosis not present

## 2016-08-19 DIAGNOSIS — E669 Obesity, unspecified: Secondary | ICD-10-CM | POA: Diagnosis not present

## 2016-08-19 DIAGNOSIS — Z720 Tobacco use: Secondary | ICD-10-CM | POA: Diagnosis not present

## 2016-08-19 DIAGNOSIS — G4733 Obstructive sleep apnea (adult) (pediatric): Secondary | ICD-10-CM

## 2016-08-19 DIAGNOSIS — I2510 Atherosclerotic heart disease of native coronary artery without angina pectoris: Secondary | ICD-10-CM | POA: Insufficient documentation

## 2016-08-19 MED ORDER — BISOPROLOL FUMARATE 5 MG PO TABS: 5.0000 mg | ORAL_TABLET | Freq: Every day | ORAL | 3 refills | Status: DC

## 2016-08-19 MED ORDER — ATORVASTATIN CALCIUM 80 MG PO TABS: 80.0000 mg | ORAL_TABLET | Freq: Every day | ORAL | 3 refills | Status: DC

## 2016-08-19 MED ORDER — LISINOPRIL 5 MG PO TABS: 5.0000 mg | ORAL_TABLET | Freq: Every day | ORAL | 3 refills | Status: DC

## 2016-08-19 MED ORDER — ISOSORBIDE MONONITRATE ER 30 MG PO TB24: 30.0000 mg | ORAL_TABLET | Freq: Every day | ORAL | 3 refills | Status: DC

## 2016-08-19 MED ORDER — FUROSEMIDE 20 MG PO TABS: 20.0000 mg | ORAL_TABLET | Freq: Every day | ORAL | 3 refills | Status: DC

## 2016-08-19 MED ORDER — CLOPIDOGREL BISULFATE 75 MG PO TABS: 75.0000 mg | ORAL_TABLET | Freq: Every day | ORAL | 3 refills | Status: DC

## 2016-08-19 NOTE — Patient Instructions (Signed)
Medication Instructions: Your physician recommends that you continue on your current medications as directed. Please refer to the Current Medication list given to you today.  If you need a refill on your cardiac medications before your next appointment, please call your pharmacy.    Follow-Up: Your physician wants you to follow-up in: 6 months with Dr. [name]. You will receive a reminder letter in the mail two months in advance. If you don't receive a letter, please call our office to schedule this follow-up appointment.    Thank you for choosing [facility] at [facility]!!

## 2016-08-24 ENCOUNTER — Other Ambulatory Visit: Payer: Self-pay | Admitting: Student

## 2016-08-25 ENCOUNTER — Encounter: Payer: Self-pay | Admitting: Student

## 2016-08-25 ENCOUNTER — Encounter: Payer: Self-pay | Admitting: Adult Health

## 2016-08-25 ENCOUNTER — Other Ambulatory Visit: Payer: Self-pay | Admitting: Student

## 2016-08-25 ENCOUNTER — Ambulatory Visit (INDEPENDENT_AMBULATORY_CARE_PROVIDER_SITE_OTHER): Payer: Medicare Other | Admitting: Adult Health

## 2016-08-25 DIAGNOSIS — G4733 Obstructive sleep apnea (adult) (pediatric): Secondary | ICD-10-CM

## 2016-08-25 DIAGNOSIS — I2510 Atherosclerotic heart disease of native coronary artery without angina pectoris: Secondary | ICD-10-CM

## 2016-08-25 DIAGNOSIS — J439 Emphysema, unspecified: Secondary | ICD-10-CM | POA: Diagnosis not present

## 2016-08-25 DIAGNOSIS — E669 Obesity, unspecified: Secondary | ICD-10-CM

## 2016-08-25 MED ORDER — LOSARTAN POTASSIUM 25 MG PO TABS: 25.0000 mg | ORAL_TABLET | Freq: Every day | ORAL | 11 refills | Status: DC

## 2016-08-25 NOTE — Telephone Encounter (Signed)
REFILL 

## 2016-08-25 NOTE — Patient Instructions (Signed)
Continue on current regimen  Continue on CPAP At bedtime  .  Follow up in 6 months and As needed

## 2016-08-25 NOTE — Progress Notes (Signed)
@Patient  ID: John Griffin, male    DOB: 06-19-1950, 66 y.o.   MRN: [id number]  Chief Complaint  Patient presents with  . Follow-up    OSA     Referring provider: Stephens Shire, MD  HPI: 66 yo male followed for COPD and OSA   TEST  PFT (04/2015)  FEV1  2.30  56%.  DLCO 66% HST (06/2015)  AHI 44.   08/25/2016 Follow up : COPD and OSA  Pt returns for 6 month follow up . Says he is doing well.  Wear CPAP every night  Gets in 7-8 hrs. Feels rested. Does not miss a night .  Download shows excellent compliance with AHI 2/hr . avg usage 7.5 hr .  Denies significant daytime sleepiness.   Remains on Symbicort Twice daily  . Says breathing is doing okay with no wheezing  Does have a dry cough most days. Has sinus drainage .  On ACE inhibitior .  Denies chest pain, orthopnea, edema or fever.     No Known Allergies  Immunization History  Administered Date(s) Administered  . Influenza, High Dose Seasonal PF 10/24/2015  . Influenza,inj,Quad PF,36+ Mos 02/28/2015  . Pneumococcal Conjugate-13 02/25/2016  . Pneumococcal Polysaccharide-23 02/28/2015    Past Medical History:  Diagnosis Date  . Arthritis    hands   . CAD (coronary artery disease)    a. 01/2016: NSTEMI with cath showing 100% Prox Cx stenosis, 70% Prox LAD, 75% 1st Diag, and 60-70% RCA stenosis with medical therapy pursued as the LCx had collateral flow noted  . COPD (chronic obstructive pulmonary disease) ([place])   . Prostate cancer [facility])    s/p prostatectomy  . Tobacco use   . Umbilical hernia   . Urinary frequency     Tobacco History: History  Smoking Status  . Current Every Day Smoker  . Packs/day: 1.00  . Years: 40.00  . Types: Cigarettes  . Last attempt to quit: 02/03/2016  Smokeless Tobacco  . Never Used    Comment: back to smoking 0.5-0.75ppd since 03/2016   Ready to quit: Not Answered Counseling given: Not Answered   Outpatient Encounter Prescriptions as of 08/25/2016  Medication Sig  .  acetaminophen (TYLENOL) 500 MG tablet Take 1,000 mg by mouth every 6 (six) hours as needed for mild pain. Reported on 05/09/2015  . albuterol (PROVENTIL HFA;VENTOLIN HFA) 108 (90 Base) MCG/ACT inhaler Inhale 2 puffs into the lungs every 4 (four) hours as needed for wheezing or shortness of breath.  Marland Kitchen aspirin 81 MG chewable tablet Chew 1 tablet (81 mg total) by mouth daily.  Marland Kitchen atorvastatin (LIPITOR) 80 MG tablet Take 1 tablet (80 mg total) by mouth daily at 6 PM.  . bisoprolol (ZEBETA) 5 MG tablet Take 1 tablet (5 mg total) by mouth daily.  . budesonide-formoterol (SYMBICORT) 160-4.5 MCG/ACT inhaler Inhale 2 puffs into the lungs 2 (two) times daily.  . calcium carbonate (CALCIUM 600) 1500 (600 Ca) MG TABS tablet Take 600 mg of elemental calcium by mouth 2 (two) times daily with a meal.  . calcium carbonate (TUMS EX) 750 MG chewable tablet Chew 1,500 mg by mouth as needed for heartburn.   . cetirizine (ZYRTEC) 10 MG tablet Take 10 mg by mouth daily. Reported on 03/14/2015  . Cholecalciferol (D3 HIGH POTENCY) 2000 units CAPS Take 1 capsule by mouth daily.  . clopidogrel (PLAVIX) 75 MG tablet Take 1 tablet (75 mg total) by mouth daily.  . fluticasone (FLONASE) 50 MCG/ACT nasal spray Place  1 spray into both nostrils daily.   . furosemide (LASIX) 20 MG tablet Take 1 tablet (20 mg total) by mouth daily.  . nitroGLYCERIN (NITROSTAT) 0.4 MG SL tablet Place 1 tablet (0.4 mg total) under the tongue every 5 (five) minutes x 3 doses as needed for chest pain.  . [DISCONTINUED] isosorbide mononitrate (IMDUR) 30 MG 24 hr tablet Take 1 tablet (30 mg total) by mouth daily.  . [DISCONTINUED] lisinopril (PRINIVIL,ZESTRIL) 5 MG tablet Take 1 tablet (5 mg total) by mouth daily.  . [DISCONTINUED] potassium chloride SA (K-DUR,KLOR-CON) 10 MEQ tablet Take 1 tablet (10 mEq total) by mouth daily.   No facility-administered encounter medications on file as of 08/25/2016.      Review of Systems  Constitutional:   No  weight  loss, night sweats,  Fevers, chills, fatigue, or  lassitude.  HEENT:   No headaches,  Difficulty swallowing,  Tooth/dental problems, or  Sore throat,                No sneezing, itching, ear ache, +nasal congestion, post nasal drip,   CV:  No chest pain,  Orthopnea, PND, swelling in lower extremities, anasarca, dizziness, palpitations, syncope.   GI  No heartburn, indigestion, abdominal pain, nausea, vomiting, diarrhea, change in bowel habits, loss of appetite, bloody stools.   Resp:    No wheezing.  No chest wall deformity  Skin: no rash or lesions.  GU: no dysuria, change in color of urine, no urgency or frequency.  No flank pain, no hematuria   MS:  No joint pain or swelling.  No decreased range of motion.  No back pain.    Physical Exam  BP 134/72 (BP Location: Left Arm, Cuff Size: Normal)   Pulse 71   Ht 6\' 4"  (1.93 m)   Wt (!) 309 lb (140.2 kg) Comment: per pt- unable to weigh in office.  SpO2 95%   BMI 37.61 kg/m   GEN: A/Ox3; pleasant , NAD obese    HEENT:  [place]/AT,  EACs-clear, TMs-wnl, NOSE-clear, THROAT-clear, no lesions, no postnasal drip or exudate noted.   NECK:  Supple w/ fair ROM; no JVD; normal carotid impulses w/o bruits; no thyromegaly or nodules palpated; no lymphadenopathy.    RESP  Clear  P & A; w/o, wheezes/ rales/ or rhonchi. no accessory muscle use, no dullness to percussion  CARD:  RRR, no m/r/g, tr  peripheral edema, pulses intact, no cyanosis or clubbing.  GI:   Soft & nt; nml bowel sounds; no organomegaly or masses detected.   Musco: Warm bil, no deformities or joint swelling noted.   Neuro: alert, no focal deficits noted.    Skin: Warm, no lesions or rashes    Lab Results:  CBC    ProBNP No results found for: PROBNP  Imaging: No results found.   Assessment & Plan:   COPD (chronic obstructive pulmonary disease) ([place]) Compensated on present regimen   Plan  Cont on current regimen   OSA (obstructive sleep  apnea) Compensated on CPAP   Plan  Cont on CPAP .  Wt loss    Obesity Wt loss      Rexene Edison, NP 08/25/2016

## 2016-08-25 NOTE — Assessment & Plan Note (Signed)
Compensated on CPAP   Plan  Cont on CPAP .  Wt loss

## 2016-08-25 NOTE — Assessment & Plan Note (Signed)
Compensated on present regimen  Plan   Cont on current regimen   

## 2016-08-25 NOTE — Assessment & Plan Note (Signed)
Wt loss  

## 2016-08-27 DIAGNOSIS — I2510 Atherosclerotic heart disease of native coronary artery without angina pectoris: Secondary | ICD-10-CM | POA: Diagnosis not present

## 2016-08-27 DIAGNOSIS — E785 Hyperlipidemia, unspecified: Secondary | ICD-10-CM | POA: Diagnosis not present

## 2016-08-27 DIAGNOSIS — Z1211 Encounter for screening for malignant neoplasm of colon: Secondary | ICD-10-CM | POA: Diagnosis not present

## 2016-09-01 ENCOUNTER — Telehealth: Payer: Self-pay | Admitting: Cardiovascular Disease

## 2016-09-01 NOTE — Telephone Encounter (Signed)
Requesting surgical clearance:  1. Type of surgery: Colonoscopy  2. Surgeon: Dr. [name] Norway  3.Surgical Date: 09/09/16  4. Medications that need to be held: ASA and Plavix   5. CAD: Yes  6. I will defer to:  Dr. Pearla Dubonnet Information:  [facility] Phone: [phone]  Fax:  [phone]

## 2016-09-02 NOTE — Telephone Encounter (Signed)
Pt does not have a stent. OK to interrupt anti platelet Rx for colonoscopy

## 2016-09-04 NOTE — Telephone Encounter (Signed)
Routed to number provided via EPIC. 

## 2016-09-05 ENCOUNTER — Other Ambulatory Visit: Payer: Self-pay | Admitting: Gastroenterology

## 2016-09-17 ENCOUNTER — Encounter (HOSPITAL_COMMUNITY): Payer: Self-pay | Admitting: *Deleted

## 2016-09-19 ENCOUNTER — Encounter (HOSPITAL_COMMUNITY)
Admission: RE | Disposition: A | Discharge status: Home / Self Care | Payer: Self-pay | Source: Ambulatory Visit | Attending: Gastroenterology

## 2016-09-19 ENCOUNTER — Ambulatory Visit (HOSPITAL_COMMUNITY): Payer: Medicare Other | Admitting: Anesthesiology

## 2016-09-19 ENCOUNTER — Encounter (HOSPITAL_COMMUNITY): Payer: Self-pay | Admitting: *Deleted

## 2016-09-19 ENCOUNTER — Ambulatory Visit (HOSPITAL_COMMUNITY)
Admission: RE | Admit: 2016-09-19 | Discharge: 2016-09-19 | Disposition: A | Discharge status: Home / Self Care | Payer: Medicare Other | Source: Ambulatory Visit | Attending: Gastroenterology | Admitting: Gastroenterology

## 2016-09-19 DIAGNOSIS — D123 Benign neoplasm of transverse colon: Secondary | ICD-10-CM | POA: Insufficient documentation

## 2016-09-19 DIAGNOSIS — I2510 Atherosclerotic heart disease of native coronary artery without angina pectoris: Secondary | ICD-10-CM | POA: Diagnosis not present

## 2016-09-19 DIAGNOSIS — G4730 Sleep apnea, unspecified: Secondary | ICD-10-CM | POA: Insufficient documentation

## 2016-09-19 DIAGNOSIS — J449 Chronic obstructive pulmonary disease, unspecified: Secondary | ICD-10-CM | POA: Insufficient documentation

## 2016-09-19 DIAGNOSIS — Z6837 Body mass index (BMI) 37.0-37.9, adult: Secondary | ICD-10-CM | POA: Insufficient documentation

## 2016-09-19 DIAGNOSIS — K635 Polyp of colon: Secondary | ICD-10-CM | POA: Insufficient documentation

## 2016-09-19 DIAGNOSIS — Z79899 Other long term (current) drug therapy: Secondary | ICD-10-CM | POA: Diagnosis not present

## 2016-09-19 DIAGNOSIS — D120 Benign neoplasm of cecum: Secondary | ICD-10-CM | POA: Insufficient documentation

## 2016-09-19 DIAGNOSIS — I252 Old myocardial infarction: Secondary | ICD-10-CM | POA: Insufficient documentation

## 2016-09-19 DIAGNOSIS — I110 Hypertensive heart disease with heart failure: Secondary | ICD-10-CM | POA: Diagnosis not present

## 2016-09-19 DIAGNOSIS — Z1211 Encounter for screening for malignant neoplasm of colon: Secondary | ICD-10-CM | POA: Diagnosis not present

## 2016-09-19 DIAGNOSIS — I509 Heart failure, unspecified: Secondary | ICD-10-CM | POA: Diagnosis not present

## 2016-09-19 DIAGNOSIS — Z8546 Personal history of malignant neoplasm of prostate: Secondary | ICD-10-CM | POA: Insufficient documentation

## 2016-09-19 DIAGNOSIS — K5730 Diverticulosis of large intestine without perforation or abscess without bleeding: Secondary | ICD-10-CM | POA: Diagnosis not present

## 2016-09-19 DIAGNOSIS — Z87891 Personal history of nicotine dependence: Secondary | ICD-10-CM | POA: Diagnosis not present

## 2016-09-19 DIAGNOSIS — Z9989 Dependence on other enabling machines and devices: Secondary | ICD-10-CM | POA: Diagnosis not present

## 2016-09-19 DIAGNOSIS — Z7902 Long term (current) use of antithrombotics/antiplatelets: Secondary | ICD-10-CM | POA: Insufficient documentation

## 2016-09-19 DIAGNOSIS — Z7982 Long term (current) use of aspirin: Secondary | ICD-10-CM | POA: Diagnosis not present

## 2016-09-19 DIAGNOSIS — D124 Benign neoplasm of descending colon: Secondary | ICD-10-CM | POA: Diagnosis not present

## 2016-09-19 DIAGNOSIS — Z7951 Long term (current) use of inhaled steroids: Secondary | ICD-10-CM | POA: Insufficient documentation

## 2016-09-19 HISTORY — DX: Sleep apnea, unspecified: G47.30

## 2016-09-19 HISTORY — DX: Acute myocardial infarction, unspecified: I21.9

## 2016-09-19 HISTORY — DX: Pneumonia, unspecified organism: J18.9

## 2016-09-19 HISTORY — PX: COLONOSCOPY WITH PROPOFOL: SHX5780

## 2016-09-19 HISTORY — DX: Dyspnea, unspecified: R06.00

## 2016-09-19 HISTORY — DX: Essential (primary) hypertension: I10

## 2016-09-19 SURGERY — COLONOSCOPY WITH PROPOFOL
Anesthesia: Monitor Anesthesia Care

## 2016-09-19 MED ORDER — PROPOFOL 10 MG/ML IV BOLUS
INTRAVENOUS | Status: AC
  Filled 2016-09-19: qty 40

## 2016-09-19 MED ORDER — LACTATED RINGERS IV SOLN
INTRAVENOUS | Status: DC
  Administered 2016-09-19: 12:00:00 via INTRAVENOUS

## 2016-09-19 MED ORDER — LIDOCAINE 2% (20 MG/ML) 5 ML SYRINGE
INTRAMUSCULAR | Status: DC | PRN
  Administered 2016-09-19: 50 mg via INTRAVENOUS

## 2016-09-19 MED ORDER — PROPOFOL 10 MG/ML IV BOLUS
INTRAVENOUS | Status: AC
  Filled 2016-09-19: qty 20

## 2016-09-19 MED ORDER — PROPOFOL 10 MG/ML IV BOLUS
INTRAVENOUS | Status: DC | PRN
  Administered 2016-09-19: 30 mg via INTRAVENOUS

## 2016-09-19 MED ORDER — PROPOFOL 500 MG/50ML IV EMUL
INTRAVENOUS | Status: DC | PRN
  Administered 2016-09-19: 100 ug/kg/min via INTRAVENOUS

## 2016-09-19 MED ORDER — LIDOCAINE 2% (20 MG/ML) 5 ML SYRINGE
INTRAMUSCULAR | Status: AC
  Filled 2016-09-19: qty 5

## 2016-09-19 MED ORDER — SODIUM CHLORIDE 0.9 % IV SOLN: INTRAVENOUS | Status: DC

## 2016-09-19 SURGICAL SUPPLY — 21 items

## 2016-09-19 NOTE — Transfer of Care (Signed)
Immediate Anesthesia Transfer of Care Note  Patient: John Griffin  Procedure(s) Performed: Procedure(s): COLONOSCOPY WITH PROPOFOL (N/A)  Patient Location: PACU  Anesthesia Type:MAC  Level of Consciousness: awake, alert  and oriented  Airway & Oxygen Therapy: Patient Spontanous Breathing and Patient connected to face mask oxygen  Post-op Assessment: Report given to RN and Post -op Vital signs reviewed and stable  Post vital signs: Reviewed and stable  Last Vitals:  Vitals:   09/19/16 1155  BP: 122/70  Pulse: 74  Resp: (!) 22  Temp: 36.6 C  SpO2: 94%    Last Pain:  Vitals:   09/19/16 1155  TempSrc: Oral         Complications: No apparent anesthesia complications

## 2016-09-19 NOTE — Anesthesia Preprocedure Evaluation (Signed)
Anesthesia Evaluation  Patient identified by MRN, date of birth, ID band Patient awake    Reviewed: Allergy & Precautions, NPO status , Patient's Chart, lab work & pertinent test results, reviewed documented beta blocker date and time   History of Anesthesia Complications Negative for: history of anesthetic complications  Airway Mallampati: II  TM Distance: >3 FB Neck ROM: Full    Dental  (+) Edentulous Upper, Edentulous Lower   Pulmonary shortness of breath, sleep apnea and Continuous Positive Airway Pressure Ventilation , COPD,  COPD inhaler, former smoker,    breath sounds clear to auscultation       Cardiovascular hypertension, Pt. on medications and Pt. on home beta blockers (-) angina+ CAD, + Past MI and +CHF   Rhythm:Regular     Neuro/Psych negative neurological ROS  negative psych ROS   GI/Hepatic negative GI ROS, Neg liver ROS,   Endo/Other  Morbid obesity  Renal/GU negative Renal ROS     Musculoskeletal  (+) Arthritis ,   Abdominal   Peds  Hematology negative hematology ROS (+)   Anesthesia Other Findings   Reproductive/Obstetrics                             Anesthesia Physical Anesthesia Plan  ASA: III  Anesthesia Plan: MAC   Post-op Pain Management:    Induction: Intravenous  PONV Risk Score and Plan: 1 and Treatment may vary due to age or medical condition  Airway Management Planned: Nasal Cannula  Additional Equipment: None  Intra-op Plan:   Post-operative Plan:   Informed Consent: I have reviewed the patients History and Physical, chart, labs and discussed the procedure including the risks, benefits and alternatives for the proposed anesthesia with the patient or authorized representative who has indicated his/her understanding and acceptance.   Dental advisory given  Plan Discussed with: CRNA and Surgeon  Anesthesia Plan Comments:          Anesthesia Quick Evaluation

## 2016-09-19 NOTE — Anesthesia Procedure Notes (Signed)
Performed by: Noralyn Pick D Oxygen Delivery Method: Simple face mask

## 2016-09-19 NOTE — Op Note (Signed)
[facility] Patient Name: John Griffin Procedure Date: 09/19/2016 MRN: [id number] Attending MD: Carol Ada , MD Date of Birth: 22-Nov-1950 CSN: [id number] Age: 66 Admit Type: Outpatient Procedure:                Colonoscopy Indications:              Screening for colorectal malignant neoplasm Providers:                Carol Ada, MD, Angus Seller, Cletis Athens,                            Technician Referring MD:              Medicines:                Propofol per Anesthesia Complications:            No immediate complications. Estimated Blood Loss:     Estimated blood loss was minimal. Procedure:                Pre-Anesthesia Assessment:                           - Prior to the procedure, a History and Physical                            was performed, and patient medications and                            allergies were reviewed. The patient's tolerance of                            previous anesthesia was also reviewed. The risks                            and benefits of the procedure and the sedation                            options and risks were discussed with the patient.                            All questions were answered, and informed consent                            was obtained. Prior Anticoagulants: The patient has                            taken no previous anticoagulant or antiplatelet                            agents. ASA Grade Assessment: III - A patient with                            severe systemic disease. After reviewing the risks  and benefits, the patient was deemed in                            satisfactory condition to undergo the procedure.                           - Sedation was administered by an anesthesia                            professional. Deep sedation was attained.                           After obtaining informed consent, the colonoscope                            was passed under direct  vision. Throughout the                            procedure, the patient's blood pressure, pulse, and                            oxygen saturations were monitored continuously. The                            EC-[id number] ([id number]) scope was introduced through                            the anus and advanced to the the cecum, identified                            by appendiceal orifice and ileocecal valve. The                            colonoscopy was performed without difficulty. The                            patient tolerated the procedure well. The quality                            of the bowel preparation was good. The ileocecal                            valve, appendiceal orifice, and rectum were                            photographed. Scope In: 1:33:52 PM Scope Out: 1:56:22 PM Scope Withdrawal Time: 0 hours 21 minutes 4 seconds  Total Procedure Duration: 0 hours 22 minutes 30 seconds  Findings:      Five sessile polyps were found in the rectum, descending colon,       transverse colon and cecum. The polyps were 2 to 4 mm in size. These       polyps were removed with a cold snare. Resection and retrieval were       complete. Representative samples were obtainedin the rectum. Impression:               -  Five 2 to 4 mm polyps in the rectum, in the                            descending colon, in the transverse colon and in                            the cecum, removed with a cold snare. Resected and                            retrieved. Moderate Sedation:      N/A- Per Anesthesia Care Recommendation:           - Patient has a contact number available for                            emergencies. The signs and symptoms of potential                            delayed complications were discussed with the                            patient. Return to normal activities tomorrow.                            Written discharge instructions were provided to the                             patient.                           - Resume previous diet.                           - Continue present medications.                           - Await pathology results.                           - Repeat colonoscopy in 3 - 5 years for                            surveillance. Procedure Code(s):        --- Professional ---                           [phone], Colonoscopy, flexible; with removal of                            tumor(s), polyp(s), or other lesion(s) by snare                            technique Diagnosis Code(s):        --- Professional ---  K62.1, Rectal polyp                           Z12.11, Encounter for screening for malignant                            neoplasm of colon                           D12.4, Benign neoplasm of descending colon                           D12.3, Benign neoplasm of transverse colon (hepatic                            flexure or splenic flexure)                           D12.0, Benign neoplasm of cecum CPT copyright 2016 American Medical Association. All rights reserved. The codes documented in this report are preliminary and upon coder review may  be revised to meet current compliance requirements. Carol Ada, MD Carol Ada, MD 09/19/2016 2:01:13 PM This report has been signed electronically. Number of Addenda: 0

## 2016-09-19 NOTE — Discharge Instructions (Signed)

## 2016-09-19 NOTE — H&P (Signed)
John Griffin HPI: 66 year old male here today for a screening colonoscopy.  No GI complaints.  Past Medical History:  Diagnosis Date  . Arthritis    hands  and shoulders  . CAD (coronary artery disease)    a. 01/2016: NSTEMI with cath showing 100% Prox Cx stenosis, 70% Prox LAD, 75% 1st Diag, and 60-70% RCA stenosis with medical therapy pursued as the LCx had collateral flow noted  . COPD (chronic obstructive pulmonary disease) ([place])   . Dyspnea   . Hypertension   . Myocardial infarction ([place])    01/2016  . Pneumonia   . Prostate cancer [facility])    s/p prostatectomy  . Sleep apnea    cpap  . Tobacco use   . Umbilical hernia   . Urinary frequency     Past Surgical History:  Procedure Laterality Date  . bone graft right ankle   1972  . CARDIAC CATHETERIZATION N/A 02/04/2016   Procedure: Left Heart Cath and Coronary Angiography;  Surgeon: Lorretta Harp, MD;  Location: [place] [facility];  Service: Cardiovascular;  Laterality: N/A;  . HERNIA REPAIR    . LYMPHADENECTOMY Bilateral 08/17/2014   Procedure: LYMPHADENECTOMY;  Surgeon: Raynelle Bring, MD;  Location: [facility];  Service: Urology;  Laterality: Bilateral;  . right shoulder surgery   1992   . ROBOT ASSISTED LAPAROSCOPIC RADICAL PROSTATECTOMY N/A 08/17/2014   Procedure: ROBOTIC ASSISTED LAPAROSCOPIC RADICAL PROSTATECTOMY LEVEL 3  AND UMBILICAL HERNIA REPAIR;  Surgeon: Raynelle Bring, MD;  Location: [facility];  Service: Urology;  Laterality: N/A;    Family History  Problem Relation Age of Onset  . Dementia Mother   . Heart attack Father 59  . CAD Father        s/p cabg    Social History:  reports that he has quit smoking. His smoking use included Cigarettes. He has a 40.00 pack-year smoking history. He has never used smokeless tobacco. He reports that he drinks alcohol. He reports that he does not use drugs.  Allergies: No Known Allergies  Medications:  Scheduled:  Continuous: . lactated ringers      No results found for  this or any previous visit (from the past 24 hour(s)).   No results found.  ROS:  As stated above in the HPI otherwise negative.  Blood pressure 122/70, pulse 74, temperature 97.9 F (36.6 C), temperature source Oral, resp. rate (!) 22, height 6\' 4"  (1.93 m), weight (!) 138.3 kg (305 lb), SpO2 94 %.    PE: Gen: NAD, Alert and Oriented HEENT:  [place]/AT, EOMI Neck: Supple, no LAD Lungs: CTA Bilaterally CV: RRR without M/G/R ABM: Soft, NTND, +BS Ext: No C/C/E  Assessment/Plan: 1) Screening colonoscopy.  John Griffin D 09/19/2016, 12:00 PM

## 2016-09-23 ENCOUNTER — Encounter (HOSPITAL_COMMUNITY): Payer: Self-pay | Admitting: Gastroenterology

## 2016-09-24 NOTE — Anesthesia Postprocedure Evaluation (Signed)
Anesthesia Post Note  Patient: John Griffin  Procedure(s) Performed: Procedure(s) (LRB): COLONOSCOPY WITH PROPOFOL (N/A)     Patient location during evaluation: Endoscopy Anesthesia Type: MAC Level of consciousness: awake and alert Pain management: pain level controlled Vital Signs Assessment: post-procedure vital signs reviewed and stable Respiratory status: spontaneous breathing, nonlabored ventilation, respiratory function stable and patient connected to nasal cannula oxygen Cardiovascular status: stable and blood pressure returned to baseline Postop Assessment: no signs of nausea or vomiting Anesthetic complications: no    Last Vitals:  Vitals:   09/19/16 1420 09/19/16 1430  BP: (!) 184/76 120/78  Pulse: 70 70  Resp: (!) 21 20  Temp:    SpO2: 96% 95%    Last Pain:  Vitals:   09/19/16 1405  TempSrc: Oral                 Eleena Grater

## 2016-11-26 DIAGNOSIS — C61 Malignant neoplasm of prostate: Secondary | ICD-10-CM | POA: Diagnosis not present

## 2016-12-01 ENCOUNTER — Other Ambulatory Visit: Payer: Self-pay | Admitting: *Deleted

## 2016-12-01 MED ORDER — BISOPROLOL FUMARATE 5 MG PO TABS: 5.0000 mg | ORAL_TABLET | Freq: Every day | ORAL | 3 refills | Status: DC

## 2016-12-03 DIAGNOSIS — C61 Malignant neoplasm of prostate: Secondary | ICD-10-CM | POA: Diagnosis not present

## 2016-12-03 DIAGNOSIS — N5201 Erectile dysfunction due to arterial insufficiency: Secondary | ICD-10-CM | POA: Diagnosis not present

## 2017-02-06 ENCOUNTER — Other Ambulatory Visit: Payer: Self-pay

## 2017-02-06 MED ORDER — BUDESONIDE-FORMOTEROL FUMARATE 160-4.5 MCG/ACT IN AERO: 2.0000 | INHALATION_SPRAY | Freq: Two times a day (BID) | RESPIRATORY_TRACT | 5 refills | Status: DC

## 2017-02-23 ENCOUNTER — Ambulatory Visit (INDEPENDENT_AMBULATORY_CARE_PROVIDER_SITE_OTHER): Payer: Medicare Other | Admitting: Adult Health

## 2017-02-23 ENCOUNTER — Encounter: Payer: Self-pay | Admitting: Adult Health

## 2017-02-23 ENCOUNTER — Ambulatory Visit (INDEPENDENT_AMBULATORY_CARE_PROVIDER_SITE_OTHER)
Admission: RE | Admit: 2017-02-23 | Discharge: 2017-02-23 | Disposition: A | Discharge status: Home / Self Care | Payer: Medicare Other | Source: Ambulatory Visit | Attending: Adult Health | Admitting: Adult Health

## 2017-02-23 VITALS — BP 130/88 | HR 72 | Ht 76.0 in | Wt 325.0 lb

## 2017-02-23 DIAGNOSIS — Z23 Encounter for immunization: Secondary | ICD-10-CM

## 2017-02-23 DIAGNOSIS — J449 Chronic obstructive pulmonary disease, unspecified: Secondary | ICD-10-CM

## 2017-02-23 DIAGNOSIS — G4733 Obstructive sleep apnea (adult) (pediatric): Secondary | ICD-10-CM | POA: Diagnosis not present

## 2017-02-23 NOTE — Patient Instructions (Addendum)
Great Job , keep up good work  Wear CPAP At bedtime   Saline nasal rinses Twice daily  As needed   Saline nasal gel At bedtime  .  Work on weight loss.  Do not drive if sleepy.  Continue on Symbicort 2 puffs Twice daily  , rinse after use.  Follow up with Dr. Elsworth Soho  In 6 months and As needed   Flu shot today .  Chest xray today .

## 2017-02-23 NOTE — Assessment & Plan Note (Addendum)
Compensated on present regimen Flu shot  cxr today f/u copd    Plan Patient Instructions  Great Job , keep up good work  Wear CPAP At bedtime   Saline nasal rinses Twice daily  As needed   Saline nasal gel At bedtime  .  Work on weight loss.  Do not drive if sleepy.  Continue on Symbicort 2 puffs Twice daily  , rinse after use.  Follow up with Dr. Elsworth Soho  In 6 months and As needed   Flu shot today .  Chest xray today .

## 2017-02-23 NOTE — Progress Notes (Signed)
@Patient  ID: John Griffin, male    DOB: 1950-05-21, 67 y.o.   MRN: [id number]  Chief Complaint  Patient presents with  . Follow-up    COPD     Referring provider: Christain Sacramento, MD  HPI: 67 yo male followed for COPD and OSA   TEST  PFT (04/2015) FEV1 2.30 56%. DLCO 66% HST (06/2015) AHI 44.   02/23/2017 Follow up : COPD and OSA  Patient presents for a six-month follow-up.  He has moderate COPD.  Remains on Symbicort twice daily. Says that overall his breathing is doing okay.  He denies any increased cough or shortness of breath. Patient needs flu shot..  Patient has severe sleep apnea.  He remains on CPAP at bedtime.  Says he is tolerating well.  He feels rested with no significant daytime sleepiness.  Download shows excellent compliance with average usage at 8 hours.  Patient is on CPAP 5-15 cm H2O.  AHI is 1.9.  Positive leaks. Patient has recently purchased the so clean CPAP cleaning device.  He says it is working well.   No Known Allergies  Immunization History  Administered Date(s) Administered  . Influenza, High Dose Seasonal PF 10/24/2015, 02/23/2017  . Influenza,inj,Quad PF,6+ Mos 02/28/2015  . Pneumococcal Conjugate-13 02/25/2016  . Pneumococcal Polysaccharide-23 02/28/2015    Past Medical History:  Diagnosis Date  . Arthritis    hands  and shoulders  . CAD (coronary artery disease)    a. 01/2016: NSTEMI with cath showing 100% Prox Cx stenosis, 70% Prox LAD, 75% 1st Diag, and 60-70% RCA stenosis with medical therapy pursued as the LCx had collateral flow noted  . COPD (chronic obstructive pulmonary disease) ([place])   . Dyspnea   . Hypertension   . Myocardial infarction ([place])    01/2016  . Pneumonia   . Prostate cancer [facility])    s/p prostatectomy  . Sleep apnea    cpap  . Tobacco use   . Umbilical hernia   . Urinary frequency     Tobacco History: Social History   Tobacco Use  Smoking Status Former Smoker  . Packs/day: 1.00  . Years: 40.00  .  Pack years: 40.00  . Types: Cigarettes  Smokeless Tobacco Never Used  Tobacco Comment   back to smoking a 1 ppd since 2018   Counseling given: Not Answered Comment: back to smoking a 1 ppd since 2018   Outpatient Encounter Medications as of 02/23/2017  Medication Sig  . acetaminophen (TYLENOL) 500 MG tablet Take 1,000 mg by mouth every 6 (six) hours as needed for mild pain. Reported on 05/09/2015  . albuterol (PROVENTIL HFA;VENTOLIN HFA) 108 (90 Base) MCG/ACT inhaler Inhale 2 puffs into the lungs every 4 (four) hours as needed for wheezing or shortness of breath.  Marland Kitchen aspirin 81 MG chewable tablet Chew 1 tablet (81 mg total) by mouth daily.  Marland Kitchen atorvastatin (LIPITOR) 80 MG tablet Take 1 tablet (80 mg total) by mouth daily at 6 PM. (Patient taking differently: Take 80 mg by mouth daily. )  . bisoprolol (ZEBETA) 5 MG tablet Take 1 tablet (5 mg total) daily by mouth.  . budesonide-formoterol (SYMBICORT) 160-4.5 MCG/ACT inhaler Inhale 2 puffs into the lungs 2 (two) times daily.  . calcium carbonate (CALCIUM 600) 1500 (600 Ca) MG TABS tablet Take 1,200 mg of elemental calcium by mouth daily.   . calcium carbonate (TUMS EX) 750 MG chewable tablet Chew 1 tablet by mouth as needed for heartburn.   . cetirizine (ZYRTEC)  10 MG tablet Take 10 mg by mouth daily.   . Chlorpheniramine-DM (CORICIDIN HBP COUGH/COLD PO) Take 1 tablet by mouth at bedtime as needed (congestion).  . Cholecalciferol (D3 HIGH POTENCY) 2000 units CAPS Take 2,000 Units by mouth daily.   . clopidogrel (PLAVIX) 75 MG tablet Take 1 tablet (75 mg total) by mouth daily.  . fluticasone (FLONASE) 50 MCG/ACT nasal spray Place 2 sprays into both nostrils daily.   . furosemide (LASIX) 20 MG tablet Take 1 tablet (20 mg total) by mouth daily.  . isosorbide mononitrate (IMDUR) 30 MG 24 hr tablet TAKE 1 TABLET (30 MG TOTAL) BY MOUTH DAILY.  Marland Kitchen KLOR-CON M10 10 MEQ tablet TAKE 1 TABLET (10 MEQ TOTAL) BY MOUTH DAILY.  Marland Kitchen losartan (COZAAR) 25 MG tablet  Take 1 tablet (25 mg total) by mouth daily.  . nitroGLYCERIN (NITROSTAT) 0.4 MG SL tablet Place 1 tablet (0.4 mg total) under the tongue every 5 (five) minutes x 3 doses as needed for chest pain.   No facility-administered encounter medications on file as of 02/23/2017.      Review of Systems  Constitutional:   No  weight loss, night sweats,  Fevers, chills, fatigue, or  lassitude.  HEENT:   No headaches,  Difficulty swallowing,  Tooth/dental problems, or  Sore throat,                No sneezing, itching, ear ache, nasal congestion, post nasal drip,   CV:  No chest pain,  Orthopnea, PND, swelling in lower extremities, anasarca, dizziness, palpitations, syncope.   GI  No heartburn, indigestion, abdominal pain, nausea, vomiting, diarrhea, change in bowel habits, loss of appetite, bloody stools.   Resp: No shortness of breath with exertion or at rest.  No excess mucus, no productive cough,  No non-productive cough,  No coughing up of blood.  No change in color of mucus.  No wheezing.  No chest wall deformity  Skin: no rash or lesions.  GU: no dysuria, change in color of urine, no urgency or frequency.  No flank pain, no hematuria   MS:  No joint pain or swelling.  No decreased range of motion.  No back pain.    Physical Exam  BP 130/88 (BP Location: Left Arm, Cuff Size: Normal)   Pulse 72   Ht 6\' 4"  (1.93 m)   Wt (!) 325 lb (147.4 kg)   SpO2 95%   BMI 39.56 kg/m    GEN: A/Ox3; pleasant , NAD, obese    HEENT:  [place]/AT,  EACs-clear, TMs-wnl, NOSE-clear, THROAT-clear, no lesions, no postnasal drip or exudate noted. Class 2-3 MP airway   NECK:  Supple w/ fair ROM; no JVD; normal carotid impulses w/o bruits; no thyromegaly or nodules palpated; no lymphadenopathy.    RESP  Clear  P & A; w/o, wheezes/ rales/ or rhonchi. no accessory muscle use, no dullness to percussion  CARD:  RRR, no m/r/g, tr  peripheral edema, pulses intact, no cyanosis or clubbing.  GI:   Soft & nt; nml bowel  sounds; no organomegaly or masses detected.   Musco: Warm bil, no deformities or joint swelling noted.   Neuro: alert, no focal deficits noted.    Skin: Warm, no lesions or rashes    Lab Results:  CBC  BMET   BNP  ProBNP No results found for: PROBNP  Imaging: Dg Chest 2 View  Result Date: 02/23/2017 CLINICAL DATA:  COPD EXAM: CHEST  2 VIEW COMPARISON:  Heart and mediastinal contours  are within normal limits. No focal opacities or effusions. No acute bony abnormality. FINDINGS: The heart size and mediastinal contours are within normal limits. Both lungs are clear. The visualized skeletal structures are unremarkable. IMPRESSION: No active cardiopulmonary disease. Electronically Signed   By: Rolm Baptise M.D.   On: 02/23/2017 13:32     Assessment & Plan:   COPD (chronic obstructive pulmonary disease) ([place]) Compensated on present regimen Flu shot  cxr today f/u copd    Plan Patient Instructions  Great Job , keep up good work  Wear CPAP At bedtime   Saline nasal rinses Twice daily  As needed   Saline nasal gel At bedtime  .  Work on weight loss.  Do not drive if sleepy.  Continue on Symbicort 2 puffs Twice daily  , rinse after use.  Follow up with Dr. Elsworth Soho  In 6 months and As needed   Flu shot today .  Chest xray today .     OSA (obstructive sleep apnea) Controlled on CPAP   Plan  Patient Instructions  Great Job , keep up good work  Wear CPAP At bedtime   Saline nasal rinses Twice daily  As needed   Saline nasal gel At bedtime  .  Work on weight loss.  Do not drive if sleepy.  Continue on Symbicort 2 puffs Twice daily  , rinse after use.  Follow up with Dr. Elsworth Soho  In 6 months and As needed   Flu shot today .  Chest xray today .         Rexene Edison, NP 02/23/2017

## 2017-02-23 NOTE — Assessment & Plan Note (Signed)
Controlled on CPAP   Plan  Patient Instructions  Great Job , keep up good work  Wear CPAP At bedtime   Saline nasal rinses Twice daily  As needed   Saline nasal gel At bedtime  .  Work on weight loss.  Do not drive if sleepy.  Continue on Symbicort 2 puffs Twice daily  , rinse after use.  Follow up with Dr. Elsworth Soho  In 6 months and As needed   Flu shot today .  Chest xray today .

## 2017-03-05 ENCOUNTER — Telehealth: Payer: Self-pay | Admitting: Pharmacist Clinician (PhC)/ Clinical Pharmacy Specialist

## 2017-03-05 MED ORDER — CARVEDILOL 12.5 MG PO TABS: 12.5000 mg | ORAL_TABLET | Freq: Two times a day (BID) | ORAL | 5 refills | Status: DC

## 2017-03-05 NOTE — Telephone Encounter (Signed)
Bisoprolol is currently experiencing shortages, patient unable to get.  Will switch him from bisoprolol 5 mg qd to carvedilol 12.5 mg bid.    Spoke with patient, he is aware of change.  Has f/u appointment with Dr. [name] on Tuesday.

## 2017-03-09 ENCOUNTER — Other Ambulatory Visit: Payer: Self-pay

## 2017-03-09 MED ORDER — POTASSIUM CHLORIDE CRYS ER 10 MEQ PO TBCR: 10.0000 meq | EXTENDED_RELEASE_TABLET | Freq: Every day | ORAL | 1 refills | Status: DC

## 2017-03-10 ENCOUNTER — Ambulatory Visit (INDEPENDENT_AMBULATORY_CARE_PROVIDER_SITE_OTHER): Payer: Medicare Other | Admitting: Cardiovascular Disease

## 2017-03-10 ENCOUNTER — Encounter: Payer: Self-pay | Admitting: Cardiovascular Disease

## 2017-03-10 VITALS — BP 136/70 | HR 91 | Ht 76.0 in | Wt 326.8 lb

## 2017-03-10 DIAGNOSIS — I10 Essential (primary) hypertension: Secondary | ICD-10-CM | POA: Diagnosis not present

## 2017-03-10 DIAGNOSIS — Z720 Tobacco use: Secondary | ICD-10-CM | POA: Diagnosis not present

## 2017-03-10 DIAGNOSIS — E7800 Pure hypercholesterolemia, unspecified: Secondary | ICD-10-CM

## 2017-03-10 DIAGNOSIS — I214 Non-ST elevation (NSTEMI) myocardial infarction: Secondary | ICD-10-CM

## 2017-03-10 MED ORDER — METOPROLOL TARTRATE 25 MG PO TABS: 25.0000 mg | ORAL_TABLET | Freq: Two times a day (BID) | ORAL | 3 refills | Status: DC

## 2017-03-10 NOTE — Assessment & Plan Note (Signed)
History of CAD status post coronary catheterization by myself after non-STEMI 02/04/16 revealing percent nondominant circumflex, 70% proximal LAD and mid/distal dominant RCA with an EF in the 50-55% range. He was doing well until recently when his bisoprolol was changed to carvedilol resulted in increasing shortness of breath and chest discomfort. I'm going to change his carvedilol to metoprolol and we'll have him see Melvyn Neth back in 2 months.

## 2017-03-10 NOTE — Assessment & Plan Note (Signed)
History of hyperlipidemia on statin therapy. We will recheck a lipid and liver profile 

## 2017-03-10 NOTE — Patient Instructions (Signed)
Medication Instructions: Your physician recommends that you continue on your current medications as directed. Please refer to the Current Medication list given to you today.  STOP Carvedilol  START Metoprolol 25 mg twice daily.   Labwork: Your physician recommends that you return for a FASTING lipid profile and hepatic function panel at your earliest convenience.   Follow-Up: We request that you follow-up in: 2 months with Bernerd Pho, PA and in 12 months with Dr [name].  You will receive a reminder letter in the mail two months in advance. If you don't receive a letter, please call our office to schedule the follow-up appointment.  If you need a refill on your cardiac medications before your next appointment, please call your pharmacy.

## 2017-03-10 NOTE — Assessment & Plan Note (Signed)
History of ongoing tobacco abuse of one pack per day recalcitrant to respond to modification

## 2017-03-10 NOTE — Assessment & Plan Note (Signed)
History of essential hypertension blood pressure 136/70. He is on carvedilol which was recently started in place of bisoprolol in addition to losartan. He is not tolerating carvedilol. He is not tolerating the carvedilol. I will start metoprolol 25 mg by mouth twice a day.

## 2017-03-10 NOTE — Progress Notes (Signed)
03/10/2017 John Griffin   12-05-1950  [id number]  Primary Physician Christain Sacramento, MD Primary Cardiologist: Lorretta Harp MD [name], [place]  HPI:  John Griffin is a 67 y.o.  moderately overweight married Caucasian male father 2, grandfather to 3 grandchildren whose wife is also patient in mind. He is retired Geophysical data processor. I last saw him in the office 02/29/16. He has a history of discontinued tobacco abuse of 45 pack years and COPD, treated hypertension and hyperlipidemia. He had non-STEMI 6 days prior to admission on 02/03/16 with post-MI angina. I catheterized him on 02/04/16 revealing 100 nondominant circumflex, 70% proximal LAD and mid dominant RCA and EF of 50-55%. There was inferoapical hypokinesia. I elected not to intervene because of the subacute nature of the occlusion. He's been asymptomatic since discharge. Since I saw me or go he's done well until recently when his bisoprolol was changed to carvedilol by his pharmacist because of availability issues. He has been symptomatic since that time.     Current Meds  Medication Sig  . acetaminophen (TYLENOL) 500 MG tablet Take 1,000 mg by mouth every 6 (six) hours as needed for mild pain. Reported on 05/09/2015  . albuterol (PROVENTIL HFA;VENTOLIN HFA) 108 (90 Base) MCG/ACT inhaler Inhale 2 puffs into the lungs every 4 (four) hours as needed for wheezing or shortness of breath.  Marland Kitchen aspirin 81 MG chewable tablet Chew 1 tablet (81 mg total) by mouth daily.  Marland Kitchen atorvastatin (LIPITOR) 80 MG tablet Take 1 tablet (80 mg total) by mouth daily at 6 PM. (Patient taking differently: Take 80 mg by mouth daily. )  . budesonide-formoterol (SYMBICORT) 160-4.5 MCG/ACT inhaler Inhale 2 puffs into the lungs 2 (two) times daily.  . calcium carbonate (CALCIUM 600) 1500 (600 Ca) MG TABS tablet Take 1,200 mg of elemental calcium by mouth daily.   . calcium carbonate (TUMS EX) 750 MG chewable tablet Chew 1 tablet by mouth as needed for  heartburn.   . cetirizine (ZYRTEC) 10 MG tablet Take 10 mg by mouth daily.   . Chlorpheniramine-DM (CORICIDIN HBP COUGH/COLD PO) Take 1 tablet by mouth at bedtime as needed (congestion).  . Cholecalciferol (D3 HIGH POTENCY) 2000 units CAPS Take 2,000 Units by mouth daily.   . clopidogrel (PLAVIX) 75 MG tablet Take 1 tablet (75 mg total) by mouth daily.  . fluticasone (FLONASE) 50 MCG/ACT nasal spray Place 2 sprays into both nostrils daily.   . furosemide (LASIX) 20 MG tablet Take 1 tablet (20 mg total) by mouth daily.  . isosorbide mononitrate (IMDUR) 30 MG 24 hr tablet TAKE 1 TABLET (30 MG TOTAL) BY MOUTH DAILY.  Marland Kitchen losartan (COZAAR) 25 MG tablet Take 1 tablet (25 mg total) by mouth daily.  . nitroGLYCERIN (NITROSTAT) 0.4 MG SL tablet Place 1 tablet (0.4 mg total) under the tongue every 5 (five) minutes x 3 doses as needed for chest pain.  . potassium chloride (KLOR-CON M10) 10 MEQ tablet Take 1 tablet (10 mEq total) by mouth daily.  . [DISCONTINUED] carvedilol (COREG) 12.5 MG tablet Take 1 tablet (12.5 mg total) by mouth 2 (two) times daily.     No Known Allergies  Social History   Socioeconomic History  . Marital status: Married    Spouse name: Not on file  . Number of children: Not on file  . Years of education: Not on file  . Highest education level: Not on file  Social Needs  . Financial resource strain: Not on  file  . Food insecurity - worry: Not on file  . Food insecurity - inability: Not on file  . Transportation needs - medical: Not on file  . Transportation needs - non-medical: Not on file  Occupational History  . Not on file  Tobacco Use  . Smoking status: Former Smoker    Packs/day: 1.00    Years: 40.00    Pack years: 40.00    Types: Cigarettes  . Smokeless tobacco: Never Used  . Tobacco comment: back to smoking a 1 ppd since 2018  Substance and Sexual Activity  . Alcohol use: Yes    Alcohol/week: 0.0 oz    Comment: occasional   . Drug use: No  . Sexual  activity: Yes  Other Topics Concern  . 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 [name] with [facility]     Review of Systems: General: negative for chills, fever, night sweats or weight changes.  Cardiovascular: negative for chest pain, dyspnea on exertion, edema, orthopnea, palpitations, paroxysmal nocturnal dyspnea or shortness of breath Dermatological: negative for rash Respiratory: negative for cough or wheezing Urologic: negative for hematuria Abdominal: negative for nausea, vomiting, diarrhea, bright red blood per rectum, melena, or hematemesis Neurologic: negative for visual changes, syncope, or dizziness All other systems reviewed and are otherwise negative except as noted above.    Blood pressure 136/70, pulse 91, height 6\' 4"  (1.93 m), weight (!) 326 lb 12.8 oz (148.2 kg), SpO2 91 %.  General appearance: alert and no distress Neck: no adenopathy, no carotid bruit, no JVD, supple, symmetrical, trachea midline and thyroid not enlarged, symmetric, no tenderness/mass/nodules Lungs: clear to auscultation bilaterally Heart: regular rate and rhythm, S1, S2 normal, no murmur, click, rub or gallop Extremities: extremities normal, atraumatic, no cyanosis or edema Pulses: 2+ and symmetric Skin: Skin color, texture, turgor normal. No rashes or lesions Neurologic: Alert and oriented X 3, normal strength and tone. Normal symmetric reflexes. Normal coordination and gait  EKG sinus rhythm at 91 with left axis deviation and incomplete right bundle-branch block. I personally reviewed this EKG.  ASSESSMENT AND PLAN:   Tobacco use History of ongoing tobacco abuse of one pack per day recalcitrant to respond to modification  Essential hypertension History of essential hypertension blood pressure 136/70. He is on carvedilol which was recently started in place of bisoprolol in addition to  losartan. He is not tolerating carvedilol. He is not tolerating the carvedilol. I will start metoprolol 25 mg by mouth twice a day.  Hyperlipidemia History of hyperlipidemia on statin therapy. We will recheck a lipid and liver profile  NSTEMI (non-ST elevation myocardial infarction) ([place]) History of CAD status post coronary catheterization by myself after non-STEMI 02/04/16 revealing percent nondominant circumflex, 70% proximal LAD and mid/distal dominant RCA with an EF in the 50-55% range. He was doing well until recently when his bisoprolol was changed to carvedilol resulted in increasing shortness of breath and chest discomfort. I'm going to change his carvedilol to metoprolol and we'll have him see Melvyn Neth back in 2 months.      Lorretta Harp MD FACP,FACC,FAHA, [facility] 03/10/2017 10:27 AM

## 2017-03-15 IMAGING — CR DG CHEST 2V
2 series · 2 of 2 positions shown · non-contrast
Comparison: 02/25/2015

CLINICAL DATA: Acid reflux [REDACTED], increased shortness of breath
and chest pain radiating to neck and shoulders since, history COPD,
hypertension, prostate cancer, former smoker

EXAM:
CHEST  2 VIEW

[chest pa]
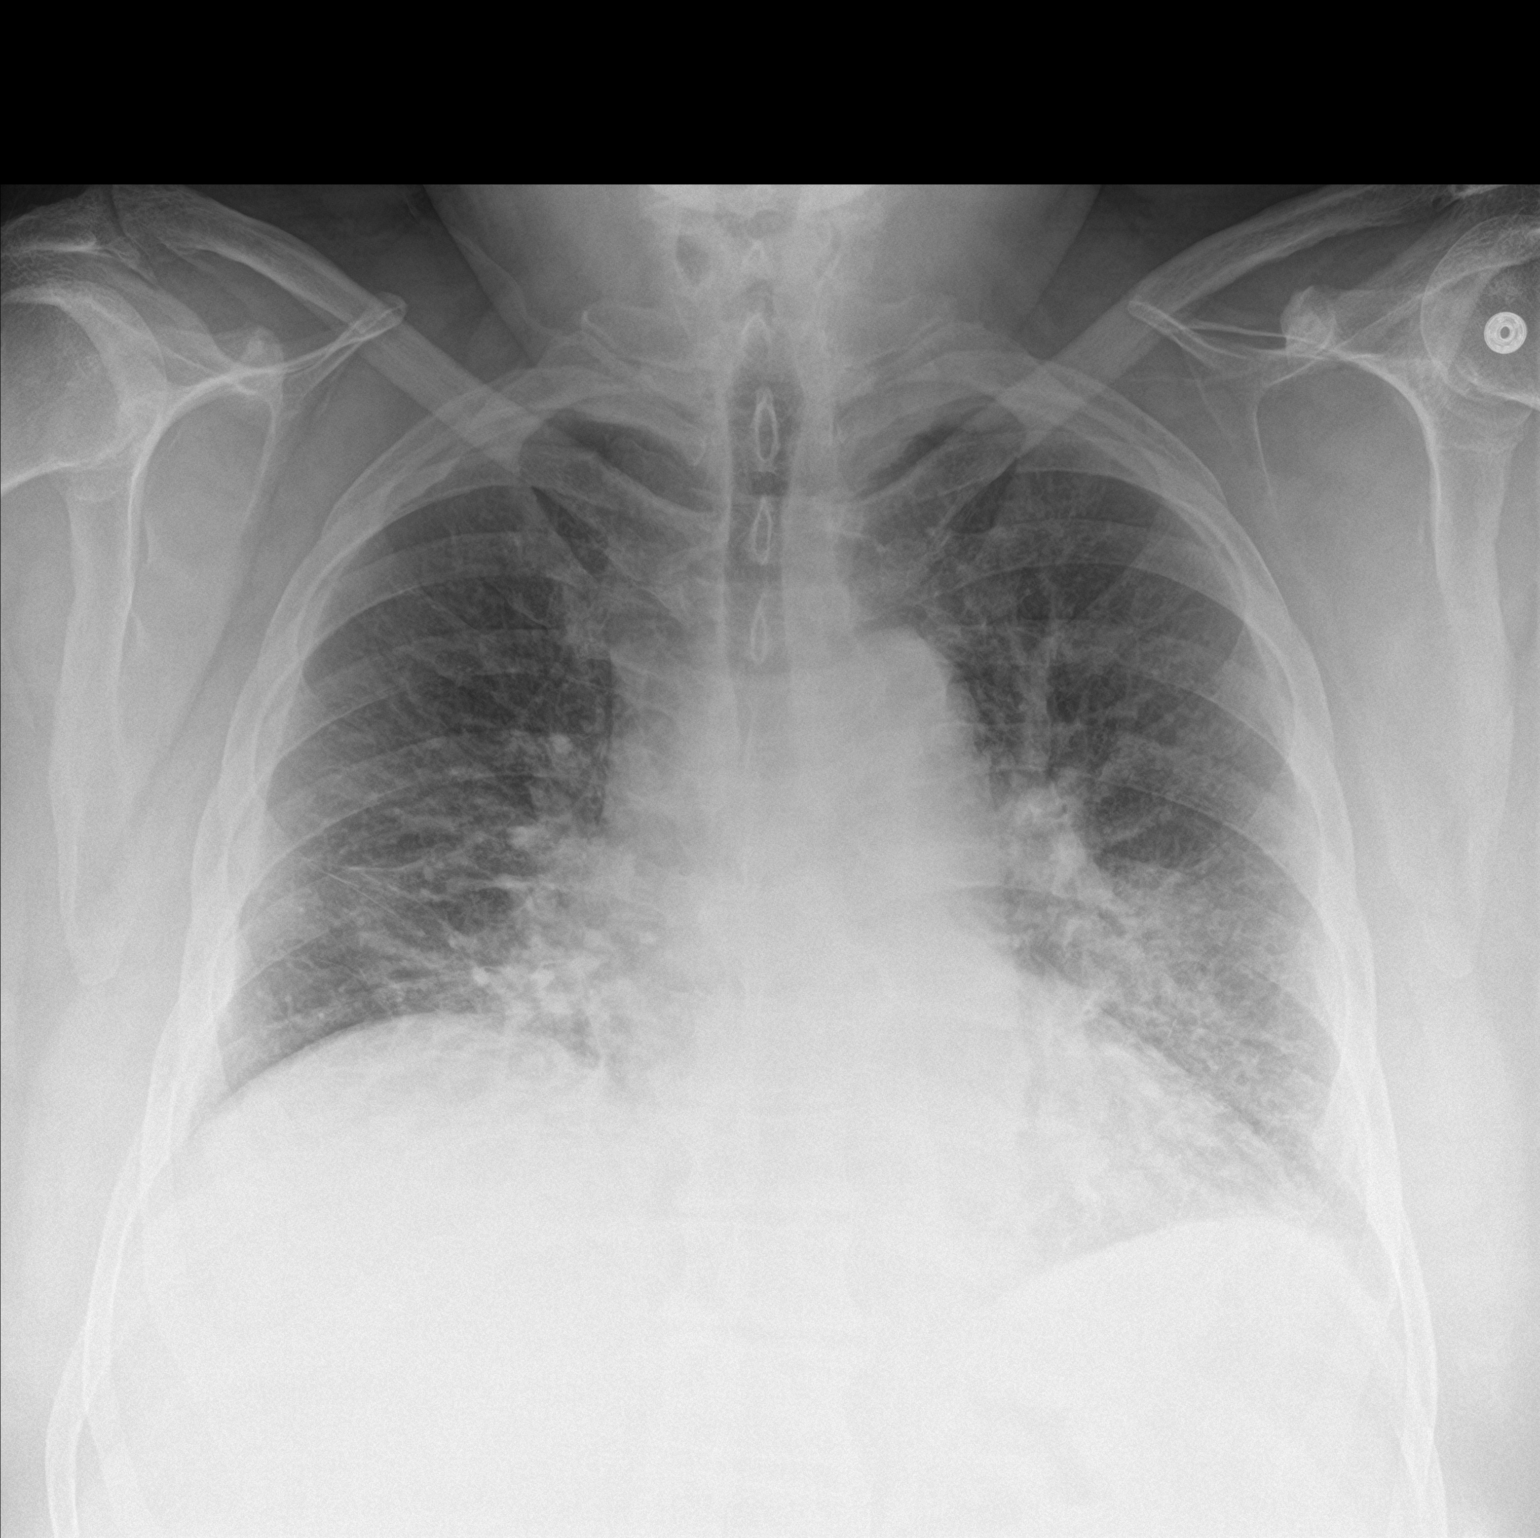

[chest lat]
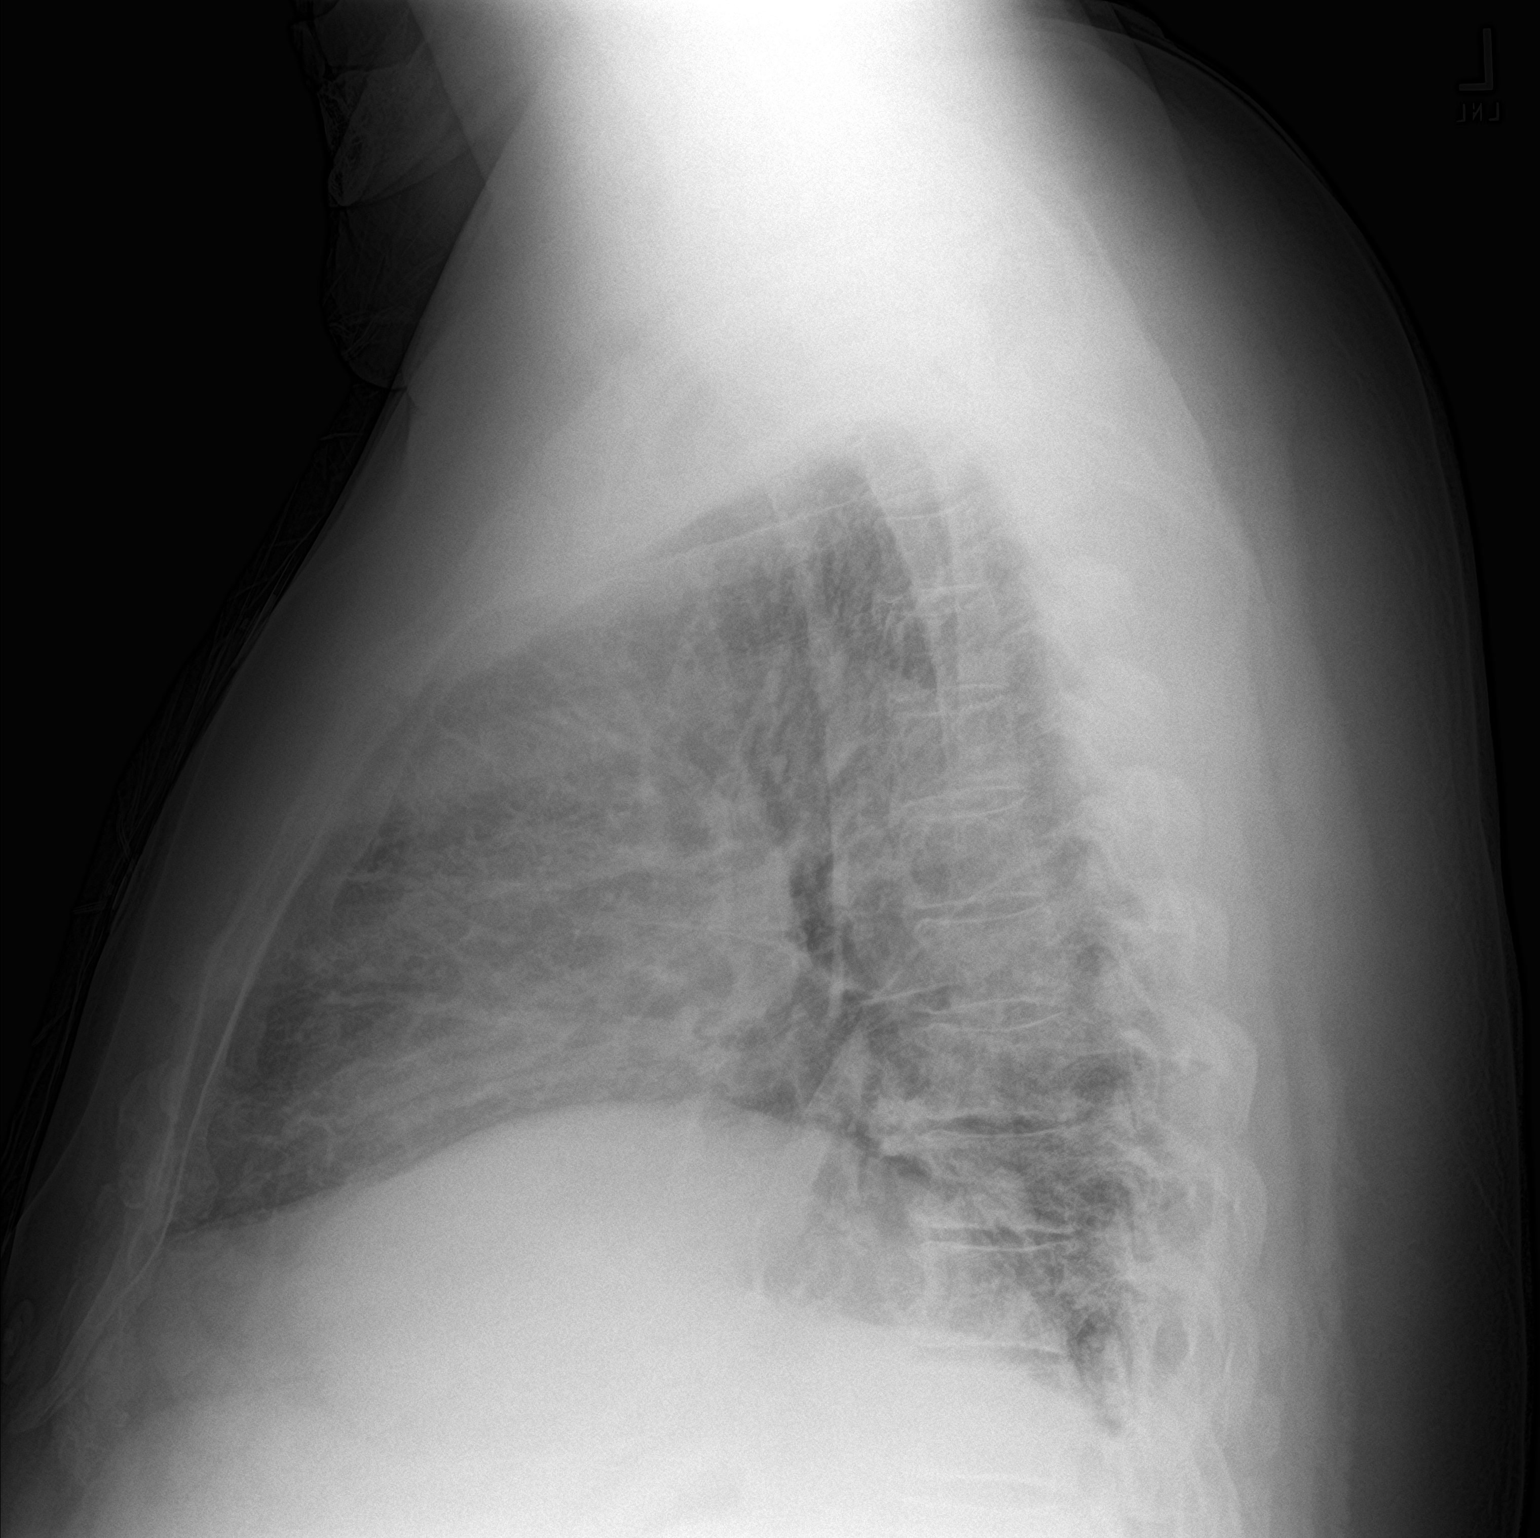

[2 of 2 positions shown; findings below may reference images not displayed]

FINDINGS: Enlargement of cardiac silhouette with pulmonary vascular
congestion.

Mediastinal contours normal.

Perihilar to basilar pulmonary infiltrates bilaterally question
pulmonary edema though infection not excluded.

No pleural effusion or pneumothorax.

Bones demineralized with RIGHT AC joint degenerative changes noted.
IMPRESSION: Enlargement of cardiac silhouette with pulmonary vascular
congestion.

BILATERAL pulmonary infiltrates favor pulmonary edema over
infection.

## 2017-04-29 DIAGNOSIS — Z720 Tobacco use: Secondary | ICD-10-CM | POA: Diagnosis not present

## 2017-04-29 DIAGNOSIS — E7800 Pure hypercholesterolemia, unspecified: Secondary | ICD-10-CM | POA: Diagnosis not present

## 2017-04-29 LAB — HEPATIC FUNCTION PANEL
ALK PHOS: 177 IU/L — AB (ref 39–117)
ALT: 48 IU/L — AB (ref 0–44)
AST: 38 IU/L (ref 0–40)
Albumin: 3.9 g/dL (ref 3.6–4.8)
BILIRUBIN, DIRECT: 0.28 mg/dL (ref 0.00–0.40)
Bilirubin Total: 0.8 mg/dL (ref 0.0–1.2)
Total Protein: 6.6 g/dL (ref 6.0–8.5)

## 2017-04-29 LAB — LIPID PANEL
CHOLESTEROL TOTAL: 114 mg/dL (ref 100–199)
Chol/HDL Ratio: 3.6 ratio (ref 0.0–5.0)
HDL: 32 mg/dL — AB (ref >39)
LDL Calculated: 65 mg/dL (ref 0–99)
TRIGLYCERIDES: 83 mg/dL (ref 0–149)
VLDL CHOLESTEROL CAL: 17 mg/dL (ref 5–40)

## 2017-05-08 ENCOUNTER — Encounter: Payer: Self-pay | Admitting: Cardiology

## 2017-05-08 ENCOUNTER — Ambulatory Visit (INDEPENDENT_AMBULATORY_CARE_PROVIDER_SITE_OTHER): Payer: Medicare Other | Admitting: Cardiology

## 2017-05-08 VITALS — BP 120/72 | HR 94 | Ht 76.0 in | Wt 327.8 lb

## 2017-05-08 DIAGNOSIS — I2510 Atherosclerotic heart disease of native coronary artery without angina pectoris: Secondary | ICD-10-CM

## 2017-05-08 DIAGNOSIS — G4733 Obstructive sleep apnea (adult) (pediatric): Secondary | ICD-10-CM | POA: Diagnosis not present

## 2017-05-08 DIAGNOSIS — E785 Hyperlipidemia, unspecified: Secondary | ICD-10-CM | POA: Diagnosis not present

## 2017-05-08 DIAGNOSIS — Z720 Tobacco use: Secondary | ICD-10-CM | POA: Diagnosis not present

## 2017-05-08 DIAGNOSIS — J449 Chronic obstructive pulmonary disease, unspecified: Secondary | ICD-10-CM | POA: Diagnosis not present

## 2017-05-08 DIAGNOSIS — I10 Essential (primary) hypertension: Secondary | ICD-10-CM

## 2017-05-08 DIAGNOSIS — Z79899 Other long term (current) drug therapy: Secondary | ICD-10-CM

## 2017-05-08 LAB — CBC
Hematocrit: 47.7 % (ref 37.5–51.0)
Hemoglobin: 16.4 g/dL (ref 13.0–17.7)
MCH: 31.7 pg (ref 26.6–33.0)
MCHC: 34.4 g/dL (ref 31.5–35.7)
MCV: 92 fL (ref 79–97)
Platelets: 235 10*3/uL (ref 150–379)
RBC: 5.18 x10E6/uL (ref 4.14–5.80)
RDW: 13.5 % (ref 12.3–15.4)
WBC: 10.6 10*3/uL (ref 3.4–10.8)

## 2017-05-08 LAB — BASIC METABOLIC PANEL
BUN/Creatinine Ratio: 16 (ref 10–24)
BUN: 11 mg/dL (ref 8–27)
CO2: 23 mmol/L (ref 20–29)
Calcium: 9.4 mg/dL (ref 8.6–10.2)
Chloride: 104 mmol/L (ref 96–106)
Creatinine, Ser: 0.68 mg/dL — ABNORMAL LOW (ref 0.76–1.27)
GFR calc Af Amer: 114 mL/min/{1.73_m2} (ref >59)
GFR calc non Af Amer: 99 mL/min/{1.73_m2} (ref >59)
Glucose: 124 mg/dL — ABNORMAL HIGH (ref 65–99)
Potassium: 4 mmol/L (ref 3.5–5.2)
Sodium: 141 mmol/L (ref 134–144)

## 2017-05-08 MED ORDER — NITROGLYCERIN 0.4 MG SL SUBL: 0.4000 mg | SUBLINGUAL_TABLET | SUBLINGUAL | 2 refills | Status: DC | PRN

## 2017-05-08 NOTE — Assessment & Plan Note (Signed)
Controlled- check labs today

## 2017-05-08 NOTE — Patient Instructions (Signed)
Medication Instructions:  Continue current medications  If you need a refill on your cardiac medications before your next appointment, please call your pharmacy.  Labwork: CBC and BMP HERE IN OUR OFFICE AT [facility]  Take the provided lab slips with you to the lab for your blood draw.   You will NOT need to fast   Testing/Procedures: None Ordered  Follow-Up: Your physician wants you to follow-up in: 6 Months with [name]. You should receive a reminder letter in the mail two months in advance. If you do not receive a letter, please call our office [phone].     Thank you for choosing [facility] at [facility]!!

## 2017-05-08 NOTE — Assessment & Plan Note (Signed)
BMI 40 

## 2017-05-08 NOTE — Assessment & Plan Note (Signed)
NSTEMI Jan 2018- cath showed 100% CFX, 70% LAD, EF 50-55%. Pt treated medically secondary to CTO of CFX.

## 2017-05-08 NOTE — Assessment & Plan Note (Signed)
Back to a pack a day

## 2017-05-08 NOTE — Assessment & Plan Note (Signed)
LDL 65 on recent lipid panel

## 2017-05-08 NOTE — Assessment & Plan Note (Signed)
On inhalers, followed by [place] [facility].

## 2017-05-08 NOTE — Assessment & Plan Note (Signed)
Compliant with C-pap 

## 2017-05-08 NOTE — Progress Notes (Signed)
05/08/2017 John Griffin   [birth year]  [id number]  Primary Physician Christain Sacramento, MD Primary Cardiologist: Dr [name]  HPI:  John Griffin is a pleasant 68 y/o male from [place] with a history of NSTEMI in Jan 2018. Cath then showed an occluded CFX and 70% LAD. His EF was normal and it was decided to treat him medically. He has done well since. He is in the office today for routine follow up. Unfortunately he has started smoking again. He blames this on his wife who smokes in the car and in the house. He is not exercising regularly because his son moved back in with them and is staying in the room with the treadmill. The pt is active though doing yard work,  although allergies limit his outdoor time. He denies any chest pain and has not used NTG.    Current Outpatient Medications  Medication Sig Dispense Refill  . acetaminophen (TYLENOL) 500 MG tablet Take 1,000 mg by mouth every 6 (six) hours as needed for mild pain. Reported on 05/09/2015    . albuterol (PROVENTIL HFA;VENTOLIN HFA) 108 (90 Base) MCG/ACT inhaler Inhale 2 puffs into the lungs every 4 (four) hours as needed for wheezing or shortness of breath. 18 g 0  . aspirin 81 MG chewable tablet Chew 1 tablet (81 mg total) by mouth daily.    Marland Kitchen atorvastatin (LIPITOR) 80 MG tablet Take 1 tablet (80 mg total) by mouth daily at 6 PM. (Patient taking differently: Take 80 mg by mouth daily. ) 90 tablet 3  . bisoprolol (ZEBETA) 5 MG tablet Take 1 tablet (5 mg total) daily by mouth. 90 tablet 3  . budesonide-formoterol (SYMBICORT) 160-4.5 MCG/ACT inhaler Inhale 2 puffs into the lungs 2 (two) times daily. 1 Inhaler 5  . calcium carbonate (CALCIUM 600) 1500 (600 Ca) MG TABS tablet Take 1,200 mg of elemental calcium by mouth daily.     . calcium carbonate (TUMS EX) 750 MG chewable tablet Chew 1 tablet by mouth as needed for heartburn.     . cetirizine (ZYRTEC) 10 MG tablet Take 10 mg by mouth daily.     . Chlorpheniramine-DM (CORICIDIN HBP  COUGH/COLD PO) Take 1 tablet by mouth at bedtime as needed (congestion).    . Cholecalciferol (D3 HIGH POTENCY) 2000 units CAPS Take 2,000 Units by mouth daily.     . clopidogrel (PLAVIX) 75 MG tablet Take 1 tablet (75 mg total) by mouth daily. 90 tablet 3  . fluticasone (FLONASE) 50 MCG/ACT nasal spray Place 2 sprays into both nostrils daily.     . furosemide (LASIX) 20 MG tablet Take 1 tablet (20 mg total) by mouth daily. 90 tablet 3  . isosorbide mononitrate (IMDUR) 30 MG 24 hr tablet TAKE 1 TABLET (30 MG TOTAL) BY MOUTH DAILY. 30 tablet 6  . losartan (COZAAR) 25 MG tablet Take 1 tablet (25 mg total) by mouth daily. 30 tablet 11  . metoprolol tartrate (LOPRESSOR) 25 MG tablet Take 1 tablet (25 mg total) by mouth 2 (two) times daily. 60 tablet 3  . nitroGLYCERIN (NITROSTAT) 0.4 MG SL tablet Place 1 tablet (0.4 mg total) under the tongue every 5 (five) minutes x 3 doses as needed for chest pain. 25 tablet 2  . potassium chloride (KLOR-CON M10) 10 MEQ tablet Take 1 tablet (10 mEq total) by mouth daily. 90 tablet 1   No current facility-administered medications for this visit.     No Known Allergies  Past Medical History:  Diagnosis Date  .  Arthritis    hands  and shoulders  . CAD (coronary artery disease)    a. 01/2016: NSTEMI with cath showing 100% Prox Cx stenosis, 70% Prox LAD, 75% 1st Diag, and 60-70% RCA stenosis with medical therapy pursued as the LCx had collateral flow noted  . COPD (chronic obstructive pulmonary disease) ([place])   . Dyspnea   . Hypertension   . Myocardial infarction ([place])    01/2016  . Pneumonia   . Prostate cancer [facility])    s/p prostatectomy  . Sleep apnea    cpap  . Tobacco use   . Umbilical hernia   . Urinary frequency     Social History   Socioeconomic History  . Marital status: Married    Spouse name: Not on file  . Number of children: Not on file  . Years of education: Not on file  . Highest education level: Not on file  Occupational History    . Not on file  Social Needs  . Financial resource strain: Not on file  . Food insecurity:    Worry: Not on file    Inability: Not on file  . Transportation needs:    Medical: Not on file    Non-medical: Not on file  Tobacco Use  . Smoking status: Former Smoker    Packs/day: 1.00    Years: 40.00    Pack years: 40.00    Types: Cigarettes  . Smokeless tobacco: Never Used  . Tobacco comment: back to smoking a 1 ppd since 2018  Substance and Sexual Activity  . Alcohol use: Yes    Alcohol/week: 0.0 oz    Comment: occasional   . Drug use: No  . Sexual activity: Yes  Lifestyle  . Physical activity:    Days per week: Not on file    Minutes per session: Not on file  . Stress: Not on file  Relationships  . Social connections:    Talks on phone: Not on file    Gets together: Not on file    Attends religious service: Not on file    Active member of club or organization: Not on file    Attends meetings of clubs or organizations: Not on file    Relationship status: Not on file  . Intimate partner violence:    Fear of current or ex partner: Not on file    Emotionally abused: Not on file    Physically abused: Not on file    Forced sexual activity: Not on file  Other Topics Concern  . 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 [name] with [facility]     Family History  Problem Relation Age of Onset  . Dementia Mother   . Heart attack Father 69  . CAD Father        s/p cabg     Review of Systems: General: negative for chills, fever, night sweats or weight changes.  Cardiovascular: negative for chest pain, dyspnea on exertion, edema, orthopnea, palpitations, paroxysmal nocturnal dyspnea or shortness of breath Dermatological: negative for rash Respiratory: negative for cough or wheezing Urologic: negative for hematuria Abdominal: negative for nausea, vomiting, diarrhea,  bright red blood per rectum, melena, or hematemesis Neurologic: negative for visual changes, syncope, or dizziness All other systems reviewed and are otherwise negative except as noted above.    Blood pressure 120/72, pulse 94, height 6'  4" (1.93 m), weight (!) 327 lb 12.8 oz (148.7 kg).  General appearance: alert, cooperative, no distress and morbidly obese Neck: no carotid bruit and no JVD Lungs: faint wheezing Lt base Heart: regular rate and rhythm Extremities: extremities normal, atraumatic, no cyanosis or edema Skin: Skin color, texture, turgor normal. No rashes or lesions Neurologic: Grossly normal   ASSESSMENT AND PLAN:   CAD (coronary artery disease) NSTEMI Jan 2018- cath showed 100% CFX, 70% LAD, EF 50-55%. Pt treated medically secondary to CTO of CFX.  Essential hypertension Controlled- check labs today  COPD (chronic obstructive pulmonary disease) ([place]) On inhalers, followed by [facility].  OSA (obstructive sleep apnea) Compliant with C-pap  Obesity BMI 40  Tobacco use Back to a pack a day  Dyslipidemia, goal LDL below 70 LDL 65 on recent lipid panel   PLAN  We had a long discussion about smoking, I did not make much progress. We also discussed exertcise and weight loss. He really doesn't have a PCP and has not had a CBC (last WBC 14k) or a BMP in > year, I ordered those today. F/U Dr [name] in 6 months.   Kerin Ransom PA-C 05/08/2017 9:44 AM

## 2017-06-08 ENCOUNTER — Other Ambulatory Visit: Payer: Self-pay | Admitting: Student

## 2017-06-08 NOTE — Telephone Encounter (Signed)
Rx has been sent to the pharmacy electronically. ° °

## 2017-06-12 DIAGNOSIS — C61 Malignant neoplasm of prostate: Secondary | ICD-10-CM | POA: Diagnosis not present

## 2017-06-19 DIAGNOSIS — R3121 Asymptomatic microscopic hematuria: Secondary | ICD-10-CM | POA: Diagnosis not present

## 2017-06-19 DIAGNOSIS — C61 Malignant neoplasm of prostate: Secondary | ICD-10-CM | POA: Diagnosis not present

## 2017-06-26 ENCOUNTER — Other Ambulatory Visit: Payer: Self-pay | Admitting: Cardiovascular Disease

## 2017-07-22 ENCOUNTER — Other Ambulatory Visit: Payer: Self-pay | Admitting: *Deleted

## 2017-07-22 MED ORDER — BUDESONIDE-FORMOTEROL FUMARATE 160-4.5 MCG/ACT IN AERO: 2.0000 | INHALATION_SPRAY | Freq: Two times a day (BID) | RESPIRATORY_TRACT | 5 refills | Status: DC

## 2017-08-09 ENCOUNTER — Other Ambulatory Visit: Payer: Self-pay | Admitting: Student

## 2017-08-11 NOTE — Telephone Encounter (Signed)
This is Dr. Berry's pt 

## 2017-08-24 ENCOUNTER — Other Ambulatory Visit: Payer: Self-pay | Admitting: Student

## 2017-10-23 ENCOUNTER — Other Ambulatory Visit: Payer: Self-pay | Admitting: Adult Health

## 2017-11-04 DIAGNOSIS — S91302A Unspecified open wound, left foot, initial encounter: Secondary | ICD-10-CM | POA: Diagnosis not present

## 2017-11-09 ENCOUNTER — Other Ambulatory Visit: Payer: Self-pay | Admitting: Student

## 2017-11-09 NOTE — Telephone Encounter (Signed)
Rx request sent to pharmacy.  

## 2017-11-19 ENCOUNTER — Encounter (HOSPITAL_BASED_OUTPATIENT_CLINIC_OR_DEPARTMENT_OTHER): Payer: Self-pay

## 2017-11-19 ENCOUNTER — Other Ambulatory Visit (HOSPITAL_BASED_OUTPATIENT_CLINIC_OR_DEPARTMENT_OTHER): Payer: Self-pay | Admitting: Internal Medicine

## 2017-11-19 ENCOUNTER — Ambulatory Visit (HOSPITAL_COMMUNITY)
Admission: RE | Admit: 2017-11-19 | Discharge: 2017-11-19 | Disposition: A | Discharge status: Home / Self Care | Payer: Medicare Other | Source: Ambulatory Visit | Attending: Internal Medicine | Admitting: Internal Medicine

## 2017-11-19 ENCOUNTER — Encounter (HOSPITAL_BASED_OUTPATIENT_CLINIC_OR_DEPARTMENT_OTHER): Payer: Medicare Other | Attending: Internal Medicine

## 2017-11-19 DIAGNOSIS — G9009 Other idiopathic peripheral autonomic neuropathy: Secondary | ICD-10-CM | POA: Diagnosis not present

## 2017-11-19 DIAGNOSIS — L97422 Non-pressure chronic ulcer of left heel and midfoot with fat layer exposed: Secondary | ICD-10-CM | POA: Insufficient documentation

## 2017-11-19 DIAGNOSIS — G4733 Obstructive sleep apnea (adult) (pediatric): Secondary | ICD-10-CM | POA: Insufficient documentation

## 2017-11-19 DIAGNOSIS — J449 Chronic obstructive pulmonary disease, unspecified: Secondary | ICD-10-CM | POA: Insufficient documentation

## 2017-11-19 DIAGNOSIS — I2510 Atherosclerotic heart disease of native coronary artery without angina pectoris: Secondary | ICD-10-CM | POA: Insufficient documentation

## 2017-11-19 DIAGNOSIS — Z8546 Personal history of malignant neoplasm of prostate: Secondary | ICD-10-CM | POA: Insufficient documentation

## 2017-11-19 DIAGNOSIS — L97429 Non-pressure chronic ulcer of left heel and midfoot with unspecified severity: Secondary | ICD-10-CM | POA: Diagnosis not present

## 2017-11-19 DIAGNOSIS — I252 Old myocardial infarction: Secondary | ICD-10-CM | POA: Diagnosis not present

## 2017-11-19 DIAGNOSIS — I10 Essential (primary) hypertension: Secondary | ICD-10-CM | POA: Insufficient documentation

## 2017-11-19 DIAGNOSIS — M86172 Other acute osteomyelitis, left ankle and foot: Secondary | ICD-10-CM

## 2017-11-26 ENCOUNTER — Encounter (HOSPITAL_BASED_OUTPATIENT_CLINIC_OR_DEPARTMENT_OTHER): Payer: Medicare Other | Attending: Internal Medicine

## 2017-11-26 DIAGNOSIS — I10 Essential (primary) hypertension: Secondary | ICD-10-CM | POA: Insufficient documentation

## 2017-11-26 DIAGNOSIS — L97422 Non-pressure chronic ulcer of left heel and midfoot with fat layer exposed: Secondary | ICD-10-CM | POA: Diagnosis not present

## 2017-11-26 DIAGNOSIS — I252 Old myocardial infarction: Secondary | ICD-10-CM | POA: Diagnosis not present

## 2017-11-26 DIAGNOSIS — G9009 Other idiopathic peripheral autonomic neuropathy: Secondary | ICD-10-CM | POA: Insufficient documentation

## 2017-11-26 DIAGNOSIS — I2510 Atherosclerotic heart disease of native coronary artery without angina pectoris: Secondary | ICD-10-CM | POA: Diagnosis not present

## 2017-11-26 DIAGNOSIS — J449 Chronic obstructive pulmonary disease, unspecified: Secondary | ICD-10-CM | POA: Diagnosis not present

## 2017-11-26 DIAGNOSIS — G4730 Sleep apnea, unspecified: Secondary | ICD-10-CM | POA: Insufficient documentation

## 2017-12-03 ENCOUNTER — Other Ambulatory Visit: Payer: Self-pay | Admitting: Cardiovascular Disease

## 2017-12-03 DIAGNOSIS — S91302A Unspecified open wound, left foot, initial encounter: Secondary | ICD-10-CM | POA: Diagnosis not present

## 2017-12-03 DIAGNOSIS — J449 Chronic obstructive pulmonary disease, unspecified: Secondary | ICD-10-CM | POA: Diagnosis not present

## 2017-12-03 DIAGNOSIS — L97422 Non-pressure chronic ulcer of left heel and midfoot with fat layer exposed: Secondary | ICD-10-CM | POA: Diagnosis not present

## 2017-12-03 DIAGNOSIS — I252 Old myocardial infarction: Secondary | ICD-10-CM | POA: Diagnosis not present

## 2017-12-03 DIAGNOSIS — I2510 Atherosclerotic heart disease of native coronary artery without angina pectoris: Secondary | ICD-10-CM | POA: Diagnosis not present

## 2017-12-03 DIAGNOSIS — G4730 Sleep apnea, unspecified: Secondary | ICD-10-CM | POA: Diagnosis not present

## 2017-12-03 DIAGNOSIS — I10 Essential (primary) hypertension: Secondary | ICD-10-CM | POA: Diagnosis not present

## 2017-12-03 DIAGNOSIS — G9009 Other idiopathic peripheral autonomic neuropathy: Secondary | ICD-10-CM | POA: Diagnosis not present

## 2017-12-04 ENCOUNTER — Ambulatory Visit (INDEPENDENT_AMBULATORY_CARE_PROVIDER_SITE_OTHER): Payer: Medicare Other | Admitting: Pulmonary Disease

## 2017-12-04 ENCOUNTER — Encounter: Payer: Self-pay | Admitting: Pulmonary Disease

## 2017-12-04 VITALS — BP 120/70 | HR 72 | Ht 76.0 in | Wt 325.0 lb

## 2017-12-04 DIAGNOSIS — Z23 Encounter for immunization: Secondary | ICD-10-CM

## 2017-12-04 DIAGNOSIS — Z720 Tobacco use: Secondary | ICD-10-CM | POA: Diagnosis not present

## 2017-12-04 DIAGNOSIS — G4733 Obstructive sleep apnea (adult) (pediatric): Secondary | ICD-10-CM

## 2017-12-04 DIAGNOSIS — J439 Emphysema, unspecified: Secondary | ICD-10-CM | POA: Diagnosis not present

## 2017-12-04 DIAGNOSIS — I2510 Atherosclerotic heart disease of native coronary artery without angina pectoris: Secondary | ICD-10-CM | POA: Diagnosis not present

## 2017-12-04 NOTE — Assessment & Plan Note (Signed)
Will change auto CPAP settings to 10 to 15 cm and follow this up with a download  Weight loss encouraged, compliance with goal of at least 4-6 hrs every night is the expectation. Advised against medications with sedative side effects Cautioned against driving when sleepy - understanding that sleepiness will vary on a day to day basis

## 2017-12-04 NOTE — Patient Instructions (Signed)
We discussed smoking cessation as the only intervention that would add years to your life. Let us know when I to make a quit attempt and we can discuss aids  Refer to lung cancer screening program   Continue on Symbicort.  Increase auto CPAP settings to 10 to 15 cm and check download in 1 month

## 2017-12-04 NOTE — Assessment & Plan Note (Addendum)
Continue with Symbicort Flu shot today

## 2017-12-04 NOTE — Progress Notes (Signed)
   Subjective:    Patient ID: John Griffin, male    DOB: [birth year], 67 y.o.   MRN: [id number]  HPI  67 yo followed for COPD and OSA   He was seen by my partner in 02/2016 and then by NP in our clinic.  He presents to establish with me. He is more than 40-pack-year smoker, quit when he had a heart attack for about 8 months but has started again to about 1 pack/day.  His wife and his son who lives with him smoked too. Breathing is at baseline, Symbicort seems to help.  He admits to sedentary lifestyle.  He had one head cold 3 months ago when he used albuterol inhaler but the saline alert is lasted more than a year.  He is compliant with his CPAP machine and this is certainly helped improve his daytime somnolence and fatigue.  He was a full facemask and is settled down with it.  Since his head cold he feels that he is not getting enough pressure and wonders if this can be increased.  In the daytime he often takes naps on his recliner without his machine Download is not available today but review of his previous download shows auto CPAP settings 5 to 15 cm.  Chest x-ray 02/2017 was normal   Significant tests/ events reviewed Spirometry 11/2017 shows drop in FEV1 to 50% with ratio 82 PFT (04/2015) FEV1 2.30 56%. DLCO 66% HST (06/2015) AHI 44.  Past Medical History:  Diagnosis Date  . Arthritis    hands  and shoulders  . CAD (coronary artery disease)    a. 01/2016: NSTEMI with cath showing 100% Prox Cx stenosis, 70% Prox LAD, 75% 1st Diag, and 60-70% RCA stenosis with medical therapy pursued as the LCx had collateral flow noted  . COPD (chronic obstructive pulmonary disease) ([place])   . Dyspnea   . Hypertension   . Myocardial infarction ([place])    01/2016  . Pneumonia   . Prostate cancer [facility])    s/p prostatectomy  . Sleep apnea    cpap  . Tobacco use   . Umbilical hernia   . Urinary frequency      Review of Systems  neg for any significant sore throat, dysphagia, itching,  sneezing, nasal congestion or excess/ purulent secretions, fever, chills, sweats, unintended wt loss, pleuritic or exertional cp, hempoptysis, orthopnea pnd or change in chronic leg swelling. Also denies presyncope, palpitations, heartburn, abdominal pain, nausea, vomiting, diarrhea or change in bowel or urinary habits, dysuria,hematuria, rash, arthralgias, visual complaints, headache, numbness weakness or ataxia.     Objective:   Physical Exam  Gen. Pleasant, obese, in no distress, normal affect ENT - no pallor,icterus, no post nasal drip, class 2-3 airway Neck: No JVD, no thyromegaly, no carotid bruits Lungs: no use of accessory muscles, no dullness to percussion, decreased without rales or rhonchi  Cardiovascular: Rhythm regular, heart sounds  normal, no murmurs or gallops, no peripheral edema Abdomen: soft and non-tender, no hepatosplenomegaly, BS normal. Musculoskeletal: No deformities, no cyanosis or clubbing Neuro:  alert, non focal, no tremors       Assessment & Plan:

## 2017-12-04 NOTE — Assessment & Plan Note (Addendum)
Smoking cessation was discussed and emphasized as the only intervention that would add years to his life. He is not willing to commit to a quit attempt, it does seem that this would have to be coordinated with his family members also smoke. We will refer him to lung cancer screening program -we discussed risks and benefits of screening CT

## 2017-12-08 ENCOUNTER — Telehealth: Payer: Self-pay | Admitting: Pulmonary Disease

## 2017-12-08 NOTE — Telephone Encounter (Signed)
Noted. I have called the patient and was unable to reach. Left a message to give [place] a call back.

## 2017-12-10 NOTE — Telephone Encounter (Signed)
Checked [employer] to see if I could be able to see pt's information in [place] and I was able to. I was also able to see what pt's old pressure settings were and I saw where the request had been made to have pt's settings changed. I then also saw what pt's pressure settings had been changed to (A triangle was showing up that meant that a prescription change had been done).  Called [facility] and spoke with Jonni Sanger letting him know that we were able to see this and we were also able to print a download of pt's information.  Jonni Sanger expressed understanding. Nothing further needed.

## 2017-12-21 ENCOUNTER — Telehealth: Payer: Self-pay | Admitting: Acute Care

## 2017-12-21 DIAGNOSIS — Z87891 Personal history of nicotine dependence: Secondary | ICD-10-CM

## 2017-12-21 DIAGNOSIS — F17210 Nicotine dependence, cigarettes, uncomplicated: Principal | ICD-10-CM

## 2017-12-21 DIAGNOSIS — Z122 Encounter for screening for malignant neoplasm of respiratory organs: Secondary | ICD-10-CM

## 2017-12-22 NOTE — Telephone Encounter (Signed)
Spoke with pt and scheduled SDMV 01/27/18 9:00 CT ordered Nothing further needed

## 2018-01-26 NOTE — Progress Notes (Signed)
Shared Decision Making Visit Lung Cancer Screening Program [phone])   Eligibility:  Age 68 y.o.  Pack Years Smoking History Calculation 47 pack year smoking history (# packs/per year x # years smoked)  Recent History of coughing up blood  no  Unexplained weight loss? no ( >Than 15 pounds within the last 6 months )  Prior History Lung / other cancer no (Diagnosis within the last 5 years already requiring surveillance chest CT Scans).  Smoking Status Current Smoker  Former Smokers: Years since quit: NA  Quit Date: 2016  Visit Components:  Discussion included one or more decision making aids. yes  Discussion included risk/benefits of screening. yes  Discussion included potential follow up diagnostic testing for abnormal scans. yes  Discussion included meaning and risk of over diagnosis. yes  Discussion included meaning and risk of False Positives. yes  Discussion included meaning of total radiation exposure. yes  Counseling Included:  Importance of adherence to annual lung cancer LDCT screening. yes  Impact of comorbidities on ability to participate in the program. yes  Ability and willingness to under diagnostic treatment. yes  Smoking Cessation Counseling:  Current Smokers:   Discussed importance of smoking cessation. yes  Information about tobacco cessation classes and interventions provided to patient. yes  Patient provided with "ticket" for LDCT Scan. yes  Symptomatic Patient. no  Counseling  Diagnosis Code: Tobacco Use Z72.0  Asymptomatic Patient yes  Counseling (Intermediate counseling: > three minutes counseling) U0454  Former Smokers:   Discussed the importance of maintaining cigarette abstinence. yes  Diagnosis Code: Personal History of Nicotine Dependence. U98.119  Information about tobacco cessation classes and interventions provided to patient. Yes  Patient provided with "ticket" for LDCT Scan. yes  Written Order for Lung Cancer  Screening with LDCT placed in Epic. Yes (CT Chest Lung Cancer Screening Low Dose W/O CM) [id number] Z12.2-Screening of respiratory organs Z87.891-Personal history of nicotine dependence  I have spent 25 minutes of face to face time with John Griffin discussing the risks and benefits of lung cancer screening. We viewed a power point together that explained in detail the above noted topics. We paused at intervals to allow for questions to be asked and answered to ensure understanding.We discussed that the single most powerful action that he can take to decrease his risk of developing lung cancer is to quit smoking. We discussed whether or not he is ready to commit to setting a quit date.He is currently not ready to set a quit date. We discussed options for tools to aid in quitting smoking including nicotine replacement therapy, non-nicotine medications, support groups, Quit Smart classes, and behavior modification. We discussed that often times setting smaller, more achievable goals, such as eliminating 1 cigarette a day for a week and then 2 cigarettes a day for a week can be helpful in slowly decreasing the number of cigarettes smoked. This allows for a sense of accomplishment as well as providing a clinical benefit. I gave him the " Be Stronger Than Your Excuses" card with contact information for community resources, classes, free nicotine replacement therapy, and access to mobile apps, text messaging, and on-line smoking cessation help. I have also given him my card and contact information in the event he needs to contact me. We discussed the time and location of the scan, and that either Doroteo Glassman RN or I will call with the results within 24-48 hours of receiving them. I have offered him  a copy of the power point we viewed  as  a resource in the event they need reinforcement of the concepts we discussed today in the office. The patient verbalized understanding of all of  the above and had no further questions  upon leaving the office. They have my contact information in the event they have any further questions.  I spent 4 minutes counseling on smoking cessation and the health risks of continued tobacco abuse.  I explained to the patient that there has been a high incidence of coronary artery disease noted on these exams. I explained that this is a non-gated exam therefore degree or severity cannot be determined. This patient is on statin therapy. I have asked the patient to follow-up with their PCP regarding any incidental finding of coronary artery disease and management with diet or medication as their PCP  feels is clinically indicated. The patient verbalized understanding of the above and had no further questions upon completion of the visit.      Magdalen Spatz, NP 01/27/2018 10:16 AM

## 2018-01-27 ENCOUNTER — Ambulatory Visit (INDEPENDENT_AMBULATORY_CARE_PROVIDER_SITE_OTHER)
Admission: RE | Admit: 2018-01-27 | Discharge: 2018-01-27 | Disposition: A | Discharge status: Home / Self Care | Payer: Medicare Other | Source: Ambulatory Visit | Attending: Acute Care | Admitting: Acute Care

## 2018-01-27 ENCOUNTER — Encounter: Payer: Self-pay | Admitting: Acute Care

## 2018-01-27 ENCOUNTER — Ambulatory Visit (INDEPENDENT_AMBULATORY_CARE_PROVIDER_SITE_OTHER): Payer: Medicare Other | Admitting: Acute Care

## 2018-01-27 VITALS — BP 120/62 | HR 77 | Ht 76.0 in | Wt 330.0 lb

## 2018-01-27 DIAGNOSIS — Z87891 Personal history of nicotine dependence: Secondary | ICD-10-CM | POA: Diagnosis not present

## 2018-01-27 DIAGNOSIS — F17210 Nicotine dependence, cigarettes, uncomplicated: Secondary | ICD-10-CM | POA: Diagnosis not present

## 2018-01-27 DIAGNOSIS — Z122 Encounter for screening for malignant neoplasm of respiratory organs: Secondary | ICD-10-CM

## 2018-01-27 MED ORDER — BUDESONIDE-FORMOTEROL FUMARATE 160-4.5 MCG/ACT IN AERO: 2.0000 | INHALATION_SPRAY | Freq: Two times a day (BID) | RESPIRATORY_TRACT | 1 refills | Status: DC

## 2018-01-27 MED ORDER — ALBUTEROL SULFATE HFA 108 (90 BASE) MCG/ACT IN AERS: 2.0000 | INHALATION_SPRAY | RESPIRATORY_TRACT | 3 refills | Status: DC | PRN

## 2018-01-29 ENCOUNTER — Other Ambulatory Visit: Payer: Self-pay | Admitting: Acute Care

## 2018-01-29 DIAGNOSIS — Z122 Encounter for screening for malignant neoplasm of respiratory organs: Secondary | ICD-10-CM

## 2018-01-29 DIAGNOSIS — Z87891 Personal history of nicotine dependence: Secondary | ICD-10-CM

## 2018-01-29 DIAGNOSIS — F17210 Nicotine dependence, cigarettes, uncomplicated: Principal | ICD-10-CM

## 2018-02-21 ENCOUNTER — Other Ambulatory Visit: Payer: Self-pay | Admitting: Student

## 2018-03-10 ENCOUNTER — Ambulatory Visit (INDEPENDENT_AMBULATORY_CARE_PROVIDER_SITE_OTHER): Payer: Medicare Other | Admitting: Cardiovascular Disease

## 2018-03-10 ENCOUNTER — Encounter: Payer: Self-pay | Admitting: Cardiovascular Disease

## 2018-03-10 DIAGNOSIS — Z720 Tobacco use: Secondary | ICD-10-CM | POA: Diagnosis not present

## 2018-03-10 DIAGNOSIS — G4733 Obstructive sleep apnea (adult) (pediatric): Secondary | ICD-10-CM

## 2018-03-10 DIAGNOSIS — E785 Hyperlipidemia, unspecified: Secondary | ICD-10-CM | POA: Diagnosis not present

## 2018-03-10 DIAGNOSIS — I2510 Atherosclerotic heart disease of native coronary artery without angina pectoris: Secondary | ICD-10-CM

## 2018-03-10 DIAGNOSIS — I10 Essential (primary) hypertension: Secondary | ICD-10-CM

## 2018-03-10 NOTE — Assessment & Plan Note (Signed)
History of essential hypertension her blood pressure measured today at 100/64.  He is on Zebeta, metoprolol and losartan.  I am going to stop the Zebeta since he is on 2 meds in the same class.

## 2018-03-10 NOTE — Progress Notes (Signed)
03/10/2018 John Griffin   12/18/1950  [id number]  Primary Physician Christain Sacramento, MD Primary Cardiologist: Lorretta Harp MD [name], [place]  HPI:  John Griffin is a 68 y.o.  moderately overweight married Caucasian male father 2, grandfather to 3 grandchildren whose wife is also patient in mind. He is retired Geophysical data processor. I last saw him in the office 03/10/2017. He has a history of discontinued tobacco abuse of 45 pack years and COPD, treated hypertension and hyperlipidemia. He had non-STEMI 6 days prior to admission on 02/03/16 with post-MI angina. I catheterized him on 02/04/16 revealing 100 nondominant circumflex, 70% proximal LAD and mid dominant RCA and EF of 50-55%. There was inferoapical hypokinesia. I elected not to intervene because of the subacute nature of the occlusion.   As I saw him a year ago he is remained stable.  Does continue to smoke a pack a day however.  Unfortunately, he still remains on Zebeta and metoprolol for unclear reasons.  He does have COPD and has chronic shortness of breath.   Current Meds  Medication Sig  . acetaminophen (TYLENOL) 500 MG tablet Take 1,000 mg by mouth every 6 (six) hours as needed for mild pain. Reported on 05/09/2015  . albuterol (PROVENTIL HFA;VENTOLIN HFA) 108 (90 Base) MCG/ACT inhaler Inhale 2 puffs into the lungs every 4 (four) hours as needed for wheezing or shortness of breath.  Marland Kitchen aspirin 81 MG chewable tablet Chew 1 tablet (81 mg total) by mouth daily.  Marland Kitchen atorvastatin (LIPITOR) 80 MG tablet Take 1 tablet (80 mg total) by mouth daily.  . budesonide-formoterol (SYMBICORT) 160-4.5 MCG/ACT inhaler Inhale 2 puffs into the lungs 2 (two) times daily.  . calcium carbonate (CALCIUM 600) 1500 (600 Ca) MG TABS tablet Take 1,200 mg of elemental calcium by mouth daily.   . calcium carbonate (TUMS EX) 750 MG chewable tablet Chew 1 tablet by mouth as needed for heartburn.   . cetirizine (ZYRTEC) 10 MG tablet Take 10 mg by  mouth daily.   . Chlorpheniramine-DM (CORICIDIN HBP COUGH/COLD PO) Take 1 tablet by mouth at bedtime as needed (congestion).  . Cholecalciferol (D3 HIGH POTENCY) 2000 units CAPS Take 2,000 Units by mouth daily.   . clopidogrel (PLAVIX) 75 MG tablet TAKE 1 TABLET BY MOUTH EVERY DAY  . fluticasone (FLONASE) 50 MCG/ACT nasal spray Place 2 sprays into both nostrils daily.   . furosemide (LASIX) 20 MG tablet TAKE 1 TABLET BY MOUTH EVERY DAY  . isosorbide mononitrate (IMDUR) 30 MG 24 hr tablet TAKE 1 TABLET (30 MG TOTAL) BY MOUTH DAILY.  Marland Kitchen KLOR-CON M10 10 MEQ tablet TAKE 1 TABLET BY MOUTH EVERY DAY  . losartan (COZAAR) 25 MG tablet TAKE 1 TABLET BY MOUTH EVERY DAY  . metoprolol tartrate (LOPRESSOR) 25 MG tablet TAKE 1 TABLET BY MOUTH TWICE A DAY  . nitroGLYCERIN (NITROSTAT) 0.4 MG SL tablet Place 1 tablet (0.4 mg total) under the tongue every 5 (five) minutes x 3 doses as needed for chest pain.  . [DISCONTINUED] bisoprolol (ZEBETA) 5 MG tablet TAKE 1 TABLET BY MOUTH EVERY DAY     No Known Allergies  Social History   Socioeconomic History  . Marital status: Married    Spouse name: Not on file  . Number of children: Not on file  . Years of education: Not on file  . Highest education level: Not on file  Occupational History  . Not on file  Social Needs  . Emergency planning/management officer  strain: Not on file  . Food insecurity:    Worry: Not on file    Inability: Not on file  . Transportation needs:    Medical: Not on file    Non-medical: Not on file  Tobacco Use  . Smoking status: Current Every Day Smoker    Packs/day: 1.00    Years: 47.00    Pack years: 47.00    Types: Cigarettes  . Smokeless tobacco: Never Used  . Tobacco comment: back to smoking a 1 ppd since 2018  Substance and Sexual Activity  . Alcohol use: Yes    Alcohol/week: 0.0 standard drinks    Comment: occasional   . Drug use: No  . Sexual activity: Yes  Lifestyle  . Physical activity:    Days per week: Not on file    Minutes  per session: Not on file  . Stress: Not on file  Relationships  . Social connections:    Talks on phone: Not on file    Gets together: Not on file    Attends religious service: Not on file    Active member of club or organization: Not on file    Attends meetings of clubs or organizations: Not on file    Relationship status: Not on file  . Intimate partner violence:    Fear of current or ex partner: Not on file    Emotionally abused: Not on file    Physically abused: Not on file    Forced sexual activity: Not on file  Other Topics Concern  . 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 [name] with [facility]     Review of Systems: General: negative for chills, fever, night sweats or weight changes.  Cardiovascular: negative for chest pain, dyspnea on exertion, edema, orthopnea, palpitations, paroxysmal nocturnal dyspnea or shortness of breath Dermatological: negative for rash Respiratory: negative for cough or wheezing Urologic: negative for hematuria Abdominal: negative for nausea, vomiting, diarrhea, bright red blood per rectum, melena, or hematemesis Neurologic: negative for visual changes, syncope, or dizziness All other systems reviewed and are otherwise negative except as noted above.    Blood pressure 100/64, pulse 82, height 6\' 4"  (1.93 m), weight (!) 332 lb (150.6 kg).  General appearance: alert and no distress Neck: no adenopathy, no carotid bruit, no JVD, supple, symmetrical, trachea midline and thyroid not enlarged, symmetric, no tenderness/mass/nodules Lungs: clear to auscultation bilaterally Heart: regular rate and rhythm, S1, S2 normal, no murmur, click, rub or gallop Extremities: extremities normal, atraumatic, no cyanosis or edema Pulses: 2+ and symmetric Skin: Skin color, texture, turgor normal. No rashes or lesions Neurologic: Alert and oriented X 3, normal  strength and tone. Normal symmetric reflexes. Normal coordination and gait  EKG sinus rhythm at 82 with left axis deviation.  I personally reviewed this EKG.  ASSESSMENT AND PLAN:   Tobacco use History of ongoing tobacco abuse of 1 pack/day recalcitrant to risk factor modification.  Essential hypertension History of essential hypertension her blood pressure measured today at 100/64.  He is on Zebeta, metoprolol and losartan.  I am going to stop the Zebeta since he is on 2 meds in the same class.  COPD (chronic obstructive pulmonary disease) (HCC) History of ongoing tobacco abuse with COPD.  He says he is chronically short of breath.  OSA (obstructive sleep apnea) History of obstructive sleep apnea on CPAP.  Dyslipidemia, goal LDL below 70 History of dyslipidemia on statin therapy with lipid profile performed 04/29/2017 revealing total cholesterol 114, LDL 65 and HDL of 32.  CAD (coronary artery disease) History of CAD status post non-STEMI with cardiac catheterization performed 02/04/2016 revealing total proximal nondominant circumflex with collaterals, 70% proximal LAD and mild disease in his dominant RCA with an EF of 50 to 55%.  He did have inferoapical hypokinesia.  I did not intervene because the subacute nature of the occlusion and he has been asymptomatic since.      Lorretta Harp MD FACP,FACC,FAHA, [facility] 03/10/2018 10:21 AM

## 2018-03-10 NOTE — Patient Instructions (Addendum)
Medication Instructions:  Your physician has recommended you make the following change in your medication:  STOP TAKING YOU BISOPROLOL (ZEBETA).  If you need a refill on your cardiac medications before your next appointment, please call your pharmacy.   Lab work: NONE If you have labs (blood work) drawn today and your tests are completely normal, you will receive your results only by: Marland Kitchen MyChart Message (if you have MyChart) OR . A paper copy in the mail If you have any lab test that is abnormal or we need to change your treatment, we will call you to review the results.  Testing/Procedures: NONE  Follow-Up: At [facility], you and your health needs are our priority.  As part of our continuing mission to provide you with exceptional heart care, we have created designated Provider Care Teams.  These Care Teams include your primary Cardiologist (physician) and Advanced Practice Providers (APPs -  Physician Assistants and Nurse Practitioners) who all work together to provide you with the care you need, when you need it. . You will need a follow up appointment in 12 months.  Please call our office 2 months in advance to schedule this appointment.  You may see Dr. [name] or one of the following Advanced Practice Providers on your designated Care Team:   . Kerin Ransom, [place] . Almyra Deforest, PA-C . Fabian Sharp, PA-C . Jory Sims, DNP . Rosaria Ferries, PA-C . Roby Lofts, PA-C . Sande Rives, PA-C  Any Other Special Instructions Will Be Listed Below (If Applicable). Follow up with Clinical Pharmacist in Hypertension Clinic

## 2018-03-10 NOTE — Assessment & Plan Note (Signed)
History of dyslipidemia on statin therapy with lipid profile performed 04/29/2017 revealing total cholesterol 114, LDL 65 and HDL of 32.

## 2018-03-10 NOTE — Assessment & Plan Note (Signed)
History of ongoing tobacco abuse with COPD.  He says he is chronically short of breath.

## 2018-03-10 NOTE — Assessment & Plan Note (Signed)
History of obstructive sleep apnea on CPAP. 

## 2018-03-10 NOTE — Assessment & Plan Note (Signed)
History of ongoing tobacco abuse of 1 pack/day recalcitrant to risk factor modification. 

## 2018-03-10 NOTE — Assessment & Plan Note (Signed)
History of CAD status post non-STEMI with cardiac catheterization performed 02/04/2016 revealing total proximal nondominant circumflex with collaterals, 70% proximal LAD and mild disease in his dominant RCA with an EF of 50 to 55%.  He did have inferoapical hypokinesia.  I did not intervene because the subacute nature of the occlusion and he has been asymptomatic since.

## 2018-03-16 ENCOUNTER — Ambulatory Visit (INDEPENDENT_AMBULATORY_CARE_PROVIDER_SITE_OTHER): Payer: Medicare Other | Admitting: Pharmacist

## 2018-03-16 VITALS — BP 118/66 | HR 74 | Resp 17 | Ht 76.0 in | Wt 332.0 lb

## 2018-03-16 DIAGNOSIS — I10 Essential (primary) hypertension: Secondary | ICD-10-CM

## 2018-03-16 DIAGNOSIS — I2510 Atherosclerotic heart disease of native coronary artery without angina pectoris: Secondary | ICD-10-CM

## 2018-03-16 NOTE — Assessment & Plan Note (Signed)
Blood pressure and HR remains appropriate after discontinuation bisoprolol. Patient report compliance will all other medications and denies ADRs. Will continue current therapy without changes and follow up as needed.

## 2018-03-16 NOTE — Patient Instructions (Signed)
Return for a follow up appointment as needed  Check your blood pressure at home daily (if able) and keep record of the readings.  Take your BP meds as follows: *NO CHANGES*  Bring all of your meds, your BP cuff and your record of home blood pressures to your next appointment.  Exercise as you're able, try to walk approximately 30 minutes per day.  Keep salt intake to a minimum, especially watch canned and prepared boxed foods.  Eat more fresh fruits and vegetables and fewer canned items.  Avoid eating in fast food restaurants.    HOW TO TAKE YOUR BLOOD PRESSURE: . Rest 5 minutes before taking your blood pressure. .  Don't smoke or drink caffeinated beverages for at least 30 minutes before. . Take your blood pressure before (not after) you eat. . Sit comfortably with your back supported and both feet on the floor (don't cross your legs). . Elevate your arm to heart level on a table or a desk. . Use the proper sized cuff. It should fit smoothly and snugly around your bare upper arm. There should be enough room to slip a fingertip under the cuff. The bottom edge of the cuff should be 1 inch above the crease of the elbow. . Ideally, take 3 measurements at one sitting and record the average.    

## 2018-03-16 NOTE — Progress Notes (Signed)
Patient ID: John Griffin                 DOB: Jul 05, 1950                      MRN: [id number]      HPI: John Griffin is a 68 y.o. male referred by Dr. [name] to HTN clinic. PMH included COPD, hypertension, hyperlipidemia,NSTEMI Bisoprolol was discontinued last week during OV with Dr [name] due to duplication of therapy with metoprolol. Patient presents today for HTN follow up. Patients denies feeling any different since stopping bisoprolol. He is not monitoring his BP at home but denies shortness or breath, dizziness, chest discomfort, blurry vision, or headaches.   Current HTN meds:  Furosemide 20mg  daily Isosorbide mononitrate 30mg  daily Losartan 25mg  daily Metoprolol tartrate 25mg  twice daily  Previously tried:  Bisoprolol - duplicate therapy  BP goal: 130/80  Family History:   Social History: Current smoker (1 pack per day), occasional alcohol  Home BP readings: none provided today  Wt Readings from Last 3 Encounters:  03/16/18 (!) 332 lb (150.6 kg)  03/10/18 (!) 332 lb (150.6 kg)  01/27/18 (!) 330 lb (149.7 kg)   BP Readings from Last 3 Encounters:  03/16/18 118/66  03/10/18 100/64  01/27/18 120/62   Pulse Readings from Last 3 Encounters:  03/16/18 74  03/10/18 82  01/27/18 77    Past Medical History:  Diagnosis Date  . Arthritis    hands  and shoulders  . CAD (coronary artery disease)    a. 01/2016: NSTEMI with cath showing 100% Prox Cx stenosis, 70% Prox LAD, 75% 1st Diag, and 60-70% RCA stenosis with medical therapy pursued as the LCx had collateral flow noted  . COPD (chronic obstructive pulmonary disease) ([place])   . Dyspnea   . Hypertension   . Myocardial infarction ([place])    01/2016  . Pneumonia   . Prostate cancer [facility])    s/p prostatectomy  . Sleep apnea    cpap  . Tobacco use   . Umbilical hernia   . Urinary frequency     Current Outpatient Medications on File Prior to Visit  Medication Sig Dispense Refill  . acetaminophen (TYLENOL) 500 MG  tablet Take 1,000 mg by mouth every 6 (six) hours as needed for mild pain. Reported on 05/09/2015    . albuterol (PROVENTIL HFA;VENTOLIN HFA) 108 (90 Base) MCG/ACT inhaler Inhale 2 puffs into the lungs every 4 (four) hours as needed for wheezing or shortness of breath. 18 g 3  . aspirin 81 MG chewable tablet Chew 1 tablet (81 mg total) by mouth daily.    Marland Kitchen atorvastatin (LIPITOR) 80 MG tablet Take 1 tablet (80 mg total) by mouth daily. 90 tablet 3  . budesonide-formoterol (SYMBICORT) 160-4.5 MCG/ACT inhaler Inhale 2 puffs into the lungs 2 (two) times daily. 3 Inhaler 1  . calcium carbonate (CALCIUM 600) 1500 (600 Ca) MG TABS tablet Take 1,200 mg of elemental calcium by mouth daily.     . calcium carbonate (TUMS EX) 750 MG chewable tablet Chew 1 tablet by mouth as needed for heartburn.     . cetirizine (ZYRTEC) 10 MG tablet Take 10 mg by mouth daily.     . Chlorpheniramine-DM (CORICIDIN HBP COUGH/COLD PO) Take 1 tablet by mouth at bedtime as needed (congestion).    . Cholecalciferol (D3 HIGH POTENCY) 2000 units CAPS Take 2,000 Units by mouth daily.     . clopidogrel (PLAVIX) 75 MG tablet TAKE  1 TABLET BY MOUTH EVERY DAY 90 tablet 3  . fluticasone (FLONASE) 50 MCG/ACT nasal spray Place 2 sprays into both nostrils daily.     . furosemide (LASIX) 20 MG tablet TAKE 1 TABLET BY MOUTH EVERY DAY 90 tablet 3  . isosorbide mononitrate (IMDUR) 30 MG 24 hr tablet TAKE 1 TABLET (30 MG TOTAL) BY MOUTH DAILY. 90 tablet 2  . KLOR-CON M10 10 MEQ tablet TAKE 1 TABLET BY MOUTH EVERY DAY 90 tablet 1  . losartan (COZAAR) 25 MG tablet TAKE 1 TABLET BY MOUTH EVERY DAY 30 tablet 10  . metoprolol tartrate (LOPRESSOR) 25 MG tablet TAKE 1 TABLET BY MOUTH TWICE A DAY 180 tablet 3  . nitroGLYCERIN (NITROSTAT) 0.4 MG SL tablet Place 1 tablet (0.4 mg total) under the tongue every 5 (five) minutes x 3 doses as needed for chest pain. 25 tablet 2   No current facility-administered medications on file prior to visit.     No Known  Allergies  Blood pressure 118/66, pulse 74, resp. rate 17, height 6\' 4"  (1.93 m), weight (!) 332 lb (150.6 kg), SpO2 91 %.  Essential hypertension Blood pressure and HR remains appropriate after discontinuation bisoprolol. Patient report compliance will all other medications and denies ADRs. Will continue current therapy without changes and follow up as needed.   Malikah Lakey Rodriguez-Guzman PharmD, BCPS, [place] [id number] 03/16/2018 4:16 PM

## 2018-04-05 IMAGING — DX DG CHEST 2V
2 series · 2 of 2 positions shown · non-contrast
Comparison: Heart and mediastinal contours are within normal
limits. No focal opacities or effusions. No acute bony abnormality.

CLINICAL DATA: COPD

EXAM:
CHEST  2 VIEW

[chest pa]
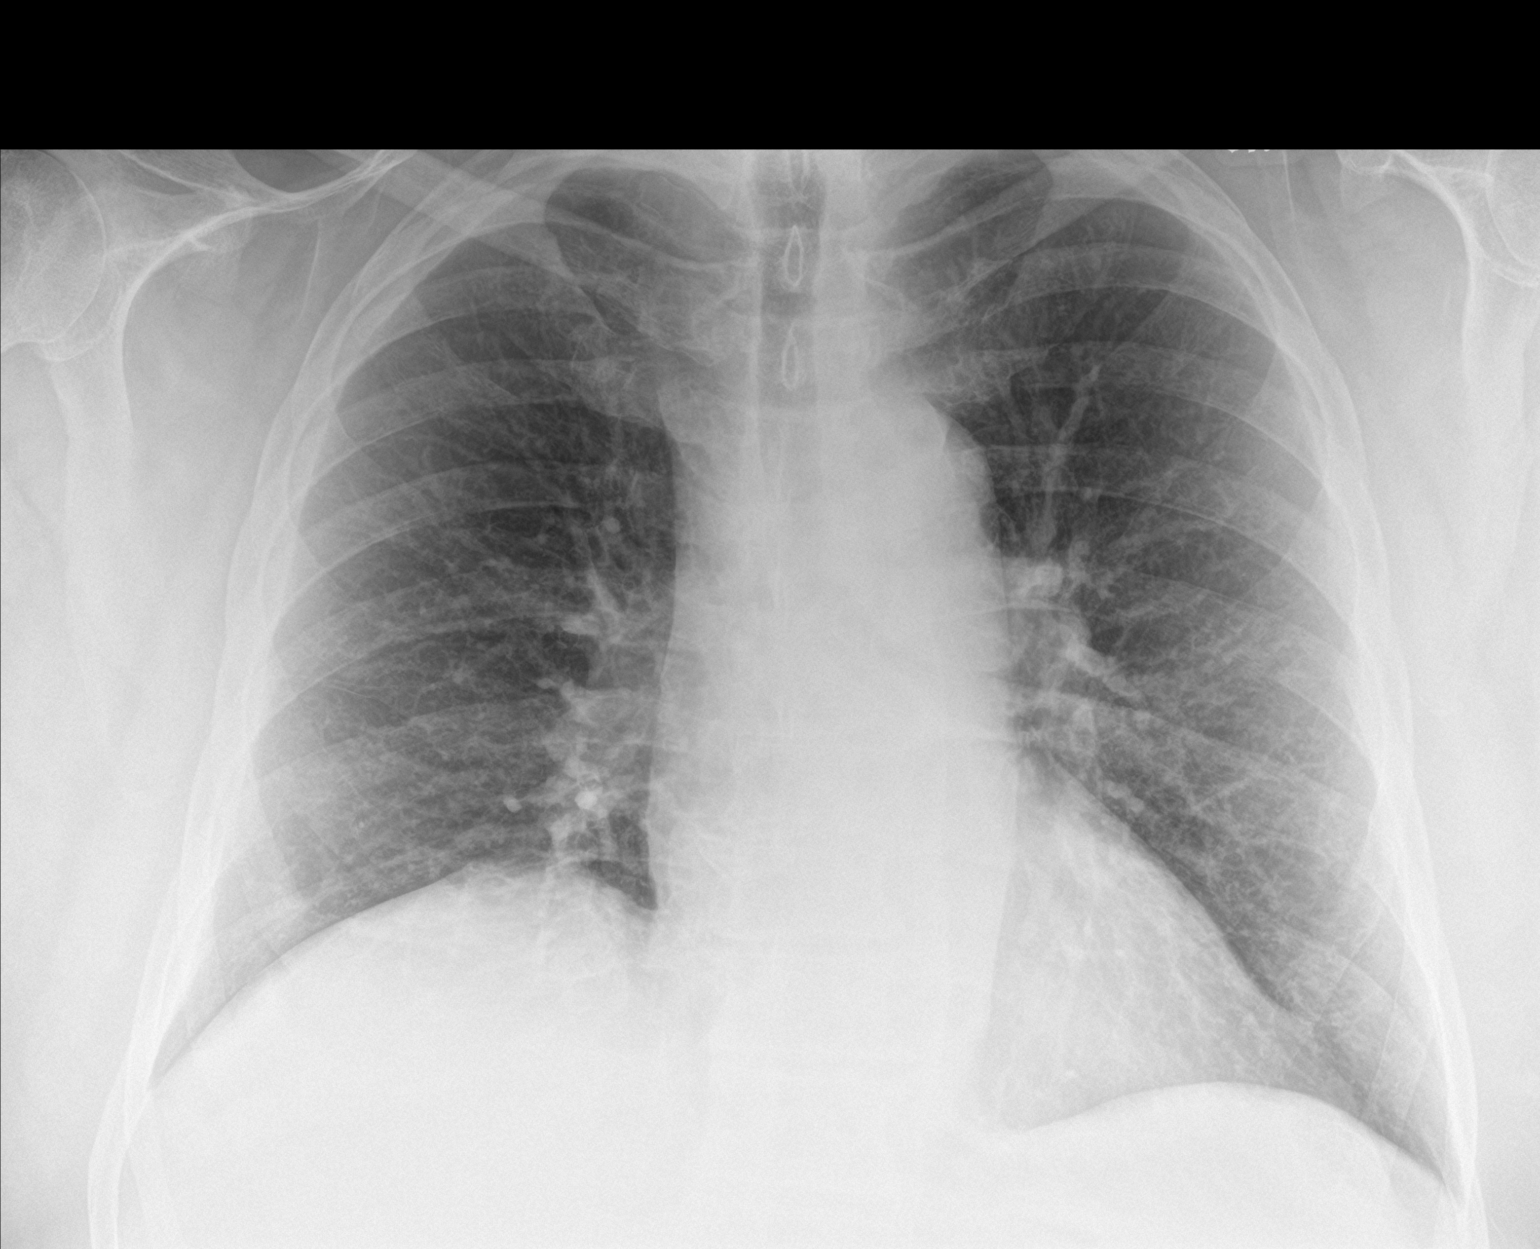

[chest lat]
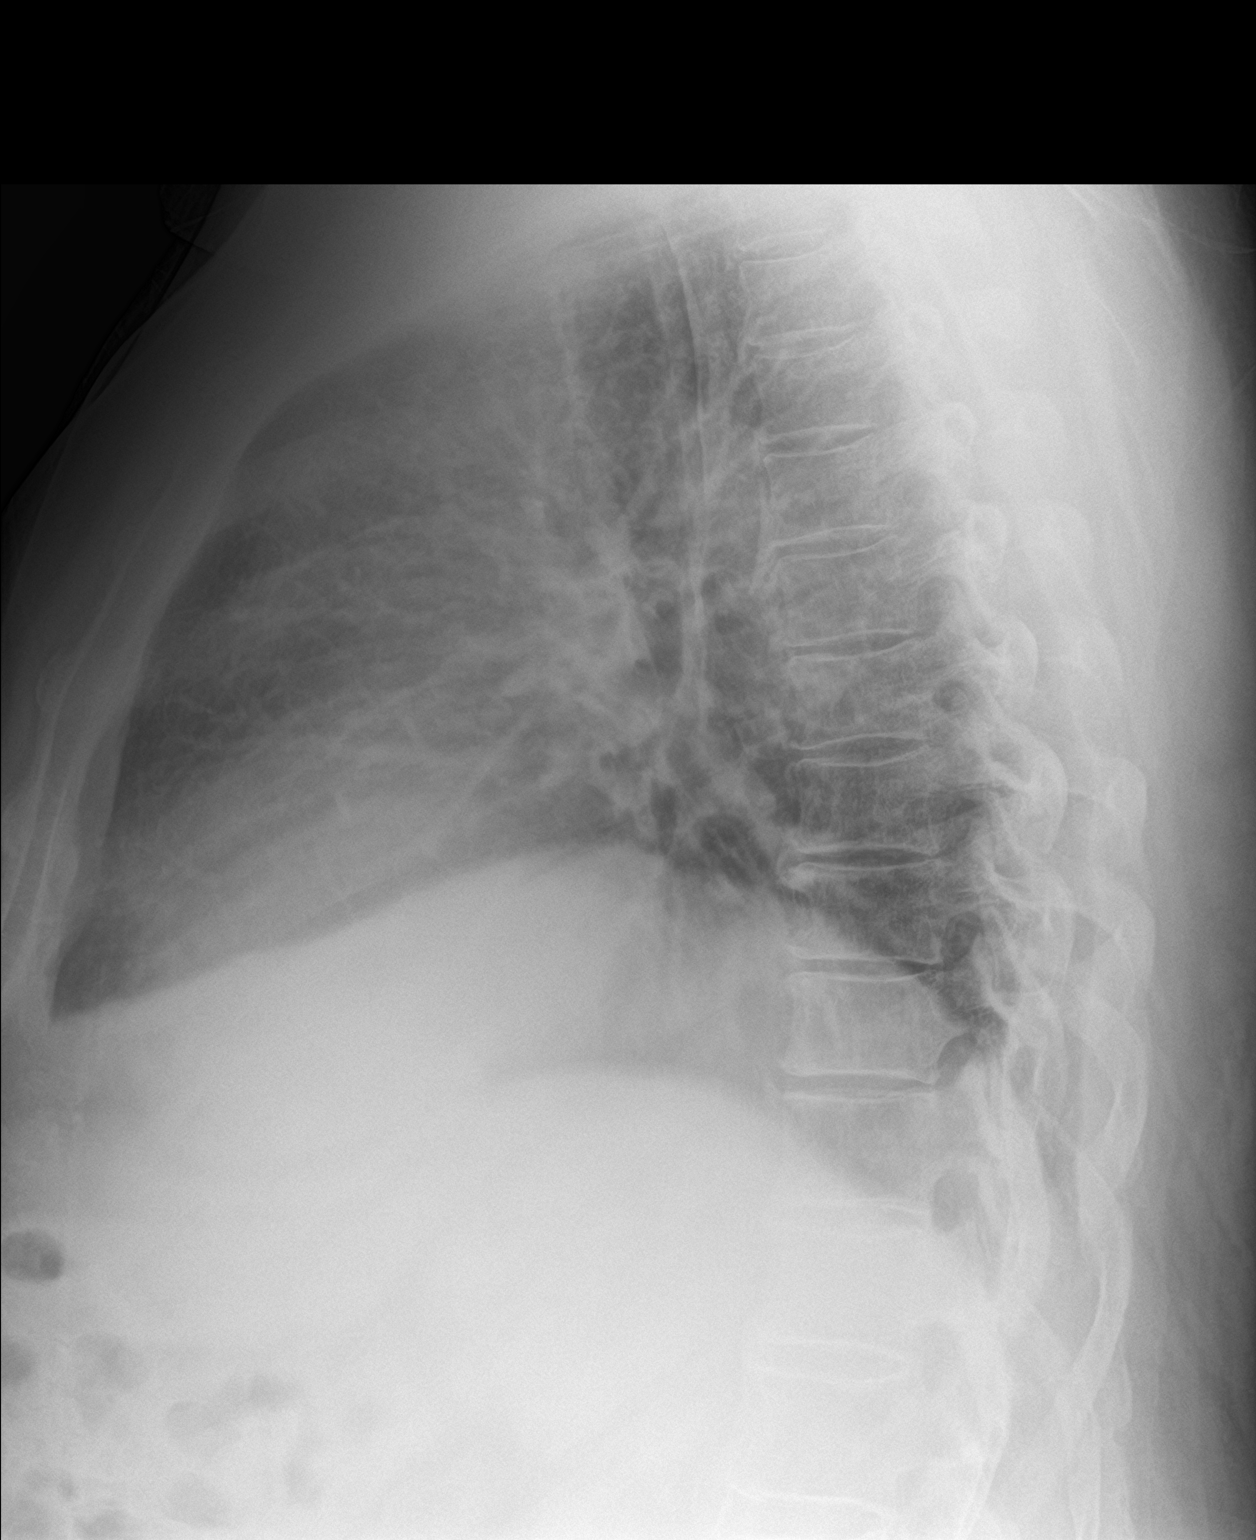

[2 of 2 positions shown; findings below may reference images not displayed]

FINDINGS: The heart size and mediastinal contours are within normal limits.
Both lungs are clear. The visualized skeletal structures are
unremarkable.
IMPRESSION: No active cardiopulmonary disease.

## 2018-04-21 ENCOUNTER — Other Ambulatory Visit: Payer: Self-pay | Admitting: Cardiovascular Disease

## 2018-04-21 NOTE — Telephone Encounter (Signed)
Losartan refill

## 2018-04-30 ENCOUNTER — Telehealth: Payer: Self-pay | Admitting: Pharmacist

## 2018-04-30 DIAGNOSIS — J449 Chronic obstructive pulmonary disease, unspecified: Secondary | ICD-10-CM

## 2018-04-30 MED ORDER — TIOTROPIUM BROMIDE MONOHYDRATE 18 MCG IN CAPS: 18.0000 ug | ORAL_CAPSULE | Freq: Every day | RESPIRATORY_TRACT | 2 refills | Status: DC

## 2018-04-30 NOTE — Progress Notes (Addendum)
John Griffin is a 68 y.o. male who was contacted in collaboration with Dr. Elsworth Soho to attempt de-escalation of inhaled corticosteroids. Patient was contacted via phone with DOB identity verification, patient requested that I discuss with his wife due to difficulty hearing. Patient's wife reports no acute worsening of COPD, patient has not used albuterol inhaler in weeks.  No Known Allergies Medication Sig  acetaminophen (TYLENOL) 500 MG tablet Take 1,000 mg by mouth every 6 (six) hours as needed for mild pain. Reported on 05/09/2015  albuterol (PROVENTIL HFA;VENTOLIN HFA) 108 (90 Base) MCG/ACT inhaler Inhale 2 puffs into the lungs every 4 (four) hours as needed for wheezing or shortness of breath.  aspirin 81 MG chewable tablet Chew 1 tablet (81 mg total) by mouth daily.  atorvastatin (LIPITOR) 80 MG tablet Take 1 tablet (80 mg total) by mouth daily.  budesonide-formoterol (SYMBICORT) 160-4.5 MCG/ACT inhaler Inhale 2 puffs into the lungs 2 (two) times daily.  calcium carbonate (CALCIUM 600) 1500 (600 Ca) MG TABS tablet Take 1,200 mg of elemental calcium by mouth daily.   calcium carbonate (TUMS EX) 750 MG chewable tablet Chew 1 tablet by mouth as needed for heartburn.   cetirizine (ZYRTEC) 10 MG tablet Take 10 mg by mouth daily.   Chlorpheniramine-DM (CORICIDIN HBP COUGH/COLD PO) Take 1 tablet by mouth at bedtime as needed (congestion).  Cholecalciferol (D3 HIGH POTENCY) 2000 units CAPS Take 2,000 Units by mouth daily.   clopidogrel (PLAVIX) 75 MG tablet TAKE 1 TABLET BY MOUTH EVERY DAY  fluticasone (FLONASE) 50 MCG/ACT nasal spray Place 2 sprays into both nostrils daily.   furosemide (LASIX) 20 MG tablet TAKE 1 TABLET BY MOUTH EVERY DAY  isosorbide mononitrate (IMDUR) 30 MG 24 hr tablet TAKE 1 TABLET (30 MG TOTAL) BY MOUTH DAILY.  KLOR-CON M10 10 MEQ tablet TAKE 1 TABLET BY MOUTH EVERY DAY  losartan (COZAAR) 25 MG tablet TAKE 1 TABLET BY MOUTH EVERY DAY  metoprolol tartrate (LOPRESSOR) 25 MG tablet  TAKE 1 TABLET BY MOUTH TWICE A DAY  nitroGLYCERIN (NITROSTAT) 0.4 MG SL tablet Place 1 tablet (0.4 mg total) under the tongue every 5 (five) minutes x 3 doses as needed for chest pain.   Past Medical History:  Diagnosis Date  . Arthritis    hands  and shoulders  . CAD (coronary artery disease)    a. 01/2016: NSTEMI with cath showing 100% Prox Cx stenosis, 70% Prox LAD, 75% 1st Diag, and 60-70% RCA stenosis with medical therapy pursued as the LCx had collateral flow noted  . COPD (chronic obstructive pulmonary disease) ([place])   . Dyspnea   . Hypertension   . Myocardial infarction ([place])    01/2016  . Pneumonia   . Prostate cancer [facility])    s/p prostatectomy  . Sleep apnea    cpap  . Tobacco use   . Umbilical hernia   . Urinary frequency    Social History   Socioeconomic History  . Marital status: Married    Spouse name: Not on file  . Number of children: Not on file  . Years of education: Not on file  . Highest education level: Not on file  Occupational History  . Not on file  Social Needs  . Financial resource strain: Not on file  . Food insecurity:    Worry: Not on file    Inability: Not on file  . Transportation needs:    Medical: Not on file    Non-medical: Not on file  Tobacco Use  .  Smoking status: Current Every Day Smoker    Packs/day: 1.00    Years: 47.00    Pack years: 47.00    Types: Cigarettes  . Smokeless tobacco: Never Used  . Tobacco comment: back to smoking a 1 ppd since 2018  Substance and Sexual Activity  . Alcohol use: Yes    Alcohol/week: 0.0 standard drinks    Comment: occasional   . Drug use: No  . Sexual activity: Yes  Lifestyle  . Physical activity:    Days per week: Not on file    Minutes per session: Not on file  . Stress: Not on file  Relationships  . Social connections:    Talks on phone: Not on file    Gets together: Not on file    Attends religious service: Not on file    Active member of club or organization: Not on file     Attends meetings of clubs or organizations: Not on file    Relationship status: Not on file  Other Topics Concern  . 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 [name] with [facility]   Family History  Problem Relation Age of Onset  . Dementia Mother   . Heart attack Father 50  . CAD Father        s/p cabg   O: Ht Readings from Last 2 Encounters:  03/16/18 6\' 4"  (1.93 m)  03/10/18 6\' 4"  (1.93 m)   Wt Readings from Last 2 Encounters:  03/16/18 (!) 332 lb (150.6 kg)  03/10/18 (!) 332 lb (150.6 kg)   There is no height or weight on file to calculate BMI.  CAT ASSESSMENT  Rank each of the following items on a scale of 0 to 5 (with 5 being most severe) Write a # 0-5 in each box  I never cough (0) > I cough all the time (5) 3  I have no phlegm (mucus) in my chest (0) > My chest is completely full of phlegm (mucus) (5) 0  My chest does not feel tight at all (0) > My chest feels very tight (5) 0  When I walk up a hill or one flight of stairs I am not breathless (0) > When I walk up a hill or one flight of stairs I am very breathless (5) 2  I am not limited doing any activities at home (0) > I am very limited doing activities at home (5) 4  I am confident leaving my home despite my lung function (0) > I am not at all confident leaving my home because of my lung condition (5)  0  I sleep soundly (0) > I don't sleep soundly because of my lung condition (5) 0  I have lots of energy (0) > I have no energy at all (5) 4   Total CAT Score: 13  Most recent blood eosinophil count was 0 cells/microL in 2017.   A/P: . Based on a review of the patient's current COPD medications and symptoms, it is appropriate to switch from Symbicort to Spiriva. Patient was concerned about being able to afford the Spiriva due to recently picking up a 35-month supply of Symbicort. Samples of Spiriva and education were  provided. Patient has used Spiriva in the past and reports some familiarity with the device.  . Counseled patient to report any changes in COPD medications, breathing, or  shortness of breath.  Liana Gerold support with smoking cessation, but patient deferred. He is interested and plans to pursue smoking cessation, but prefers to identify his own strategies. Brief smoking cessation was provided and patient was encouraged. . Will follow up with patient to assess symptoms following therapy change. If symptoms worsen, can consider re-initiating ICS. Will notify pulmonary specialist and PCP of findings and changes.  Follow up phone call due within 1 month and 3 months. Patient was advised to contact me if any changes in condition or questions regarding medications arise. The patient verbalized understanding of information provided by repeating back concepts discussed.

## 2018-05-19 ENCOUNTER — Telehealth: Payer: Self-pay

## 2018-05-19 NOTE — Telephone Encounter (Signed)
John Griffin is a 68 y.o. male reports to clinical pharmacist appointment for COPD medication management. Patient did not bring medication.   Patient's current COPD medication regimen consists of: Spiriva and albuterol   Patient denies COPD related symptoms. Pt has allergies.   Patient reports use of rescue inhaler with an estimated use at about  1 times per week.   Patient denies occurrence of a COPD exacerbation since last appointment.   Patient report a history of smoking with a current pack-per year:  No Known Allergies Prior to Admission medications   Medication Sig Start Date End Date Taking? Authorizing Provider  acetaminophen (TYLENOL) 500 MG tablet Take 1,000 mg by mouth every 6 (six) hours as needed for mild pain. Reported on 05/09/2015    [provider]  albuterol (PROVENTIL HFA;VENTOLIN HFA) 108 (90 Base) MCG/ACT inhaler Inhale 2 puffs into the lungs every 4 (four) hours as needed for wheezing or shortness of breath. 01/27/18   Rigoberto Noel, MD  aspirin 81 MG chewable tablet Chew 1 tablet (81 mg total) by mouth daily. 02/07/16   Strader, Fransisco Hertz, PA-C  atorvastatin (LIPITOR) 80 MG tablet Take 1 tablet (80 mg total) by mouth daily. 08/11/17   Strader, Fransisco Hertz, PA-C  calcium carbonate (CALCIUM 600) 1500 (600 Ca) MG TABS tablet Take 1,200 mg of elemental calcium by mouth daily.     [provider]  calcium carbonate (TUMS EX) 750 MG chewable tablet Chew 1 tablet by mouth as needed for heartburn.     [provider]  cetirizine (ZYRTEC) 10 MG tablet Take 10 mg by mouth daily.     [provider]  Chlorpheniramine-DM (CORICIDIN HBP COUGH/COLD PO) Take 1 tablet by mouth at bedtime as needed (congestion).    [provider]  Cholecalciferol (D3 HIGH POTENCY) 2000 units CAPS Take 2,000 Units by mouth daily.     [provider]  clopidogrel (PLAVIX) 75 MG tablet TAKE 1 TABLET BY MOUTH EVERY DAY 08/11/17   Ahmed Prima, [place] M, PA-C   fluticasone (FLONASE) 50 MCG/ACT nasal spray Place 2 sprays into both nostrils daily.     [provider]  furosemide (LASIX) 20 MG tablet TAKE 1 TABLET BY MOUTH EVERY DAY 08/11/17   Ahmed Prima, [place] M, PA-C  isosorbide mononitrate (IMDUR) 30 MG 24 hr tablet TAKE 1 TABLET (30 MG TOTAL) BY MOUTH DAILY. 11/09/17   Lorretta Harp, MD  KLOR-CON M10 10 MEQ tablet TAKE 1 TABLET BY MOUTH EVERY DAY 02/22/18   Ahmed Prima, Fransisco Hertz, PA-C  losartan (COZAAR) 25 MG tablet TAKE 1 TABLET BY MOUTH EVERY DAY 04/21/18   Lorretta Harp, MD  metoprolol tartrate (LOPRESSOR) 25 MG tablet TAKE 1 TABLET BY MOUTH TWICE A DAY 06/26/17   Lorretta Harp, MD  nitroGLYCERIN (NITROSTAT) 0.4 MG SL tablet Place 1 tablet (0.4 mg total) under the tongue every 5 (five) minutes x 3 doses as needed for chest pain. 05/08/17   Erlene Quan, PA-C  tiotropium (SPIRIVA HANDIHALER) 18 MCG inhalation capsule Place 1 capsule (18 mcg total) into inhaler and inhale daily. 04/30/18 04/30/19  Rigoberto Noel, MD   Past Medical History:  Diagnosis Date  . Arthritis    hands  and shoulders  . CAD (coronary artery disease)    a. 01/2016: NSTEMI with cath showing 100% Prox Cx stenosis, 70% Prox LAD, 75% 1st Diag, and 60-70% RCA stenosis with medical therapy pursued as the LCx had collateral flow noted  . COPD (chronic  obstructive pulmonary disease) ([place])   . Dyspnea   . Hypertension   . Myocardial infarction ([place])    01/2016  . Pneumonia   . Prostate cancer [facility])    s/p prostatectomy  . Sleep apnea    cpap  . Tobacco use   . Umbilical hernia   . Urinary frequency    Social History   Socioeconomic History  . Marital status: Married    Spouse name: Not on file  . Number of children: Not on file  . Years of education: Not on file  . Highest education level: Not on file  Occupational History  . Not on file  Social Needs  . Financial resource strain: Not on file  . Food insecurity:    Worry: Not on file    Inability: Not  on file  . Transportation needs:    Medical: Not on file    Non-medical: Not on file  Tobacco Use  . Smoking status: Current Every Day Smoker    Packs/day: 1.00    Years: 47.00    Pack years: 47.00    Types: Cigarettes  . Smokeless tobacco: Never Used  . Tobacco comment: back to smoking a 1 ppd since 2018  Substance and Sexual Activity  . Alcohol use: Yes    Alcohol/week: 0.0 standard drinks    Comment: occasional   . Drug use: No  . Sexual activity: Yes  Lifestyle  . Physical activity:    Days per week: Not on file    Minutes per session: Not on file  . Stress: Not on file  Relationships  . Social connections:    Talks on phone: Not on file    Gets together: Not on file    Attends religious service: Not on file    Active member of club or organization: Not on file    Attends meetings of clubs or organizations: Not on file    Relationship status: Not on file  Other Topics Concern  . 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 [name] with [facility]   Family History  Problem Relation Age of Onset  . Dementia Mother   . Heart attack Father 65  . CAD Father        s/p cabg    O: Ht Readings from Last 2 Encounters:  03/16/18 6\' 4"  (1.93 m)  03/10/18 6\' 4"  (1.93 m)   Wt Readings from Last 2 Encounters:  03/16/18 (!) 332 lb (150.6 kg)  03/10/18 (!) 332 lb (150.6 kg)   There is no height or weight on file to calculate BMI.  CAT ASSESSMENT  Rank each of the following items on a scale of 0 to 5 (with 5 being most severe) Write a # 0-5 in each box  I never cough (0) > I cough all the time (5) 3  I have no phlegm (mucus) in my chest (0) > My chest is completely full of phlegm (mucus) (5) 3  My chest does not feel tight at all (0) > My chest feels very tight (5) 1  When I walk up a hill or one flight of stairs I am not breathless (0) > When I walk up a hill or one flight  of stairs I am very breathless (5) 4  I am not limited doing any activities at home (0) > I am very limited  doing activities at home (5) 3  I am confident leaving my home despite my lung function (0) > I am not at all confident leaving my home because of my lung condition (5)  0  I sleep soundly (0) > I don't sleep soundly because of my lung condition (5) 0  I have lots of energy (0) > I have no energy at all (5) 4    Total CAT Score: 18 Most recent blood eosinophil count was 0 cells/microL taken on 2017.  A/P:  Findings/Recommendations: . Pt likes the Spiriva. He reports seeing an improvement within 3 days of use. No changes made to patient's COPD regimen.  . Counseled patient to report any changes in COPD medications, breathing, or shortness of breath.   An after visit summary was provided and patient advised to follow up if any changes in condition or questions regarding medications arise.

## 2018-05-31 ENCOUNTER — Ambulatory Visit: Payer: Medicare Other | Admitting: Adult Health

## 2018-07-05 ENCOUNTER — Other Ambulatory Visit (HOSPITAL_COMMUNITY)
Admission: RE | Admit: 2018-07-05 | Discharge: 2018-07-05 | Disposition: A | Discharge status: Home / Self Care | Payer: Medicare Other | Source: Other Acute Inpatient Hospital | Attending: Internal Medicine | Admitting: Internal Medicine

## 2018-07-05 ENCOUNTER — Other Ambulatory Visit: Payer: Self-pay

## 2018-07-05 ENCOUNTER — Encounter (HOSPITAL_BASED_OUTPATIENT_CLINIC_OR_DEPARTMENT_OTHER): Payer: Medicare Other | Attending: Internal Medicine

## 2018-07-05 ENCOUNTER — Other Ambulatory Visit: Payer: Self-pay | Admitting: Cardiovascular Disease

## 2018-07-05 DIAGNOSIS — R7303 Prediabetes: Secondary | ICD-10-CM | POA: Diagnosis not present

## 2018-07-05 DIAGNOSIS — I10 Essential (primary) hypertension: Secondary | ICD-10-CM | POA: Diagnosis not present

## 2018-07-05 DIAGNOSIS — Z8546 Personal history of malignant neoplasm of prostate: Secondary | ICD-10-CM | POA: Insufficient documentation

## 2018-07-05 DIAGNOSIS — G629 Polyneuropathy, unspecified: Secondary | ICD-10-CM | POA: Diagnosis not present

## 2018-07-05 DIAGNOSIS — J449 Chronic obstructive pulmonary disease, unspecified: Secondary | ICD-10-CM | POA: Insufficient documentation

## 2018-07-05 DIAGNOSIS — G9009 Other idiopathic peripheral autonomic neuropathy: Secondary | ICD-10-CM | POA: Diagnosis not present

## 2018-07-05 DIAGNOSIS — I252 Old myocardial infarction: Secondary | ICD-10-CM | POA: Diagnosis not present

## 2018-07-05 DIAGNOSIS — B9689 Other specified bacterial agents as the cause of diseases classified elsewhere: Secondary | ICD-10-CM | POA: Diagnosis not present

## 2018-07-05 DIAGNOSIS — L97422 Non-pressure chronic ulcer of left heel and midfoot with fat layer exposed: Secondary | ICD-10-CM | POA: Insufficient documentation

## 2018-07-05 DIAGNOSIS — R260 Ataxic gait: Secondary | ICD-10-CM | POA: Diagnosis not present

## 2018-07-05 DIAGNOSIS — G4733 Obstructive sleep apnea (adult) (pediatric): Secondary | ICD-10-CM | POA: Diagnosis not present

## 2018-07-05 DIAGNOSIS — I2510 Atherosclerotic heart disease of native coronary artery without angina pectoris: Secondary | ICD-10-CM | POA: Diagnosis not present

## 2018-07-06 ENCOUNTER — Other Ambulatory Visit (HOSPITAL_COMMUNITY): Payer: Self-pay | Admitting: Internal Medicine

## 2018-07-06 ENCOUNTER — Other Ambulatory Visit: Payer: Self-pay

## 2018-07-06 ENCOUNTER — Ambulatory Visit (HOSPITAL_COMMUNITY)
Admission: RE | Admit: 2018-07-06 | Discharge: 2018-07-06 | Disposition: A | Discharge status: Home / Self Care | Payer: Medicare Other | Source: Ambulatory Visit | Attending: Internal Medicine | Admitting: Internal Medicine

## 2018-07-06 DIAGNOSIS — L97429 Non-pressure chronic ulcer of left heel and midfoot with unspecified severity: Secondary | ICD-10-CM | POA: Diagnosis not present

## 2018-07-06 DIAGNOSIS — L97529 Non-pressure chronic ulcer of other part of left foot with unspecified severity: Secondary | ICD-10-CM | POA: Diagnosis not present

## 2018-07-08 ENCOUNTER — Telehealth: Payer: Self-pay

## 2018-07-08 LAB — AEROBIC CULTURE W GRAM STAIN (SUPERFICIAL SPECIMEN)

## 2018-07-08 NOTE — Telephone Encounter (Signed)
John Griffin is a 68 y.o. male reports to clinical pharmacist appointment for COPD medication management. Patient did not bring medication.   Patient's current COPD medication regimen consists of: Spiriva and albuterol, patient correctly reports how to use each inhaler.   Patient denies COPD related symptoms.  Patient reports use of rescue inhaler with an estimated use at about 1 times per week.   Patient denies occurrence of a COPD exacerbation since last appointment.   Patient reports a history of smoking with a current pack-per year of 1 pack per day  No Known Allergies  Current Outpatient Medications:  .  acetaminophen (TYLENOL) 500 MG tablet, Take 1,000 mg by mouth every 6 (six) hours as needed for mild pain. Reported on 05/09/2015, Disp: , Rfl:  .  albuterol (PROVENTIL HFA;VENTOLIN HFA) 108 (90 Base) MCG/ACT inhaler, Inhale 2 puffs into the lungs every 4 (four) hours as needed for wheezing or shortness of breath., Disp: 18 g, Rfl: 3 .  aspirin 81 MG chewable tablet, Chew 1 tablet (81 mg total) by mouth daily., Disp: , Rfl:  .  atorvastatin (LIPITOR) 80 MG tablet, Take 1 tablet (80 mg total) by mouth daily., Disp: 90 tablet, Rfl: 3 .  calcium carbonate (CALCIUM 600) 1500 (600 Ca) MG TABS tablet, Take 1,200 mg of elemental calcium by mouth daily. , Disp: , Rfl:  .  calcium carbonate (TUMS EX) 750 MG chewable tablet, Chew 1 tablet by mouth as needed for heartburn. , Disp: , Rfl:  .  cetirizine (ZYRTEC) 10 MG tablet, Take 10 mg by mouth daily. , Disp: , Rfl:  .  Chlorpheniramine-DM (CORICIDIN HBP COUGH/COLD PO), Take 1 tablet by mouth at bedtime as needed (congestion)., Disp: , Rfl:  .  Cholecalciferol (D3 HIGH POTENCY) 2000 units CAPS, Take 2,000 Units by mouth daily. , Disp: , Rfl:  .  clopidogrel (PLAVIX) 75 MG tablet, TAKE 1 TABLET BY MOUTH EVERY DAY, Disp: 90 tablet, Rfl: 3 .  fluticasone (FLONASE) 50 MCG/ACT nasal spray, Place 2 sprays into both nostrils daily. , Disp: , Rfl:  .   furosemide (LASIX) 20 MG tablet, TAKE 1 TABLET BY MOUTH EVERY DAY, Disp: 90 tablet, Rfl: 3 .  isosorbide mononitrate (IMDUR) 30 MG 24 hr tablet, TAKE 1 TABLET (30 MG TOTAL) BY MOUTH DAILY., Disp: 90 tablet, Rfl: 2 .  KLOR-CON M10 10 MEQ tablet, TAKE 1 TABLET BY MOUTH EVERY DAY, Disp: 90 tablet, Rfl: 1 .  losartan (COZAAR) 25 MG tablet, TAKE 1 TABLET BY MOUTH EVERY DAY, Disp: 90 tablet, Rfl: 1 .  metoprolol tartrate (LOPRESSOR) 25 MG tablet, TAKE 1 TABLET BY MOUTH TWICE A DAY, Disp: 180 tablet, Rfl: 3 .  nitroGLYCERIN (NITROSTAT) 0.4 MG SL tablet, Place 1 tablet (0.4 mg total) under the tongue every 5 (five) minutes x 3 doses as needed for chest pain., Disp: 25 tablet, Rfl: 2 .  tiotropium (SPIRIVA HANDIHALER) 18 MCG inhalation capsule, Place 1 capsule (18 mcg total) into inhaler and inhale daily., Disp: 30 capsule, Rfl: 2 Past Medical History:  Diagnosis Date  . Arthritis    hands  and shoulders  . CAD (coronary artery disease)    a. 01/2016: NSTEMI with cath showing 100% Prox Cx stenosis, 70% Prox LAD, 75% 1st Diag, and 60-70% RCA stenosis with medical therapy pursued as the LCx had collateral flow noted  . COPD (chronic obstructive pulmonary disease) ([place])   . Dyspnea   . Hypertension   . Myocardial infarction ([place])    01/2016  .  Pneumonia   . Prostate cancer [facility])    s/p prostatectomy  . Sleep apnea    cpap  . Tobacco use   . Umbilical hernia   . Urinary frequency    Social History   Socioeconomic History  . Marital status: Married    Spouse name: Not on file  . Number of children: Not on file  . Years of education: Not on file  . Highest education level: Not on file  Occupational History  . Not on file  Social Needs  . Financial resource strain: Not on file  . Food insecurity    Worry: Not on file    Inability: Not on file  . Transportation needs    Medical: Not on file    Non-medical: Not on file  Tobacco Use  . Smoking status: Current Every Day Smoker    Packs/day:  1.00    Years: 47.00    Pack years: 47.00    Types: Cigarettes  . Smokeless tobacco: Never Used  . Tobacco comment: back to smoking a 1 ppd since 2018  Substance and Sexual Activity  . Alcohol use: Yes    Alcohol/week: 0.0 standard drinks    Comment: occasional   . Drug use: No  . Sexual activity: Yes  Lifestyle  . Physical activity    Days per week: Not on file    Minutes per session: Not on file  . Stress: Not on file  Relationships  . Social Herbalist on phone: Not on file    Gets together: Not on file    Attends religious service: Not on file    Active member of club or organization: Not on file    Attends meetings of clubs or organizations: Not on file    Relationship status: Not on file  Other Topics Concern  . 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 [name] with [facility]   Family History  Problem Relation Age of Onset  . Dementia Mother   . Heart attack Father 53  . CAD Father        s/p cabg    O: Ht Readings from Last 2 Encounters:  03/16/18 6\' 4"  (1.93 m)  03/10/18 6\' 4"  (1.93 m)   Wt Readings from Last 2 Encounters:  03/16/18 (!) 332 lb (150.6 kg)  03/10/18 (!) 332 lb (150.6 kg)   There is no height or weight on file to calculate BMI.  CAT ASSESSMENT  Rank each of the following items on a scale of 0 to 5 (with 5 being most severe) Write a # 0-5 in each box  I never cough (0) > I cough all the time (5) 2  I have no phlegm (mucus) in my chest (0) > My chest is completely full of phlegm (mucus) (5) 3  My chest does not feel tight at all (0) > My chest feels very tight (5) 1  When I walk up a hill or one flight of stairs I am not breathless (0) > When I walk up a hill or one flight of stairs I am very breathless (5) 3  I am not limited doing any activities at home (0) > I am very limited doing activities at home (5) 3   I am confident  leaving my home despite my lung function (0) > I am not at  all confident leaving my home because of my lung condition (5)  0  I sleep soundly (0) > I don't sleep soundly because of my lung condition (5) 0   I have lots of energy (0) > I have no energy at all (5) 3     Total CAT Score: 15 Most recent blood eosinophil count was 0 cells/microL taken on 2017.  A/P:  Findings/Recommendations:  . No changes to patient meds. His score improved from last time. . Counseled patient to report any changes in COPD medications, breathing, or shortness of breath.   Will follow up in 4-6 weeks.

## 2018-07-12 DIAGNOSIS — L97422 Non-pressure chronic ulcer of left heel and midfoot with fat layer exposed: Secondary | ICD-10-CM | POA: Diagnosis not present

## 2018-07-12 DIAGNOSIS — J449 Chronic obstructive pulmonary disease, unspecified: Secondary | ICD-10-CM | POA: Diagnosis not present

## 2018-07-12 DIAGNOSIS — G9009 Other idiopathic peripheral autonomic neuropathy: Secondary | ICD-10-CM | POA: Diagnosis not present

## 2018-07-12 DIAGNOSIS — B9689 Other specified bacterial agents as the cause of diseases classified elsewhere: Secondary | ICD-10-CM | POA: Diagnosis not present

## 2018-07-12 DIAGNOSIS — R7303 Prediabetes: Secondary | ICD-10-CM | POA: Diagnosis not present

## 2018-07-12 DIAGNOSIS — I2510 Atherosclerotic heart disease of native coronary artery without angina pectoris: Secondary | ICD-10-CM | POA: Diagnosis not present

## 2018-07-13 DIAGNOSIS — R7309 Other abnormal glucose: Secondary | ICD-10-CM | POA: Diagnosis not present

## 2018-07-13 DIAGNOSIS — E782 Mixed hyperlipidemia: Secondary | ICD-10-CM | POA: Diagnosis not present

## 2018-07-13 DIAGNOSIS — Z79899 Other long term (current) drug therapy: Secondary | ICD-10-CM | POA: Diagnosis not present

## 2018-07-13 DIAGNOSIS — I10 Essential (primary) hypertension: Secondary | ICD-10-CM | POA: Diagnosis not present

## 2018-07-13 DIAGNOSIS — R7303 Prediabetes: Secondary | ICD-10-CM | POA: Diagnosis not present

## 2018-07-19 DIAGNOSIS — L97422 Non-pressure chronic ulcer of left heel and midfoot with fat layer exposed: Secondary | ICD-10-CM | POA: Diagnosis not present

## 2018-07-19 DIAGNOSIS — B9689 Other specified bacterial agents as the cause of diseases classified elsewhere: Secondary | ICD-10-CM | POA: Diagnosis not present

## 2018-07-19 DIAGNOSIS — R7303 Prediabetes: Secondary | ICD-10-CM | POA: Diagnosis not present

## 2018-07-19 DIAGNOSIS — S91302A Unspecified open wound, left foot, initial encounter: Secondary | ICD-10-CM | POA: Diagnosis not present

## 2018-07-19 DIAGNOSIS — G9009 Other idiopathic peripheral autonomic neuropathy: Secondary | ICD-10-CM | POA: Diagnosis not present

## 2018-07-19 DIAGNOSIS — J449 Chronic obstructive pulmonary disease, unspecified: Secondary | ICD-10-CM | POA: Diagnosis not present

## 2018-07-19 DIAGNOSIS — I2510 Atherosclerotic heart disease of native coronary artery without angina pectoris: Secondary | ICD-10-CM | POA: Diagnosis not present

## 2018-07-26 ENCOUNTER — Encounter (HOSPITAL_BASED_OUTPATIENT_CLINIC_OR_DEPARTMENT_OTHER): Payer: Medicare Other | Attending: Internal Medicine

## 2018-07-26 DIAGNOSIS — I2510 Atherosclerotic heart disease of native coronary artery without angina pectoris: Secondary | ICD-10-CM | POA: Diagnosis not present

## 2018-07-26 DIAGNOSIS — R7303 Prediabetes: Secondary | ICD-10-CM | POA: Insufficient documentation

## 2018-07-26 DIAGNOSIS — L84 Corns and callosities: Secondary | ICD-10-CM | POA: Diagnosis not present

## 2018-07-26 DIAGNOSIS — M2170 Unequal limb length (acquired), unspecified site: Secondary | ICD-10-CM | POA: Insufficient documentation

## 2018-07-26 DIAGNOSIS — Z7984 Long term (current) use of oral hypoglycemic drugs: Secondary | ICD-10-CM | POA: Diagnosis not present

## 2018-07-26 DIAGNOSIS — L97422 Non-pressure chronic ulcer of left heel and midfoot with fat layer exposed: Secondary | ICD-10-CM | POA: Insufficient documentation

## 2018-07-26 DIAGNOSIS — J449 Chronic obstructive pulmonary disease, unspecified: Secondary | ICD-10-CM | POA: Diagnosis not present

## 2018-07-26 DIAGNOSIS — G609 Hereditary and idiopathic neuropathy, unspecified: Secondary | ICD-10-CM | POA: Diagnosis not present

## 2018-07-26 DIAGNOSIS — G4730 Sleep apnea, unspecified: Secondary | ICD-10-CM | POA: Insufficient documentation

## 2018-07-26 DIAGNOSIS — I252 Old myocardial infarction: Secondary | ICD-10-CM | POA: Insufficient documentation

## 2018-07-26 DIAGNOSIS — R260 Ataxic gait: Secondary | ICD-10-CM | POA: Diagnosis not present

## 2018-07-26 DIAGNOSIS — I10 Essential (primary) hypertension: Secondary | ICD-10-CM | POA: Diagnosis not present

## 2018-07-26 DIAGNOSIS — G9009 Other idiopathic peripheral autonomic neuropathy: Secondary | ICD-10-CM | POA: Diagnosis not present

## 2018-08-02 DIAGNOSIS — R7303 Prediabetes: Secondary | ICD-10-CM | POA: Diagnosis not present

## 2018-08-02 DIAGNOSIS — L97422 Non-pressure chronic ulcer of left heel and midfoot with fat layer exposed: Secondary | ICD-10-CM | POA: Diagnosis not present

## 2018-08-02 DIAGNOSIS — G4730 Sleep apnea, unspecified: Secondary | ICD-10-CM | POA: Diagnosis not present

## 2018-08-02 DIAGNOSIS — M2170 Unequal limb length (acquired), unspecified site: Secondary | ICD-10-CM | POA: Diagnosis not present

## 2018-08-02 DIAGNOSIS — G609 Hereditary and idiopathic neuropathy, unspecified: Secondary | ICD-10-CM | POA: Diagnosis not present

## 2018-08-02 DIAGNOSIS — G9009 Other idiopathic peripheral autonomic neuropathy: Secondary | ICD-10-CM | POA: Diagnosis not present

## 2018-08-02 DIAGNOSIS — J449 Chronic obstructive pulmonary disease, unspecified: Secondary | ICD-10-CM | POA: Diagnosis not present

## 2018-08-04 ENCOUNTER — Other Ambulatory Visit: Payer: Self-pay

## 2018-08-04 ENCOUNTER — Ambulatory Visit (INDEPENDENT_AMBULATORY_CARE_PROVIDER_SITE_OTHER): Payer: Medicare Other | Admitting: Adult Health

## 2018-08-04 ENCOUNTER — Encounter: Payer: Self-pay | Admitting: Adult Health

## 2018-08-04 DIAGNOSIS — F17210 Nicotine dependence, cigarettes, uncomplicated: Secondary | ICD-10-CM

## 2018-08-04 DIAGNOSIS — Z720 Tobacco use: Secondary | ICD-10-CM

## 2018-08-04 DIAGNOSIS — I2510 Atherosclerotic heart disease of native coronary artery without angina pectoris: Secondary | ICD-10-CM | POA: Diagnosis not present

## 2018-08-04 DIAGNOSIS — J449 Chronic obstructive pulmonary disease, unspecified: Secondary | ICD-10-CM | POA: Diagnosis not present

## 2018-08-04 DIAGNOSIS — G4733 Obstructive sleep apnea (adult) (pediatric): Secondary | ICD-10-CM | POA: Diagnosis not present

## 2018-08-04 MED ORDER — SPIRIVA HANDIHALER 18 MCG IN CAPS: 18.0000 ug | ORAL_CAPSULE | Freq: Every day | RESPIRATORY_TRACT | 11 refills | Status: DC

## 2018-08-04 MED ORDER — ATORVASTATIN CALCIUM 80 MG PO TABS: 80.0000 mg | ORAL_TABLET | Freq: Every day | ORAL | 3 refills | Status: DC

## 2018-08-04 NOTE — Assessment & Plan Note (Signed)
Appears compensated.  Smoking cessation is key  Plan  Patient Instructions  Great Job , keep up good work  Continue on CPAP At bedtime   Saline nasal rinses Twice daily  As needed   Saline nasal gel At bedtime  .  Work on healthy weight  Do not drive if sleepy.  Continue on Spiriva daily  Work on not smoking  Continue with LDCT chest screening program.  Follow up with Dr. Elsworth Soho  In 6 months and As needed.

## 2018-08-04 NOTE — Assessment & Plan Note (Signed)
Excellent control and compliance on CPAP  Plan  Patient Instructions  Great Job , keep up good work  Continue on CPAP At bedtime   Saline nasal rinses Twice daily  As needed   Saline nasal gel At bedtime  .  Work on healthy weight  Do not drive if sleepy.  Continue on Spiriva daily  Work on not smoking  Continue with LDCT chest screening program.  Follow up with Dr. Elsworth Soho  In 6 months and As needed.

## 2018-08-04 NOTE — Patient Instructions (Addendum)
Great Job , keep up good work  Continue on CPAP At bedtime   Saline nasal rinses Twice daily  As needed   Saline nasal gel At bedtime  .  Work on healthy weight  Do not drive if sleepy.  Continue on Spiriva daily  Work on not smoking  Continue with LDCT chest screening program.  Follow up with Dr. Elsworth Soho  In 6 months and As needed.

## 2018-08-04 NOTE — Assessment & Plan Note (Signed)
Smoking cessation  

## 2018-08-04 NOTE — Progress Notes (Signed)
@Patient  ID: John Griffin, male    DOB: 11/01/1950, 68 y.o.   MRN: [id number]  Chief Complaint  Patient presents with  . Follow-up    COPD     Referring provider: Christain Sacramento, MD  HPI: 68 yo male active smoker followed for COPD and OSA   TEST/EVENTS :  Spirometry 11/2017 shows drop in FEV1 to 50% with ratio 82 PFT (04/2015) FEV1 2.30 56%. DLCO 66% HST (06/2015) AHI 44.  08/04/2018 Follow up : COPD and OSA  Patient presents for a 54-month follow-up.  Patient has moderate to severe COPD.  Unfortunately continues to smoke.  He was recently switched from Symbicort to Spiriva.  Says that he feels it is working well.  Overall he says his breathing is doing okay he has no flare of cough or shortness of breath.  Says he does get winded with some activity but is very sedentary.  He has had a left foot wound for several months and has not been able to be active.  He is going to the wound center. Smoking cessation was discussed.  Our pharmacy team discussed with him for 15 minutes smoking cessation options.  He wants to hold off for right now but will consider this going forward.  Patient has underlying severe sleep apnea.  He says he is doing well on CPAP.  He says he cannot go to bed without it.  He says he is tolerating well with no significant daytime sleepiness.  Feels that he benefits from CPAP.  CPAP download shows excellent compliance with daily average usage around 11 hours.  Patient is on CPAP AutoSet 10 to 15 cm H2O.  AHI 0.6.  Patient had low-dose CT screening January 2020 that showed lung RADS category 2 with benign appearance.  Plans for follow-up with low-dose CT chest in January 2021  No Known Allergies  Immunization History  Administered Date(s) Administered  . Influenza, High Dose Seasonal PF 10/24/2015, 02/23/2017, 12/04/2017  . Influenza,inj,Quad PF,6+ Mos 02/28/2015  . Pneumococcal Conjugate-13 02/25/2016  . Pneumococcal Polysaccharide-23 02/28/2015    Past  Medical History:  Diagnosis Date  . Arthritis    hands  and shoulders  . CAD (coronary artery disease)    a. 01/2016: NSTEMI with cath showing 100% Prox Cx stenosis, 70% Prox LAD, 75% 1st Diag, and 60-70% RCA stenosis with medical therapy pursued as the LCx had collateral flow noted  . COPD (chronic obstructive pulmonary disease) ([facility])   . Dyspnea   . Hypertension   . Myocardial infarction ([place])    01/2016  . Pneumonia   . Prostate cancer [facility])    s/p prostatectomy  . Sleep apnea    cpap  . Tobacco use   . Umbilical hernia   . Urinary frequency     Tobacco History: Social History   Tobacco Use  Smoking Status Current Every Day Smoker  . Packs/day: 1.00  . Years: 47.00  . Pack years: 47.00  . Types: Cigarettes  Smokeless Tobacco Never Used  Tobacco Comment   back to smoking a 1 ppd since 2018   Ready to quit: Yes Counseling given: Yes Comment: back to smoking a 1 ppd since 2018   Outpatient Medications Prior to Visit  Medication Sig Dispense Refill  . acetaminophen (TYLENOL) 500 MG tablet Take 1,000 mg by mouth every 6 (six) hours as needed for mild pain. Reported on 05/09/2015    . albuterol (PROVENTIL HFA;VENTOLIN HFA) 108 (90 Base) MCG/ACT inhaler Inhale 2 puffs into  the lungs every 4 (four) hours as needed for wheezing or shortness of breath. 18 g 3  . aspirin 81 MG chewable tablet Chew 1 tablet (81 mg total) by mouth daily.    Marland Kitchen atorvastatin (LIPITOR) 80 MG tablet Take 1 tablet (80 mg total) by mouth daily. 90 tablet 3  . calcium carbonate (CALCIUM 600) 1500 (600 Ca) MG TABS tablet Take 1,200 mg of elemental calcium by mouth daily.     . calcium carbonate (TUMS EX) 750 MG chewable tablet Chew 1 tablet by mouth as needed for heartburn.     . cetirizine (ZYRTEC) 10 MG tablet Take 10 mg by mouth daily.     . Chlorpheniramine-DM (CORICIDIN HBP COUGH/COLD PO) Take 1 tablet by mouth at bedtime as needed (congestion).    . Cholecalciferol (D3 HIGH POTENCY) 2000 units CAPS  Take 2,000 Units by mouth daily.     . clopidogrel (PLAVIX) 75 MG tablet TAKE 1 TABLET BY MOUTH EVERY DAY 90 tablet 3  . fluticasone (FLONASE) 50 MCG/ACT nasal spray Place 2 sprays into both nostrils daily.     . furosemide (LASIX) 20 MG tablet TAKE 1 TABLET BY MOUTH EVERY DAY 90 tablet 3  . isosorbide mononitrate (IMDUR) 30 MG 24 hr tablet TAKE 1 TABLET (30 MG TOTAL) BY MOUTH DAILY. 90 tablet 2  . KLOR-CON M10 10 MEQ tablet TAKE 1 TABLET BY MOUTH EVERY DAY 90 tablet 1  . losartan (COZAAR) 25 MG tablet TAKE 1 TABLET BY MOUTH EVERY DAY 90 tablet 1  . metoprolol tartrate (LOPRESSOR) 25 MG tablet TAKE 1 TABLET BY MOUTH TWICE A DAY 180 tablet 3  . nitroGLYCERIN (NITROSTAT) 0.4 MG SL tablet Place 1 tablet (0.4 mg total) under the tongue every 5 (five) minutes x 3 doses as needed for chest pain. 25 tablet 2  . tiotropium (SPIRIVA HANDIHALER) 18 MCG inhalation capsule Place 1 capsule (18 mcg total) into inhaler and inhale daily. 30 capsule 2   No facility-administered medications prior to visit.      Review of Systems:   Constitutional:   No  weight loss, night sweats,  Fevers, chills,  +fatigue, or  lassitude.  HEENT:   No headaches,  Difficulty swallowing,  Tooth/dental problems, or  Sore throat,                No sneezing, itching, ear ache, nasal congestion, post nasal drip,   CV:  No chest pain,  Orthopnea, PND, swelling in lower extremities, anasarca, dizziness, palpitations, syncope.   GI  No heartburn, indigestion, abdominal pain, nausea, vomiting, diarrhea, change in bowel habits, loss of appetite, bloody stools.   Resp:  No excess mucus, no productive cough,  No non-productive cough,  No coughing up of blood.  No change in color of mucus.  No wheezing.  No chest wall deformity  Skin: no rash or lesions.  GU: no dysuria, change in color of urine, no urgency or frequency.  No flank pain, no hematuria   MS: left foot wound, splint    Physical Exam  BP 110/70 (BP Location: Left  Arm, Patient Position: Sitting, Cuff Size: Large)   Pulse 92   Temp 97.8 F (36.6 C) (Oral)   Ht 6\' 4"  (1.93 m)   Wt (!) 330 lb 6.4 oz (149.9 kg)   SpO2 95%   BMI 40.22 kg/m   GEN: A/Ox3; pleasant , NAD, elderly, obese   HEENT:  [place]/AT,   NOSE-clear, THROAT-clear, no lesions, no postnasal drip or exudate  noted.   NECK:  Supple w/ fair ROM; no JVD; normal carotid impulses w/o bruits; no thyromegaly or nodules palpated; no lymphadenopathy.    RESP  Clear  P & A; w/o, wheezes/ rales/ or rhonchi. no accessory muscle use, no dullness to percussion  CARD:  RRR, no m/r/g, no peripheral edema, pulses intact, no cyanosis or clubbing.  GI:   Soft & nt; nml bowel sounds; no organomegaly or masses detected.   Musco: Warm bil, no deformities or joint swelling noted.  Left foot splint  Neuro: alert, no focal deficits noted.    Skin: Warm, no lesions or rashes    Lab Results:  CBC    Component Value Date/Time   WBC 10.6 05/08/2017 0942   WBC 14.3 (H) 02/05/2016 0224   RBC 5.18 05/08/2017 0942   RBC 4.70 02/05/2016 0224   HGB 16.4 05/08/2017 0942   HCT 47.7 05/08/2017 0942   PLT 235 05/08/2017 0942   MCV 92 05/08/2017 0942   MCH 31.7 05/08/2017 0942   MCH 31.9 02/05/2016 0224   MCHC 34.4 05/08/2017 0942   MCHC 33.9 02/05/2016 0224   RDW 13.5 05/08/2017 0942   LYMPHSABS 1.2 02/24/2015 1221   MONOABS 1.9 (H) 02/24/2015 1221   EOSABS 0.0 02/24/2015 1221   BASOSABS 0.0 02/24/2015 1221    BMET    Component Value Date/Time   NA 141 05/08/2017 0942   K 4.0 05/08/2017 0942   CL 104 05/08/2017 0942   CO2 23 05/08/2017 0942   GLUCOSE 124 (H) 05/08/2017 0942   GLUCOSE 160 (H) 02/06/2016 0451   BUN 11 05/08/2017 0942   CREATININE 0.68 (L) 05/08/2017 0942   CALCIUM 9.4 05/08/2017 0942   GFRNONAA 99 05/08/2017 0942   GFRAA 114 05/08/2017 0942    BNP    Component Value Date/Time   BNP 255.5 (H) 02/06/2016 0451    ProBNP No results found for: PROBNP  Imaging: Dg Foot  Complete Left  Result Date: 07/06/2018 CLINICAL DATA:  Nonhealing left heel ulcer for 1 year. EXAM: LEFT FOOT - COMPLETE 3+ VIEW COMPARISON:  Radiographs 11/19/2017 FINDINGS: Soft tissue ulcer over the plantar aspect of the calcaneus again noted with overlying bandages/ointment. No underlying bone destruction or other foreign body identified. There is no evidence of acute fracture or dislocation. Advanced degenerative changes are again noted at the 1st metatarsophalangeal joint. IMPRESSION: No acute osseous findings or radiographic evidence of osteomyelitis. Plantar heel ulcer, similar to prior exam. Electronically Signed   By: Richardean Sale M.D.   On: 07/06/2018 14:15      PFT Results Latest Ref Rng & Units 05/03/2015  FVC-Predicted Pre % 54  FVC-Post L 3.21  FVC-Predicted Post % 58  Pre FEV1/FVC % % 77  Post FEV1/FCV % % 73  FEV1-Pre L 2.30  FEV1-Predicted Pre % 56  FEV1-Post L 2.33  DLCO UNC% % 66  DLCO COR %Predicted % 98    No results found for: NITRICOXIDE      Assessment & Plan:   Tobacco use Smoking cessation  COPD (chronic obstructive pulmonary disease) (HCC) Appears compensated.  Smoking cessation is key  Plan  Patient Instructions  Great Job , keep up good work  Continue on CPAP At bedtime   Saline nasal rinses Twice daily  As needed   Saline nasal gel At bedtime  .  Work on healthy weight  Do not drive if sleepy.  Continue on Spiriva daily  Work on not smoking  Continue with LDCT chest  screening program.  Follow up with Dr. Elsworth Soho  In 6 months and As needed.        OSA (obstructive sleep apnea) Excellent control and compliance on CPAP  Plan  Patient Instructions  Great Job , keep up good work  Continue on CPAP At bedtime   Saline nasal rinses Twice daily  As needed   Saline nasal gel At bedtime  .  Work on healthy weight  Do not drive if sleepy.  Continue on Spiriva daily  Work on not smoking  Continue with LDCT chest screening program.   Follow up with Dr. Elsworth Soho  In 6 months and As needed.           Rexene Edison, NP 08/04/2018

## 2018-08-04 NOTE — Progress Notes (Signed)
S:  Patient was seen today in a co-visit with Tammy Parrett. Patient was reviewed by clinical pharmacist for assistance with tobacco cessation.  History of smoking: 47 pack-years, 1 pack per day of cigarettes His main trigger at this time is stress due to uncertainty from the pandemic, wife works for the school system and they are currently planning for the school year. Patient states his largest barrier at this time includes his family smoking.   Patient was successful quitting cold [place] 3 years ago after a heart procedure. He states that July/August he started smoking again due to stress similar to what he is experiencing today. He reports not being ready to quit today, but understands/is interested in the health benefits and would like to work on this in the future, possibly at next pulmonary appointment.   A/P: Nicotine dependence: moderate-severe, in a patient who is good candidate for success b/c of history of success and motivation to quit. Patient is between contemplation and preparation.  To be successful, patient feels it would be best to include his wife and is planning to work with her to establish care with [facility]. Provided education to patient on services clinic can provide to help patient be most successful (counseling, pharmacotherapy, strategies). He requested educational information today to share with his wife.  Provided information on 1 800-QUIT NOW support program and advised patient to contact me if questions/concerns arise. Patient verbalized understanding of information by repeating back.

## 2018-08-07 ENCOUNTER — Other Ambulatory Visit: Payer: Self-pay | Admitting: Cardiovascular Disease

## 2018-08-08 ENCOUNTER — Other Ambulatory Visit: Payer: Self-pay | Admitting: Pulmonary Disease

## 2018-08-09 DIAGNOSIS — G609 Hereditary and idiopathic neuropathy, unspecified: Secondary | ICD-10-CM | POA: Diagnosis not present

## 2018-08-09 DIAGNOSIS — J449 Chronic obstructive pulmonary disease, unspecified: Secondary | ICD-10-CM | POA: Diagnosis not present

## 2018-08-09 DIAGNOSIS — L97422 Non-pressure chronic ulcer of left heel and midfoot with fat layer exposed: Secondary | ICD-10-CM | POA: Diagnosis not present

## 2018-08-09 DIAGNOSIS — R7303 Prediabetes: Secondary | ICD-10-CM | POA: Diagnosis not present

## 2018-08-09 DIAGNOSIS — M2170 Unequal limb length (acquired), unspecified site: Secondary | ICD-10-CM | POA: Diagnosis not present

## 2018-08-09 DIAGNOSIS — G9009 Other idiopathic peripheral autonomic neuropathy: Secondary | ICD-10-CM | POA: Diagnosis not present

## 2018-08-09 DIAGNOSIS — G4730 Sleep apnea, unspecified: Secondary | ICD-10-CM | POA: Diagnosis not present

## 2018-08-10 ENCOUNTER — Other Ambulatory Visit: Payer: Self-pay

## 2018-08-10 MED ORDER — FUROSEMIDE 20 MG PO TABS: 20.0000 mg | ORAL_TABLET | Freq: Every day | ORAL | 2 refills | Status: DC

## 2018-08-10 NOTE — Telephone Encounter (Signed)
Rx(s) sent to pharmacy electronically.  

## 2018-08-13 ENCOUNTER — Other Ambulatory Visit: Payer: Self-pay | Admitting: Student

## 2018-08-16 DIAGNOSIS — G4730 Sleep apnea, unspecified: Secondary | ICD-10-CM | POA: Diagnosis not present

## 2018-08-16 DIAGNOSIS — G629 Polyneuropathy, unspecified: Secondary | ICD-10-CM | POA: Diagnosis not present

## 2018-08-16 DIAGNOSIS — L97422 Non-pressure chronic ulcer of left heel and midfoot with fat layer exposed: Secondary | ICD-10-CM | POA: Diagnosis not present

## 2018-08-16 DIAGNOSIS — M2170 Unequal limb length (acquired), unspecified site: Secondary | ICD-10-CM | POA: Diagnosis not present

## 2018-08-16 DIAGNOSIS — G609 Hereditary and idiopathic neuropathy, unspecified: Secondary | ICD-10-CM | POA: Diagnosis not present

## 2018-08-16 DIAGNOSIS — J449 Chronic obstructive pulmonary disease, unspecified: Secondary | ICD-10-CM | POA: Diagnosis not present

## 2018-08-16 DIAGNOSIS — R7303 Prediabetes: Secondary | ICD-10-CM | POA: Diagnosis not present

## 2018-08-23 ENCOUNTER — Encounter (HOSPITAL_BASED_OUTPATIENT_CLINIC_OR_DEPARTMENT_OTHER): Payer: Medicare Other | Attending: Internal Medicine

## 2018-08-23 DIAGNOSIS — R7303 Prediabetes: Secondary | ICD-10-CM | POA: Diagnosis not present

## 2018-08-23 DIAGNOSIS — G4730 Sleep apnea, unspecified: Secondary | ICD-10-CM | POA: Diagnosis not present

## 2018-08-23 DIAGNOSIS — L97422 Non-pressure chronic ulcer of left heel and midfoot with fat layer exposed: Secondary | ICD-10-CM | POA: Diagnosis not present

## 2018-08-23 DIAGNOSIS — J449 Chronic obstructive pulmonary disease, unspecified: Secondary | ICD-10-CM | POA: Insufficient documentation

## 2018-08-23 DIAGNOSIS — G9009 Other idiopathic peripheral autonomic neuropathy: Secondary | ICD-10-CM | POA: Diagnosis not present

## 2018-08-23 DIAGNOSIS — I10 Essential (primary) hypertension: Secondary | ICD-10-CM | POA: Insufficient documentation

## 2018-08-23 DIAGNOSIS — I252 Old myocardial infarction: Secondary | ICD-10-CM | POA: Insufficient documentation

## 2018-08-23 DIAGNOSIS — I2510 Atherosclerotic heart disease of native coronary artery without angina pectoris: Secondary | ICD-10-CM | POA: Diagnosis not present

## 2018-08-26 ENCOUNTER — Telehealth: Payer: Self-pay | Admitting: Pulmonary Disease

## 2018-08-26 NOTE — Telephone Encounter (Signed)
[place] [facility] [place] PA request was started via [email or web address]. Key is [id number]. Per CMM, determination will be made in 1-3 business days.

## 2018-08-30 ENCOUNTER — Other Ambulatory Visit: Payer: Self-pay | Admitting: *Deleted

## 2018-08-30 DIAGNOSIS — L97422 Non-pressure chronic ulcer of left heel and midfoot with fat layer exposed: Secondary | ICD-10-CM | POA: Diagnosis not present

## 2018-08-30 DIAGNOSIS — G9009 Other idiopathic peripheral autonomic neuropathy: Secondary | ICD-10-CM | POA: Diagnosis not present

## 2018-08-30 DIAGNOSIS — J449 Chronic obstructive pulmonary disease, unspecified: Secondary | ICD-10-CM | POA: Diagnosis not present

## 2018-08-30 DIAGNOSIS — G4730 Sleep apnea, unspecified: Secondary | ICD-10-CM | POA: Diagnosis not present

## 2018-08-30 DIAGNOSIS — I10 Essential (primary) hypertension: Secondary | ICD-10-CM | POA: Diagnosis not present

## 2018-08-30 DIAGNOSIS — R7303 Prediabetes: Secondary | ICD-10-CM | POA: Diagnosis not present

## 2018-08-30 MED ORDER — CLOPIDOGREL BISULFATE 75 MG PO TABS: 75.0000 mg | ORAL_TABLET | Freq: Every day | ORAL | 1 refills | Status: DC

## 2018-08-31 ENCOUNTER — Telehealth: Payer: Self-pay | Admitting: Pharmacist

## 2018-08-31 DIAGNOSIS — J449 Chronic obstructive pulmonary disease, unspecified: Secondary | ICD-10-CM

## 2018-08-31 MED ORDER — INCRUSE ELLIPTA 62.5 MCG/INH IN AEPB: 1.0000 | INHALATION_SPRAY | Freq: Every day | RESPIRATORY_TRACT | 11 refills | Status: DC

## 2018-08-31 NOTE — Progress Notes (Signed)
Patient called requesting assistance with Spiriva affordability ([facility] price quote ~$400). Patient's insurance prefers Incruse (Spiriva non-formulary), prescription sent for Incruse and patient was contacted to notify him of therapy change from Spiriva to Incruse. Patient reports paying high copays this year for Symbicort and Spiriva, so will also apply for Incruse patient assistance program for free access since he will likely qualify based on out of pocket spending and low-income status (household of 2, income $40,450/year). Advised patient to bring pharmacy out-of-pocket spend printout and 1040 form to clinic. Patient verbalized understanding.

## 2018-09-01 NOTE — Progress Notes (Signed)
[place] (Incruse) phone [phone], fax [phone]

## 2018-09-06 ENCOUNTER — Other Ambulatory Visit: Payer: Self-pay

## 2018-09-06 DIAGNOSIS — L97422 Non-pressure chronic ulcer of left heel and midfoot with fat layer exposed: Secondary | ICD-10-CM | POA: Diagnosis not present

## 2018-09-06 DIAGNOSIS — G9009 Other idiopathic peripheral autonomic neuropathy: Secondary | ICD-10-CM | POA: Diagnosis not present

## 2018-09-06 DIAGNOSIS — I10 Essential (primary) hypertension: Secondary | ICD-10-CM | POA: Diagnosis not present

## 2018-09-06 DIAGNOSIS — J449 Chronic obstructive pulmonary disease, unspecified: Secondary | ICD-10-CM | POA: Diagnosis not present

## 2018-09-06 DIAGNOSIS — G4730 Sleep apnea, unspecified: Secondary | ICD-10-CM | POA: Diagnosis not present

## 2018-09-06 DIAGNOSIS — R7303 Prediabetes: Secondary | ICD-10-CM | POA: Diagnosis not present

## 2018-09-06 MED ORDER — POTASSIUM CHLORIDE CRYS ER 10 MEQ PO TBCR: 10.0000 meq | EXTENDED_RELEASE_TABLET | Freq: Every day | ORAL | 1 refills | Status: DC

## 2018-09-07 NOTE — Telephone Encounter (Signed)
Called [employer] to follow up on Incruse patient assistance application and Prescription OOP faxed over. Rep stated she could see the application on file, but not provide me with any information about the status due to my name not being on the application as an approved representative. She advised the only name on file to follow up outside of the patient is Mannie Stabile.   Phone# [phone]  11:23 AM Beatriz Chancellor, CPhT

## 2018-09-08 ENCOUNTER — Telehealth: Payer: Self-pay

## 2018-09-08 MED ORDER — INCRUSE ELLIPTA 62.5 MCG/INH IN AEPB: 1.0000 | INHALATION_SPRAY | Freq: Every day | RESPIRATORY_TRACT | 3 refills | Status: DC

## 2018-09-08 NOTE — Addendum Note (Signed)
Addended by: Forde Dandy on: 09/08/2018 10:34 AM   Modules accepted: Orders

## 2018-09-08 NOTE — Telephone Encounter (Signed)
Called [employer] for update on patient assistance application and was informed that information was missing. Need to send copy of Medicare part D card as well as prescription. Additionally, [facility] cannot accept the pharmacy spend records from [place] without the pharmacist's signature. Called patient to inform him that [facility] would [facility] sign his spend records and fax to [facility]. No answer, LVM

## 2018-09-08 NOTE — Addendum Note (Signed)
Addended by: Forde Dandy on: 09/08/2018 12:42 PM   Modules accepted: Orders

## 2018-09-13 DIAGNOSIS — R7303 Prediabetes: Secondary | ICD-10-CM | POA: Diagnosis not present

## 2018-09-13 DIAGNOSIS — J449 Chronic obstructive pulmonary disease, unspecified: Secondary | ICD-10-CM | POA: Diagnosis not present

## 2018-09-13 DIAGNOSIS — G9009 Other idiopathic peripheral autonomic neuropathy: Secondary | ICD-10-CM | POA: Diagnosis not present

## 2018-09-13 DIAGNOSIS — L97422 Non-pressure chronic ulcer of left heel and midfoot with fat layer exposed: Secondary | ICD-10-CM | POA: Diagnosis not present

## 2018-09-13 DIAGNOSIS — G4730 Sleep apnea, unspecified: Secondary | ICD-10-CM | POA: Diagnosis not present

## 2018-09-13 DIAGNOSIS — I10 Essential (primary) hypertension: Secondary | ICD-10-CM | POA: Diagnosis not present

## 2018-09-15 NOTE — Telephone Encounter (Signed)
Called and informed pt he would need to [facility] to have [facility] fax the out of pocket expense records to [facility]- gave him the fax # and instructions for pharmacist to sign before faxing.   Brenton Grills, Student-PharmD 09/15/2018 9:25 AM

## 2018-09-20 DIAGNOSIS — G9009 Other idiopathic peripheral autonomic neuropathy: Secondary | ICD-10-CM | POA: Diagnosis not present

## 2018-09-20 DIAGNOSIS — L97422 Non-pressure chronic ulcer of left heel and midfoot with fat layer exposed: Secondary | ICD-10-CM | POA: Diagnosis not present

## 2018-09-22 DIAGNOSIS — R7989 Other specified abnormal findings of blood chemistry: Secondary | ICD-10-CM | POA: Diagnosis not present

## 2018-09-22 DIAGNOSIS — Z720 Tobacco use: Secondary | ICD-10-CM | POA: Diagnosis not present

## 2018-09-22 DIAGNOSIS — I2510 Atherosclerotic heart disease of native coronary artery without angina pectoris: Secondary | ICD-10-CM | POA: Diagnosis not present

## 2018-09-22 DIAGNOSIS — G4733 Obstructive sleep apnea (adult) (pediatric): Secondary | ICD-10-CM | POA: Diagnosis not present

## 2018-09-22 DIAGNOSIS — R7303 Prediabetes: Secondary | ICD-10-CM | POA: Insufficient documentation

## 2018-09-22 DIAGNOSIS — J432 Centrilobular emphysema: Secondary | ICD-10-CM | POA: Diagnosis not present

## 2018-09-22 DIAGNOSIS — Z0000 Encounter for general adult medical examination without abnormal findings: Secondary | ICD-10-CM | POA: Diagnosis not present

## 2018-09-22 DIAGNOSIS — I10 Essential (primary) hypertension: Secondary | ICD-10-CM | POA: Diagnosis not present

## 2018-09-22 DIAGNOSIS — Z8546 Personal history of malignant neoplasm of prostate: Secondary | ICD-10-CM | POA: Diagnosis not present

## 2018-09-22 DIAGNOSIS — Z9989 Dependence on other enabling machines and devices: Secondary | ICD-10-CM | POA: Diagnosis not present

## 2018-09-24 NOTE — Telephone Encounter (Signed)
Called patient to confirm he had pharmacy print out signed and faxed to [facility]. Patient advised that he had documents faxed on 09/15/18.  Called [employer], Rep Lanelle Bal advised that she could not provide me an update on the patient's application, as I'm not listed on the application. And the office address did not match their records to verify me to discuss application.  Rep also stated that they would need a new prescription written by a MD, NP, PA.   Delsa Sale, will you please follow up on Wednesday?  Janett Billow-   Patient stated that he will run out of his Spiriva on Tuesday. Rph Amber said to advise patient to come by office to request samples. Can you please provide patient samples while his patient assistance is still pending?  Thanks!  Phone# [phone]  3:10 PM Beatriz Chancellor, CPhT

## 2018-09-28 ENCOUNTER — Telehealth: Payer: Self-pay | Admitting: Adult Health

## 2018-09-28 NOTE — Telephone Encounter (Signed)
Spoke with patient who stated he was told to come to our clinic to pick up samples of Sprivia Respimat (which we do not have in the clinic) based on his conversation with pharmacy tech.   Patient stated he was waiting on [facility] to issue the medication and was told a prescription would be needed. Message sent to pharmacy tech (attached to the message dated 08/31/18) to contact patient and clarify.  Nothing further needed at this time.

## 2018-09-28 NOTE — Telephone Encounter (Addendum)
Called spoke with patient to discuss Spiriva samples.  Per patient and the 09/28/18 phone note, patient has already called the office to request samples but was informed that we do not have any samples of Spiriva Respimat.  Upon inspection of patient's chart, he actually takes Spiriva HandiHaler - of which we do not have any samples of.  Our office is only provided with samples of Spiriva Respimat.  Called [employer] patient assistance and spoke with [name] to check status of patient assistance @ [phone].  The provider on the application is listed as Flossie Dibble PharmD as well as the Rx has Anderson Malta listed as the prescribing provider and signed as such.  Per [name], the Rx cannot be accepted and this is why the patient has yet to be approved.  Per [name], patient was also listed as having Medicare Part D and they still need a copy of pharmacy card.  Will need to ask TP who saw patient last on 7.15.2020 will sign Rx for Incruse for patient assistance.  In the meantime, as TP is not in the office today >> Beth, may patient receive samples of Spiriva Respimat to hold him until his Incruse patient assistance is approved (patient's last dose of his Spiriva HandiHaler was today and it takes him 1 hr to drive to the office)?  Patient will need to be educated on it's use.  Thank you.

## 2018-09-28 NOTE — Telephone Encounter (Signed)
Yes this is fine.

## 2018-09-28 NOTE — Telephone Encounter (Signed)
Message routed to pharmacy team as FYI and for clarification.  Cyndie Chime: This patient came to the clinic 09/28/18 looking for a sample of Spiriva. However, that medication is not on his medication list. Was he supposed to get a sample of the Respimat 1.25 or 2.5 mg? Our office does not carry any samples of the HandiHaler (you can find the list of what we have under the [employer] teams/files/sample log)  The patient was not given a sample on 09/28/18 as there was no way to confirm if what the patient was asking for was correct.  Also, please contact the patient to verify for him which medication he is supposed to get assistance on. Based on the notation it is [employer], but he thinks he is getting help to obtain Spiriva. Thank you.

## 2018-09-29 NOTE — Telephone Encounter (Signed)
Called [employer] [facility] to follow up on Incruse app. Rep stated that she could use Rx from Dr. Maudie Mercury and that the application could be processed today. The patient will receive a letter from [facility] stating if they have been approved for the program or not and then the medication will arrive at the address on file in 10-14 business days after processing.   Brenton Grills, Student-PharmD 09/29/2018 11:12 AM

## 2018-10-01 ENCOUNTER — Other Ambulatory Visit: Payer: Self-pay

## 2018-10-01 ENCOUNTER — Ambulatory Visit (INDEPENDENT_AMBULATORY_CARE_PROVIDER_SITE_OTHER): Payer: Medicare Other | Admitting: Pharmacist

## 2018-10-01 DIAGNOSIS — Z720 Tobacco use: Secondary | ICD-10-CM

## 2018-10-01 DIAGNOSIS — J449 Chronic obstructive pulmonary disease, unspecified: Secondary | ICD-10-CM

## 2018-10-01 DIAGNOSIS — Z79899 Other long term (current) drug therapy: Secondary | ICD-10-CM

## 2018-10-01 NOTE — Progress Notes (Signed)
Pharmacy Note  Subjective:  Patient presents today to the [facility] to see pharmacy team regarding samples. Pt states he feels all of his medications are affordable except for the Incruse Ellipta. Pt is a current smoker and states he would be willing to quit smoking if his wife quit smoking. However, her job is stressful considering she is a Pharmacist, hospital in the midst of COVID-19. Pt's smoking triggers include smoking in the car when he drives his wife to work.  Exacerbation history (past year): none Hospitalization history (past year): none Pneumonia history: last episode in 2017 Current pulmonology meds: Umeclidinium (Incruse Ellipta), albuterol prn  Prior pulmonology meds: budesonide/formoterol (Symbicort) (weight gain), mometasone/formoterol ([place]), tiotropium (Spiriva Handihaler) (cost)  Objective: PFTs 11/2017 shows drop in FEV1 to 50% with ratio 82 PFT (04/2015) FEV1 2.30 56%.  DLCO 66% HST (06/2015) AHI 44  Current Outpatient Medications on File Prior to Visit  Medication Sig Dispense Refill  . acetaminophen (TYLENOL) 500 MG tablet Take 1,000 mg by mouth every 6 (six) hours as needed for mild pain. Reported on 05/09/2015    . albuterol (PROVENTIL HFA;VENTOLIN HFA) 108 (90 Base) MCG/ACT inhaler Inhale 2 puffs into the lungs every 4 (four) hours as needed for wheezing or shortness of breath. 18 g 3  . aspirin 81 MG chewable tablet Chew 1 tablet (81 mg total) by mouth daily.    Marland Kitchen atorvastatin (LIPITOR) 80 MG tablet Take 1 tablet (80 mg total) by mouth daily. 90 tablet 3  . calcium carbonate (CALCIUM 600) 1500 (600 Ca) MG TABS tablet Take 1,200 mg of elemental calcium by mouth daily.     . calcium carbonate (TUMS EX) 750 MG chewable tablet Chew 1 tablet by mouth as needed for heartburn.     . cetirizine (ZYRTEC) 10 MG tablet Take 10 mg by mouth daily.     . Chlorpheniramine-DM (CORICIDIN HBP COUGH/COLD PO) Take 1 tablet by mouth at bedtime as needed (congestion).    . Cholecalciferol  (D3 HIGH POTENCY) 2000 units CAPS Take 2,000 Units by mouth daily.     . clopidogrel (PLAVIX) 75 MG tablet Take 1 tablet (75 mg total) by mouth daily. 90 tablet 1  . fluticasone (FLONASE) 50 MCG/ACT nasal spray Place 2 sprays into both nostrils daily.     . furosemide (LASIX) 20 MG tablet Take 1 tablet (20 mg total) by mouth daily. 90 tablet 2  . isosorbide mononitrate (IMDUR) 30 MG 24 hr tablet TAKE 1 TABLET BY MOUTH EVERY DAY 90 tablet 2  . losartan (COZAAR) 25 MG tablet TAKE 1 TABLET BY MOUTH EVERY DAY 90 tablet 1  . metoprolol tartrate (LOPRESSOR) 25 MG tablet TAKE 1 TABLET BY MOUTH TWICE A DAY 180 tablet 3  . nitroGLYCERIN (NITROSTAT) 0.4 MG SL tablet Place 1 tablet (0.4 mg total) under the tongue every 5 (five) minutes x 3 doses as needed for chest pain. 25 tablet 2  . potassium chloride (KLOR-CON M10) 10 MEQ tablet Take 1 tablet (10 mEq total) by mouth daily. 40 tablet 1  . umeclidinium bromide (INCRUSE ELLIPTA) 62.5 MCG/INH AEPB Inhale 1 puff into the lungs daily. 3 each 3   No current facility-administered medications on file prior to visit.      Assessment/Plan: 1. Inhaler Education Patient was counseled on the purpose, proper use, and adverse effects of Incruse Ellipta inhaler. Patient verbalized understanding.  Reviewed appropriate use of maintenance vs rescue inhalers.  Stressed importance of using maintenance inhaler daily and rescue inhaler only as needed.  Patient verbalized understanding.  Demonstrated proper inhaler technique using Ellipta demo inhaler.  Patient able to demonstrate proper inhaler technique using teach back method. Patient was given sample in office today.  His inhaler was primed and able to administer first dose in office without issue.  Informed pt he was approved for Incruse Ellipta PAP and prescriptions will be mailed to his house. Instructed pt to notify pharmacy team if he does not receive prescription within 10-14 days.   Medication Samples have been  provided to the patient.  Drug name: Incruse Ellipta Strength: 62.5 mcg         Qty: 2  LOT: [id number]    Exp.Date: Jan/2021  Dosing instructions: Use 1 inhalation daily  The patient has been instructed regarding the correct time, dose, and frequency of taking this medication, including desired effects and most common side effects.   2. Smoking Cessation: Pt is not willing to quit smoking at this time.  He has not tried OTC NRT or pharmacologic options such as Wellbutrin/Chantix. Pt stated he would be willing to quit smoking if his wife also quit. Discussed with pt the benefits of changing habits associated with smoking; specifically, discussing with wife not smoking in the car. Pt verbalized understanding.   Plan to follow up with patient as needed for pharmacy-related questions, inhaler education, and/or smoking cessation.   Thank you for involving pharmacy to assist in providing [facility] care.   Drexel Iha, PharmD PGY2 Ambulatory Care Pharmacy Resident  I evaluated the patient with Drexel Iha, PharmD. I am in agreement with the plan of care discussed and noted above.  Mariella Saa, PharmD, [place], [place] Clinical Specialty Pharmacist [phone]  10/01/2018 2:22 PM

## 2018-10-01 NOTE — Telephone Encounter (Signed)
Called [employer], patient's application has been Approved through 09/29/2019. Rep advised patient's 1st shipment should mail out on Tuesday, 10/05/18.  Phone# [phone]  9:08 AM Beatriz Chancellor, CPhT

## 2018-10-04 ENCOUNTER — Other Ambulatory Visit: Payer: Self-pay

## 2018-10-11 ENCOUNTER — Encounter (HOSPITAL_BASED_OUTPATIENT_CLINIC_OR_DEPARTMENT_OTHER): Payer: Medicare Other | Attending: Internal Medicine

## 2018-10-11 ENCOUNTER — Other Ambulatory Visit: Payer: Self-pay

## 2018-10-11 DIAGNOSIS — G9009 Other idiopathic peripheral autonomic neuropathy: Secondary | ICD-10-CM | POA: Diagnosis not present

## 2018-10-11 DIAGNOSIS — R7303 Prediabetes: Secondary | ICD-10-CM | POA: Diagnosis not present

## 2018-10-11 DIAGNOSIS — L97422 Non-pressure chronic ulcer of left heel and midfoot with fat layer exposed: Secondary | ICD-10-CM | POA: Diagnosis not present

## 2018-10-18 DIAGNOSIS — G9009 Other idiopathic peripheral autonomic neuropathy: Secondary | ICD-10-CM | POA: Diagnosis not present

## 2018-10-18 DIAGNOSIS — R7303 Prediabetes: Secondary | ICD-10-CM | POA: Diagnosis not present

## 2018-10-18 DIAGNOSIS — L97422 Non-pressure chronic ulcer of left heel and midfoot with fat layer exposed: Secondary | ICD-10-CM | POA: Diagnosis not present

## 2018-10-21 DIAGNOSIS — R7303 Prediabetes: Secondary | ICD-10-CM | POA: Diagnosis not present

## 2018-10-21 DIAGNOSIS — Z23 Encounter for immunization: Secondary | ICD-10-CM | POA: Diagnosis not present

## 2018-10-25 ENCOUNTER — Other Ambulatory Visit: Payer: Self-pay

## 2018-10-25 ENCOUNTER — Encounter (HOSPITAL_BASED_OUTPATIENT_CLINIC_OR_DEPARTMENT_OTHER): Payer: Medicare Other | Attending: Internal Medicine | Admitting: Internal Medicine

## 2018-10-25 DIAGNOSIS — S91302A Unspecified open wound, left foot, initial encounter: Secondary | ICD-10-CM | POA: Diagnosis not present

## 2018-10-25 DIAGNOSIS — G4730 Sleep apnea, unspecified: Secondary | ICD-10-CM | POA: Diagnosis not present

## 2018-10-25 DIAGNOSIS — S8012XA Contusion of left lower leg, initial encounter: Secondary | ICD-10-CM | POA: Insufficient documentation

## 2018-10-25 DIAGNOSIS — X58XXXA Exposure to other specified factors, initial encounter: Secondary | ICD-10-CM | POA: Insufficient documentation

## 2018-10-25 DIAGNOSIS — I252 Old myocardial infarction: Secondary | ICD-10-CM | POA: Diagnosis not present

## 2018-10-25 DIAGNOSIS — J449 Chronic obstructive pulmonary disease, unspecified: Secondary | ICD-10-CM | POA: Insufficient documentation

## 2018-10-25 DIAGNOSIS — G9009 Other idiopathic peripheral autonomic neuropathy: Secondary | ICD-10-CM | POA: Diagnosis not present

## 2018-10-25 DIAGNOSIS — I2510 Atherosclerotic heart disease of native coronary artery without angina pectoris: Secondary | ICD-10-CM | POA: Insufficient documentation

## 2018-10-25 DIAGNOSIS — R7303 Prediabetes: Secondary | ICD-10-CM | POA: Insufficient documentation

## 2018-10-25 DIAGNOSIS — L97422 Non-pressure chronic ulcer of left heel and midfoot with fat layer exposed: Secondary | ICD-10-CM | POA: Diagnosis not present

## 2018-10-25 DIAGNOSIS — I10 Essential (primary) hypertension: Secondary | ICD-10-CM | POA: Diagnosis not present

## 2018-10-25 NOTE — Progress Notes (Signed)
John Griffin, John Griffin ([id number]) Visit Report for 10/25/2018 HPI Details Patient Name: Date of Service: John Griffin, John Griffin 10/25/2018 8:00 AM Medical Record Number:[id number] Patient Account Number: [id number] Date of Birth/Sex: Treating RN: January 27, 1950 (68 y.o. John Griffin Primary Care Provider: Christain [place] Other Clinician: Referring Provider: Treating Provider/Extender:Robson, Glendale Chard, Letta Moynahan in Treatment: 16 History of Present Illness HPI Description: ADMISSION 11/19/2017 This is a 68 year old man who is not a diabetic. He tells me that 6 months ago he noticed a callused area on his left heel that open into a wound. This is on the plantar aspect of his heel just near the tip of his calcaneus. He says that this is open and closed repetitively over the last 6 months. He recently saw his primary doctor who sent him here. He is been using Neosporin and a Band-Aid. He tells me that he follows with cardiology and he had a hemoglobin A1c but he does not know what it is but he has been told he is not a diabetic. Measurement on the wound was 0.2 x 1.5 with some undermining distally in this oval-shaped horizontally orientated wound. He does not feel any pain He tells me that for an indefinite period of time he has not had much feeling in his feet. He has not had a medical work-up for this. Our intake nurses noted reduced microfilament sensation The patient has a history of COPD, hypercholesterolemia, coronary artery disease, gastroesophageal reflux disease, prostate cancer treated with surgery, obstructive sleep apnea. He tells [place] that he twisted his back last week and he has a back brace he is also using a cane ABI in our clinic was 1.17 on the left 11/26/2017; the patient arrives with his wound looking a lot better. He has been offloading this using a heel offloading boot. looking a lot better. He has been offloading this using a heel offloading boot. Dimensions are a lot better. 12/03/2017; the area on the tip of his heel extending slightly into the plantar  surface has no open area. Using a 5 curette and then a #10 scalpel I removed some callus but there is no open wound. I think this is going to be an offloading issue in a patient who has nondiabetic neuropathy. For now we are going to recommend a heel cup type heel protector and see if this will maintain skin integrity in this area. READMISSION 07/05/2018 Patient returns to clinic with a reopening of the left heel about a month and a half ago. He was in this clinic in the fall with a wound and roughly the same area. He has some form of idiopathic neuropathy not listed as a diabetic. Very ataxic wound. He also has leg length discrepancy secondary to a previous accident he had and according to his wife a lot of the pressure when he takes a step lands on the left heel. They have been using Neosporin more recently some leftover silver collagen. They did go into a heel offloading boot that we gave him last time. Past medical history as noted he is not a diabetic, he has idiopathic peripheral neuropathy, hypertension, coronary artery disease, obstructive sleep apnea on CPAP, prostate CA COPD. He is a heavy smoker ABI in our clinic was 1.05 on the left 6/22; x-ray I did last time showed no evidence of osteomyelitis. A wound culture showed Alcaligenes faecalis. I am not well familiar with this organism although I will try to look it up in more detail. In any case the wound is somewhat worse today with a small probing area off the base of the wound.  Also problematic is that he does not seem to wear the heel offloading boot properly. We have emphasized that his foot has to be all the way forward, otherwise the heel will be on the floor. 6/29; the patient is completing his antibiotics. Depth of this is come in somewhat. He has been to see his primary doctor and diagnosed with prediabetes now on metformin 500 twice daily. 1 would wonder about the etiology of his neuropathy. They are making a better effort to  use the heel offloading boot properly. He has significant gait ataxia I would be concerned about a total contact cast 7/6- Completed cipro, he continues to use the offloading shoe, he does have balance issues with right leg being shorter, has been changing dressing on heel every other day 7/13; he is using his offloading shoe. Some improvement in length and width and depth. However this is a deep punched out wound on the left plantar heel. His right leg is shorter and his balance is ataxic secondary to a neuropathy. He is prediabetic on metformin. It would be difficult to see him in a total contact cast although he assures me along with his wife that they were doing as much pressure relief short of this they can 7/20; he is still in his heel offloading shoe. Absolutely no difference this week. Thick callus skin and subcutaneous tissue around the deep punched out wound. Required an extensive debridement. We have been using Prisma 7/27; he is still on his heel offloading shoe and according to the patient and his wife he is religiously offloading this. He has a little less depth to the wound certainly no change in the surface area. He continues to have thick callus skin and subcutaneous tissue around the wound edge which I continuously have to debride including today. If we can get this to a more superficial area that would be success. He is using silver collagen. He is not a candidate for a total contact cast secondary to gait and balance issues 08/23/18-Patient returns at 2 weeks for his left heel wound. This appears to be slightly bigger in dimension. Less necrotic tissue than before. Continue to use Prisma. 8/10-Patient returns at 1 week to continue care for his left heel wound, this is looking better than before, definitely less necrotic tissue, continue to use Prisma 8/17; left plantar heel. Once again rolled up areas of skin and subcutaneous tissue around the wound. Wound surface is not too  bad. We have been using Prisma 8/24; left plantar heel. Absolutely no change to rolled up skin and subcutaneous tissue around the wound removed with a #5 curette we have been using silver collagen for a long period of time without any improvement although I think this is largely a pressure issue. I have changed him to [facility] today. Again we have talked about a total contact cast although this would add to his already left greater than right leg length discrepancy about an inch and a half according to the patient. 8/31; left plantar heel. Absolutely no change. Circular punched out wound with callus thick subcutaneous tissue around the wound margin. We have been using Hydrofera Blue 9/21; left plantar heel although the surface area of this is not changed the depth seems somewhat better. There is also not as much callus and thick subcutaneous tissue. We have been using Hydrofera Blue. He came in this day with a support on the right foot which is his shorter leg. We are using a heel offloading boot on  the left. 9/28 left plantar heel. Using Hydrofera Blue. We have improvement in measurements but this is still a relatively punched-out area with evidence of excessive pressure 10/5; left plantar heel using Hydrofera Blue surface area measurements are not different however there is certainly less callus less rolled edges around the wound. No debridement today. I am continuing Hydrofera Blue for another week. There is no further option for offloading other than the heel offloading boot that he currently uses Electronic Signature(s) Signed: 10/25/2018 6:11:18 PM By: Linton Ham MD Entered By: Linton Ham on 10/25/2018 08:23:50 -------------------------------------------------------------------------------- Physical Exam Details Patient Name: Date of Service: John Griffin, John Griffin 10/25/2018 8:00 AM Medical Record Number:[id number] Patient Account Number: [id number] Date of Birth/Sex: Treating  RN: 1950-10-17 (68 y.o. John Griffin Primary Care Provider: Christain [place] Other Clinician: Referring Provider: Treating Provider/Extender:Robson, [place]end[place] Chard, Letta Moynahan in Treatment: 16 Constitutional Sitting or standing Blood Pressure is within target range for patient.. Pulse regular and within target range for patient.Marland Kitchen Respirations regular, non-labored and within target range.. Temperature is normal and within the target range for the patient.Marland Kitchen Appears in no distress. Eyes Conjunctivae clear. No discharge.no icterus. Respiratory work of breathing is normal. Cardiovascular Pedal pulses palpable. Integumentary (Hair, Skin) Absolutely no erythema around the wound. Psychiatric appears at normal baseline. Notes Wound exam; improved surface with less callus and rolled edges around the wound. Surface of this looks also a little better. No debridement required this week which in itself is an improvement. There is no surrounding erythema no evidence of infection Electronic Signature(s) Signed: 10/25/2018 6:11:18 PM By: Linton Ham MD Entered By: Linton Ham on 10/25/2018 08:24:57 -------------------------------------------------------------------------------- Physician Orders Details Patient Name: Date of Service: John Griffin, John Griffin 10/25/2018 8:00 AM Medical Record Number:[id number] Patient Account Number: [id number] Date of Birth/Sex: Treating RN: Jan 20, 1951 (68 y.o. John Griffin Primary Care Provider: Christain [place] Other Clinician: Referring Provider: Treating Provider/Extender:Robson, [place]end[place] Chard, Letta Moynahan in Treatment: 16 Verbal / Phone Orders: No Diagnosis Coding ICD-10 Coding Code Description [phone] Non-pressure chronic ulcer of left heel and midfoot with fat layer exposed G90.09 Other idiopathic peripheral autonomic neuropathy R73.03 Prediabetes Follow-up Appointments Return Appointment in 1 week. Dressing Change Frequency Wound #2 Left  Calcaneus Change Dressing every other day. Skin Barriers/Peri-Wound Care Wound #2 Left Calcaneus Moisturizing lotion Wound Cleansing Wound #2 Left Calcaneus May shower and wash wound with soap and water. Primary Wound Dressing Wound #2 Left Calcaneus Hydrofera Blue - classic if available Secondary Dressing Wound #2 Left Calcaneus Kerlix/Rolled Gauze Dry Gauze Heel Cup Other: - felt callous pad (also apply extra felt in sandal) Off-Loading Other: - Gloped heel offloader sandal to left foot - extra felt for padding Electronic Signature(s) Signed: 10/25/2018 6:11:18 PM By: Linton Ham MD Signed: 10/25/2018 6:17:12 PM By: Levan Hurst RN, BSN Entered By: Levan Hurst on 10/25/2018 07:55:35 -------------------------------------------------------------------------------- Problem List Details Patient Name: Date of Service: John Griffin, John Griffin 10/25/2018 8:00 AM Medical Record Number:[id number] Patient Account Number: [id number] Date of Birth/Sex: Treating RN: [birth year] (68 y.o. John Griffin Primary Care Provider: Christain [place] Other Clinician: Referring Provider: Treating Provider/Extender:Robson, Glendale Chard, Jama Flavors Weeks in Treatment: 16 Active Problems ICD-10 Evaluated Encounter Code Description Active Date Today Diagnosis L97.422 Non-pressure chronic ulcer of left heel and midfoot 07/05/2018 No Yes with fat layer exposed G90.09 Other idiopathic peripheral autonomic neuropathy 07/05/2018 No Yes R73.03 Prediabetes 07/19/2018 No Yes Inactive Problems Resolved Problems Electronic Signature(s) Signed: 10/25/2018 6:11:18 PM By: Linton Ham MD Entered By: Linton Ham on 10/25/2018 08:22:38 --------------------------------------------------------------------------------  Progress Note Details Patient Name: Date of Service: John Griffin, John Griffin 10/25/2018 8:00 AM Medical Record Number:[id number] Patient Account Number: [id number] Date of Birth/Sex: Treating  RN: 29-Sep-1950 (68 y.o. John Griffin Primary Care Provider: Christain [place] Other Clinician: Referring Provider: Treating Provider/Extender:Robson, [place]end[place] Chard, Letta Moynahan in Treatment: 16 Subjective History of Present Illness (HPI) ADMISSION 11/19/2017 This is a 67 year old man who is not a diabetic. He tells me that 6 months ago he noticed a callused area on his left heel that open into a wound. This is on the plantar aspect of his heel just near the tip of his calcaneus. He says that this is open and closed repetitively over the last 6 months. He recently saw his primary doctor who sent him here. He is been using Neosporin and a Band-Aid. He tells me that he follows with cardiology and he had a hemoglobin A1c but he does not know what it is but he has been told he is not a diabetic. Measurement on the wound was 0.2 x 1.5 with some undermining distally in this oval-shaped horizontally orientated wound. He does not feel any pain He tells me that for an indefinite period of time he has not had much feeling in his feet. He has not had a medical work-up for this. Our intake nurses noted reduced microfilament sensation The patient has a history of COPD, hypercholesterolemia, coronary artery disease, gastroesophageal reflux disease, prostate cancer treated with surgery, obstructive sleep apnea. He tells [place] that he twisted his back last week and he has a back brace he is also using a cane ABI in our clinic was 1.17 on the left 11/26/2017; the patient arrives with his wound looking a lot better. He has been offloading this using a heel offloading boot. looking a lot better. He has been offloading this using a heel offloading boot. Dimensions are a lot better. 12/03/2017; the area on the tip of his heel extending slightly into the plantar surface has no open area. Using a 5 curette and then a #10 scalpel I removed some callus but there is no open wound. I think this is going to be an offloading issue in a patient who has nondiabetic neuropathy. For now we are going  to recommend a heel cup type heel protector and see if this will maintain skin integrity in this area. READMISSION 07/05/2018 Patient returns to clinic with a reopening of the left heel about a month and a half ago. He was in this clinic in the fall with a wound and roughly the same area. He has some form of idiopathic neuropathy not listed as a diabetic. Very ataxic wound. He also has leg length discrepancy secondary to a previous accident he had and according to his wife a lot of the pressure when he takes a step lands on the left heel. They have been using Neosporin more recently some leftover silver collagen. They did go into a heel offloading boot that we gave him last time. Past medical history as noted he is not a diabetic, he has idiopathic peripheral neuropathy, hypertension, coronary artery disease, obstructive sleep apnea on CPAP, prostate CA COPD. He is a heavy smoker ABI in our clinic was 1.05 on the left 6/22; x-ray I did last time showed no evidence of osteomyelitis. A wound culture showed Alcaligenes faecalis. I am not well familiar with this organism although I will try to look it up in more detail. In any case the wound is somewhat worse today with a small probing area off the base of the wound. Also problematic is that he  does not seem to wear the heel offloading boot properly. We have emphasized that his foot has to be all the way forward, otherwise the heel will be on the floor. 6/29; the patient is completing his antibiotics. Depth of this is come in somewhat. He has been to see his primary doctor and diagnosed with prediabetes now on metformin 500 twice daily. 1 would wonder about the etiology of his neuropathy. They are making a better effort to use the heel offloading boot properly. He has significant gait ataxia I would be concerned about a total contact cast 7/6- Completed cipro, he continues to use the offloading shoe, he does have balance issues with right leg  being shorter, has been changing dressing on heel every other day 7/13; he is using his offloading shoe. Some improvement in length and width and depth. However this is a deep punched out wound on the left plantar heel. His right leg is shorter and his balance is ataxic secondary to a neuropathy. He is prediabetic on metformin. It would be difficult to see him in a total contact cast although he assures me along with his wife that they were doing as much pressure relief short of this they can 7/20; he is still in his heel offloading shoe. Absolutely no difference this week. Thick callus skin and subcutaneous tissue around the deep punched out wound. Required an extensive debridement. We have been using Prisma 7/27; he is still on his heel offloading shoe and according to the patient and his wife he is religiously offloading this. He has a little less depth to the wound certainly no change in the surface area. He continues to have thick callus skin and subcutaneous tissue around the wound edge which I continuously have to debride including today. If we can get this to a more superficial area that would be success. He is using silver collagen. He is not a candidate for a total contact cast secondary to gait and balance issues 08/23/18-Patient returns at 2 weeks for his left heel wound. This appears to be slightly bigger in dimension. Less necrotic tissue than before. Continue to use Prisma. 8/10-Patient returns at 1 week to continue care for his left heel wound, this is looking better than before, definitely less necrotic tissue, continue to use Prisma 8/17; left plantar heel. Once again rolled up areas of skin and subcutaneous tissue around the wound. Wound surface is not too bad. We have been using Prisma 8/24; left plantar heel. Absolutely no change to rolled up skin and subcutaneous tissue around the wound removed with a #5 curette we have been using silver collagen for a long period of time  without any improvement although I think this is largely a pressure issue. I have changed him to [facility] today. Again we have talked about a total contact cast although this would add to his already left greater than right leg length discrepancy about an inch and a half according to the patient. 8/31; left plantar heel. Absolutely no change. Circular punched out wound with callus thick subcutaneous tissue around the wound margin. We have been using Hydrofera Blue 9/21; left plantar heel although the surface area of this is not changed the depth seems somewhat better. There is also not as much callus and thick subcutaneous tissue. We have been using Hydrofera Blue. He came in this day with a support on the right foot which is his shorter leg. We are using a heel offloading boot on the left. 9/28 left plantar  heel. Using Hydrofera Blue. We have improvement in measurements but this is still a relatively punched-out area with evidence of excessive pressure 10/5; left plantar heel using Hydrofera Blue surface area measurements are not different however there is certainly less callus less rolled edges around the wound. No debridement today. I am continuing Hydrofera Blue for another week. There is no further option for offloading other than the heel offloading boot that he currently uses Objective Constitutional Sitting or standing Blood Pressure is within target range for patient.. Pulse regular and within target range for patient.Marland Kitchen Respirations regular, non-labored and within target range.. Temperature is normal and within the target range for the patient.Marland Kitchen Appears in no distress. Vitals Time Taken: 7:54 AM, Height: 76 in, Weight: 335 lbs, BMI: 40.8, Temperature: 98.4 F, Pulse: 80 bpm, Respiratory Rate: 22 breaths/min, Blood Pressure: 122/67 mmHg. Eyes Conjunctivae clear. No discharge.no icterus. Respiratory work of breathing is normal. Cardiovascular Pedal pulses  palpable. Psychiatric appears at normal baseline. General Notes: Wound exam; improved surface with less callus and rolled edges around the wound. Surface of this looks also a little better. No debridement required this week which in itself is an improvement. There is no surrounding erythema no evidence of infection Integumentary (Hair, Skin) Absolutely no erythema around the wound. Wound #2 status is Open. Original cause of wound was Gradually Appeared. The wound is located on the Left Calcaneus. The wound measures 1.4cm length x 1.4cm width x 0.4cm depth; 1.539cm^2 area and 0.616cm^3 volume. There is Fat Layer (Subcutaneous Tissue) Exposed exposed. There is no tunneling or undermining noted. There is a medium amount of serous drainage noted. The wound margin is thickened. There is large (67-100%) pink, pale granulation within the wound bed. There is a small (1-33%) amount of necrotic tissue within the wound bed including Adherent Slough. Assessment Active Problems ICD-10 Non-pressure chronic ulcer of left heel and midfoot with fat layer exposed Other idiopathic peripheral autonomic neuropathy Prediabetes Plan Follow-up Appointments: Return Appointment in 1 week. Dressing Change Frequency: Wound #2 Left Calcaneus: Change Dressing every other day. Skin Barriers/Peri-Wound Care: Wound #2 Left Calcaneus: Moisturizing lotion Wound Cleansing: Wound #2 Left Calcaneus: May shower and wash wound with soap and water. Primary Wound Dressing: Wound #2 Left Calcaneus: Hydrofera Blue - classic if available Secondary Dressing: Wound #2 Left Calcaneus: Kerlix/Rolled Gauze Dry Gauze Heel Cup Other: - felt callous pad (also apply extra felt in sandal) Off-Loading: Other: - Gloped heel offloader sandal to left foot - extra felt for padding 1. No major change in surface area however the condition of the wound and the wound edges look better. I 2. I did not change Hydrofera Blue, consider  endoform 3. The wound did not look like it required debridement today which in itself is the reason for cautious optimism Electronic Signature(s) Signed: 10/25/2018 6:11:18 PM By: Linton Ham MD Entered By: Linton Ham on 10/25/2018 08:28:41 -------------------------------------------------------------------------------- SuperBill Details Patient Name: Date of Service: John Griffin, John Griffin 10/25/2018 Medical Record [id number] Patient Account Number: [id number] Date of Birth/Sex: Treating RN: October 11, 1950 (68 y.o. Marvis Repress Primary Care Provider: Christain [place] Other Clinician: Referring Provider: Treating Provider/Extender:Robson, [place]end[place] Chard, Letta Moynahan in Treatment: 16 Diagnosis Coding ICD-10 Codes Code Description [phone] Non-pressure chronic ulcer of left heel and midfoot with fat layer exposed G90.09 Other idiopathic peripheral autonomic neuropathy R73.03 Prediabetes Facility Procedures CPT4 Code: [id number] Description: 99213 - WOUND CARE VISIT-LEV 3 EST PT Modifier: Quantity: 1 Physician Procedures CPT4 Code Description: [id number] - WC PHYS LEVEL 3 -  EST PT ICD-10 Diagnosis Description L97.422 Non-pressure chronic ulcer of left heel and midfoot with G90.09 Other idiopathic peripheral autonomic neuropathy Modifier: fat layer expose Quantity: 1 d Electronic Signature(s) Signed: 10/25/2018 6:11:18 PM By: Linton Ham MD Entered By: Linton Ham on 10/25/2018 08:28:56

## 2018-10-28 NOTE — Progress Notes (Signed)
John, Griffin ([id number]) Visit Report for 10/25/2018 Arrival Information Details Patient Name: Date of Service: John Griffin, John Griffin 10/25/2018 8:00 AM Medical Record Number:[id number] Patient Account Number: [id number] Date of Birth/Sex: Treating RN: 10/27/1950 (68 y.o. Marvis Repress Primary Care Mckenzi Buonomo: Christain [place] Other Clinician: Referring Luverna Degenhart: Treating Tasmia Blumer/Extender:Robson, Glendale Chard, Letta Moynahan in Treatment: 16 Visit Information History Since Last Visit Added or deleted any medications: No Patient Arrived: Ambulatory Any new allergies or adverse reactions: No Arrival Time: 07:53 Had a fall or experienced change in No Accompanied By: self activities of daily living that may affect Transfer Assistance: None risk of falls: Patient Identification Verified: Yes Signs or symptoms of abuse/neglect since No Secondary Verification Process Yes last visito Completed: Hospitalized since last visit: No Patient Requires Transmission-Based No Implantable device outside of the clinic No Precautions: excluding Patient Has Alerts: No cellular tissue based products placed in the center since last visit: Has Dressing in Place as Prescribed: Yes Has Footwear/Offloading in Place as Yes Prescribed: Left: Wedge Shoe Pain Present Now: No Electronic Signature(s) Signed: 10/28/2018 4:29:59 PM By: Kela Millin Entered By: Kela Millin on 10/25/2018 07:54:51 -------------------------------------------------------------------------------- Clinic Level of Care Assessment Details Patient Name: Date of Service: Griffin, John 10/25/2018 8:00 AM Medical Record Number:[id number] Patient Account Number: [id number] Date of Birth/Sex: Treating RN: 1950/06/05 (68 y.o. Marvis Repress Primary Care Story Conti: Christain [place] Other Clinician: Referring Kitrina Maurin: Treating Chevie Birkhead/Extender:Robson, Glendale Chard, Letta Moynahan in Treatment: 16 Clinic Level of Care  Assessment Items TOOL 4 Quantity Score X - Use when only an EandM is performed on FOLLOW-UP visit 1 0 ASSESSMENTS - Nursing Assessment / Reassessment X - Reassessment of Co-morbidities (includes updates in patient status) 1 10 X - Reassessment of Adherence to Treatment Plan 1 5 ASSESSMENTS - Wound and Skin Assessment / Reassessment X - Simple Wound Assessment / Reassessment - one wound 1 5 []  - Complex Wound Assessment / Reassessment - multiple wounds 0 []  - Dermatologic / Skin Assessment (not related to wound area) 0 ASSESSMENTS - Focused Assessment X - Circumferential Edema Measurements - multi extremities 1 5 []  - Nutritional Assessment / Counseling / Intervention 0 []  - Lower Extremity Assessment (monofilament, tuning fork, pulses) 0 []  - Peripheral Arterial Disease Assessment (using hand held doppler) 0 ASSESSMENTS - Ostomy and/or Continence Assessment and Care []  - Incontinence Assessment and Management 0 []  - Ostomy Care Assessment and Management (repouching, etc.) 0 PROCESS - Coordination of Care X - Simple Patient / Family Education for ongoing care 1 15 []  - Complex (extensive) Patient / Family Education for ongoing care 0 X - Staff obtains Programmer, systems, Records, Test Results / Process Orders 1 10 []  - Staff telephones HHA, Nursing Homes / Clarify orders / etc 0 []  - Routine Transfer to another Facility (non-emergent condition) 0 []  - Routine Hospital Admission (non-emergent condition) 0 []  - New Admissions / Biomedical engineer / Ordering NPWT, Apligraf, etc. 0 []  - Emergency Hospital Admission (emergent condition) 0 X - Simple Discharge Coordination 1 10 []  - Complex (extensive) Discharge Coordination 0 PROCESS - Special Needs []  - Pediatric / Minor Patient Management 0 []  - Isolation Patient Management 0 []  - Hearing / Language / Visual special needs 0 []  - Assessment of Community assistance (transportation, D/C planning, etc.) 0 []  - Additional assistance / Altered  mentation 0 []  - Support Surface(s) Assessment (bed, cushion, seat, etc.) 0 INTERVENTIONS - Wound Cleansing / Measurement X - Simple Wound Cleansing - one wound 1 5 []  - Complex  Wound Cleansing - multiple wounds 0 X - Wound Imaging (photographs - any number of wounds) 1 5 []  - Wound Tracing (instead of photographs) 0 X - Simple Wound Measurement - one wound 1 5 []  - Complex Wound Measurement - multiple wounds 0 INTERVENTIONS - Wound Dressings X - Small Wound Dressing one or multiple wounds 1 10 []  - Medium Wound Dressing one or multiple wounds 0 []  - Large Wound Dressing one or multiple wounds 0 X - Application of Medications - topical 1 5 []  - Application of Medications - injection 0 INTERVENTIONS - Miscellaneous []  - External ear exam 0 []  - Specimen Collection (cultures, biopsies, blood, body fluids, etc.) 0 []  - Specimen(s) / Culture(s) sent or taken to Lab for analysis 0 []  - Patient Transfer (multiple staff / Civil Service fast streamer / Similar devices) 0 []  - Simple Staple / Suture removal (25 or less) 0 []  - Complex Staple / Suture removal (26 or more) 0 []  - Hypo / Hyperglycemic Management (close monitor of Blood Glucose) 0 []  - Ankle / Brachial Index (ABI) - do not check if billed separately 0 X - Vital Signs 1 5 Has the patient been seen at the hospital within the last three years: Yes Total Score: 95 Level Of Care: New/Established - Level 3 Electronic Signature(s) Signed: 10/28/2018 4:29:59 PM By: Kela Millin Entered By: Kela Millin on 10/25/2018 08:23:20 -------------------------------------------------------------------------------- Encounter Discharge Information Details Patient Name: Date of Service: [name], Griffin 10/25/2018 8:00 AM Medical Record [id number] Patient Account Number: [id number] Date of Birth/Sex: Treating RN: [birth year] (68 y.o. [name] Primary Care Eythan Jayne: Christain [place] Other Clinician: Referring Pleasant Britz: Treating  Bryten Maher/Extender:Robson, Glendale Chard, Letta Moynahan in Treatment: 16 Encounter Discharge Information Items Discharge Condition: Stable Ambulatory Status: Ambulatory Discharge Destination: Home Transportation: Private Auto Accompanied By: self Schedule Follow-up Appointment: Yes Clinical Summary of Care: Electronic Signature(s) Signed: 10/25/2018 5:53:08 PM By: Deon Pilling Entered By: Deon Pilling on 10/25/2018 08:38:18 -------------------------------------------------------------------------------- Lower Extremity Assessment Details Patient Name: Date of Service: FILIBERTO, WAMBLE 10/25/2018 8:00 AM Medical Record Number:[id number] Patient Account Number: [id number] Date of Birth/Sex: Treating RN: [birth year] (67 y.o. Marvis Repress Primary Care Raphaella Larkin: Christain [place] Other Clinician: Referring Laakea Pereira: Treating Ulyess Muto/Extender:Robson, Glendale Chard, Jama Flavors Weeks in Treatment: 16 Edema Assessment Assessed: [Left: No] [Right: No] Edema: [Left: Ye] [Right: s] Calf Left: Right: Point of Measurement: 44 cm From Medial Instep cm 41 cm Ankle Left: Right: Point of Measurement: 11 cm From Medial Instep cm 25 cm Vascular Assessment Pulses: Dorsalis Pedis Palpable: [Right:Yes] Electronic Signature(s) Signed: 10/28/2018 4:29:59 PM By: Kela Millin Entered By: Kela Millin on 10/25/2018 08:00:06 -------------------------------------------------------------------------------- Multi Wound Chart Details Patient Name: Date of Service: KYJUAN, GAUSE 10/25/2018 8:00 AM Medical Record Number:[id number] Patient Account Number: [id number] Date of Birth/Sex: Treating RN: 12/05/1950 (68 y.o. [name] Primary Care Bella Brummet: Christain [place] Other Clinician: Referring Zaryia Markel: Treating Adiya Selmer/Extender:Robson, Glendale Chard, Letta Moynahan in Treatment: 16 Vital Signs Height(in): 39 Pulse(bpm): 80 Weight(lbs): 335 Blood Pressure(mmHg): 122/67 Body Mass  Index(BMI): 41 Temperature(F): 98.4 Respiratory 22 Rate(breaths/min): Photos: [2:No Photos] [N/A:N/A] Wound Location: [2:Left Calcaneus] [N/A:N/A] Wounding Event: [2:Gradually Appeared] [N/A:N/A] Primary Etiology: [2:Neuropathic Ulcer-Non Diabetic] [N/A:N/A] Comorbid History: [2:Chronic Obstructive Pulmonary Disease (COPD), Sleep Apnea, Arrhythmia, Coronary Artery Disease, Hypertension, Myocardial Infarction, Osteoarthritis] [N/A:N/A] Date Acquired: [2:01/20/2017] [N/A:N/A] Weeks of Treatment: [2:16] [N/A:N/A] Wound Status: [2:Open] [N/A:N/A] Measurements L x W x D 1.4x1.4x0.4 [N/A:N/A] (cm) Area (cm) : [2:1.539] [N/A:N/A] Volume (cm) : [2:0.616] [N/A:N/A] % Reduction in Area: [2:2.00%] [  N/A:N/A] % Reduction in Volume: 73.90% [N/A:N/A] Classification: [2:Full Thickness Without Exposed Support Structures] [N/A:N/A] Exudate Amount: [2:Medium] [N/A:N/A] Exudate Type: [2:Serous] [N/A:N/A] Exudate Color: [2:amber] [N/A:N/A] Wound Margin: [2:Thickened] [N/A:N/A] Granulation Amount: [2:Large (67-100%)] [N/A:N/A] Granulation Quality: [2:Pink, Pale] [N/A:N/A] Necrotic Amount: [2:Small (1-33%)] [N/A:N/A] Exposed Structures: [2:Fat Layer (Subcutaneous Tissue) Exposed: Yes Fascia: No Tendon: No Muscle: No Joint: No Bone: No Small (1-33%)] [N/A:N/A N/A] Treatment Notes Electronic Signature(s) Signed: 10/25/2018 6:11:18 PM By: Linton Ham MD Signed: 10/25/2018 6:17:12 PM By: Levan Hurst RN, BSN Entered By: Linton Ham on 10/25/2018 08:22:48 -------------------------------------------------------------------------------- [place] Details Patient Name: Date of Service: HARJOT, DIBELLO 10/25/2018 8:00 AM Medical Record Number:[id number] Patient Account Number: [id number] Date of Birth/Sex: Treating RN: October 23, 1950 (68 y.o. [name] Primary Care Cristian Davitt: Christain [place] Other Clinician: Referring Kelseigh Diver: Treating Meena Barrantes/Extender:Robson,  Glendale Chard, Letta Moynahan in Treatment: 16 Active Inactive Wound/Skin Impairment Nursing Diagnoses: Impaired tissue integrity Knowledge deficit related to smoking impact on wound healing Knowledge deficit related to ulceration/compromised skin integrity Goals: Patient/caregiver will verbalize understanding of skin care regimen Date Initiated: 07/05/2018 Target Resolution Date: 11/26/2018 Goal Status: Active Ulcer/skin breakdown will have a volume reduction of 30% by week 4 Date Initiated: 07/05/2018 Date Inactivated: 08/02/2018 Target Resolution Date: 08/06/2018 Goal Status: Met Ulcer/skin breakdown will have a volume reduction of 50% by week 8 Date Initiated: 08/02/2018 Date Inactivated: 08/30/2018 Target Resolution Date: 09/03/2018 Goal Status: Met Ulcer/skin breakdown will have a volume reduction of 80% by week 12 Date Initiated: 08/30/2018 Date Inactivated: 10/11/2018 Target Resolution Date: 10/01/2018 Unmet Reason: unable to Goal Status: Unmet offload adequately Interventions: Assess patient/caregiver ability to obtain necessary supplies Assess patient/caregiver ability to perform ulcer/skin care regimen upon admission and as needed Assess ulceration(s) every visit Provide education on smoking Provide education on ulcer and skin care Notes: Electronic Signature(s) Signed: 10/25/2018 6:17:12 PM By: Levan Hurst RN, BSN Entered By: Levan Hurst on 10/25/2018 07:55:48 -------------------------------------------------------------------------------- Pain Assessment Details Patient Name: Date of Service: DONTREL, SMETHERS 10/25/2018 8:00 AM Medical Record [id number] Patient Account Number: [id number] Date of Birth/Sex: Treating RN: 1950/08/26 (68 y.o. Marvis Repress Primary Care Kalel Harty: Christain [place] Other Clinician: Referring Jadesola Poynter: Treating Sumi Lye/Extender:Robson, Glendale Chard, Letta Moynahan in Treatment: 16 Active Problems Location of Pain Severity and  Description of Pain Patient Has Paino No Site Locations Pain Management and Medication Current Pain Management: Electronic Signature(s) Signed: 10/28/2018 4:29:59 PM By: Kela Millin Entered By: Kela Millin on 10/25/2018 07:59:27 -------------------------------------------------------------------------------- Patient/Caregiver Education Details Patient Name: Date of Service: [name] 10/5/2020andnbsp8:00 AM Medical Record Number:[id number] Patient Account Number: [id number] Date of Birth/Gender: May 22, 1950 (67 y.o. M) Treating RN: Levan Hurst Primary Care Physician: Christain [place] Other Clinician: Referring Physician: Treating Physician/Extender:Robson, Glendale Chard, Letta Moynahan in Treatment: 16 Education Assessment Education Provided To: Patient Education Topics Provided Smoking and Wound Healing: Methods: Explain/Verbal Responses: State content correctly Wound/Skin Impairment: Methods: Explain/Verbal Responses: State content correctly Electronic Signature(s) Signed: 10/25/2018 6:17:12 PM By: Levan Hurst RN, BSN Entered By: Levan Hurst on 10/25/2018 07:56:05 -------------------------------------------------------------------------------- Wound Assessment Details Patient Name: Date of Service: COBI, DELPH 10/25/2018 8:00 AM Medical Record [id number] Patient Account Number: [id number] Date of Birth/Sex: Treating RN: Jun 09, 1950 (67 y.o. Marvis Repress Primary Care Jasiel Belisle: Christain [place] Other Clinician: Referring Kairee Isa: Treating Maleko Greulich/Extender:Robson, Glendale Chard, Jama Flavors Weeks in Treatment: 16 Wound Status Wound Number: 2 Primary Neuropathic Ulcer-Non Diabetic Etiology: Wound Location: Left Calcaneus Wound Open Wounding Event: Gradually Appeared Status: Date Acquired: 01/20/2017 Date Acquired: 01/20/2017 Comorbid Chronic Obstructive  Pulmonary Disease Weeks Of Treatment: 16 History: (COPD), Sleep Apnea, Arrhythmia,  Coronary Clustered Wound: No Artery Disease, Hypertension, Myocardial Infarction, Osteoarthritis Photos Wound Measurements Length: (cm) 1.4 % Reduct Width: (cm) 1.4 % Reduct Depth: (cm) 0.4 Epitheli Area: (cm) 1.539 Tunneli Volume: (cm) 0.616 Undermi Wound Description Full Thickness Without Exposed Support Foul Odo Classification: Structures Slough/F Wound Thickened Margin: Exudate Medium Amount: Exudate Serous Type: Exudate amber Color: Wound Bed Granulation Amount: Large (67-100%) Granulation Quality: Pink, Pale Fascia E Necrotic Amount: Small (1-33%) Fat Laye Necrotic Quality: Adherent Slough Tendon E Muscle E Joint Ex Bone Exp r After Cleansing: No ibrino Yes Exposed Structure xposed: No r (Subcutaneous Tissue) Exposed: Yes xposed: No xposed: No posed: No osed: No ion in Area: 2% ion in Volume: 73.9% alization: Small (1-33%) ng: No ning: No Treatment Notes Wound #2 (Left Calcaneus) 1. Cleanse With Wound Cleanser 2. Periwound Care Moisturizing lotion 3. Primary Dressing Applied Hydrofera Blue 4. Secondary Dressing Dry Gauze Roll Gauze Heel Cup 5. Secured With Tape 7. Footwear/Offloading device applied Felt/Foam Notes netting. felt also used under heel cup as directed. globoped heel offloading sandal with felt placed in shoe. Electronic Signature(s) Signed: 10/26/2018 12:19:24 PM By: Mikeal Hawthorne EMT/HBOT Signed: 10/28/2018 4:29:59 PM By: Kela Millin Entered By: Mikeal Hawthorne on 10/26/2018 09:20:26 -------------------------------------------------------------------------------- Vitals Details Patient Name: Date of Service: DANIELA, HERNAN 10/25/2018 8:00 AM Medical Record Number:[id number] Patient Account Number: [id number] Date of Birth/Sex: Treating RN: [birth year] (68 y.o. Marvis Repress Primary Care Tristen Luce: Christain [place] Other Clinician: Referring Oralia Criger: Treating Lessie Manigo/Extender:Robson, Glendale Chard, Letta Moynahan in Treatment: 16 Vital Signs Time Taken: 07:54 Temperature (F): 98.4 Height (in): 76 Pulse (bpm): 80 Weight (lbs): 335 Respiratory Rate (breaths/min): 22 Body Mass Index (BMI): 40.8 Blood Pressure (mmHg): 122/67 Reference Range: 80 - 120 mg / dl Electronic Signature(s) Signed: 10/28/2018 4:29:59 PM By: Kela Millin Entered By: Kela Millin on 10/25/2018 07:57:05

## 2018-11-01 ENCOUNTER — Encounter (HOSPITAL_BASED_OUTPATIENT_CLINIC_OR_DEPARTMENT_OTHER): Payer: Medicare Other | Attending: Internal Medicine | Admitting: Internal Medicine

## 2018-11-01 ENCOUNTER — Other Ambulatory Visit: Payer: Self-pay

## 2018-11-01 ENCOUNTER — Telehealth: Payer: Self-pay | Admitting: Pharmacist

## 2018-11-01 DIAGNOSIS — S8012XA Contusion of left lower leg, initial encounter: Secondary | ICD-10-CM | POA: Insufficient documentation

## 2018-11-01 DIAGNOSIS — I2510 Atherosclerotic heart disease of native coronary artery without angina pectoris: Secondary | ICD-10-CM | POA: Diagnosis not present

## 2018-11-01 DIAGNOSIS — R7303 Prediabetes: Secondary | ICD-10-CM | POA: Diagnosis not present

## 2018-11-01 DIAGNOSIS — X58XXXA Exposure to other specified factors, initial encounter: Secondary | ICD-10-CM | POA: Diagnosis not present

## 2018-11-01 DIAGNOSIS — I252 Old myocardial infarction: Secondary | ICD-10-CM | POA: Insufficient documentation

## 2018-11-01 DIAGNOSIS — G9009 Other idiopathic peripheral autonomic neuropathy: Secondary | ICD-10-CM | POA: Diagnosis not present

## 2018-11-01 DIAGNOSIS — J449 Chronic obstructive pulmonary disease, unspecified: Secondary | ICD-10-CM | POA: Insufficient documentation

## 2018-11-01 DIAGNOSIS — I10 Essential (primary) hypertension: Secondary | ICD-10-CM | POA: Diagnosis not present

## 2018-11-01 DIAGNOSIS — G4730 Sleep apnea, unspecified: Secondary | ICD-10-CM | POA: Diagnosis not present

## 2018-11-01 DIAGNOSIS — L97422 Non-pressure chronic ulcer of left heel and midfoot with fat layer exposed: Secondary | ICD-10-CM | POA: Insufficient documentation

## 2018-11-01 NOTE — Telephone Encounter (Signed)
Called pt on 11/01/18 at 11:50 AM.  Pt states he talked to his wife about quitting smoking, especially in the car, and she is not ready to do so. Pt states it makes it challenging for him to quit smoking until his wife quits. Pt is not interested in quitting at this time. Pt confirms he has phone number to get in contact with pharmacy team at [facility] pulmonology clinic. Instructed pt to make an appt with pharmacy when he is ready to quit smoking. Pt verbalized understanding.

## 2018-11-02 NOTE — Progress Notes (Signed)
ADD, JEZEWSKI (GO:[id number]) Visit Report for 11/01/2018 Debridement Details Patient Name: Date of Service: John Griffin, FREDIANI 11/01/2018 8:00 AM Medical Record Number:[id number] Patient Account Number: [id number] Date of Birth/Sex: 1950/05/21 (68 y.o. M) Treating RN: Levan Hurst Primary Care Provider: Christain Sacramento Other Clinician: Referring Provider: Treating Provider/Extender:Robson, Glendale Chard, Jama Flavors Weeks in Treatment: 17 Debridement Performed for Wound #2 Left Calcaneus Assessment: Performed By: Physician Ricard Dillon., MD Debridement Type: Debridement Level of Consciousness (Pre- Awake and Alert procedure): Pre-procedure Yes - 08:24 Verification/Time Out Taken: Start Time: 08:24 Pain Control: Lidocaine 4% Topical Solution Total Area Debrided (L x W): 1.1 (cm) x 1.4 (cm) = 1.54 (cm) Tissue and other material Viable, Non-Viable, Callus, Subcutaneous debrided: Level: Skin/Subcutaneous Tissue Debridement Description: Excisional Instrument: Curette Bleeding: Moderate Hemostasis Achieved: Silver Nitrate End Time: 08:26 Procedural Pain: 0 Post Procedural Pain: 0 Response to Treatment: Procedure was tolerated well Level of Consciousness Awake and Alert (Post-procedure): Post Debridement Measurements of Total Wound Length: (cm) 1.1 Width: (cm) 1.4 Depth: (cm) 0.7 Volume: (cm) 0.847 Character of Wound/Ulcer Post Improved Debridement: Post Procedure Diagnosis Same as Pre-procedure Electronic Signature(s) Signed: 11/01/2018 5:50:55 PM By: Linton Ham MD Signed: 11/02/2018 5:52:09 PM By: Levan Hurst RN, BSN Entered By: Linton Ham on 11/01/2018 08:45:08 -------------------------------------------------------------------------------- HPI Details Patient Name: Date of Service: Griffin, John 11/01/2018 8:00 AM Medical Record Number:[id number] Patient Account Number: [id number] Date of Birth/Sex: Treating RN: 12/19/1950 (68 y.o.  John Griffin Primary Care Provider: Christain Sacramento Other Clinician: Referring Provider: Treating Provider/Extender:Robson, Glendale Chard, Letta Moynahan in Treatment: 17 History of Present Illness HPI Description: ADMISSION 11/19/2017 This is a 68 year old man who is not a diabetic. He tells me that 6 months ago he noticed a callused area on his left heel that open into a wound. This is on the plantar aspect of his heel just near the tip of his calcaneus. He says that this is open and closed repetitively over the last 6 months. He recently saw his primary doctor who sent him here. He is been using Neosporin and a Band-Aid. He tells me that he follows with cardiology and he had a hemoglobin A1c but he does not know what it is but he has been told he is not a diabetic. Measurement on the wound was 0.2 x 1.5 with some undermining distally in this oval-shaped horizontally orientated wound. He does not feel any pain He tells me that for an indefinite period of time he has not had much feeling in his feet. He has not had a medical work-up for this. Our intake nurses noted reduced microfilament sensation The patient has a history of COPD, hypercholesterolemia, coronary artery disease, gastroesophageal reflux disease, prostate cancer treated with surgery, obstructive sleep apnea. He tells [place] that he twisted his back last week and he has a back brace he is also using a cane ABI in our clinic was 1.17 on the left 11/26/2017; the patient arrives with his wound looking a lot better. He has been offloading this using a heel offloading boot. Dimensions are a lot better. 12/03/2017; the area on the tip of his heel extending slightly into the plantar surface has no open area. Using a 5 curette and then a #10 scalpel I removed some callus but there is no open wound. I think this is going to be an offloading issue in a patient who has nondiabetic neuropathy. For now we are going to recommend a heel cup type heel  protector and see if this will maintain  skin integrity in this area. READMISSION 07/05/2018 Patient returns to clinic with a reopening of the left heel about a month and a half ago. He was in this clinic in the fall with a wound and roughly the same area. He has some form of idiopathic neuropathy not listed as a diabetic. Very ataxic wound. He also has leg length discrepancy secondary to a previous accident he had and according to his wife a lot of the pressure when he takes a step lands on the left heel. They have been using Neosporin more recently some leftover silver collagen. They did go into a heel offloading boot that we gave him last time. Past medical history as noted he is not a diabetic, he has idiopathic peripheral neuropathy, hypertension, coronary artery disease, obstructive sleep apnea on CPAP, prostate CA COPD. He is a heavy smoker ABI in our clinic was 1.05 on the left 6/22; x-ray I did last time showed no evidence of osteomyelitis. A wound culture showed Alcaligenes faecalis. I am not well familiar with this organism although I will try to look it up in more detail. In any case the wound is somewhat worse today with a small probing area off the base of the wound. Also problematic is that he does not seem to wear the heel offloading boot properly. We have emphasized that his foot has to be all the way forward, otherwise the heel will be on the floor. 6/29; the patient is completing his antibiotics. Depth of this is come in somewhat. He has been to see his primary doctor and diagnosed with prediabetes now on metformin 500 twice daily. 1 would wonder about the etiology of his neuropathy. They are making a better effort to use the heel offloading boot properly. He has significant gait ataxia I would be concerned about a total contact cast 7/6- Completed cipro, he continues to use the offloading shoe, he does have balance issues with right leg being shorter, has been changing dressing  on heel every other day 7/13; he is using his offloading shoe. Some improvement in length and width and depth. However this is a deep punched out wound on the left plantar heel. His right leg is shorter and his balance is ataxic secondary to a neuropathy. He is prediabetic on metformin. It would be difficult to see him in a total contact cast although he assures me along with his wife that they were doing as much pressure relief short of this they can 7/20; he is still in his heel offloading shoe. Absolutely no difference this week. Thick callus skin and subcutaneous tissue around the deep punched out wound. Required an extensive debridement. We have been using Prisma 7/27; he is still on his heel offloading shoe and according to the patient and his wife he is religiously offloading this. He has a little less depth to the wound certainly no change in the surface area. He continues to have thick callus skin and subcutaneous tissue around the wound edge which I continuously have to debride including today. If we can get this to a more superficial area that would be success. He is using silver collagen. He is not a candidate for a total contact cast secondary to gait and balance issues 08/23/18-Patient returns at 2 weeks for his left heel wound. This appears to be slightly bigger in dimension. Less necrotic tissue than before. Continue to use Prisma. 8/10-Patient returns at 1 week to continue care for his left heel wound, this is looking better than  before, definitely less necrotic tissue, continue to use Prisma 8/17; left plantar heel. Once again rolled up areas of skin and subcutaneous tissue around the wound. Wound surface is not too bad. We have been using Prisma 8/24; left plantar heel. Absolutely no change to rolled up skin and subcutaneous tissue around the wound removed with a #5 curette we have been using silver collagen for a long period of time without any improvement although I think this  is largely a pressure issue. I have changed him to [facility] today. Again we have talked about a total contact cast although this would add to his already left greater than right leg length discrepancy about an inch and a half according to the patient. 8/31; left plantar heel. Absolutely no change. Circular punched out wound with callus thick subcutaneous tissue around the wound margin. We have been using Hydrofera Blue 9/21; left plantar heel although the surface area of this is not changed the depth seems somewhat better. There is also not as much callus and thick subcutaneous tissue. We have been using Hydrofera Blue. He came in this day with a support on the right foot which is his shorter leg. We are using a heel offloading boot on the left. 9/28 left plantar heel. Using Hydrofera Blue. We have improvement in measurements but this is still a relatively punched-out area with evidence of excessive pressure 10/5; left plantar heel using Hydrofera Blue surface area measurements are not different however there is certainly less callus less rolled edges around the wound. No debridement today. I am continuing Hydrofera Blue for another week. There is no further option for offloading other than the heel offloading boot that he currently uses 10/12; no different from last week using Hydrofera Blue. Still requiring debridement. 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 John Griffin Primary Care Provider: Christain Sacramento Other Clinician: Referring Provider: Treating  Provider/Extender:Robson, Glendale Chard, Letta Moynahan in Treatment: 17 Constitutional Patient is hypertensive.. Pulse regular and within target range for patient.Marland Kitchen Respirations regular, non-labored and within target range.. Temperature is normal and within the target range for the patient.Marland Kitchen Appears in no distress. Cardiovascular Both dorsalis pedis and posterior tibial pulses are palpable. Notes Wound exam; no erythema around the wound. He still has callus and thick skin and subcutaneous tissue around the wound. This is not filling in. Using a #3 curette circumferential debridement of the edges. Surface also debrided of fibrinous exudate. This cleans up quite nicely. Hemostasis with silver nitrate Electronic Signature(s) Signed: 11/01/2018 5:50:55 PM By: Linton Ham MD Entered By: Linton Ham on 11/01/2018 08:48:26 -------------------------------------------------------------------------------- Physician Orders Details Patient Name: Date of Service: GEORGIO, GO 11/01/2018 8:00 AM Medical Record Number:[id number] Patient Account Number: [id number] Date of Birth/Sex: Treating RN: December 15, 1950 (68 y.o. John Griffin Primary Care Provider: Christain Sacramento Other Clinician: Referring Provider: Treating Provider/Extender:Robson, Glendale Chard, Letta Moynahan in Treatment: 17 Verbal / Phone Orders: No Diagnosis Coding ICD-10 Coding Code Description [phone] Non-pressure chronic ulcer of left heel and midfoot with fat layer exposed G90.09 Other idiopathic peripheral autonomic neuropathy R73.03 Prediabetes Follow-up Appointments Return Appointment in 1 week. Dressing Change Frequency Wound #2 Left Calcaneus Change Dressing every other day. Skin Barriers/Peri-Wound Care Wound #2 Left  Calcaneus Moisturizing lotion Wound Cleansing Wound #2 Left Calcaneus May shower and wash wound with soap and water. Primary Wound Dressing Wound #2 Left Calcaneus Endoform - moisten with  normal saline Secondary Dressing Wound #2 Left Calcaneus Kerlix/Rolled Gauze Dry Gauze Heel Cup Other: - felt callous pad (also apply extra felt in sandal) Off-Loading Other: - Gloped heel offloader sandal to left foot - extra felt for padding Electronic Signature(s) Signed: 11/01/2018 5:18:03 PM By: Kela Millin Signed: 11/01/2018 5:50:55 PM By: Linton Ham MD Entered By: Kela Millin on 11/01/2018 08:29:01 -------------------------------------------------------------------------------- Problem List Details Patient Name: Date of Service: JULIE, HENNION 11/01/2018 8:00 AM Medical Record Number:[id number] Patient Account Number: [id number] Date of Birth/Sex: Treating RN: 05-Sep-1950 (68 y.o. John Griffin Primary Care Provider: Christain Sacramento Other Clinician: Referring Provider: Treating Provider/Extender:Robson, Glendale Chard, Jama Flavors Weeks in Treatment: 17 Active Problems ICD-10 Evaluated Encounter Code Description Active Date Today Diagnosis L97.422 Non-pressure chronic ulcer of left heel and midfoot 07/05/2018 No Yes with fat layer exposed G90.09 Other idiopathic peripheral autonomic neuropathy 07/05/2018 No Yes R73.03 Prediabetes 07/19/2018 No Yes Inactive Problems Resolved Problems Electronic Signature(s) Signed: 11/01/2018 5:50:55 PM By: Linton Ham MD Entered By: Linton Ham on 11/01/2018 08:44:50 -------------------------------------------------------------------------------- Progress Note Details Patient Name: Date of Service: GOKU, KELEMAN 11/01/2018 8:00 AM Medical Record Number:[id number] Patient Account Number: [id number] Date of Birth/Sex: Treating RN: 1950/09/01 (68 y.o. John Griffin Primary Care Provider: Christain Sacramento Other Clinician: Referring Provider: Treating Provider/Extender:Robson, Glendale Chard, Letta Moynahan in Treatment: 17 Subjective History of Present Illness (HPI) ADMISSION 11/19/2017 This is a  68 year old man who is not a diabetic. He tells me that 6 months ago he noticed a callused area on his left heel that open into a wound. This is on the plantar aspect of his heel just near the tip of his calcaneus. He says that this is open and closed repetitively over the last 6 months. He recently saw his primary doctor who sent him here. He is been using Neosporin and a Band-Aid. He tells me that he follows with cardiology and he had a hemoglobin A1c but he does not know what it is but he has been told he is not a diabetic. Measurement on the wound was 0.2 x 1.5 with some undermining distally in this oval-shaped horizontally orientated wound. He does not feel any pain He tells me that for an indefinite period of time he has not had much feeling in his feet. He has not had a medical work-up for this. Our intake nurses noted reduced microfilament sensation The patient has a history of COPD, hypercholesterolemia, coronary artery disease, gastroesophageal reflux disease, prostate cancer treated with surgery, obstructive sleep apnea. He tells [place] that he twisted his back last week and he has a back brace he is also using a cane ABI in our clinic was 1.17 on the left 11/26/2017; the patient arrives with his wound looking a lot better. He has been offloading this using a heel offloading boot. Dimensions are a lot better. 12/03/2017; the area on the tip of his heel extending slightly into the plantar surface has no open area. Using a 5 curette and then a #10 scalpel I removed some callus but there is no open wound. I think this is going to be an offloading issue in a patient who has nondiabetic neuropathy. For now we are going to recommend a heel cup type heel protector and see if this will maintain skin integrity in this area. READMISSION 07/05/2018 Patient  returns to clinic with a reopening of the left heel about a month and a half ago. He was in this clinic in the fall with a wound and roughly the  same area. He has some form of idiopathic neuropathy not listed as a diabetic. Very ataxic wound. He also has leg length discrepancy secondary to a previous accident he had and according to his wife a lot of the pressure when he takes a step lands on the left heel. They have been using Neosporin more recently some leftover silver collagen. They did go into a heel offloading boot that we gave him last time. Past medical history as noted he is not a diabetic, he has idiopathic peripheral neuropathy, hypertension, coronary artery disease, obstructive sleep apnea on CPAP, prostate CA COPD. He is a heavy smoker ABI in our clinic was 1.05 on the left 6/22; x-ray I did last time showed no evidence of osteomyelitis. A wound culture showed Alcaligenes faecalis. I am not well familiar with this organism although I will try to look it up in more detail. In any case the wound is somewhat worse today with a small probing area off the base of the wound. Also problematic is that he does not seem to wear the heel offloading boot properly. We have emphasized that his foot has to be all the way forward, otherwise the heel will be on the floor. 6/29; the patient is completing his antibiotics. Depth of this is come in somewhat. He has been to see his primary doctor and diagnosed with prediabetes now on metformin 500 twice daily. 1 would wonder about the etiology of his neuropathy. They are making a better effort to use the heel offloading boot properly. He has significant gait ataxia I would be concerned about a total contact cast 7/6- Completed cipro, he continues to use the offloading shoe, he does have balance issues with right leg being shorter, has been changing dressing on heel every other day 7/13; he is using his offloading shoe. Some improvement in length and width and depth. However this is a deep punched out wound on the left plantar heel. His right leg is shorter and his balance is ataxic secondary to  a neuropathy. He is prediabetic on metformin. It would be difficult to see him in a total contact cast although he assures me along with his wife that they were doing as much pressure relief short of this they can 7/20; he is still in his heel offloading shoe. Absolutely no difference this week. Thick callus skin and subcutaneous tissue around the deep punched out wound. Required an extensive debridement. We have been using Prisma 7/27; he is still on his heel offloading shoe and according to the patient and his wife he is religiously offloading this. He has a little less depth to the wound certainly no change in the surface area. He continues to have thick callus skin and subcutaneous tissue around the wound edge which I continuously have to debride including today. If we can get this to a more superficial area that would be success. He is using silver collagen. He is not a candidate for a total contact cast secondary to gait and balance issues 08/23/18-Patient returns at 2 weeks for his left heel wound. This appears to be slightly bigger in dimension. Less necrotic tissue than before. Continue to use Prisma. 8/10-Patient returns at 1 week to continue care for his left heel wound, this is looking better than before, definitely less necrotic tissue, continue to  use Prisma 8/17; left plantar heel. Once again rolled up areas of skin and subcutaneous tissue around the wound. Wound surface is not too bad. We have been using Prisma 8/24; left plantar heel. Absolutely no change to rolled up skin and subcutaneous tissue around the wound removed with a #5 curette we have been using silver collagen for a long period of time without any improvement although I think this is largely a pressure issue. I have changed him to [facility] today. Again we have talked about a total contact cast although this would add to his already left greater than right leg length discrepancy about an inch and a half according  to the patient. 8/31; left plantar heel. Absolutely no change. Circular punched out wound with callus thick subcutaneous tissue around the wound margin. We have been using Hydrofera Blue 9/21; left plantar heel although the surface area of this is not changed the depth seems somewhat better. There is also not as much callus and thick subcutaneous tissue. We have been using Hydrofera Blue. He came in this day with a support on the right foot which is his shorter leg. We are using a heel offloading boot on the left. 9/28 left plantar heel. Using Hydrofera Blue. We have improvement in measurements but this is still a relatively punched-out area with evidence of excessive pressure 10/5; left plantar heel using Hydrofera Blue surface area measurements are not different however there is certainly less callus less rolled edges around the wound. No debridement today. I am continuing Hydrofera Blue for another week. There is no further option for offloading other than the heel offloading boot that he currently uses 10/12; no different from last week using Hydrofera Blue. Still requiring debridement. I believe this is an offloading issue Objective Constitutional Patient is hypertensive.. Pulse regular and within target range for patient.Marland Kitchen Respirations regular, non-labored and within target range.. Temperature is normal and within the target range for the patient.Marland Kitchen Appears in no distress. Vitals Time Taken: 7:57 AM, Height: 76 in, Weight: 335 lbs, BMI: 40.8, Temperature: 98 F, Pulse: 87 bpm, Respiratory Rate: 18 breaths/min, Blood Pressure: 158/80 mmHg. Cardiovascular Both dorsalis pedis and posterior tibial pulses are palpable. General Notes: Wound exam; no erythema around the wound. He still has callus and thick skin and subcutaneous tissue around the wound. This is not filling in. Using a #3 curette circumferential debridement of the edges. Surface also debrided of fibrinous exudate. This cleans up  quite nicely. Hemostasis with silver nitrate Integumentary (Hair, Skin) Wound #2 status is Open. Original cause of wound was Gradually Appeared. The wound is located on the Left Calcaneus. The wound measures 1.1cm length x 1.4cm width x 0.7cm depth; 1.21cm^2 area and 0.847cm^3 volume. There is Fat Layer (Subcutaneous Tissue) Exposed exposed. There is no tunneling or undermining noted. There is a medium amount of serous drainage noted. The wound margin is thickened. There is large (67-100%) pink, pale granulation within the wound bed. There is a small (1-33%) amount of necrotic tissue within the wound bed including Adherent Slough. Assessment Active Problems ICD-10 Non-pressure chronic ulcer of left heel and midfoot with fat layer exposed Other idiopathic peripheral autonomic neuropathy Prediabetes Procedures Wound #2 Pre-procedure diagnosis of Wound #2 is a Neuropathic Ulcer-Non Diabetic located on the Left Calcaneus . There was a Excisional Skin/Subcutaneous Tissue Debridement with a total area of 1.54 sq cm performed by Ricard Dillon., MD. With the following instrument(s): Curette to remove Viable and Non-Viable tissue/material. Material removed includes Callus and  Subcutaneous Tissue and after achieving pain control using Lidocaine 4% Topical Solution. No specimens were taken. A time out was conducted at 08:24, prior to the start of the procedure. A Moderate amount of bleeding was controlled with Silver Nitrate. The procedure was tolerated well with a pain level of 0 throughout and a pain level of 0 following the procedure. Post Debridement Measurements: 1.1cm length x 1.4cm width x 0.7cm depth; 0.847cm^3 volume. Character of Wound/Ulcer Post Debridement is improved. Post procedure Diagnosis Wound #2: Same as Pre-Procedure Plan Follow-up Appointments: Return Appointment in 1 week. Dressing Change Frequency: Wound #2 Left Calcaneus: Change Dressing every other day. Skin  Barriers/Peri-Wound Care: Wound #2 Left Calcaneus: Moisturizing lotion Wound Cleansing: Wound #2 Left Calcaneus: May shower and wash wound with soap and water. Primary Wound Dressing: Wound #2 Left Calcaneus: Endoform - moisten with normal saline Secondary Dressing: Wound #2 Left Calcaneus: Kerlix/Rolled Gauze Dry Gauze Heel Cup Other: - felt callous pad (also apply extra felt in sandal) Off-Loading: Other: - Gloped heel offloader sandal to left foot - extra felt for padding 1. Change the primary dressing to endoform. This is still not filling it 2. I talked to him again about offloading. Gait instability is the problem with a total contact cast. I asked him to speak to his wife about this to see if anybody could come in with him Electronic Signature(s) Signed: 11/01/2018 5:50:55 PM By: Linton Ham MD Entered By: Linton Ham on 11/01/2018 08:51:21 -------------------------------------------------------------------------------- SuperBill Details Patient Name: Date of Service: RAMONTE, FECTEAU 11/01/2018 Medical Record Number:[id number] Patient Account Number: [id number] Date of Birth/Sex: Treating RN: May 31, 1950 (68 y.o. 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 idiopathic peripheral autonomic neuropathy R73.03 Prediabetes Facility Procedures CPT4 Code Description: [id number] - DEB SUBQ TISSUE 20 SQ CM/< ICD-10 Diagnosis Description L97.422 Non-pressure chronic ulcer of left heel and midfoot with fat Modifier: layer exposed Quantity: 1 Physician Procedures CPT4 Code Description: [id number] - WC PHYS SUBQ TISS 20 SQ CM ICD-10 Diagnosis Description L97.422 Non-pressure chronic ulcer of left  heel and midfoot with fat l Modifier: ayer exposed Quantity: 1 Electronic Signature(s) Signed: 11/01/2018 5:50:55 PM By: Linton Ham MD Entered By: Linton Ham on 11/01/2018 08:51:32

## 2018-11-07 ENCOUNTER — Other Ambulatory Visit: Payer: Self-pay | Admitting: Cardiovascular Disease

## 2018-11-08 ENCOUNTER — Other Ambulatory Visit: Payer: Self-pay

## 2018-11-08 ENCOUNTER — Encounter (HOSPITAL_BASED_OUTPATIENT_CLINIC_OR_DEPARTMENT_OTHER): Payer: Medicare Other | Admitting: Internal Medicine

## 2018-11-08 DIAGNOSIS — S8012XA Contusion of left lower leg, initial encounter: Secondary | ICD-10-CM | POA: Diagnosis not present

## 2018-11-08 DIAGNOSIS — G9009 Other idiopathic peripheral autonomic neuropathy: Secondary | ICD-10-CM | POA: Diagnosis not present

## 2018-11-08 DIAGNOSIS — L97422 Non-pressure chronic ulcer of left heel and midfoot with fat layer exposed: Secondary | ICD-10-CM | POA: Diagnosis not present

## 2018-11-08 DIAGNOSIS — G4730 Sleep apnea, unspecified: Secondary | ICD-10-CM | POA: Diagnosis not present

## 2018-11-08 DIAGNOSIS — R7303 Prediabetes: Secondary | ICD-10-CM | POA: Diagnosis not present

## 2018-11-08 DIAGNOSIS — J449 Chronic obstructive pulmonary disease, unspecified: Secondary | ICD-10-CM | POA: Diagnosis not present

## 2018-11-10 NOTE — Progress Notes (Signed)
CORNELIO, John Griffin ([id number]) Visit Report for 11/08/2018 Debridement Details Patient Name: Date of Service: John Griffin, John Griffin 11/08/2018 8:00 AM Medical Record Number:[id number] Patient Account Number: [id number] Date of Birth/Sex: 06/25/1950 (67 y.o. M) Treating RN: John Griffin Primary Care Provider: Christain Griffin Other Clinician: Referring Provider: Treating Provider/Extender:John Griffin, John Griffin, John Griffin in Treatment: 18 Debridement Performed for Wound #2 Left Calcaneus Assessment: Performed By: Physician John Griffin., MD Debridement Type: Debridement Level of Consciousness (Pre- Awake and Alert procedure): Pre-procedure Yes - 08:31 Verification/Time Out Taken: Start Time: 08:31 Total Area Debrided (L x W): 1 (cm) x 1 (cm) = 1 (cm) Tissue and other material Viable, Non-Viable, Callus, Subcutaneous debrided: Level: Skin/Subcutaneous Tissue Debridement Description: Excisional Instrument: Curette Bleeding: Minimum Hemostasis Achieved: Pressure End Time: 08:32 Procedural Pain: 0 Post Procedural Pain: 0 Response to Treatment: Procedure was tolerated well Level of Consciousness Awake and Alert (Post-procedure): Post Debridement Measurements of Total Wound Length: (cm) 1 Width: (cm) 1 Depth: (cm) 0.6 Volume: (cm) 0.471 Character of Wound/Ulcer Post Improved Debridement: Post Procedure Diagnosis Same as Pre-procedure Electronic Signature(s) Signed: 11/08/2018 5:54:03 PM By: John Ham MD Signed: 11/10/2018 6:50:57 PM By: John Hurst RN, BSN Entered By: John Griffin on 11/08/2018 08:59:59 -------------------------------------------------------------------------------- HPI Details Patient Name: Date of Service: John Griffin, John Griffin 11/08/2018 8:00 AM Medical Record Number:[id number] Patient Account Number: [id number] Date of Birth/Sex: Treating RN: John Griffin 01, 1952 (68 y.o. John Griffin Primary Care Provider: Christain Griffin Other  Clinician: Referring Provider: Treating Provider/Extender:John Griffin, John Griffin, John Griffin in Treatment: 18 History of Present Illness HPI Description: ADMISSION 11/19/2017 This is a 67 year old man who is not a diabetic. He tells me that 6 months ago he noticed a callused area on his left heel that open into a wound. This is on the plantar aspect of his heel just near the tip of his calcaneus. He says that this is open and closed repetitively over the last 6 months. He recently saw his primary doctor who sent him here. He is been using Neosporin and a Band-Aid. He tells me that he follows with cardiology and he had a hemoglobin A1c but he does not know what it is but he has been told he is not a diabetic. Measurement on the wound was 0.2 x 1.5 with some undermining distally in this oval-shaped horizontally orientated wound. He does not feel any pain He tells me that for an indefinite period of time he has not had much feeling in his feet. He has not had a medical work-up for this. Our intake nurses noted reduced microfilament sensation The patient has a history of COPD, hypercholesterolemia, coronary artery disease, gastroesophageal reflux disease, prostate cancer treated with surgery, obstructive sleep apnea. He tells [place] that he twisted his back last week and he has a back brace he is also using a cane ABI in our clinic was 1.17 on the left 11/26/2017; the patient arrives with his wound looking a lot better. He has been offloading this using a heel offloading boot. Dimensions are a lot better. 12/03/2017; the area on the tip of his heel extending slightly into the plantar surface has no open area. Using a 5 curette and then a #10 scalpel I removed some callus but there is no open wound. I think this is going to be an offloading issue in a patient who has nondiabetic neuropathy. For now we are going to recommend a heel cup type heel protector and see if this will maintain skin integrity  in this area. READMISSION 07/05/2018  Patient returns to clinic with a reopening of the left heel about a month and a half ago. He was in this clinic in the fall with a wound and roughly the same area. He has some form of idiopathic neuropathy not listed as a diabetic. Very ataxic wound. He also has leg length discrepancy secondary to a previous accident he had and according to his wife a lot of the pressure when he takes a step lands on the left heel. They have been using Neosporin more recently some leftover silver collagen. They did go into a heel offloading boot that we gave him last time. Past medical history as noted he is not a diabetic, he has idiopathic peripheral neuropathy, hypertension, coronary artery disease, obstructive sleep apnea on CPAP, prostate CA COPD. He is a heavy smoker ABI in our clinic was 1.05 on the left 6/22; x-ray I did last time showed no evidence of osteomyelitis. A wound culture showed Alcaligenes faecalis. I am not well familiar with this organism although I will try to look it up in more detail. In any case the wound is somewhat worse today with a small probing area off the base of the wound. Also problematic is that he does not seem to wear the heel offloading boot properly. We have emphasized that his foot has to be all the way forward, otherwise the heel will be on the floor. 6/29; the patient is completing his antibiotics. Depth of this is come in somewhat. He has been to see his primary doctor and diagnosed with prediabetes now on metformin 500 twice daily. 1 would wonder about the etiology of his neuropathy. They are making a better effort to use the heel offloading boot properly. He has significant gait ataxia I would be concerned about a total contact cast 7/6- Completed cipro, he continues to use the offloading shoe, he does have balance issues with right leg being shorter, has been changing dressing on heel every other day 7/13; he is using his  offloading shoe. Some improvement in length and width and depth. However this is a deep punched out wound on the left plantar heel. His right leg is shorter and his balance is ataxic secondary to a neuropathy. He is prediabetic on metformin. It would be difficult to see him in a total contact cast although he assures me along with his wife that they were doing as much pressure relief short of this they can 7/20; he is still in his heel offloading shoe. Absolutely no difference this week. Thick callus skin and subcutaneous tissue around the deep punched out wound. Required an extensive debridement. We have been using Prisma 7/27; he is still on his heel offloading shoe and according to the patient and his wife he is religiously offloading this. He has a little less depth to the wound certainly no change in the surface area. He continues to have thick callus skin and subcutaneous tissue around the wound edge which I continuously have to debride including today. If we can get this to a more superficial area that would be success. He is using silver collagen. He is not a candidate for a total contact cast secondary to gait and balance issues 08/23/18-Patient returns at 2 weeks for his left heel wound. This appears to be slightly bigger in dimension. Less necrotic tissue than before. Continue to use Prisma. 8/10-Patient returns at 1 week to continue care for his left heel wound, this is looking better than before, definitely less necrotic tissue, continue to  use Prisma 8/17; left plantar heel. Once again rolled up areas of skin and subcutaneous tissue around the wound. Wound surface is not too bad. We have been using Prisma 8/24; left plantar heel. Absolutely no change to rolled up skin and subcutaneous tissue around the wound removed with a #5 curette we have been using silver collagen for a long period of time without any improvement although I think this is largely a pressure issue. I have changed him  to [facility] today. Again we have talked about a total contact cast although this would add to his already left greater than right leg length discrepancy about an inch and a half according to the patient. 8/31; left plantar heel. Absolutely no change. Circular punched out wound with callus thick subcutaneous tissue around the wound margin. We have been using Hydrofera Blue 9/21; left plantar heel although the surface area of this is not changed the depth seems somewhat better. There is also not as much callus and thick subcutaneous tissue. We have been using Hydrofera Blue. He came in this day with a support on the right foot which is his shorter leg. We are using a heel offloading boot on the left. 9/28 left plantar heel. Using Hydrofera Blue. We have improvement in measurements but this is still a relatively punched-out area with evidence of excessive pressure 10/5; left plantar heel using Hydrofera Blue surface area measurements are not different however there is certainly less callus less rolled edges around the wound. No debridement today. I am continuing Hydrofera Blue for another week. There is no further option for offloading other than the heel offloading boot that he currently uses 10/12; no different from last week using Hydrofera Blue. Still requiring debridement. I believe this is an offloading issue 10/19; we switch to endoform last week. Surface area is improved and the wound certainly looks smaller from that surface area point of view but the depth is about the same. Extensive discussion today about a total contact cast. His wife is present. She expressed both of their desire to get this wound healed and they want to try the cast. We went over risks including falls, fractures, problems with people with restless legs etc. etc. the bottom line is that we agreed to try this. He will be back on Thursday for the obligatory total contact cast change Electronic Signature(s) Signed:  11/08/2018 5:54:03 PM By: John Ham MD Entered By: John Griffin on 11/08/2018 09:01:38 -------------------------------------------------------------------------------- Physical Exam Details Patient Name: Date of Service: John Griffin, John Griffin 11/08/2018 8:00 AM Medical Record Number:[id number] Patient Account Number: [id number] Date of Birth/Sex: Treating RN: Apr 17, 1950 (68 y.o. John Griffin Primary Care Provider: Other Clinician: Christain Griffin Referring Provider: Treating Provider/Extender:John Griffin, John Griffin, John Griffin in Treatment: 18 Constitutional Sitting or standing Blood Pressure is within target range for patient.. Pulse regular and within target range for patient.Marland Kitchen Respirations regular, non-labored and within target range.. Temperature is normal and within the target range for the patient.Marland Kitchen Appears in no distress. Cardiovascular Pedal pulses palpable. Notes Wound exam; no erythema around the wound. He still has callus and thick skin around the circumference although the depth is about the same. Using a #3 curette callus thick subcutaneous tissue removed from the wound margins. After extensive discussion, we agreed to put a total contact cast which was placed in the standard fashion. We will see him back in 3 days Electronic Signature(s) Signed: 11/08/2018 5:54:03 PM By: John Ham MD Entered By: John Griffin on 11/08/2018 09:03:25 --------------------------------------------------------------------------------  Physician Orders Details Patient Name: Date of Service: John Griffin, John Griffin 11/08/2018 8:00 AM Medical Record Number:[id number] Patient Account Number: [id number] Date of Birth/Sex: Treating RN: Jul 22, 1950 (68 y.o. John Griffin Primary Care Provider: Christain Griffin Other Clinician: Referring Provider: Treating Provider/Extender:John Griffin, John Griffin, John Griffin in Treatment: 18 Verbal / Phone Orders: No Diagnosis Coding ICD-10  Coding Code Description [phone] Non-pressure chronic ulcer of left heel and midfoot with fat layer exposed G90.09 Other idiopathic peripheral autonomic neuropathy R73.03 Prediabetes Follow-up Appointments Return Appointment in: - Thursday for cast change Dressing Change Frequency Wound #2 Left Calcaneus Do not change entire dressing for one week. Skin Barriers/Peri-Wound Care Wound #2 Left Calcaneus Moisturizing lotion Wound Cleansing Wound #2 Left Calcaneus May shower with protection. Primary Wound Dressing Wound #2 Left Calcaneus Endoform - moisten with normal saline Secondary Dressing Wound #2 Left Calcaneus Foam Kerlix/Rolled Gauze Dry Gauze Drawtex Off-Loading Total Contact Cast to Left Lower Extremity Other: - Gloped heel offloader sandal to left foot - extra felt for padding Electronic Signature(s) Signed: 11/08/2018 5:54:03 PM By: John Ham MD Signed: 11/10/2018 6:50:57 PM By: John Hurst RN, BSN Entered By: John Griffin on 11/08/2018 08:35:00 -------------------------------------------------------------------------------- Problem List Details Patient Name: Date of Service: John Griffin, John Griffin 11/08/2018 8:00 AM Medical Record Number:[id number] Patient Account Number: [id number] Date of Birth/Sex: Treating RN: 11/20/1950 (68 y.o. John Griffin Primary Care Provider: Christain Griffin Other Clinician: Referring Provider: Treating Provider/Extender:John Griffin, John Griffin, Jama Flavors Weeks in Treatment: 18 Active Problems ICD-10 Evaluated Encounter Code Description Active Date Today Diagnosis L97.422 Non-pressure chronic ulcer of left heel and midfoot 07/05/2018 No Yes with fat layer exposed G90.09 Other idiopathic peripheral autonomic neuropathy 07/05/2018 No Yes R73.03 Prediabetes 07/19/2018 No Yes Inactive Problems Resolved Problems Electronic Signature(s) Signed: 11/08/2018 5:54:03 PM By: John Ham MD Entered By: John Griffin on 11/08/2018  08:57:32 -------------------------------------------------------------------------------- Progress Note Details Patient Name: Date of Service: John Griffin, John Griffin 11/08/2018 8:00 AM Medical Record Number:[id number] Patient Account Number: [id number] Date of Birth/Sex: Treating RN: 1950/03/10 (68 y.o. John Griffin Primary Care Provider: Christain Griffin Other Clinician: Referring Provider: Treating Provider/Extender:John Griffin, John Griffin, John Griffin in Treatment: 18 Subjective History of Present Illness (HPI) ADMISSION 11/19/2017 This is a 68 year old man who is not a diabetic. He tells me that 6 months ago he noticed a callused area on his left heel that open into a wound. This is on the plantar aspect of his heel just near the tip of his calcaneus. He says that this is open and closed repetitively over the last 6 months. He recently saw his primary doctor who sent him here. He is been using Neosporin and a Band-Aid. He tells me that he follows with cardiology and he had a hemoglobin A1c but he does not know what it is but he has been told he is not a diabetic. Measurement on the wound was 0.2 x 1.5 with some undermining distally in this oval-shaped horizontally orientated wound. He does not feel any pain He tells me that for an indefinite period of time he has not had much feeling in his feet. He has not had a medical work-up for this. Our intake nurses noted reduced microfilament sensation The patient has a history of COPD, hypercholesterolemia, coronary artery disease, gastroesophageal reflux disease, prostate cancer treated with surgery, obstructive sleep apnea. He tells [place] that he twisted his back last week and he has a back brace he is also using a cane ABI in our clinic was 1.17 on the left 11/26/2017;  the patient arrives with his wound looking a lot better. He has been offloading this using a heel offloading boot. Dimensions are a lot better. 12/03/2017; the area on the tip of  his heel extending slightly into the plantar surface has no open area. Using a 5 curette and then a #10 scalpel I removed some callus but there is no open wound. I think this is going to be an offloading issue in a patient who has nondiabetic neuropathy. For now we are going to recommend a heel cup type heel protector and see if this will maintain skin integrity in this area. READMISSION 07/05/2018 Patient returns to clinic with a reopening of the left heel about a month and a half ago. He was in this clinic in the fall with a wound and roughly the same area. He has some form of idiopathic neuropathy not listed as a diabetic. Very ataxic wound. He also has leg length discrepancy secondary to a previous accident he had and according to his wife a lot of the pressure when he takes a step lands on the left heel. They have been using Neosporin more recently some leftover silver collagen. They did go into a heel offloading boot that we gave him last time. Past medical history as noted he is not a diabetic, he has idiopathic peripheral neuropathy, hypertension, coronary artery disease, obstructive sleep apnea on CPAP, prostate CA COPD. He is a heavy smoker ABI in our clinic was 1.05 on the left 6/22; x-ray I did last time showed no evidence of osteomyelitis. A wound culture showed Alcaligenes faecalis. I am not well familiar with this organism although I will try to look it up in more detail. In any case the wound is somewhat worse today with a small probing area off the base of the wound. Also problematic is that he does not seem to wear the heel offloading boot properly. We have emphasized that his foot has to be all the way forward, otherwise the heel will be on the floor. 6/29; the patient is completing his antibiotics. Depth of this is come in somewhat. He has been to see his primary doctor and diagnosed with prediabetes now on metformin 500 twice daily. 1 would wonder about the etiology of  his neuropathy. They are making a better effort to use the heel offloading boot properly. He has significant gait ataxia I would be concerned about a total contact cast 7/6- Completed cipro, he continues to use the offloading shoe, he does have balance issues with right leg being shorter, has been changing dressing on heel every other day 7/13; he is using his offloading shoe. Some improvement in length and width and depth. However this is a deep punched out wound on the left plantar heel. His right leg is shorter and his balance is ataxic secondary to a neuropathy. He is prediabetic on metformin. It would be difficult to see him in a total contact cast although he assures me along with his wife that they were doing as much pressure relief short of this they can 7/20; he is still in his heel offloading shoe. Absolutely no difference this week. Thick callus skin and subcutaneous tissue around the deep punched out wound. Required an extensive debridement. We have been using Prisma 7/27; he is still on his heel offloading shoe and according to the patient and his wife he is religiously offloading this. He has a little less depth to the wound certainly no change in the surface area. He continues  to have thick callus skin and subcutaneous tissue around the wound edge which I continuously have to debride including today. If we can get this to a more superficial area that would be success. He is using silver collagen. He is not a candidate for a total contact cast secondary to gait and balance issues 08/23/18-Patient returns at 2 weeks for his left heel wound. This appears to be slightly bigger in dimension. Less necrotic tissue than before. Continue to use Prisma. 8/10-Patient returns at 1 week to continue care for his left heel wound, this is looking better than before, definitely less necrotic tissue, continue to use Prisma 8/17; left plantar heel. Once again rolled up areas of skin and subcutaneous  tissue around the wound. Wound surface is not too bad. We have been using Prisma 8/24; left plantar heel. Absolutely no change to rolled up skin and subcutaneous tissue around the wound removed with a #5 curette we have been using silver collagen for a long period of time without any improvement although I think this is largely a pressure issue. I have changed him to [facility] today. Again we have talked about a total contact cast although this would add to his already left greater than right leg length discrepancy about an inch and a half according to the patient. 8/31; left plantar heel. Absolutely no change. Circular punched out wound with callus thick subcutaneous tissue around the wound margin. We have been using Hydrofera Blue 9/21; left plantar heel although the surface area of this is not changed the depth seems somewhat better. There is also not as much callus and thick subcutaneous tissue. We have been using Hydrofera Blue. He came in this day with a support on the right foot which is his shorter leg. We are using a heel offloading boot on the left. 9/28 left plantar heel. Using Hydrofera Blue. We have improvement in measurements but this is still a relatively punched-out area with evidence of excessive pressure 10/5; left plantar heel using Hydrofera Blue surface area measurements are not different however there is certainly less callus less rolled edges around the wound. No debridement today. I am continuing Hydrofera Blue for another week. There is no further option for offloading other than the heel offloading boot that he currently uses 10/12; no different from last week using Hydrofera Blue. Still requiring debridement. I believe this is an offloading issue 10/19; we switch to endoform last week. Surface area is improved and the wound certainly looks smaller from that surface area point of view but the depth is about the same. Extensive discussion today about a total contact  cast. His wife is present. She expressed both of their desire to get this wound healed and they want to try the cast. We went over risks including falls, fractures, problems with people with restless legs etc. etc. the bottom line is that we agreed to try this. He will be back on Thursday for the obligatory total contact cast change Objective Constitutional Sitting or standing Blood Pressure is within target range for patient.. Pulse regular and within target range for patient.Marland Kitchen Respirations regular, non-labored and within target range.. Temperature is normal and within the target range for the patient.Marland Kitchen Appears in no distress. Vitals Time Taken: 8:10 AM, Height: 76 in, Weight: 335 lbs, BMI: 40.8, Temperature: 98.1 F, Pulse: 79 bpm, Respiratory Rate: 18 breaths/min, Blood Pressure: 123/66 mmHg. Cardiovascular Pedal pulses palpable. General Notes: Wound exam; no erythema around the wound. He still has callus and thick skin  around the circumference although the depth is about the same. Using a #3 curette callus thick subcutaneous tissue removed from the wound margins. After extensive discussion, we agreed to put a total contact cast which was placed in the standard fashion. We will see him back in 3 days Integumentary (Hair, Skin) Wound #2 status is Open. Original cause of wound was Gradually Appeared. The wound is located on the Left Calcaneus. The wound measures 1cm length x 1cm width x 0.6cm depth; 0.785cm^2 area and 0.471cm^3 volume. There is Fat Layer (Subcutaneous Tissue) Exposed exposed. There is no tunneling or undermining noted. There is a medium amount of serosanguineous drainage noted. The wound margin is thickened. There is large (67-100%) pink, pale granulation within the wound bed. There is a small (1-33%) amount of necrotic tissue within the wound bed including Adherent Slough. Assessment Active Problems ICD-10 Non-pressure chronic ulcer of left heel and midfoot with fat  layer exposed Other idiopathic peripheral autonomic neuropathy Prediabetes Procedures Wound #2 Pre-procedure diagnosis of Wound #2 is a Neuropathic Ulcer-Non Diabetic located on the Left Calcaneus . There was a Excisional Skin/Subcutaneous Tissue Debridement with a total area of 1 sq cm performed by John Griffin., MD. With the following instrument(s): Curette to remove Viable and Non-Viable tissue/material. Material removed includes Callus and Subcutaneous Tissue and. No specimens were taken. A time out was conducted at 08:31, prior to the start of the procedure. A Minimum amount of bleeding was controlled with Pressure. The procedure was tolerated well with a pain level of 0 throughout and a pain level of 0 following the procedure. Post Debridement Measurements: 1cm length x 1cm width x 0.6cm depth; 0.471cm^3 volume. Character of Wound/Ulcer Post Debridement is improved. Post procedure Diagnosis Wound #2: Same as Pre-Procedure Pre-procedure diagnosis of Wound #2 is a Neuropathic Ulcer-Non Diabetic located on the Left Calcaneus . There was a Total Contact Cast Procedure by John Griffin., MD. Post procedure Diagnosis Wound #2: Same as Pre-Procedure Plan Follow-up Appointments: Return Appointment in: - Thursday for cast change Dressing Change Frequency: Wound #2 Left Calcaneus: Do not change entire dressing for one week. Skin Barriers/Peri-Wound Care: Wound #2 Left Calcaneus: Moisturizing lotion Wound Cleansing: Wound #2 Left Calcaneus: May shower with protection. Primary Wound Dressing: Wound #2 Left Calcaneus: Endoform - moisten with normal saline Secondary Dressing: Wound #2 Left Calcaneus: Foam Kerlix/Rolled Gauze Dry Gauze Drawtex Off-Loading: Total Contact Cast to Left Lower Extremity Other: - Gloped heel offloader sandal to left foot - extra felt for padding Total contact cast placed in the standard fashion after extensive discussion with the patient and his  wife. We continued to use silver collagen Electronic Signature(s) Signed: 11/08/2018 5:54:03 PM By: John Ham MD Entered By: John Griffin on 11/08/2018 09:03:57 -------------------------------------------------------------------------------- Total Contact Cast Details Patient Name: Date of Service: John Griffin, John Griffin 11/08/2018 8:00 AM Medical Record Number:[id number] Patient Account Number: [id number] Date of Birth/Sex: [birth year] (67 y.o. M) Treating RN: John Griffin Primary Care Provider: Christain Griffin Other Clinician: Referring Provider: Treating Provider/Extender:John Griffin, John Griffin, John Griffin in Treatment: 18 Total Contact Cast Applied for Wound Assessment: Wound #2 Left Calcaneus Performed By: Physician John Griffin., MD Post Procedure Diagnosis Same as Pre-procedure Electronic Signature(s) Signed: 11/08/2018 5:54:03 PM By: John Ham MD Entered By: John Griffin on 11/08/2018 09:00:14 -------------------------------------------------------------------------------- SuperBill Details Patient Name: Date of Service: John Griffin, John Griffin 11/08/2018 Medical Record Number:[id number] Patient Account Number: [id number] Date of Birth/Sex: Treating RN: 10-06-1950 (68 y.o. 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 G90.09 Other idiopathic peripheral autonomic neuropathy R73.03 Prediabetes Facility Procedures CPT4 Code Description: [id number] - DEB SUBQ TISSUE 20 SQ CM/< ICD-10 Diagnosis Description L97.422 Non-pressure chronic ulcer of left heel and midfoot with Modifier: fat layer ex Quantity: 1 posed Physician Procedures CPT4 Code Description: [id number] - WC PHYS SUBQ TISS 20 SQ CM ICD-10  Diagnosis Description L97.422 Non-pressure chronic ulcer of left heel and midfoot with Modifier: fat layer ex Quantity: 1 posed Electronic Signature(s) Signed: 11/08/2018 5:54:03 PM By: John Ham MD Entered By: John Griffin on 11/08/2018 09:04:16

## 2018-11-11 ENCOUNTER — Encounter (HOSPITAL_BASED_OUTPATIENT_CLINIC_OR_DEPARTMENT_OTHER): Payer: Medicare Other | Admitting: Internal Medicine

## 2018-11-11 ENCOUNTER — Other Ambulatory Visit: Payer: Self-pay

## 2018-11-11 DIAGNOSIS — G4730 Sleep apnea, unspecified: Secondary | ICD-10-CM | POA: Diagnosis not present

## 2018-11-11 DIAGNOSIS — R7303 Prediabetes: Secondary | ICD-10-CM | POA: Diagnosis not present

## 2018-11-11 DIAGNOSIS — G9009 Other idiopathic peripheral autonomic neuropathy: Secondary | ICD-10-CM | POA: Diagnosis not present

## 2018-11-11 DIAGNOSIS — L97422 Non-pressure chronic ulcer of left heel and midfoot with fat layer exposed: Secondary | ICD-10-CM | POA: Diagnosis not present

## 2018-11-11 DIAGNOSIS — J449 Chronic obstructive pulmonary disease, unspecified: Secondary | ICD-10-CM | POA: Diagnosis not present

## 2018-11-11 DIAGNOSIS — S8012XA Contusion of left lower leg, initial encounter: Secondary | ICD-10-CM | POA: Diagnosis not present

## 2018-11-11 NOTE — Progress Notes (Signed)
MCHALE, HEINZMANN (GO:[id number]) Visit Report for 11/11/2018 HPI Details Patient Name: Date of Service: John Griffin, John Griffin 11/11/2018 7:30 AM Medical Record Number:[id number] Patient Account Number: [id number] Date of Birth/Sex: Treating RN: 11-06-1950 (68 y.o. [name] Primary Care Provider: Christain [place] Other Clinician: Referring Provider: Treating Provider/Extender:Robson, Glendale Chard, Letta Moynahan in Treatment: 18 History of Present Illness HPI Description: ADMISSION 11/19/2017 This is a 68 year old man who is not a diabetic. He tells me that 6 months ago he noticed a callused area on his left heel that open into a wound. This is on the plantar aspect of his heel just near the tip of his calcaneus. He says that this is open and closed repetitively over the last 6 months. He recently saw his primary doctor who sent him here. He is been using Neosporin and a Band-Aid. He tells me that he follows with cardiology and he had a hemoglobin A1c but he does not know what it is but he has been told he is not a diabetic. Measurement on the wound was 0.2 x 1.5 with some undermining distally in this oval-shaped horizontally orientated wound. He does not feel any pain He tells me that for an indefinite period of time he has not had much feeling in his feet. He has not had a medical work-up for this. Our intake nurses noted reduced microfilament sensation The patient has a history of COPD, hypercholesterolemia, coronary artery disease, gastroesophageal reflux disease, prostate cancer treated with surgery, obstructive sleep apnea. He tells [place] that he twisted his back last week and he has a back brace he is also using a cane ABI in our clinic was 1.17 on the left 11/26/2017; the patient arrives with his wound looking a lot better. He has been offloading this using a heel offloading boot. Dimensions are a lot better. 12/03/2017; the area on the tip of his heel extending slightly into the plantar  surface has no open area. Using a 5 curette and then a #10 scalpel I removed some callus but there is no open wound. I think this is going to be an offloading issue in a patient who has nondiabetic neuropathy. For now we are going to recommend a heel cup type heel protector and see if this will maintain skin integrity in this area. READMISSION 07/05/2018 Patient returns to clinic with a reopening of the left heel about a month and a half ago. He was in this clinic in the fall with a wound and roughly the same area. He has some form of idiopathic neuropathy not listed as a diabetic. Very ataxic wound. He also has leg length discrepancy secondary to a previous accident he had and according to his wife a lot of the pressure when he takes a step lands on the left heel. They have been using Neosporin more recently some leftover silver collagen. They did go into a heel offloading boot that we gave him last time. Past medical history as noted he is not a diabetic, he has idiopathic peripheral neuropathy, hypertension, coronary artery disease, obstructive sleep apnea on CPAP, prostate CA COPD. He is a heavy smoker ABI in our clinic was 1.05 on the left 6/22; x-ray I did last time showed no evidence of osteomyelitis. A wound culture showed Alcaligenes faecalis. I am not well familiar with this organism although I will try to look it up in more detail. In any case the wound is somewhat worse today with a small probing area off the base of the wound.  Also problematic is that he does not seem to wear the heel offloading boot properly. We have emphasized that his foot has to be all the way forward, otherwise the heel will be on the floor. 6/29; the patient is completing his antibiotics. Depth of this is come in somewhat. He has been to see his primary doctor and diagnosed with prediabetes now on metformin 500 twice daily. 1 would wonder about the etiology of his neuropathy. They are making a better effort to  use the heel offloading boot properly. He has significant gait ataxia I would be concerned about a total contact cast 7/6- Completed cipro, he continues to use the offloading shoe, he does have balance issues with right leg being shorter, has been changing dressing on heel every other day 7/13; he is using his offloading shoe. Some improvement in length and width and depth. However this is a deep punched out wound on the left plantar heel. His right leg is shorter and his balance is ataxic secondary to a neuropathy. He is prediabetic on metformin. It would be difficult to see him in a total contact cast although he assures me along with his wife that they were doing as much pressure relief short of this they can 7/20; he is still in his heel offloading shoe. Absolutely no difference this week. Thick callus skin and subcutaneous tissue around the deep punched out wound. Required an extensive debridement. We have been using Prisma 7/27; he is still on his heel offloading shoe and according to the patient and his wife he is religiously offloading this. He has a little less depth to the wound certainly no change in the surface area. He continues to have thick callus skin and subcutaneous tissue around the wound edge which I continuously have to debride including today. If we can get this to a more superficial area that would be success. He is using silver collagen. He is not a candidate for a total contact cast secondary to gait and balance issues 08/23/18-Patient returns at 69 weeks for his left heel wound. This appears to be slightly bigger in dimension. Less necrotic tissue than before. Continue to use Prisma. 8/10-Patient returns at 1 week to continue care for his left heel wound, this is looking better than before, definitely less necrotic tissue, continue to use Prisma 8/17; left plantar heel. Once again rolled up areas of skin and subcutaneous tissue around the wound. Wound surface is not too  bad. We have been using Prisma 8/24; left plantar heel. Absolutely no change to rolled up skin and subcutaneous tissue around the wound removed with a #5 curette we have been using silver collagen for a long period of time without any improvement although I think this is largely a pressure issue. I have changed him to [facility] today. Again we have talked about a total contact cast although this would add to his already left greater than right leg length discrepancy about an inch and a half according to the patient. 8/31; left plantar heel. Absolutely no change. Circular punched out wound with callus thick subcutaneous tissue around the wound margin. We have been using Hydrofera Blue 9/21; left plantar heel although the surface area of this is not changed the depth seems somewhat better. There is also not as much callus and thick subcutaneous tissue. We have been using Hydrofera Blue. He came in this day with a support on the right foot which is his shorter leg. We are using a heel offloading boot on  the left. 9/28 left plantar heel. Using Hydrofera Blue. We have improvement in measurements but this is still a relatively punched-out area with evidence of excessive pressure 10/5; left plantar heel using Hydrofera Blue surface area measurements are not different however there is certainly less callus less rolled edges around the wound. No debridement today. I am continuing Hydrofera Blue for another week. There is no further option for offloading other than the heel offloading boot that he currently uses 10/12; no different from last week using Hydrofera Blue. Still requiring debridement. I believe this is an offloading issue 10/19; we switch to endoform last week. Surface area is improved and the wound certainly looks smaller from that surface area point of view but the depth is about the same. Extensive discussion today about a total contact cast. His wife is present. She expressed both of  their desire to get this wound healed and they want to try the cast. We went over risks including falls, fractures, problems with people with restless legs etc. etc. the bottom line is that we agreed to try this. He will be back on Thursday for the obligatory total contact cast change 10/22; back for the obligatory total contact cast change. He had an injury and a new open area at the top of the cast over the anterior tibia. This is superficial. Apparently this happened the next day after we put it on but he would not allow his wife to call and tell us. He cut out the area of the cast. Fortunately the wound that we have been following him for looks better. This is on the left heel more superficial healthier looking granulation. Using silver collagen Electronic Signature(s) Signed: 11/11/2018 6:29:04 PM By: Linton Ham MD Entered By: Linton Ham on 11/11/2018 09:02:38 -------------------------------------------------------------------------------- Physical Exam Details Patient Name: Date of Service: John Griffin, John Griffin 11/11/2018 7:30 AM Medical Record Number:[id number] Patient Account Number: [id number] Date of Birth/Sex: Treating RN: 11-12-1950 (68 y.o. [name] Primary Care Provider: Christain [place] Other Clinician: Referring Provider: Treating Provider/Extender:Robson, [place] Chard, Letta Moynahan in Treatment: 18 Notes Wound exam; he has no erythema around the wound. Granulation at the bottom of this looks healthier. UNFORTUNATELY had a superficial area at the top of the cast no doubt a cast injury. No evidence of infection here. Electronic Signature(s) Signed: 11/11/2018 6:29:04 PM By: Linton Ham MD Entered By: Linton Ham on 11/11/2018 09:03:21 -------------------------------------------------------------------------------- Physician Orders Details Patient Name: Date of Service: John Griffin, John Griffin 11/11/2018 7:30 AM Medical Record Number:[id number] Patient  Account Number: [id number] Date of Birth/Sex: Treating RN: [birth year] (68 y.o. [name] Primary Care Provider: Christain [place] Other Clinician: Referring Provider: Treating Provider/Extender:Robson, [place] Chard, Letta Moynahan in Treatment: 18 Verbal / Phone Orders: No Diagnosis Coding ICD-10 Coding Code Description [phone] Non-pressure chronic ulcer of left heel and midfoot with fat layer exposed G90.09 Other idiopathic peripheral autonomic neuropathy R73.03 Prediabetes Follow-up Appointments Return Appointment in: - Patient to keep scheduled appointment for this Monday 11/15/2018. Dressing Change Frequency Wound #2 Left Calcaneus Do not change entire dressing for one week. Skin Barriers/Peri-Wound Care Wound #2 Left Calcaneus Moisturizing lotion Wound Cleansing Wound #2 Left Calcaneus May shower with protection. Primary Wound Dressing Wound #2 Left Calcaneus Endoform - moisten with normal saline Wound #3 Left,Anterior Lower Leg Calcium Alginate Secondary Dressing Wound #2 Left Calcaneus Foam Kerlix/Rolled Gauze Dry Gauze Drawtex Wound #3 Left,Anterior Lower Leg Foam - pad up anterior lower leg. Off-Loading Total Contact Cast to Left Lower Extremity - Patient  to please call if the cast starts rubbing or becomes loose. Other: - Gloped heel offloader sandal to left foot - extra felt for padding Limit your walking and standing on your foot. Electronic Signature(s) Signed: 11/11/2018 6:29:04 PM By: Linton Ham MD Signed: 11/11/2018 6:35:09 PM By: Deon Pilling Entered By: Deon Pilling on 11/11/2018 08:19:59 -------------------------------------------------------------------------------- Problem List Details Patient Name: Date of Service: John Griffin, John Griffin 11/11/2018 7:30 AM Medical Record Number:[id number] Patient Account Number: [id number] Date of Birth/Sex: Treating RN: 01-18-1951 (68 y.o. [name] Primary Care Provider: Christain [place] Other  Clinician: Referring Provider: Treating Provider/Extender:Robson, Glendale Chard, Jama Flavors Weeks in Treatment: 18 Active Problems ICD-10 Evaluated Encounter Code Description Active Date Today Diagnosis L97.422 Non-pressure chronic ulcer of left heel and midfoot 07/05/2018 No Yes with fat layer exposed G90.09 Other idiopathic peripheral autonomic neuropathy 07/05/2018 No Yes R73.03 Prediabetes 07/19/2018 No Yes S80.12XD Contusion of left lower leg, subsequent encounter 11/11/2018 No Yes Inactive Problems Resolved Problems Electronic Signature(s) Signed: 11/11/2018 6:29:04 PM By: Linton Ham MD Entered By: Linton Ham on 11/11/2018 08:59:58 -------------------------------------------------------------------------------- Progress Note Details Patient Name: Date of Service: John Griffin, John Griffin 11/11/2018 7:30 AM Medical Record John Griffin Patient Account Number: [id number] Date of Birth/Sex: Treating RN: 1950/02/14 (68 y.o. [name] Primary Care Provider: Christain [place] Other Clinician: Referring Provider: Treating Provider/Extender:Robson, Glend[place] Chard, Letta Moynahan in Treatment: 18 Subjective History of Present Illness (HPI) ADMISSION 11/19/2017 This is a 68 year old man who is not a diabetic. He tells me that 6 months ago he noticed a callused area on his left heel that open into a wound. This is on the plantar aspect of his heel just near the tip of his calcaneus. He says that this is open and closed repetitively over the last 6 months. He recently saw his primary doctor who sent him here. He is been using Neosporin and a Band-Aid. He tells me that he follows with cardiology and he had a hemoglobin A1c but he does not know what it is but he has been told he is not a diabetic. Measurement on the wound was 0.2 x 1.5 with some undermining distally in this oval-shaped horizontally orientated wound. He does not feel any pain He tells me that for an indefinite period  of time he has not had much feeling in his feet. He has not had a medical work-up for this. Our intake nurses noted reduced microfilament sensation The patient has a history of COPD, hypercholesterolemia, coronary artery disease, gastroesophageal reflux disease, prostate cancer treated with surgery, obstructive sleep apnea. He tells [place] that he twisted his back last week and he has a back brace he is also using a cane ABI in our clinic was 1.17 on the left 11/26/2017; the patient arrives with his wound looking a lot better. He has been offloading this using a heel offloading boot. Dimensions are a lot better. 12/03/2017; the area on the tip of his heel extending slightly into the plantar surface has no open area. Using a 5 curette and then a #10 scalpel I removed some callus but there is no open wound. I think this is going to be an offloading issue in a patient who has nondiabetic neuropathy. For now we are going to recommend a heel cup type heel protector and see if this will maintain skin integrity in this area. READMISSION 07/05/2018 Patient returns to clinic with a reopening of the left heel about a month and a half ago. He was in this clinic in the fall  with a wound and roughly the same area. He has some form of idiopathic neuropathy not listed as a diabetic. Very ataxic wound. He also has leg length discrepancy secondary to a previous accident he had and according to his wife a lot of the pressure when he takes a step lands on the left heel. They have been using Neosporin more recently some leftover silver collagen. They did go into a heel offloading boot that we gave him last time. Past medical history as noted he is not a diabetic, he has idiopathic peripheral neuropathy, hypertension, coronary artery disease, obstructive sleep apnea on CPAP, prostate CA COPD. He is a heavy smoker ABI in our clinic was 1.05 on the left 6/22; x-ray I did last time showed no evidence of osteomyelitis. A  wound culture showed Alcaligenes faecalis. I am not well familiar with this organism although I will try to look it up in more detail. In any case the wound is somewhat worse today with a small probing area off the base of the wound. Also problematic is that he does not seem to wear the heel offloading boot properly. We have emphasized that his foot has to be all the way forward, otherwise the heel will be on the floor. 6/29; the patient is completing his antibiotics. Depth of this is come in somewhat. He has been to see his primary doctor and diagnosed with prediabetes now on metformin 500 twice daily. 1 would wonder about the etiology of his neuropathy. They are making a better effort to use the heel offloading boot properly. He has significant gait ataxia I would be concerned about a total contact cast 7/6- Completed cipro, he continues to use the offloading shoe, he does have balance issues with right leg being shorter, has been changing dressing on heel every other day 7/13; he is using his offloading shoe. Some improvement in length and width and depth. However this is a deep punched out wound on the left plantar heel. His right leg is shorter and his balance is ataxic secondary to a neuropathy. He is prediabetic on metformin. It would be difficult to see him in a total contact cast although he assures me along with his wife that they were doing as much pressure relief short of this they can 7/20; he is still in his heel offloading shoe. Absolutely no difference this week. Thick callus skin and subcutaneous tissue around the deep punched out wound. Required an extensive debridement. We have been using Prisma 7/27; he is still on his heel offloading shoe and according to the patient and his wife he is religiously offloading this. He has a little less depth to the wound certainly no change in the surface area. He continues to have thick callus skin and subcutaneous tissue around the wound edge  which I continuously have to debride including today. If we can get this to a more superficial area that would be success. He is using silver collagen. He is not a candidate for a total contact cast secondary to gait and balance issues 08/23/18-Patient returns at 69 weeks for his left heel wound. This appears to be slightly bigger in dimension. Less necrotic tissue than before. Continue to use Prisma. 8/10-Patient returns at 1 week to continue care for his left heel wound, this is looking better than before, definitely less necrotic tissue, continue to use Prisma 8/17; left plantar heel. Once again rolled up areas of skin and subcutaneous tissue around the wound. Wound surface is not too bad.  We have been using Prisma 8/24; left plantar heel. Absolutely no change to rolled up skin and subcutaneous tissue around the wound removed with a #5 curette we have been using silver collagen for a long period of time without any improvement although I think this is largely a pressure issue. I have changed him to [facility] today. Again we have talked about a total contact cast although this would add to his already left greater than right leg length discrepancy about an inch and a half according to the patient. 8/31; left plantar heel. Absolutely no change. Circular punched out wound with callus thick subcutaneous tissue around the wound margin. We have been using Hydrofera Blue 9/21; left plantar heel although the surface area of this is not changed the depth seems somewhat better. There is also not as much callus and thick subcutaneous tissue. We have been using Hydrofera Blue. He came in this day with a support on the right foot which is his shorter leg. We are using a heel offloading boot on the left. 9/28 left plantar heel. Using Hydrofera Blue. We have improvement in measurements but this is still a relatively punched-out area with evidence of excessive pressure 10/5; left plantar heel using  Hydrofera Blue surface area measurements are not different however there is certainly less callus less rolled edges around the wound. No debridement today. I am continuing Hydrofera Blue for another week. There is no further option for offloading other than the heel offloading boot that he currently uses 10/12; no different from last week using Hydrofera Blue. Still requiring debridement. I believe this is an offloading issue 10/19; we switch to endoform last week. Surface area is improved and the wound certainly looks smaller from that surface area point of view but the depth is about the same. Extensive discussion today about a total contact cast. His wife is present. She expressed both of their desire to get this wound healed and they want to try the cast. We went over risks including falls, fractures, problems with people with restless legs etc. etc. the bottom line is that we agreed to try this. He will be back on Thursday for the obligatory total contact cast change 10/22; back for the obligatory total contact cast change. He had an injury and a new open area at the top of the cast over the anterior tibia. This is superficial. Apparently this happened the next day after we put it on but he would not allow his wife to call and tell us. He cut out the area of the cast. Fortunately the wound that we have been following him for looks better. This is on the left heel more superficial healthier looking granulation. Using silver collagen Objective Constitutional Vitals Time Taken: 7:42 AM, Height: 76 in, Weight: 335 lbs, BMI: 40.8, Temperature: 98.5 F, Pulse: 88 bpm, Respiratory Rate: 18 breaths/min, Blood Pressure: 121/67 mmHg. Integumentary (Hair, Skin) Wound #2 status is Open. Original cause of wound was Gradually Appeared. The wound is located on the Left Calcaneus. The wound measures 0.8cm length x 0.8cm width x 0.7cm depth; 0.503cm^2 area and 0.352cm^3 volume. There is Fat Layer  (Subcutaneous Tissue) Exposed exposed. There is no tunneling or undermining noted. There is a medium amount of serosanguineous drainage noted. The wound margin is thickened. There is large (67- 100%) pink granulation within the wound bed. There is a small (1-33%) amount of necrotic tissue within the wound bed including Adherent Slough. Wound #3 status is Open. Original cause of wound was  Shear/Friction. The wound is located on the Left,Anterior Lower Leg. The wound measures 2cm length x 1.8cm width x 0.1cm depth; 2.827cm^2 area and 0.283cm^3 volume. There is Fat Layer (Subcutaneous Tissue) Exposed exposed. There is no tunneling or undermining noted. There is a small amount of serosanguineous drainage noted. The wound margin is flat and intact. There is large (67-100%) red granulation within the wound bed. There is no necrotic tissue within the wound bed. Assessment Active Problems ICD-10 Non-pressure chronic ulcer of left heel and midfoot with fat layer exposed Other idiopathic peripheral autonomic neuropathy Prediabetes Contusion of left lower leg, subsequent encounter Procedures Wound #2 Pre-procedure diagnosis of Wound #2 is a Neuropathic Ulcer-Non Diabetic located on the Left Calcaneus . There was a Total Contact Cast Procedure by Ricard Dillon., MD. Post procedure Diagnosis Wound #2: Same as Pre-Procedure Plan Follow-up Appointments: Return Appointment in: - Patient to keep scheduled appointment for this Monday 11/15/2018. Dressing Change Frequency: Wound #2 Left Calcaneus: Do not change entire dressing for one week. Skin Barriers/Peri-Wound Care: Wound #2 Left Calcaneus: Moisturizing lotion Wound Cleansing: Wound #2 Left Calcaneus: May shower with protection. Primary Wound Dressing: Wound #2 Left Calcaneus: Endoform - moisten with normal saline Wound #3 Left,Anterior Lower Leg: Calcium Alginate Secondary Dressing: Wound #2 Left Calcaneus: Foam Kerlix/Rolled  Gauze Dry Gauze Drawtex Wound #3 Left,Anterior Lower Leg: Foam - pad up anterior lower leg. Off-Loading: Total Contact Cast to Left Lower Extremity - Patient to please call if the cast starts rubbing or becomes loose. Other: - Gloped heel offloader sandal to left foot - extra felt for padding Limit your walking and standing on your foot. 1. Left calcaneus continue silver collagen. In spite of my concerns about the total contact cast, in a short period of time his wound looks better therefore I have elected to try this again. He will be back in in 3 days to have [place] look at this and make sure the cast is not causing further problems 2. Cast injury. I was very concerned about this vis--vis continuing the cast until I saw the improvement in the heel 1 and with his permission we have reapplied the total contact cast Electronic Signature(s) Signed: 11/11/2018 6:29:04 PM By: Linton Ham MD Entered By: Linton Ham on 11/11/2018 09:04:44 -------------------------------------------------------------------------------- Total Contact Cast Details Patient Name: Date of Service: John Griffin, John Griffin 11/11/2018 7:30 AM Medical Record Number:[id number] Patient Account Number: [id number] Date of Birth/Sex: 02-28-1950 (67 y.o. M) Treating RN: Deon Pilling Primary Care Provider: Christain [place] Other Clinician: Referring Provider: Treating Provider/Extender:Robson, [place] Chard, Letta Moynahan in Treatment: 18 Total Contact Cast Applied for Wound Assessment: Wound #2 Left Calcaneus Performed By: Physician Ricard Dillon., MD Post Procedure Diagnosis Same as Pre-procedure Electronic Signature(s) Signed: 11/11/2018 6:29:04 PM By: Linton Ham MD Entered By: Linton Ham on 11/11/2018 09:00:13 -------------------------------------------------------------------------------- SuperBill Details Patient Name: Date of Service: John Griffin, John Griffin 11/11/2018 Medical Record [id number] Patient  Account Number: [id number] Date of Birth/Sex: Treating RN: 05-14-1950 (68 y.o. [name] Primary Care Provider: Christain [place] Other Clinician: Referring Provider: Treating Provider/Extender:Robson, [place] Chard, Letta Moynahan in Treatment: 18 Diagnosis Coding ICD-10 Codes Code Description [phone] Non-pressure chronic ulcer of left heel and midfoot with fat layer exposed G90.09 Other idiopathic peripheral autonomic neuropathy R73.03 Prediabetes Facility Procedures The patient participates with Medicare or their insurance follows the Medicare Facility Guidelines: CPT4 Code Description Modifier Quantity [id number] - WOUND CARE VISIT-LEV 3 EST PT 1 The patient participates with Medicare or their insurance follows  the Medicare Facility Guidelines: [id number] - APPLY TOTAL CONTACT LEG CAST 1 ICD-10 Diagnosis Description L97.422 Non-pressure chronic ulcer of left heel and midfoot with fat layer  exposed Physician Procedures Electronic Signature(s) Signed: 11/11/2018 6:29:04 PM By: Linton Ham MD Entered By: Linton Ham on 11/11/2018 09:04:59

## 2018-11-15 ENCOUNTER — Encounter (HOSPITAL_BASED_OUTPATIENT_CLINIC_OR_DEPARTMENT_OTHER): Payer: Medicare Other | Admitting: Internal Medicine

## 2018-11-15 ENCOUNTER — Other Ambulatory Visit: Payer: Self-pay

## 2018-11-15 DIAGNOSIS — J449 Chronic obstructive pulmonary disease, unspecified: Secondary | ICD-10-CM | POA: Diagnosis not present

## 2018-11-15 DIAGNOSIS — L97422 Non-pressure chronic ulcer of left heel and midfoot with fat layer exposed: Secondary | ICD-10-CM | POA: Diagnosis not present

## 2018-11-15 DIAGNOSIS — G4730 Sleep apnea, unspecified: Secondary | ICD-10-CM | POA: Diagnosis not present

## 2018-11-15 DIAGNOSIS — R7303 Prediabetes: Secondary | ICD-10-CM | POA: Diagnosis not present

## 2018-11-15 DIAGNOSIS — S8012XA Contusion of left lower leg, initial encounter: Secondary | ICD-10-CM | POA: Diagnosis not present

## 2018-11-15 DIAGNOSIS — G9009 Other idiopathic peripheral autonomic neuropathy: Secondary | ICD-10-CM | POA: Diagnosis not present

## 2018-11-15 NOTE — Progress Notes (Signed)
MORRIS, BERGSCHNEIDER ([id number]) Visit Report for 11/15/2018 HPI Details Patient Name: Date of Service: John Griffin, ABAYA 11/15/2018 8:00 AM Medical Record Number:[id number] Patient Account Number: [id number] Date of Birth/Sex: Treating RN: [birth year] (68 y.o. [name] Primary Care Provider: Christain [place] Other Clinician: Referring Provider: Treating Provider/Extender:Reynard Christoffersen, Glendale Chard, Letta Moynahan in Treatment: 19 History of Present Illness HPI Description: ADMISSION 11/19/2017 This is a 68 year old man who is not a diabetic. He tells me that 6 months ago he noticed a callused area on his left heel that open into a wound. This is on the plantar aspect of his heel just near the tip of his calcaneus. He says that this is open and closed repetitively over the last 6 months. He recently saw his primary doctor who sent him here. He is been using Neosporin and a Band-Aid. He tells me that he follows with cardiology and he had a hemoglobin A1c but he does not know what it is but he has been told he is not a diabetic. Measurement on the wound was 0.2 x 1.5 with some undermining distally in this oval-shaped horizontally orientated wound. He does not feel any pain He tells me that for an indefinite period of time he has not had much feeling in his feet. He has not had a medical work-up for this. Our intake nurses noted reduced microfilament sensation The patient has a history of COPD, hypercholesterolemia, coronary artery disease, gastroesophageal reflux disease, prostate cancer treated with surgery, obstructive sleep apnea. He tells [place] that he twisted his back last week and he has a back brace he is also using a cane ABI in our clinic was 1.17 on the left 11/26/2017; the patient arrives with his wound looking a lot better. He has been offloading this using a heel offloading boot. Dimensions are a lot better. 12/03/2017; the area on the tip of his heel extending slightly into the plantar  surface has no open area. Using a 5 curette and then a #10 scalpel I removed some callus but there is no open wound. I think this is going to be an offloading issue in a patient who has nondiabetic neuropathy. For now we are going to recommend a heel cup type heel protector and see if this will maintain skin integrity in this area. READMISSION 07/05/2018 Patient returns to clinic with a reopening of the left heel about a month and a half ago. He was in this clinic in the fall with a wound and roughly the same area. He has some form of idiopathic neuropathy not listed as a diabetic. Very ataxic wound. He also has leg length discrepancy secondary to a previous accident he had and according to his wife a lot of the pressure when he takes a step lands on the left heel. They have been using Neosporin more recently some leftover silver collagen. They did go into a heel offloading boot that we gave him last time. Past medical history as noted he is not a diabetic, he has idiopathic peripheral neuropathy, hypertension, coronary artery disease, obstructive sleep apnea on CPAP, prostate CA COPD. He is a heavy smoker ABI in our clinic was 1.05 on the left 6/22; x-ray I did last time showed no evidence of osteomyelitis. A wound culture showed Alcaligenes faecalis. I am not well familiar with this organism although I will try to look it up in more detail. In any case the wound is somewhat worse today with a small probing area off the base of the wound.  Also problematic is that he does not seem to wear the heel offloading boot properly. We have emphasized that his foot has to be all the way forward, otherwise the heel will be on the floor. 6/29; the patient is completing his antibiotics. Depth of this is come in somewhat. He has been to see his primary doctor and diagnosed with prediabetes now on metformin 500 twice daily. 1 would wonder about the etiology of his neuropathy. They are making a better effort to  use the heel offloading boot properly. He has significant gait ataxia I would be concerned about a total contact cast 7/6- Completed cipro, he continues to use the offloading shoe, he does have balance issues with right leg being shorter, has been changing dressing on heel every other day 7/13; he is using his offloading shoe. Some improvement in length and width and depth. However this is a deep punched out wound on the left plantar heel. His right leg is shorter and his balance is ataxic secondary to a neuropathy. He is prediabetic on metformin. It would be difficult to see him in a total contact cast although he assures me along with his wife that they were doing as much pressure relief short of this they can 7/20; he is still in his heel offloading shoe. Absolutely no difference this week. Thick callus skin and subcutaneous tissue around the deep punched out wound. Required an extensive debridement. We have been using Prisma 7/27; he is still on his heel offloading shoe and according to the patient and his wife he is religiously offloading this. He has a little less depth to the wound certainly no change in the surface area. He continues to have thick callus skin and subcutaneous tissue around the wound edge which I continuously have to debride including today. If we can get this to a more superficial area that would be success. He is using silver collagen. He is not a candidate for a total contact cast secondary to gait and balance issues 08/23/18-Patient returns at 2 weeks for his left heel wound. This appears to be slightly bigger in dimension. Less necrotic tissue than before. Continue to use Prisma. 8/10-Patient returns at 1 week to continue care for his left heel wound, this is looking better than before, definitely less necrotic tissue, continue to use Prisma 8/17; left plantar heel. Once again rolled up areas of skin and subcutaneous tissue around the wound. Wound surface is not too  bad. We have been using Prisma 8/24; left plantar heel. Absolutely no change to rolled up skin and subcutaneous tissue around the wound removed with a #5 curette we have been using silver collagen for a long period of time without any improvement although I think this is largely a pressure issue. I have changed him to [facility] today. Again we have talked about a total contact cast although this would add to his already left greater than right leg length discrepancy about an inch and a half according to the patient. 8/31; left plantar heel. Absolutely no change. Circular punched out wound with callus thick subcutaneous tissue around the wound margin. We have been using Hydrofera Blue 9/21; left plantar heel although the surface area of this is not changed the depth seems somewhat better. There is also not as much callus and thick subcutaneous tissue. We have been using Hydrofera Blue. He came in this day with a support on the right foot which is his shorter leg. We are using a heel offloading boot on  the left. 9/28 left plantar heel. Using Hydrofera Blue. We have improvement in measurements but this is still a relatively punched-out area with evidence of excessive pressure 10/5; left plantar heel using Hydrofera Blue surface area measurements are not different however there is certainly less callus less rolled edges around the wound. No debridement today. I am continuing Hydrofera Blue for another week. There is no further option for offloading other than the heel offloading boot that he currently uses 10/12; no different from last week using Hydrofera Blue. Still requiring debridement. I believe this is an offloading issue 10/19; we switch to endoform last week. Surface area is improved and the wound certainly looks smaller from that surface area point of view but the depth is about the same. Extensive discussion today about a total contact cast. His wife is present. She expressed both of  their desire to get this wound healed and they want to try the cast. We went over risks including falls, fractures, problems with people with restless legs etc. etc. the bottom line is that we agreed to try this. He will be back on Thursday for the obligatory total contact cast change 10/22; back for the obligatory total contact cast change. He had an injury and a new open area at the top of the cast over the anterior tibia. This is superficial. Apparently this happened the next day after we put it on but he would not allow his wife to call and tell us. He cut out the area of the cast. Fortunately the wound that we have been following him for looks better. This is on the left heel more superficial healthier looking granulation. Using silver collagen 10/26; the wound on his left heel looks smaller in terms of surface area but the depth is still the same. This has come in fairly well however. The cast injury on the anterior tibia is about the same. We are using collagen on the heel silver alginate on the cast injury site on the left mid tibia area. He tells me he was up on his feet yesterday quite a bit because of car problems Electronic Signature(s) Signed: 11/15/2018 5:48:14 PM By: Linton Ham MD Entered By: Linton Ham on 11/15/2018 09:24:15 -------------------------------------------------------------------------------- Physical Exam Details Patient Name: Date of Service: AUBIN, MCCULLICK 11/15/2018 8:00 AM Medical Record Number:[id number] Patient Account Number: [id number] Date of Birth/Sex: Treating RN: 1950/03/22 (68 y.o. [name] Primary Care Provider: Christain [place] Other Clinician: Referring Provider: Treating Provider/Extender:Jhalen Eley, Glendale Chard, Letta Moynahan in Treatment: 19 Constitutional Sitting or standing Blood Pressure is within target range for patient.. Pulse regular and within target range for patient.Marland Kitchen Respirations regular, non-labored and within  target range.. Temperature is normal and within the target range for the patient.Marland Kitchen Appears in no distress. Notes Wound exam; left plantar heel. There is no erythema. Granulation at the base of the wound looks marginal there is probably some slough on this. I did not debride this is the area appears to be improving. Edges are thick which may require debridement but I elected to defer this and look at the surface area next week. The area on the superficial area at the top of the cast which was a cast injury looks about the same there is no evidence of infection Electronic Signature(s) Signed: 11/15/2018 5:48:14 PM By: Linton Ham MD Entered By: Linton Ham on 11/15/2018 09:26:55 -------------------------------------------------------------------------------- Physician Orders Details Patient Name: Date of Service: LUDIE, SCHRAUTH 11/15/2018 8:00 AM Medical Record Number:[id number] Patient Account Number: [id number] Date  of Birth/Sex: Treating RN: 01-23-1950 (68 y.o. [name] Primary Care Provider: Christain [place] Other Clinician: Referring Provider: Treating Provider/Extender:Rudine Rieger, Glendale Chard, Letta Moynahan in Treatment: 19 Verbal / Phone Orders: No Diagnosis Coding ICD-10 Coding Code Description [phone] Non-pressure chronic ulcer of left heel and midfoot with fat layer exposed G90.09 Other idiopathic peripheral autonomic neuropathy R73.03 Prediabetes S80.12XD Contusion of left lower leg, subsequent encounter Follow-up Appointments Return Appointment in 1 week. Dressing Change Frequency Wound #2 Left Calcaneus Do not change entire dressing for one week. Skin Barriers/Peri-Wound Care Wound #2 Left Calcaneus Moisturizing lotion Wound Cleansing Wound #2 Left Calcaneus May shower with protection. Primary Wound Dressing Wound #2 Left Calcaneus Endoform - moisten with normal saline Wound #3 Left,Anterior Lower Leg Calcium Alginate Secondary Dressing Wound #2  Left Calcaneus Foam Kerlix/Rolled Gauze Dry Gauze Drawtex Wound #3 Left,Anterior Lower Leg Foam - pad up anterior lower leg. Off-Loading Total Contact Cast to Left Lower Extremity - Patient to please call if the cast starts rubbing or becomes loose. Other: - Gloped heel offloader sandal to left foot - extra felt for padding Limit your walking and standing on your foot. Electronic Signature(s) Signed: 11/15/2018 5:48:14 PM By: Linton Ham MD Signed: 11/15/2018 6:04:10 PM By: Levan Hurst RN, BSN Entered By: Levan Hurst on 11/15/2018 08:39:54 -------------------------------------------------------------------------------- Problem List Details Patient Name: Date of Service: John Griffin, PECHE 11/15/2018 8:00 AM Medical Record Number:[id number] Patient Account Number: [id number] Date of Birth/Sex: Treating RN: 1950/05/01 (68 y.o. [name] Primary Care Provider: Christain [place] Other Clinician: Referring Provider: Treating Provider/Extender:Allyiah Gartner, Glendale Chard, Jama Flavors Weeks in Treatment: 19 Active Problems ICD-10 Evaluated Encounter Code Description Active Date Today Diagnosis L97.422 Non-pressure chronic ulcer of left heel and midfoot 07/05/2018 No Yes with fat layer exposed G90.09 Other idiopathic peripheral autonomic neuropathy 07/05/2018 No Yes R73.03 Prediabetes 07/19/2018 No Yes S80.12XD Contusion of left lower leg, subsequent encounter 11/11/2018 No Yes Inactive Problems Resolved Problems Electronic Signature(s) Signed: 11/15/2018 5:48:14 PM By: Linton Ham MD Entered By: Linton Ham on 11/15/2018 09:22:54 -------------------------------------------------------------------------------- Progress Note Details Patient Name: Date of Service: TADARIUS, AYLES 11/15/2018 8:00 AM Medical Record [id number] Patient Account Number: [id number] Date of Birth/Sex: Treating RN: Jun 28, 1950 (68 y.o. [name] Primary Care Provider: Christain [place] Other Clinician: Referring Provider: Treating Provider/Extender:Jakaree Pickard, Glendale Chard, Letta Moynahan in Treatment: 19 Subjective History of Present Illness (HPI) ADMISSION 11/19/2017 This is a 68 year old man who is not a diabetic. He tells me that 6 months ago he noticed a callused area on his left heel that open into a wound. This is on the plantar aspect of his heel just near the tip of his calcaneus. He says that this is open and closed repetitively over the last 6 months. He recently saw his primary doctor who sent him here. He is been using Neosporin and a Band-Aid. He tells me that he follows with cardiology and he had a hemoglobin A1c but he does not know what it is but he has been told he is not a diabetic. Measurement on the wound was 0.2 x 1.5 with some undermining distally in this oval-shaped horizontally orientated wound. He does not feel any pain He tells me that for an indefinite period of time he has not had much feeling in his feet. He has not had a medical work-up for this. Our intake nurses noted reduced microfilament sensation The patient has a history of COPD, hypercholesterolemia, coronary artery disease, gastroesophageal reflux disease, prostate cancer treated with surgery,  obstructive sleep apnea. He tells [place] that he twisted his back last week and he has a back brace he is also using a cane ABI in our clinic was 1.17 on the left 11/26/2017; the patient arrives with his wound looking a lot better. He has been offloading this using a heel offloading boot. Dimensions are a lot better. 12/03/2017; the area on the tip of his heel extending slightly into the plantar surface has no open area. Using a 5 curette and then a #10 scalpel I removed some callus but there is no open wound. I think this is going to be an offloading issue in a patient who has nondiabetic neuropathy. For now we are going to recommend a heel cup type heel protector and see if this will maintain skin  integrity in this area. READMISSION 07/05/2018 Patient returns to clinic with a reopening of the left heel about a month and a half ago. He was in this clinic in the fall with a wound and roughly the same area. He has some form of idiopathic neuropathy not listed as a diabetic. Very ataxic wound. He also has leg length discrepancy secondary to a previous accident he had and according to his wife a lot of the pressure when he takes a step lands on the left heel. They have been using Neosporin more recently some leftover silver collagen. They did go into a heel offloading boot that we gave him last time. Past medical history as noted he is not a diabetic, he has idiopathic peripheral neuropathy, hypertension, coronary artery disease, obstructive sleep apnea on CPAP, prostate CA COPD. He is a heavy smoker ABI in our clinic was 1.05 on the left 6/22; x-ray I did last time showed no evidence of osteomyelitis. A wound culture showed Alcaligenes faecalis. I am not well familiar with this organism although I will try to look it up in more detail. In any case the wound is somewhat worse today with a small probing area off the base of the wound. Also problematic is that he does not seem to wear the heel offloading boot properly. We have emphasized that his foot has to be all the way forward, otherwise the heel will be on the floor. 6/29; the patient is completing his antibiotics. Depth of this is come in somewhat. He has been to see his primary doctor and diagnosed with prediabetes now on metformin 500 twice daily. 1 would wonder about the etiology of his neuropathy. They are making a better effort to use the heel offloading boot properly. He has significant gait ataxia I would be concerned about a total contact cast 7/6- Completed cipro, he continues to use the offloading shoe, he does have balance issues with right leg being shorter, has been changing dressing on heel every other day 7/13; he is using  his offloading shoe. Some improvement in length and width and depth. However this is a deep punched out wound on the left plantar heel. His right leg is shorter and his balance is ataxic secondary to a neuropathy. He is prediabetic on metformin. It would be difficult to see him in a total contact cast although he assures me along with his wife that they were doing as much pressure relief short of this they can 7/20; he is still in his heel offloading shoe. Absolutely no difference this week. Thick callus skin and subcutaneous tissue around the deep punched out wound. Required an extensive debridement. We have been using Prisma 7/27; he is still on  his heel offloading shoe and according to the patient and his wife he is religiously offloading this. He has a little less depth to the wound certainly no change in the surface area. He continues to have thick callus skin and subcutaneous tissue around the wound edge which I continuously have to debride including today. If we can get this to a more superficial area that would be success. He is using silver collagen. He is not a candidate for a total contact cast secondary to gait and balance issues 08/23/18-Patient returns at 2 weeks for his left heel wound. This appears to be slightly bigger in dimension. Less necrotic tissue than before. Continue to use Prisma. 8/10-Patient returns at 1 week to continue care for his left heel wound, this is looking better than before, definitely less necrotic tissue, continue to use Prisma 8/17; left plantar heel. Once again rolled up areas of skin and subcutaneous tissue around the wound. Wound surface is not too bad. We have been using Prisma 8/24; left plantar heel. Absolutely no change to rolled up skin and subcutaneous tissue around the wound removed with a #5 curette we have been using silver collagen for a long period of time without any improvement although I think this is largely a pressure issue. I have changed  him to [facility] today. Again we have talked about a total contact cast although this would add to his already left greater than right leg length discrepancy about an inch and a half according to the patient. 8/31; left plantar heel. Absolutely no change. Circular punched out wound with callus thick subcutaneous tissue around the wound margin. We have been using Hydrofera Blue 9/21; left plantar heel although the surface area of this is not changed the depth seems somewhat better. There is also not as much callus and thick subcutaneous tissue. We have been using Hydrofera Blue. He came in this day with a support on the right foot which is his shorter leg. We are using a heel offloading boot on the left. 9/28 left plantar heel. Using Hydrofera Blue. We have improvement in measurements but this is still a relatively punched-out area with evidence of excessive pressure 10/5; left plantar heel using Hydrofera Blue surface area measurements are not different however there is certainly less callus less rolled edges around the wound. No debridement today. I am continuing Hydrofera Blue for another week. There is no further option for offloading other than the heel offloading boot that he currently uses 10/12; no different from last week using Hydrofera Blue. Still requiring debridement. I believe this is an offloading issue 10/19; we switch to endoform last week. Surface area is improved and the wound certainly looks smaller from that surface area point of view but the depth is about the same. Extensive discussion today about a total contact cast. His wife is present. She expressed both of their desire to get this wound healed and they want to try the cast. We went over risks including falls, fractures, problems with people with restless legs etc. etc. the bottom line is that we agreed to try this. He will be back on Thursday for the obligatory total contact cast change 10/22; back for the  obligatory total contact cast change. He had an injury and a new open area at the top of the cast over the anterior tibia. This is superficial. Apparently this happened the next day after we put it on but he would not allow his wife to call and tell us. He  cut out the area of the cast. Fortunately the wound that we have been following him for looks better. This is on the left heel more superficial healthier looking granulation. Using silver collagen 10/26; the wound on his left heel looks smaller in terms of surface area but the depth is still the same. This has come in fairly well however. The cast injury on the anterior tibia is about the same. We are using collagen on the heel silver alginate on the cast injury site on the left mid tibia area. He tells me he was up on his feet yesterday quite a bit because of car problems Objective Constitutional Sitting or standing Blood Pressure is within target range for patient.. Pulse regular and within target range for patient.Marland Kitchen Respirations regular, non-labored and within target range.. Temperature is normal and within the target range for the patient.Marland Kitchen Appears in no distress. Vitals Time Taken: 8:14 AM, Height: 76 in, Weight: 335 lbs, BMI: 40.8, Temperature: 98.4 F, Pulse: 79 bpm, Respiratory Rate: 18 breaths/min, Blood Pressure: 101/52 mmHg. General Notes: Wound exam; left plantar heel. There is no erythema. Granulation at the base of the wound looks marginal there is probably some slough on this. I did not debride this is the area appears to be improving. Edges are thick which may require debridement but I elected to defer this and look at the surface area next week. ooThe area on the superficial area at the top of the cast which was a cast injury looks about the same there is no evidence of infection Integumentary (Hair, Skin) Wound #2 status is Open. Original cause of wound was Gradually Appeared. The wound is located on the Left Calcaneus.  The wound measures 0.6cm length x 0.6cm width x 0.7cm depth; 0.283cm^2 area and 0.198cm^3 volume. There is Fat Layer (Subcutaneous Tissue) Exposed exposed. There is no tunneling or undermining noted. There is a medium amount of serosanguineous drainage noted. The wound margin is thickened. There is medium (34-66%) pink granulation within the wound bed. There is a medium (34-66%) amount of necrotic tissue within the wound bed including Adherent Slough. Wound #3 status is Open. Original cause of wound was Shear/Friction. The wound is located on the Left,Anterior Lower Leg. The wound measures 2cm length x 2.1cm width x 0.1cm depth; 3.299cm^2 area and 0.33cm^3 volume. There is Fat Layer (Subcutaneous Tissue) Exposed exposed. There is no tunneling or undermining noted. There is a small amount of serosanguineous drainage noted. The wound margin is flat and intact. There is large (67-100%) red granulation within the wound bed. There is no necrotic tissue within the wound bed. Assessment Active Problems ICD-10 Non-pressure chronic ulcer of left heel and midfoot with fat layer exposed Other idiopathic peripheral autonomic neuropathy Prediabetes Contusion of left lower leg, subsequent encounter Procedures Wound #2 Pre-procedure diagnosis of Wound #2 is a Neuropathic Ulcer-Non Diabetic located on the Left Calcaneus . There was a Total Contact Cast Procedure by Ricard Dillon., MD. Post procedure Diagnosis Wound #2: Same as Pre-Procedure Plan Follow-up Appointments: Return Appointment in 1 week. Dressing Change Frequency: Wound #2 Left Calcaneus: Do not change entire dressing for one week. Skin Barriers/Peri-Wound Care: Wound #2 Left Calcaneus: Moisturizing lotion Wound Cleansing: Wound #2 Left Calcaneus: May shower with protection. Primary Wound Dressing: Wound #2 Left Calcaneus: Endoform - moisten with normal saline Wound #3 Left,Anterior Lower Leg: Calcium Alginate Secondary  Dressing: Wound #2 Left Calcaneus: Foam Kerlix/Rolled Gauze Dry Gauze Drawtex Wound #3 Left,Anterior Lower Leg: Foam - pad up anterior  lower leg. Off-Loading: Total Contact Cast to Left Lower Extremity - Patient to please call if the cast starts rubbing or becomes loose. Other: - Gloped heel offloader sandal to left foot - extra felt for padding Limit your walking and standing on your foot. 1. Endoform to the heel 2. Silver alginate to the left anterior lower leg 3. Total contact cast again. He appears to be tolerating in terms of his balance Electronic Signature(s) Signed: 11/15/2018 5:48:14 PM By: Linton Ham MD Entered By: Linton Ham on 11/15/2018 [id number] -------------------------------------------------------------------------------- Total Contact Cast Details Patient Name: Date of Service: YACQUB, CICOTTE 11/15/2018 8:00 AM Medical Record Number:[id number] Patient Account Number: [id number] Date of Birth/Sex: 1950-06-12 (67 y.o. M) Treating RN: Levan Hurst Primary Care Provider: Christain Sacramento Other Clinician: Referring Provider: Treating Provider/Extender:Ashkan Chamberland, Glendale Chard, Letta Moynahan in Treatment: 19 Total Contact Cast Applied for Wound Assessment: Wound #2 Left Calcaneus Performed By: Physician Ricard Dillon., MD Post Procedure Diagnosis Same as Pre-procedure Electronic Signature(s) Signed: 11/15/2018 5:48:14 PM By: Linton Ham MD Entered By: Linton Ham on 11/15/2018 09:23:10 -------------------------------------------------------------------------------- SuperBill Details Patient Name: Date of Service: John Griffin, GARFIAS 11/15/2018 Medical Record Number:[id number] Patient Account Number: [id number] Date of Birth/Sex: Treating RN: 10-05-1950 (68 y.o. [name] Primary Care Provider: Christain Sacramento Other Clinician: Referring Provider: Treating Provider/Extender:Zaron Zwiefelhofer, Glendale Chard, Letta Moynahan in Treatment: 19 Diagnosis  Coding ICD-10 Codes Code Description [phone] Non-pressure chronic ulcer of left heel and midfoot with fat layer exposed G90.09 Other idiopathic peripheral autonomic neuropathy R73.03 Prediabetes S80.12XD Contusion of left lower leg, subsequent encounter Facility Procedures The patient participates with Medicare or their insurance follows the Medicare Facility Guidelines: CPT4 Code Description Modifier Quantity [id number] [phone] - APPLY TOTAL CONTACT LEG CAST 1 ICD-10 Diagnosis Description L97.422 Non-pressure chronic ulcer of  left heel and midfoot with fat layer exposed Physician Procedures CPT4 Code Description: [id number] - WC PHYS APPLY TOTAL CONTACT CAST ICD-10 Diagnosis Description L97.422 Non-pressure chronic ulcer of left heel and midfoot with fa Modifier: t layer expo Quantity: 1 sed Electronic Signature(s) Signed: 11/15/2018 5:48:14 PM By: Linton Ham MD Entered By: Linton Ham on 11/15/2018 09:27:50

## 2018-11-15 NOTE — Progress Notes (Signed)
John Griffin, John Griffin ([id number]) Visit Report for 11/11/2018 Arrival Information Details Patient Name: Date of Service: John Griffin, John Griffin 11/11/2018 7:30 AM Medical Record Number:[id number] Patient Account Number: [id number] Date of Birth/Sex: Treating RN: August 26, 1950 (68 y.o. John Griffin Primary Care De Libman: John Griffin Other Clinician: Referring John Griffin: Treating Avaley Coop/Extender:John Griffin in Treatment: 18 Visit Information History Since Last Visit Cane Added or deleted any medications: No Patient Arrived: 07:40 Any new allergies or adverse reactions: No Arrival Time: Had a fall or experienced change in No Accompanied By: wife None activities of daily living that may affect Transfer Assistance: risk of falls: Patient Identification Verified: Yes Signs or symptoms of abuse/neglect since No Secondary Verification Process Completed: Yes last visito Patient Requires Transmission-Based No Hospitalized since last visit: No Precautions: Implantable device outside of the clinic No Patient Has Alerts: No excluding cellular tissue based products placed in the center since last visit: Has Dressing in Place as Prescribed: Yes Has Footwear/Offloading in Place as Yes Prescribed: Left: Total Contact Cast Pain Present Now: No Electronic Signature(s) Signed: 11/12/2018 5:59:58 PM By: John Griffin Entered By: John Hurst on 11/11/2018 07:40:23 -------------------------------------------------------------------------------- Clinic Level of Care Assessment Details Patient Name: Date of Service: John Griffin 11/11/2018 7:30 AM Medical Record Number:[id number] Patient Account Number: [id number] Date of Birth/Sex: Treating RN: 07-Apr-1950 (68 y.o. [name], Meta.Reding Primary Care John Griffin: John Griffin Other Clinician: Referring John Griffin: Treating John Griffin:John Griffin in Treatment: 18 Clinic Level of Care  Assessment Items TOOL 3 Quantity Score X - Use when EandM and Procedure is performed on FOLLOW-UP visit 1 0 ASSESSMENTS - Nursing Assessment / Reassessment X - Reassessment of Co-morbidities (includes updates in patient status) 1 10 X - Reassessment of Adherence to Treatment Plan 1 5 ASSESSMENTS - Wound and Skin Assessment / Reassessment []  - Points for Wound Assessment can only be taken for a new wound of unknown or 0 different etiology and a procedure is NOT performed to that wound X - Simple Wound Assessment / Reassessment - one wound 1 5 []  - Complex Wound Assessment / Reassessment - multiple wounds 0 X - Dermatologic / Skin Assessment (not related to wound area) 1 10 ASSESSMENTS - Focused Assessment X - Circumferential Edema Measurements - multi extremities 1 5 X - Nutritional Assessment / Counseling / Intervention 1 10 []  - Lower Extremity Assessment (monofilament, tuning fork, pulses) 0 []  - Peripheral Arterial Disease Assessment (using hand held doppler) 0 ASSESSMENTS - Ostomy and/or Continence Assessment and Care []  - Incontinence Assessment and Management 0 []  - Ostomy Care Assessment and Management (repouching, etc.) 0 PROCESS - Coordination of Care []  - Points for Discharge Coordination can only be taken for a new wound of unknown or 0 different etiology and a procedure is NOT performed to that wound X - Simple Patient / Family Education for ongoing care 1 15 []  - Complex (extensive) Patient / Family Education for ongoing care 0 X - Staff obtains Programmer, systems, Records, Test Results / Process Orders 1 10 []  - Staff telephones HHA, Nursing Homes / Clarify orders / etc 0 []  - Routine Transfer to another Facility (non-emergent condition) 0 []  - Routine Hospital Admission (non-emergent condition) 0 []  - New Admissions / Biomedical engineer / Ordering NPWT, Apligraf, etc. 0 []  - Emergency Hospital Admission (emergent condition) 0 X - Simple Discharge Coordination 1 10 []  -  Complex (extensive) Discharge Coordination 0 PROCESS - Special Needs []  - Pediatric / Minor Patient Management 0 []  -  Isolation Patient Management 0 []  - Hearing / Language / Visual special needs 0 []  - Assessment of Community assistance (transportation, D/C planning, etc.) 0 []  - Additional assistance / Altered mentation 0 []  - Support Surface(s) Assessment (bed, cushion, seat, etc.) 0 INTERVENTIONS - Wound Cleansing / Measurement []  - Points for Wound Cleaning / Measurement, Wound Dressing, Specimen Collection 0 and Specimen taken to lab can only be taken for a new wound of unknown or different etiology and a procedure is NOT performed to that wound X - Simple Wound Cleansing - one wound 1 5 []  - Complex Wound Cleansing - multiple wounds 0 X - Wound Imaging (photographs - any number of wounds) 1 5 []  - Wound Tracing (instead of photographs) 0 X - Simple Wound Measurement - one wound 1 5 []  - Complex Wound Measurement - multiple wounds 0 INTERVENTIONS - Wound Dressings X - Small Wound Dressing one or multiple wounds 1 10 []  - Medium Wound Dressing one or multiple wounds 0 []  - Large Wound Dressing one or multiple wounds 0 INTERVENTIONS - Miscellaneous []  - External ear exam 0 []  - Specimen Collection (cultures, biopsies, blood, body fluids, etc.) 0 []  - Specimen(s) / Culture(s) sent or taken to Lab for analysis 0 []  - Patient Transfer (multiple staff / Civil Service fast streamer / Similar devices) 0 []  - Simple Staple / Suture removal (25 or less) 0 []  - Complex Staple / Suture removal (26 or more) 0 []  - Hypo / Hyperglycemic Management (close monitor of Blood Glucose) 0 []  - Ankle / Brachial Index (ABI) - do not check if billed separately 0 X - Vital Signs 1 5 Has the patient been seen at the hospital within the last three years: Yes Total Score: 110 Level Of Care: New/Established - Level 3 Electronic Signature(s) Signed: 11/11/2018 6:35:09 PM By: Deon Pilling Entered By: Deon Pilling on  11/11/2018 08:21:04 -------------------------------------------------------------------------------- Encounter Discharge Information Details Patient Name: Date of Service: John Griffin, John Griffin 11/11/2018 7:30 AM Medical Record [id number] Patient Account Number: [id number] Date of Birth/Sex: Treating RN: 1950-06-20 (68 y.o. Marvis Repress Primary Care Eyal Greenhaw: John Griffin Other Clinician: Referring Nalee Lightle: Treating Aris Even/Extender:John Griffin in Treatment: 18 Encounter Discharge Information Items Discharge Condition: Stable Ambulatory Status: Ambulatory Discharge Destination: Home Transportation: Private Auto Accompanied By: wife Schedule Follow-up Appointment: Yes Clinical Summary of Care: Patient Declined Electronic Signature(s) Signed: 11/15/2018 5:21:50 PM By: Kela Millin Entered By: Kela Millin on 11/11/2018 18:35:29 -------------------------------------------------------------------------------- Lower Extremity Assessment Details Patient Name: Date of Service: John Griffin, John Griffin 11/11/2018 7:30 AM Medical Record Number:[id number] Patient Account Number: [id number] Date of Birth/Sex: Treating RN: 03-15-1950 (68 y.o. John Griffin Primary Care Chyler Creely: John Griffin Other Clinician: Referring Stellan Vick: Treating Alline Pio/Extender:Robson, Glendale Chard, Jama Flavors Weeks in Treatment: 18 Edema Assessment Assessed: [Left: No] [Right: No] Edema: [Left: Yes] [Right: No] Calf Left: Right: Point of Measurement: 44 cm From Medial Instep 41.5 cm cm Ankle Left: Right: Point of Measurement: 11 cm From Medial Instep 25.8 cm cm Vascular Assessment Pulses: Dorsalis Pedis Palpable: [Left:Yes] Electronic Signature(s) Signed: 11/12/2018 5:59:58 PM By: John Griffin Entered By: John Hurst on 11/11/2018 07:50:09 -------------------------------------------------------------------------------- Multi Wound Chart  Details Patient Name: Date of Service: John Griffin, John Griffin 11/11/2018 7:30 AM Medical Record [id number] Patient Account Number: [id number] Date of Birth/Sex: Treating RN: 1951/01/15 (68 y.o. [name] Primary Care Maayan Jenning: John Griffin Other Clinician: Referring Azalynn Maxim: Treating Lamont Tant/Extender:Robson, Glendale Chard, Jama Flavors Weeks in Treatment: 18 Vital Signs Height(in): 76 Pulse(bpm): 88  Weight(lbs): 335 Blood Pressure(mmHg): 121/67 Body Mass Index(BMI): 41 Temperature(F): 98.5 Respiratory 18 Rate(breaths/min): Photos: [2:No Photos] [3:No Photos] [N/A:N/A] Wound Location: [2:Left Calcaneus] [3:Left Lower Leg - Anterior] [N/A:N/A] Wounding Event: [2:Gradually Appeared] [3:Shear/Friction] [N/A:N/A] Primary Etiology: [2:Neuropathic Ulcer-Non Diabetic] [3:Skin Tear] [N/A:N/A] Comorbid History: [2:Chronic Obstructive Pulmonary Disease (COPD), Sleep Apnea, Arrhythmia, Coronary Artery Arrhythmia, Coronary Artery Disease, Hypertension, Myocardial Infarction, Osteoarthritis] [3:Chronic Obstructive Pulmonary Disease (COPD), Sleep  Apnea, Disease, Hypertension, Myocardial Infarction, Osteoarthritis] [N/A:N/A] Date Acquired: [2:01/20/2017] [3:11/11/2018] [N/A:N/A] Weeks of Treatment: [2:18] [3:0] [N/A:N/A] Wound Status: [2:Open] [3:Open] [N/A:N/A] Measurements L x W x D 0.8x0.8x0.7 [3:2x1.8x0.1] [N/A:N/A] (cm) Area (cm) : [2:0.503] [3:2.827] [N/A:N/A] Volume (cm) : [2:0.352] [3:0.283] [N/A:N/A] % Reduction in Area: [2:68.00%] [3:N/A] [N/A:N/A] % Reduction in Volume: [2:85.10%] [3:N/A] [N/A:N/A] Classification: [2:Full Thickness Without Exposed Support Structures Exposed Support Structures] [3:Full Thickness Without] [N/A:N/A] Exudate Amount: [2:Medium] [3:Small] [N/A:N/A] Exudate Type: [2:Serosanguineous] [3:Serosanguineous] [N/A:N/A] Exudate Color: [2:red, brown] [3:red, brown] [N/A:N/A] Wound Margin: [2:Thickened] [3:Flat and Intact] [N/A:N/A] Granulation Amount:  [2:Large (67-100%)] [3:Large (67-100%)] [N/A:N/A] Granulation Quality: [2:Pink] [3:Red] [N/A:N/A] Necrotic Amount: [2:Small (1-33%)] [3:None Present (0%)] [N/A:N/A] Exposed Structures: [2:Fat Layer (Subcutaneous Fat Layer (Subcutaneous N/A Tissue) Exposed: Yes Fascia: No Tendon: No Muscle: No Joint: No Bone: No] [3:Tissue) Exposed: Yes Fascia: No Tendon: No Muscle: No Joint: No Bone: No] Epithelialization: [2:Small (1-33%) Total Contact Cast] [3:None N/A] [N/A:N/A N/A] Treatment Notes Electronic Signature(s) Signed: 11/11/2018 6:29:04 PM By: Linton Ham MD Signed: 11/11/2018 6:35:09 PM By: Deon Pilling Entered By: Linton Ham on 11/11/2018 09:00:06 -------------------------------------------------------------------------------- Multi-Disciplinary Care Plan Details Patient Name: Date of Service: John Griffin, John Griffin 11/11/2018 7:30 AM Medical Record Number:[id number] Patient Account Number: [id number] Date of Birth/Sex: Treating RN: [birth year] (67 y.o. [name], Tammi Klippel Primary Care Harjit Douds: John Griffin Other Clinician: Referring Tevis Conger: Treating Gelene Recktenwald/Extender:John Griffin in Treatment: 18 Active Inactive Wound/Skin Impairment Nursing Diagnoses: Impaired tissue integrity Knowledge deficit related to smoking impact on wound healing Knowledge deficit related to ulceration/compromised skin integrity Goals: Patient/caregiver will verbalize understanding of skin care regimen Date Initiated: 07/05/2018 Target Resolution Date: 11/26/2018 Goal Status: Active Ulcer/skin breakdown will have a volume reduction of 30% by week 4 Date Initiated: 07/05/2018 Date Inactivated: 08/02/2018 Target Resolution Date: 08/06/2018 Goal Status: Met Ulcer/skin breakdown will have a volume reduction of 50% by week 8 Date Initiated: 08/02/2018 Date Inactivated: 08/30/2018 Target Resolution Date: 09/03/2018 Goal Status: Met Ulcer/skin breakdown will have a volume reduction of  80% by week 12 Date Initiated: 08/30/2018 Date Inactivated: 10/11/2018 Target Resolution Date: 10/01/2018 Unmet Reason: unable to Goal Status: Unmet offload adequately Interventions: Assess patient/caregiver ability to obtain necessary supplies Assess patient/caregiver ability to perform ulcer/skin care regimen upon admission and as needed Assess ulceration(s) every visit Provide education on smoking Provide education on ulcer and skin care Notes: Electronic Signature(s) Signed: 11/11/2018 6:35:09 PM By: Deon Pilling Entered By: Deon Pilling on 11/11/2018 07:55:31 -------------------------------------------------------------------------------- Pain Assessment Details Patient Name: Date of Service: John Griffin, John Griffin 11/11/2018 7:30 AM Medical Record Number:[id number] Patient Account Number: [id number] Date of Birth/Sex: Treating RN: 28-Aug-1950 (68 y.o. John Griffin Primary Care Nataline Basara: John Griffin Other Clinician: Referring Lelani Garnett: Treating Ethyl Vila/Extender:John Griffin in Treatment: 18 Active Problems Location of Pain Severity and Description of Pain Patient Has Paino No Site Locations Pain Management and Medication Current Pain Management: Electronic Signature(s) Signed: 11/12/2018 5:59:58 PM By: John Griffin Entered By: John Hurst on 11/11/2018 07:40:29 -------------------------------------------------------------------------------- Patient/Caregiver Education Details Patient Name: [name] 10/22/2020andnbsp7:30 Date of Service: AM Medical Record  [id number] Number: Patient Account Number: [id number] Treating RN: Date of Birth/Gender: June 24, 1950 (68 y.o. [name]) Other Clinician: Primary Care Physician: John Griffin Treating Linton Ham Referring Physician: Physician/Extender: Mindi Junker in Treatment: 43 Education Assessment Education Provided To: Patient Education Topics  Provided Wound/Skin Impairment: Handouts: Caring for Your Ulcer Methods: Explain/Verbal Responses: Reinforcements needed Electronic Signature(s) Signed: 11/11/2018 6:35:09 PM By: Deon Pilling Entered By: Deon Pilling on 11/11/2018 07:55:43 -------------------------------------------------------------------------------- Wound Assessment Details Patient Name: Date of Service: John Griffin, John Griffin 11/11/2018 7:30 AM Medical Record Number:[id number] Patient Account Number: [id number] Date of Birth/Sex: Treating RN: 04/10/1950 (68 y.o. John Griffin Primary Care Nakima Fluegge: John Griffin Other Clinician: Referring Terryn Rosenkranz: Treating Latrel Szymczak/Extender:Robson, Glendale Chard, Jama Flavors Weeks in Treatment: 18 Wound Status Wound Number: 2 Primary Neuropathic Ulcer-Non Diabetic Etiology: Wound Location: Left Calcaneus Wound Open Wounding Event: Gradually Appeared Status: Date Acquired: 01/20/2017 Comorbid Chronic Obstructive Pulmonary Disease Weeks Of Treatment: 18 History: (COPD), Sleep Apnea, Arrhythmia, Coronary Clustered Wound: No Artery Disease, Hypertension, Myocardial Infarction, Osteoarthritis Photos Wound Measurements Length: (cm) 0.8 % Reduct Width: (cm) 0.8 % Reduct Depth: (cm) 0.7 Epitheli Area: (cm) 0.503 Tunneli Volume: (cm) 0.352 Undermi Wound Description Classification: Full Thickness Without Exposed Support Foul Odo Structures Slough/F Wound Thickened Margin: Exudate Medium Amount: Exudate Serosanguineous Type: Exudate red, brown Color: Wound Bed Granulation Amount: Large (67-100%) Granulation Quality: Pink Fascia E Necrotic Amount: Small (1-33%) Fat Laye Necrotic Quality: Adherent Slough Tendon E Muscle E Joint Ex Bone Exp r After Cleansing: No ibrino Yes Exposed Structure xposed: No r (Subcutaneous Tissue) Exposed: Yes xposed: No xposed: No posed: No osed: No ion in Area: 68% ion in Volume: 85.1% alization: Small (1-33%) ng: No ning:  No Electronic Signature(s) Signed: 11/11/2018 4:25:07 PM By: Mikeal Hawthorne EMT/HBOT Signed: 11/12/2018 5:59:58 PM By: John Griffin Entered By: Mikeal Hawthorne on 11/11/2018 15:22:57 -------------------------------------------------------------------------------- Wound Assessment Details Patient Name: Date of Service: John Griffin, John Griffin 11/11/2018 7:30 AM Medical Record [id number] Patient Account Number: [id number] Date of Birth/Sex: Treating RN: 1951-01-08 (68 y.o. John Griffin Primary Care Yarianna Varble: John Griffin Other Clinician: Referring Kenshin Splawn: Treating Tarren Velardi/Extender:Robson, Glendale Chard, Jama Flavors Weeks in Treatment: 18 Wound Status Wound Number: 3 Primary Skin Tear Etiology: Wound Location: Left Lower Leg - Anterior Wound Open Wounding Event: Shear/Friction Status: Date Acquired: 11/11/2018 Comorbid Chronic Obstructive Pulmonary Disease Weeks Of Treatment: 0 History: (COPD), Sleep Apnea, Arrhythmia, Coronary Clustered Wound: No Artery Disease, Hypertension, Myocardial Infarction, Osteoarthritis Photos Wound Measurements Length: (cm) 2 Width: (cm) 1.8 Depth: (cm) 0.1 Area: (cm) 2.827 Volume: (cm) 0.283 Wound Description Classification: Full Thickness Without Exposed Suppo Structures Wound Flat and Intact Margin: Exudate Small Amount: Exudate Serosanguineous Type: Exudate red, brown Color: Wound Bed Granulation Amount: Large (67-100%) Granulation Quality: Red Necrotic Amount: None Present (0%) Exposed Structure xposed: No r (Subcutaneous Tissue) Exposed: Yes xposed: No xposed: No ed: No d: No % Reduction in Area: 0% % Reduction in Volume: 0% Epithelialization: None Tunneling: No Undermining: No rt Fascia E Fat Laye Tendon E Muscle E Joint Expos Bone Expose Electronic Signature(s) Signed: 11/11/2018 4:25:07 PM By: Mikeal Hawthorne EMT/HBOT Signed: 11/12/2018 5:59:58 PM By: John Griffin Entered By: Mikeal Hawthorne on 11/11/2018 14:57:43 -------------------------------------------------------------------------------- Vitals Details Patient Name: Date of Service: John Griffin, John Griffin 11/11/2018 7:30 AM Medical Record [id number] Patient Account Number: [id number] Date of Birth/Sex: Treating RN: 12/10/1950 (68 y.o. John Griffin Primary Care Danniela Mcbrearty: John Griffin Other Clinician: Referring Korey Prashad: Treating Jenifer Struve/Extender:Robson, Glendale Chard, Jama Flavors Weeks in Treatment:  18 Vital Signs Time Taken: 07:42 Temperature (F): 98.5 Height (in): 76 Pulse (bpm): 88 Weight (lbs): 335 Respiratory Rate (breaths/min): 18 Body Mass Index (BMI): 40.8 Blood Pressure (mmHg): 121/67 Reference Range: 80 - 120 mg / dl Electronic Signature(s) Signed: 11/12/2018 5:59:58 PM By: John Griffin Entered By: John Hurst on 11/11/2018 07:42:39

## 2018-11-22 ENCOUNTER — Encounter (HOSPITAL_BASED_OUTPATIENT_CLINIC_OR_DEPARTMENT_OTHER): Payer: Medicare Other | Attending: Internal Medicine | Admitting: Internal Medicine

## 2018-11-22 ENCOUNTER — Other Ambulatory Visit: Payer: Self-pay

## 2018-11-22 DIAGNOSIS — I2510 Atherosclerotic heart disease of native coronary artery without angina pectoris: Secondary | ICD-10-CM | POA: Insufficient documentation

## 2018-11-22 DIAGNOSIS — Z7984 Long term (current) use of oral hypoglycemic drugs: Secondary | ICD-10-CM | POA: Insufficient documentation

## 2018-11-22 DIAGNOSIS — L97422 Non-pressure chronic ulcer of left heel and midfoot with fat layer exposed: Secondary | ICD-10-CM | POA: Diagnosis not present

## 2018-11-22 DIAGNOSIS — R7303 Prediabetes: Secondary | ICD-10-CM | POA: Diagnosis not present

## 2018-11-22 DIAGNOSIS — G4733 Obstructive sleep apnea (adult) (pediatric): Secondary | ICD-10-CM | POA: Insufficient documentation

## 2018-11-22 DIAGNOSIS — Z8546 Personal history of malignant neoplasm of prostate: Secondary | ICD-10-CM | POA: Diagnosis not present

## 2018-11-22 DIAGNOSIS — E7800 Pure hypercholesterolemia, unspecified: Secondary | ICD-10-CM | POA: Insufficient documentation

## 2018-11-22 DIAGNOSIS — R260 Ataxic gait: Secondary | ICD-10-CM | POA: Diagnosis not present

## 2018-11-22 DIAGNOSIS — J449 Chronic obstructive pulmonary disease, unspecified: Secondary | ICD-10-CM | POA: Insufficient documentation

## 2018-11-22 DIAGNOSIS — K219 Gastro-esophageal reflux disease without esophagitis: Secondary | ICD-10-CM | POA: Insufficient documentation

## 2018-11-22 DIAGNOSIS — G9009 Other idiopathic peripheral autonomic neuropathy: Secondary | ICD-10-CM | POA: Insufficient documentation

## 2018-11-22 DIAGNOSIS — I10 Essential (primary) hypertension: Secondary | ICD-10-CM | POA: Diagnosis not present

## 2018-11-25 NOTE — Progress Notes (Signed)
John Griffin, John Griffin ([id number]) Visit Report for 11/22/2018 Debridement Details Patient Name: Date of Service: John Griffin, John Griffin 11/22/2018 8:30 AM Medical Record Number:[id number] Patient Account Number: [id number] Date of Birth/Sex: Jul 24, 1950 (68 y.o. M) Treating RN: Levan Hurst Primary Care Provider: Christain Sacramento Other Clinician: Referring Provider: Treating Provider/Extender:Shoua Ressler, Glendale Chard, Letta Moynahan in Treatment: 20 Debridement Performed for Wound #2 Left Calcaneus Assessment: Performed By: Physician Ricard Dillon., MD Debridement Type: Debridement Level of Consciousness (Pre- Awake and Alert procedure): Pre-procedure Yes - 09:04 Verification/Time Out Taken: Start Time: 09:04 Pain Control: Other : bonzocaine, 20% Total Area Debrided (L x W): 0.5 (cm) x 0.7 (cm) = 0.35 (cm) Tissue and other material Viable, Non-Viable, Callus, Subcutaneous debrided: Level: Skin/Subcutaneous Tissue Debridement Description: Excisional Instrument: Curette Bleeding: Minimum End Time: 09:05 Procedural Pain: 0 Post Procedural Pain: 0 Response to Treatment: Procedure was tolerated well Level of Consciousness Awake and Alert (Post-procedure): Post Debridement Measurements of Total Wound Length: (cm) 0.7 Width: (cm) 0.7 Depth: (cm) 0.4 Volume: (cm) 0.154 Character of Wound/Ulcer Post Improved Debridement: Post Procedure Diagnosis Same as Pre-procedure Electronic Signature(s) Signed: 11/22/2018 5:57:33 PM By: Linton Ham MD Signed: 11/22/2018 6:00:40 PM By: Levan Hurst RN, BSN Entered By: Linton Ham on 11/22/2018 09:15:34 -------------------------------------------------------------------------------- HPI Details Patient Name: Date of Service: John Griffin, John Griffin 11/22/2018 8:30 AM Medical Record Number:[id number] Patient Account Number: [id number] Date of Birth/Sex: Treating RN: March 24, 1950 (68 y.o. [name] Primary Care Provider: Christain Sacramento Other  Clinician: Referring Provider: Treating Provider/Extender:Christine Schiefelbein, Glendale Chard, Letta Moynahan in Treatment: 20 History of Present Illness HPI Description: ADMISSION 11/19/2017 This is a 68 year old man who is not a diabetic. He tells me that 6 months ago he noticed a callused area on his left heel that open into a wound. This is on the plantar aspect of his heel just near the tip of his calcaneus. He says that this is open and closed repetitively over the last 6 months. He recently saw his primary doctor who sent him here. He is been using Neosporin and a Band-Aid. He tells me that he follows with cardiology and he had a hemoglobin A1c but he does not know what it is but he has been told he is not a diabetic. Measurement on the wound was 0.2 x 1.5 with some undermining distally in this oval-shaped horizontally orientated wound. He does not feel any pain He tells me that for an indefinite period of time he has not had much feeling in his feet. He has not had a medical work-up for this. Our intake nurses noted reduced microfilament sensation The patient has a history of COPD, hypercholesterolemia, coronary artery disease, gastroesophageal reflux disease, prostate cancer treated with surgery, obstructive sleep apnea. He tells [place] that he twisted his back last week and he has a back brace he is also using a cane ABI in our clinic was 1.17 on the left 11/26/2017; the patient arrives with his wound looking a lot better. He has been offloading this using a heel offloading boot. Dimensions are a lot better. 12/03/2017; the area on the tip of his heel extending slightly into the plantar surface has no open area. Using a 5 curette and then a #10 scalpel I removed some callus but there is no open wound. I think this is going to be an offloading issue in a patient who has nondiabetic neuropathy. For now we are going to recommend a heel cup type heel protector and see if this will maintain skin integrity  in this  area. READMISSION 07/05/2018 Patient returns to clinic with a reopening of the left heel about a month and a half ago. He was in this clinic in the fall with a wound and roughly the same area. He has some form of idiopathic neuropathy not listed as a diabetic. Very ataxic wound. He also has leg length discrepancy secondary to a previous accident he had and according to his wife a lot of the pressure when he takes a step lands on the left heel. They have been using Neosporin more recently some leftover silver collagen. They did go into a heel offloading boot that we gave him last time. Past medical history as noted he is not a diabetic, he has idiopathic peripheral neuropathy, hypertension, coronary artery disease, obstructive sleep apnea on CPAP, prostate CA COPD. He is a heavy smoker ABI in our clinic was 1.05 on the left 6/22; x-ray I did last time showed no evidence of osteomyelitis. A wound culture showed Alcaligenes faecalis. I am not well familiar with this organism although I will try to look it up in more detail. In any case the wound is somewhat worse today with a small probing area off the base of the wound. Also problematic is that he does not seem to wear the heel offloading boot properly. We have emphasized that his foot has to be all the way forward, otherwise the heel will be on the floor. 6/29; the patient is completing his antibiotics. Depth of this is come in somewhat. He has been to see his primary doctor and diagnosed with prediabetes now on metformin 500 twice daily. 1 would wonder about the etiology of his neuropathy. They are making a better effort to use the heel offloading boot properly. He has significant gait ataxia I would be concerned about a total contact cast 7/6- Completed cipro, he continues to use the offloading shoe, he does have balance issues with right leg being shorter, has been changing dressing on heel every other day 7/13; he is using his  offloading shoe. Some improvement in length and width and depth. However this is a deep punched out wound on the left plantar heel. His right leg is shorter and his balance is ataxic secondary to a neuropathy. He is prediabetic on metformin. It would be difficult to see him in a total contact cast although he assures me along with his wife that they were doing as much pressure relief short of this they can 7/20; he is still in his heel offloading shoe. Absolutely no difference this week. Thick callus skin and subcutaneous tissue around the deep punched out wound. Required an extensive debridement. We have been using Prisma 7/27; he is still on his heel offloading shoe and according to the patient and his wife he is religiously offloading this. He has a little less depth to the wound certainly no change in the surface area. He continues to have thick callus skin and subcutaneous tissue around the wound edge which I continuously have to debride including today. If we can get this to a more superficial area that would be success. He is using silver collagen. He is not a candidate for a total contact cast secondary to gait and balance issues 08/23/18-Patient returns at 2 weeks for his left heel wound. This appears to be slightly bigger in dimension. Less necrotic tissue than before. Continue to use Prisma. 8/10-Patient returns at 1 week to continue care for his left heel wound, this is looking better than before, definitely less necrotic  tissue, continue to use Prisma 8/17; left plantar heel. Once again rolled up areas of skin and subcutaneous tissue around the wound. Wound surface is not too bad. We have been using Prisma 8/24; left plantar heel. Absolutely no change to rolled up skin and subcutaneous tissue around the wound removed with a #5 curette we have been using silver collagen for a long period of time without any improvement although I think this is largely a pressure issue. I have changed him  to [facility] today. Again we have talked about a total contact cast although this would add to his already left greater than right leg length discrepancy about an inch and a half according to the patient. 8/31; left plantar heel. Absolutely no change. Circular punched out wound with callus thick subcutaneous tissue around the wound margin. We have been using Hydrofera Blue 9/21; left plantar heel although the surface area of this is not changed the depth seems somewhat better. There is also not as much callus and thick subcutaneous tissue. We have been using Hydrofera Blue. He came in this day with a support on the right foot which is his shorter leg. We are using a heel offloading boot on the left. 9/28 left plantar heel. Using Hydrofera Blue. We have improvement in measurements but this is still a relatively punched-out area with evidence of excessive pressure 10/5; left plantar heel using Hydrofera Blue surface area measurements are not different however there is certainly less callus less rolled edges around the wound. No debridement today. I am continuing Hydrofera Blue for another week. There is no further option for offloading other than the heel offloading boot that he currently uses 10/12; no different from last week using Hydrofera Blue. Still requiring debridement. I believe this is an offloading issue 10/19; we switch to endoform last week. Surface area is improved and the wound certainly looks smaller from that surface area point of view but the depth is about the same. Extensive discussion today about a total contact cast. His wife is present. She expressed both of their desire to get this wound healed and they want to try the cast. We went over risks including falls, fractures, problems with people with restless legs etc. etc. the bottom line is that we agreed to try this. He will be back on Thursday for the obligatory total contact cast change 10/22; back for the obligatory  total contact cast change. He had an injury and a new open area at the top of the cast over the anterior tibia. This is superficial. Apparently this happened the next day after we put it on but he would not allow his wife to call and tell us. He cut out the area of the cast. Fortunately the wound that we have been following him for looks better. This is on the left heel more superficial healthier looking granulation. Using silver collagen 10/26; the wound on his left heel looks smaller in terms of surface area but the depth is still the same. This has come in fairly well however. The cast injury on the anterior tibia is about the same. We are using collagen on the heel silver alginate on the cast injury site on the left mid tibia area. He tells me he was up on his feet yesterday quite a bit because of car problems 11/2; left heel wound perhaps some improvement in depth and less callus however not as dramatic changes I was hoping to see. The cast injury on the mid anterior tibia  looks better. Using silver alginate Electronic Signature(s) Signed: 11/22/2018 5:57:33 PM By: Linton Ham MD Entered By: Linton Ham on 11/22/2018 09:15:17 -------------------------------------------------------------------------------- Physical Exam Details Patient Name: Date of Service: John Griffin, John Griffin 11/22/2018 8:30 AM Medical Record Number:[id number] Patient Account Number: [id number] Date of Birth/Sex: Treating RN: 1950/09/04 (68 y.o. [name] Primary Care Provider: Christain [place] Other Clinician: Referring Provider: Treating Provider/Extender:Maecyn Panning, Glendale Chard, Letta Moynahan in Treatment: 34 Constitutional Patient is hypertensive.. Pulse regular and within target range for patient.Marland Kitchen Respirations regular, non-labored and within target range.. Temperature is normal and within the target range for the patient.Marland Kitchen Appears in no distress. Respiratory work of breathing is  normal. Cardiovascular No pulses palpable on the left foot. No uncontrolled edema. Integumentary (Hair, Skin) No evidence of infection in either wound area. Psychiatric appears at normal baseline. Notes Wound exam; left plantar heel no erythema. Debrided of callus skin and subcutaneous tissue and some debris over the surface of the wound the wound surface is not as viable as I was hoping to see. Cast injury on the left anterior mid tibia is smaller looks healthy Electronic Signature(s) Signed: 11/22/2018 5:57:33 PM By: Linton Ham MD Entered By: Linton Ham on 11/22/2018 09:17:07 -------------------------------------------------------------------------------- Physician Orders Details Patient Name: Date of Service: John Griffin, John Griffin 11/22/2018 8:30 AM Medical Record Number:[id number] Patient Account Number: [id number] Date of Birth/Sex: Treating RN: 1951-01-08 (68 y.o. Marvis Repress Primary Care Provider: Christain [place] Other Clinician: Referring Provider: Treating Provider/Extender:Josephus Harriger, Glendale Chard, Letta Moynahan in Treatment: 20 Verbal / Phone Orders: No Diagnosis Coding Follow-up Appointments Return Appointment in 1 week. Dressing Change Frequency Wound #2 Left Calcaneus Do not change entire dressing for one week. Skin Barriers/Peri-Wound Care Wound #2 Left Calcaneus Moisturizing lotion Wound Cleansing Wound #2 Left Calcaneus May shower with protection. Primary Wound Dressing Wound #2 Left Calcaneus Endoform - moisten with normal saline Wound #3 Left,Anterior Lower Leg Calcium Alginate Secondary Dressing Wound #2 Left Calcaneus Foam Kerlix/Rolled Gauze Dry Gauze Drawtex Wound #3 Left,Anterior Lower Leg Foam - pad up anterior lower leg. Off-Loading Total Contact Cast to Left Lower Extremity - Patient to please call if the cast starts rubbing or becomes loose. Other: - Gloped heel offloader sandal to left foot - extra felt for padding Limit your  walking and standing on your foot. Electronic Signature(s) Signed: 11/22/2018 5:57:33 PM By: Linton Ham MD Signed: 11/25/2018 5:16:49 PM By: Kela Millin Entered By: Kela Millin on 11/22/2018 08:18:41 -------------------------------------------------------------------------------- Problem List Details Patient Name: Date of Service: John Griffin, John Griffin 11/22/2018 8:30 AM Medical Record Number:[id number] Patient Account Number: [id number] Date of Birth/Sex: Treating RN: 1950-01-31 (68 y.o. 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 07/05/2018 No Yes with fat layer exposed G90.09 Other idiopathic peripheral autonomic neuropathy 07/05/2018 No Yes R73.03 Prediabetes 07/19/2018 No Yes S80.12XD Contusion of left lower leg, subsequent encounter 11/11/2018 No Yes Inactive Problems Resolved Problems Electronic Signature(s) Signed: 11/22/2018 5:57:33 PM By: Linton Ham MD Entered By: Linton Ham on 11/22/2018 09:13:03 -------------------------------------------------------------------------------- Progress Note Details Patient Name: Date of Service: John Griffin, John Griffin 11/22/2018 8:30 AM Medical Record [id number] Patient Account Number: [id number] Date of Birth/Sex: Treating RN: [birth year] (68 y.o. [name] Primary Care Provider: Christain [place] Other Clinician: Referring Provider: Treating Provider/Extender:Jazzmine Kleiman, Glendale Chard, Jama Flavors Weeks in Treatment: 20 Subjective History of  Present Illness (HPI) ADMISSION 11/19/2017 This is a 68 year old man who is not a diabetic. He tells me that 6 months ago he noticed a callused area on his left heel that open into a wound. This is on the  plantar aspect of his heel just near the tip of his calcaneus. He says that this is open and closed repetitively over the last 6 months. He recently saw his primary doctor who sent him here. He is been using Neosporin and a Band-Aid. He tells me that he follows with cardiology and he had a hemoglobin A1c but he does not know what it is but he has been told he is not a diabetic. Measurement on the wound was 0.2 x 1.5 with some undermining distally in this oval-shaped horizontally orientated wound. He does not feel any pain He tells me that for an indefinite period of time he has not had much feeling in his feet. He has not had a medical work-up for this. Our intake nurses noted reduced microfilament sensation The patient has a history of COPD, hypercholesterolemia, coronary artery disease, gastroesophageal reflux disease, prostate cancer treated with surgery, obstructive sleep apnea. He tells [place] that he twisted his back last week and he has a back brace he is also using a cane ABI in our clinic was 1.17 on the left 11/26/2017; the patient arrives with his wound looking a lot better. He has been offloading this using a heel offloading boot. Dimensions are a lot better. 12/03/2017; the area on the tip of his heel extending slightly into the plantar surface has no open area. Using a 5 curette and then a #10 scalpel I removed some callus but there is no open wound. I think this is going to be an offloading issue in a patient who has nondiabetic neuropathy. For now we are going to recommend a heel cup type heel protector and see if this will maintain skin integrity in this area. READMISSION 07/05/2018 Patient returns to clinic with a reopening of the left heel about a month and a half ago. He was in this clinic in the fall with a wound and roughly the same area. He has some form of idiopathic neuropathy not listed as a diabetic. Very ataxic wound. He also has leg length discrepancy secondary to a  previous accident he had and according to his wife a lot of the pressure when he takes a step lands on the left heel. They have been using Neosporin more recently some leftover silver collagen. They did go into a heel offloading boot that we gave him last time. Past medical history as noted he is not a diabetic, he has idiopathic peripheral neuropathy, hypertension, coronary artery disease, obstructive sleep apnea on CPAP, prostate CA COPD. He is a heavy smoker ABI in our clinic was 1.05 on the left 6/22; x-ray I did last time showed no evidence of osteomyelitis. A wound culture showed Alcaligenes faecalis. I am not well familiar with this organism although I will try to look it up in more detail. In any case the wound is somewhat worse today with a small probing area off the base of the wound. Also problematic is that he does not seem to wear the heel offloading boot properly. We have emphasized that his foot has to be all the way forward, otherwise the heel will be on the floor. 6/29; the patient is completing his antibiotics. Depth of this is come in somewhat. He has been to see his primary doctor and  diagnosed with prediabetes now on metformin 500 twice daily. 1 would wonder about the etiology of his neuropathy. They are making a better effort to use the heel offloading boot properly. He has significant gait ataxia I would be concerned about a total contact cast 7/6- Completed cipro, he continues to use the offloading shoe, he does have balance issues with right leg being shorter, has been changing dressing on heel every other day 7/13; he is using his offloading shoe. Some improvement in length and width and depth. However this is a deep punched out wound on the left plantar heel. His right leg is shorter and his balance is ataxic secondary to a neuropathy. He is prediabetic on metformin. It would be difficult to see him in a total contact cast although he assures me along with his wife that  they were doing as much pressure relief short of this they can 7/20; he is still in his heel offloading shoe. Absolutely no difference this week. Thick callus skin and subcutaneous tissue around the deep punched out wound. Required an extensive debridement. We have been using Prisma 7/27; he is still on his heel offloading shoe and according to the patient and his wife he is religiously offloading this. He has a little less depth to the wound certainly no change in the surface area. He continues to have thick callus skin and subcutaneous tissue around the wound edge which I continuously have to debride including today. If we can get this to a more superficial area that would be success. He is using silver collagen. He is not a candidate for a total contact cast secondary to gait and balance issues 08/23/18-Patient returns at 2 weeks for his left heel wound. This appears to be slightly bigger in dimension. Less necrotic tissue than before. Continue to use Prisma. 8/10-Patient returns at 1 week to continue care for his left heel wound, this is looking better than before, definitely less necrotic tissue, continue to use Prisma 8/17; left plantar heel. Once again rolled up areas of skin and subcutaneous tissue around the wound. Wound surface is not too bad. We have been using Prisma 8/24; left plantar heel. Absolutely no change to rolled up skin and subcutaneous tissue around the wound removed with a #5 curette we have been using silver collagen for a long period of time without any improvement although I think this is largely a pressure issue. I have changed him to [facility] today. Again we have talked about a total contact cast although this would add to his already left greater than right leg length discrepancy about an inch and a half according to the patient. 8/31; left plantar heel. Absolutely no change. Circular punched out wound with callus thick subcutaneous tissue around the wound  margin. We have been using Hydrofera Blue 9/21; left plantar heel although the surface area of this is not changed the depth seems somewhat better. There is also not as much callus and thick subcutaneous tissue. We have been using Hydrofera Blue. He came in this day with a support on the right foot which is his shorter leg. We are using a heel offloading boot on the left. 9/28 left plantar heel. Using Hydrofera Blue. We have improvement in measurements but this is still a relatively punched-out area with evidence of excessive pressure 10/5; left plantar heel using Hydrofera Blue surface area measurements are not different however there is certainly less callus less rolled edges around the wound. No debridement today. I am continuing Hydrofera  Blue for another week. There is no further option for offloading other than the heel offloading boot that he currently uses 10/12; no different from last week using Hydrofera Blue. Still requiring debridement. I believe this is an offloading issue 10/19; we switch to endoform last week. Surface area is improved and the wound certainly looks smaller from that surface area point of view but the depth is about the same. Extensive discussion today about a total contact cast. His wife is present. She expressed both of their desire to get this wound healed and they want to try the cast. We went over risks including falls, fractures, problems with people with restless legs etc. etc. the bottom line is that we agreed to try this. He will be back on Thursday for the obligatory total contact cast change 10/22; back for the obligatory total contact cast change. He had an injury and a new open area at the top of the cast over the anterior tibia. This is superficial. Apparently this happened the next day after we put it on but he would not allow his wife to call and tell us. He cut out the area of the cast. Fortunately the wound that we have been following him for looks  better. This is on the left heel more superficial healthier looking granulation. Using silver collagen 10/26; the wound on his left heel looks smaller in terms of surface area but the depth is still the same. This has come in fairly well however. The cast injury on the anterior tibia is about the same. We are using collagen on the heel silver alginate on the cast injury site on the left mid tibia area. He tells me he was up on his feet yesterday quite a bit because of car problems 11/2; left heel wound perhaps some improvement in depth and less callus however not as dramatic changes I was hoping to see. The cast injury on the mid anterior tibia looks better. Using silver alginate Objective Constitutional Patient is hypertensive.. Pulse regular and within target range for patient.Marland Kitchen Respirations regular, non-labored and within target range.. Temperature is normal and within the target range for the patient.Marland Kitchen Appears in no distress. Vitals Time Taken: 8:36 AM, Height: 76 in, Weight: 335 lbs, BMI: 40.8, Temperature: 98.2 F, Pulse: 98 bpm, Respiratory Rate: 20 breaths/min, Blood Pressure: 168/89 mmHg. Respiratory work of breathing is normal. Cardiovascular No pulses palpable on the left foot. No uncontrolled edema. Psychiatric appears at normal baseline. General Notes: Wound exam; left plantar heel no erythema. Debrided of callus skin and subcutaneous tissue and some debris over the surface of the wound the wound surface is not as viable as I was hoping to see. ooCast injury on the left anterior mid tibia is smaller looks healthy Integumentary (Hair, Skin) No evidence of infection in either wound area. Wound #2 status is Open. Original cause of wound was Gradually Appeared. The wound is located on the Left Calcaneus. The wound measures 0.5cm length x 0.7cm width x 0.4cm depth; 0.275cm^2 area and 0.11cm^3 volume. There is Fat Layer (Subcutaneous Tissue) Exposed exposed. There is no tunneling  or undermining noted. There is a medium amount of serosanguineous drainage noted. The wound margin is thickened. There is medium (34-66%) pink granulation within the wound bed. There is a medium (34-66%) amount of necrotic tissue within the wound bed including Adherent Slough. Wound #3 status is Open. Original cause of wound was Shear/Friction. The wound is located on the Left,Anterior Lower Leg. The wound measures 1cm  length x 0.6cm width x 0.1cm depth; 0.471cm^2 area and 0.047cm^3 volume. There is Fat Layer (Subcutaneous Tissue) Exposed exposed. There is no tunneling or undermining noted. There is a small amount of serosanguineous drainage noted. The wound margin is flat and intact. There is large (67-100%) red granulation within the wound bed. There is no necrotic tissue within the wound bed. Assessment Active Problems ICD-10 Non-pressure chronic ulcer of left heel and midfoot with fat layer exposed Other idiopathic peripheral autonomic neuropathy Prediabetes Contusion of left lower leg, subsequent encounter Procedures Wound #2 Pre-procedure diagnosis of Wound #2 is a Neuropathic Ulcer-Non Diabetic located on the Left Calcaneus . There was a Excisional Skin/Subcutaneous Tissue Debridement with a total area of 0.35 sq cm performed by Ricard Dillon., MD. With the following instrument(s): Curette to remove Viable and Non-Viable tissue/material. Material removed includes Callus and Subcutaneous Tissue and after achieving pain control using Other (bonzocaine, 20%). No specimens were taken. A time out was conducted at 09:04, prior to the start of the procedure. A Minimum amount of bleeding was controlled with N/A. The procedure was tolerated well with a pain level of 0 throughout and a pain level of 0 following the procedure. Post Debridement Measurements: 0.7cm length x 0.7cm width x 0.4cm depth; 0.154cm^3 volume. Character of Wound/Ulcer Post Debridement is improved. Post procedure  Diagnosis Wound #2: Same as Pre-Procedure Pre-procedure diagnosis of Wound #2 is a Neuropathic Ulcer-Non Diabetic located on the Left Calcaneus . There was a Total Contact Cast Procedure by Ricard Dillon., MD. Post procedure Diagnosis Wound #2: Same as Pre-Procedure Plan Follow-up Appointments: Return Appointment in 1 week. Dressing Change Frequency: Wound #2 Left Calcaneus: Do not change entire dressing for one week. Skin Barriers/Peri-Wound Care: Wound #2 Left Calcaneus: Moisturizing lotion Wound Cleansing: Wound #2 Left Calcaneus: May shower with protection. Primary Wound Dressing: Wound #2 Left Calcaneus: Endoform - moisten with normal saline Wound #3 Left,Anterior Lower Leg: Calcium Alginate Secondary Dressing: Wound #2 Left Calcaneus: Foam Kerlix/Rolled Gauze Dry Gauze Drawtex Wound #3 Left,Anterior Lower Leg: Foam - pad up anterior lower leg. Off-Loading: Total Contact Cast to Left Lower Extremity - Patient to please call if the cast starts rubbing or becomes loose. Other: - Gloped heel offloader sandal to left foot - extra felt for padding Limit your walking and standing on your foot. 1. Continue with endoform as the primary dressing to heal and calcium alginate to the left anterior mid tibia 2. Reapplication of a total contact cast Electronic Signature(s) Signed: 11/22/2018 5:57:33 PM By: Linton Ham MD Entered By: Linton Ham on 11/22/2018 09:17:44 -------------------------------------------------------------------------------- Total Contact Cast Details Patient Name: Date of Service: John Griffin, John Griffin 11/22/2018 8:30 AM Medical Record Number:[id number] Patient Account Number: [id number] Date of Birth/Sex: 02-24-1950 (68 y.o. M) Treating RN: Levan Hurst Primary Care Provider: Christain Sacramento Other Clinician: Referring Provider: Treating Provider/Extender:Hervey Wedig, [place] Chard, Letta Moynahan in Treatment: 20 Total Contact Cast Applied for Wound  Assessment: Wound #2 Left Calcaneus Performed By: Physician Ricard Dillon., MD Post Procedure Diagnosis Same as Pre-procedure Electronic Signature(s) Signed: 11/22/2018 5:57:33 PM By: Linton Ham MD Entered By: Linton Ham on 11/22/2018 09:15:41 -------------------------------------------------------------------------------- SuperBill Details Patient Name: Date of Service: John Griffin, John Griffin 11/22/2018 Medical Record Number:[id number] Patient Account Number: [id number] Date of Birth/Sex: Treating RN: 10-Sep-1950 (68 y.o. [name] Primary Care Provider: Christain Sacramento Other Clinician: Referring Provider: Treating Provider/Extender:Darek Eifler, [place] Chard, Jama Flavors Weeks in Treatment: 20 Diagnosis Coding ICD-10 Codes Code Description [phone] Non-pressure chronic ulcer of left heel  and midfoot with fat layer exposed G90.09 Other idiopathic peripheral autonomic neuropathy R73.03 Prediabetes S80.12XD Contusion of left lower leg, subsequent encounter Facility Procedures The patient participates with Medicare or their insurance follows the Medicare Facility Guidelines: CPT4 Code Description Modifier Quantity [id number] - DEB SUBQ TISSUE 20 SQ CM/< 1 ICD-10 Diagnosis Description L97.422 Non-pressure chronic ulcer of  left heel and midfoot with fat layer exposed G90.09 Other idiopathic peripheral autonomic neuropathy S80.12XD Contusion of left lower leg, subsequent encounter Physician Procedures CPT4 Code Description: [id number] - WC PHYS SUBQ TISS 20 SQ CM ICD-10 Diagnosis Description L97.422 Non-pressure chronic ulcer of left heel and midfoot wi G90.09 Other idiopathic peripheral autonomic neuropathy S80.12XD Contusion of left lower leg,  subsequent encounter Modifier: th fat layer ex Quantity: 1 posed Electronic Signature(s) Signed: 11/22/2018 5:57:33 PM By: Linton Ham MD Entered By: Linton Ham on 11/22/2018 09:18:01

## 2018-11-29 ENCOUNTER — Other Ambulatory Visit: Payer: Self-pay

## 2018-11-29 ENCOUNTER — Encounter (HOSPITAL_BASED_OUTPATIENT_CLINIC_OR_DEPARTMENT_OTHER): Payer: Medicare Other | Admitting: Internal Medicine

## 2018-11-29 DIAGNOSIS — J449 Chronic obstructive pulmonary disease, unspecified: Secondary | ICD-10-CM | POA: Diagnosis not present

## 2018-11-29 DIAGNOSIS — G9009 Other idiopathic peripheral autonomic neuropathy: Secondary | ICD-10-CM | POA: Diagnosis not present

## 2018-11-29 DIAGNOSIS — R260 Ataxic gait: Secondary | ICD-10-CM | POA: Diagnosis not present

## 2018-11-29 DIAGNOSIS — R7303 Prediabetes: Secondary | ICD-10-CM | POA: Diagnosis not present

## 2018-11-29 DIAGNOSIS — I2510 Atherosclerotic heart disease of native coronary artery without angina pectoris: Secondary | ICD-10-CM | POA: Diagnosis not present

## 2018-11-29 DIAGNOSIS — L97422 Non-pressure chronic ulcer of left heel and midfoot with fat layer exposed: Secondary | ICD-10-CM | POA: Diagnosis not present

## 2018-11-29 NOTE — Progress Notes (Signed)
John, Griffin ([id number]) Visit Report for 11/29/2018 HPI Details Patient Name: Date of Service: John Griffin, John Griffin 11/29/2018 8:00 AM Medical Record Number:[id number] Patient Account Number: [id number] Date of Birth/Sex: Treating RN: [birth year] (68 y.o. John Griffin Primary Care Provider: Christain Sacramento Other Clinician: Referring Provider: Treating Provider/Extender:John Griffin, Glendale Chard, Letta Moynahan in Treatment: 21 History of Present Illness HPI Description: ADMISSION 11/19/2017 This is a 68 year old man who is not a diabetic. He tells me that 6 months ago he noticed a callused area on his left heel that open into a wound. This is on the plantar aspect of his heel just near the tip of his calcaneus. He says that this is open and closed repetitively over the last 6 months. He recently saw his primary doctor who sent him here. He is been using Neosporin and a Band-Aid. He tells me that he follows with cardiology and he had a hemoglobin A1c but he does not know what it is but he has been told he is not a diabetic. Measurement on the wound was 0.2 x 1.5 with some undermining distally in this oval-shaped horizontally orientated wound. He does not feel any pain He tells me that for an indefinite period of time he has not had much feeling in his feet. He has not had a medical work-up for this. Our intake nurses noted reduced microfilament sensation The patient has a history of COPD, hypercholesterolemia, coronary artery disease, gastroesophageal reflux disease, prostate cancer treated with surgery, obstructive sleep apnea. He tells [place] that he twisted his back last week and he has a back brace he is also using a cane ABI in our clinic was 1.17 on the left 11/26/2017; the patient arrives with his wound looking a lot better. He has been offloading this using a heel offloading boot. Dimensions are a lot better. 12/03/2017; the area on the tip of his heel extending slightly into the plantar  surface has no open area. Using a 5 curette and then a #10 scalpel I removed some callus but there is no open wound. I think this is going to be an offloading issue in a patient who has nondiabetic neuropathy. For now we are going to recommend a heel cup type heel protector and see if this will maintain skin integrity in this area. READMISSION 07/05/2018 Patient returns to clinic with a reopening of the left heel about a month and a half ago. He was in this clinic in the fall with a wound and roughly the same area. He has some form of idiopathic neuropathy not listed as a diabetic. Very ataxic wound. He also has leg length discrepancy secondary to a previous accident he had and according to his wife a lot of the pressure when he takes a step lands on the left heel. They have been using Neosporin more recently some leftover silver collagen. They did go into a heel offloading boot that we gave him last time. Past medical history as noted he is not a diabetic, he has idiopathic peripheral neuropathy, hypertension, coronary artery disease, obstructive sleep apnea on CPAP, prostate CA COPD. He is a heavy smoker ABI in our clinic was 1.05 on the left 6/22; x-ray I did last time showed no evidence of osteomyelitis. A wound culture showed Alcaligenes faecalis. I am not well familiar with this organism although I will try to look it up in more detail. In any case the wound is somewhat worse today with a small probing area off the base of the wound.  Also problematic is that he does not seem to wear the heel offloading boot properly. We have emphasized that his foot has to be all the way forward, otherwise the heel will be on the floor. 6/29; the patient is completing his antibiotics. Depth of this is come in somewhat. He has been to see his primary doctor and diagnosed with prediabetes now on metformin 500 twice daily. 1 would wonder about the etiology of his neuropathy. They are making a better effort to  use the heel offloading boot properly. He has significant gait ataxia I would be concerned about a total contact cast 7/6- Completed cipro, he continues to use the offloading shoe, he does have balance issues with right leg being shorter, has been changing dressing on heel every other day 7/13; he is using his offloading shoe. Some improvement in length and width and depth. However this is a deep punched out wound on the left plantar heel. His right leg is shorter and his balance is ataxic secondary to a neuropathy. He is prediabetic on metformin. It would be difficult to see him in a total contact cast although he assures me along with his wife that they were doing as much pressure relief short of this they can 7/20; he is still in his heel offloading shoe. Absolutely no difference this week. Thick callus skin and subcutaneous tissue around the deep punched out wound. Required an extensive debridement. We have been using Prisma 7/27; he is still on his heel offloading shoe and according to the patient and his wife he is religiously offloading this. He has a little less depth to the wound certainly no change in the surface area. He continues to have thick callus skin and subcutaneous tissue around the wound edge which I continuously have to debride including today. If we can get this to a more superficial area that would be success. He is using silver collagen. He is not a candidate for a total contact cast secondary to gait and balance issues 08/23/18-Patient returns at 2 weeks for his left heel wound. This appears to be slightly bigger in dimension. Less necrotic tissue than before. Continue to use Prisma. 8/10-Patient returns at 1 week to continue care for his left heel wound, this is looking better than before, definitely less necrotic tissue, continue to use Prisma 8/17; left plantar heel. Once again rolled up areas of skin and subcutaneous tissue around the wound. Wound surface is not too  bad. We have been using Prisma 8/24; left plantar heel. Absolutely no change to rolled up skin and subcutaneous tissue around the wound removed with a #5 curette we have been using silver collagen for a long period of time without any improvement although I think this is largely a pressure issue. I have changed him to [facility] today. Again we have talked about a total contact cast although this would add to his already left greater than right leg length discrepancy about an inch and a half according to the patient. 8/31; left plantar heel. Absolutely no change. Circular punched out wound with callus thick subcutaneous tissue around the wound margin. We have been using Hydrofera Blue 9/21; left plantar heel although the surface area of this is not changed the depth seems somewhat better. There is also not as much callus and thick subcutaneous tissue. We have been using Hydrofera Blue. He came in this day with a support on the right foot which is his shorter leg. We are using a heel offloading boot on  the left. 9/28 left plantar heel. Using Hydrofera Blue. We have improvement in measurements but this is still a relatively punched-out area with evidence of excessive pressure 10/5; left plantar heel using Hydrofera Blue surface area measurements are not different however there is certainly less callus less rolled edges around the wound. No debridement today. I am continuing Hydrofera Blue for another week. There is no further option for offloading other than the heel offloading boot that he currently uses 10/12; no different from last week using Hydrofera Blue. Still requiring debridement. I believe this is an offloading issue 10/19; we switch to endoform last week. Surface area is improved and the wound certainly looks smaller from that surface area point of view but the depth is about the same. Extensive discussion today about a total contact cast. His wife is present. She expressed both of  their desire to get this wound healed and they want to try the cast. We went over risks including falls, fractures, problems with people with restless legs etc. etc. the bottom line is that we agreed to try this. He will be back on Thursday for the obligatory total contact cast change 10/22; back for the obligatory total contact cast change. He had an injury and a new open area at the top of the cast over the anterior tibia. This is superficial. Apparently this happened the next day after we put it on but he would not allow his wife to call and tell us. He cut out the area of the cast. Fortunately the wound that we have been following him for looks better. This is on the left heel more superficial healthier looking granulation. Using silver collagen 10/26; the wound on his left heel looks smaller in terms of surface area but the depth is still the same. This has come in fairly well however. The cast injury on the anterior tibia is about the same. We are using collagen on the heel silver alginate on the cast injury site on the left mid tibia area. He tells me he was up on his feet yesterday quite a bit because of car problems 11/2; left heel wound perhaps some improvement in depth and less callus however not as dramatic changes I was hoping to see. The cast injury on the mid anterior tibia looks better. Using silver alginate 11/9 left heel wound is improved. Dimensions are smaller. There is still some depth with some undermining. Less callus and thick subcutaneous tissue around the wound than I am used to seeing. This reflects improvement with offloading in a total contact cast Electronic Signature(s) Signed: 11/29/2018 5:25:49 PM By: Linton Ham MD Entered By: Linton Ham on 11/29/2018 09:29:13 -------------------------------------------------------------------------------- Physical Exam Details Patient Name: Date of Service: John Griffin, John Griffin 11/29/2018 8:00 AM Medical Record  Number:[id number] Patient Account Number: [id number] Date of Birth/Sex: Treating RN: 03-08-1950 (68 y.o. John Griffin Primary Care Provider: Christain [place] Other Clinician: Referring Provider: Treating Provider/Extender:Winthrop Shannahan, [place]endale Chard, Letta Moynahan in Treatment: 21 Constitutional Patient is hypertensive.. Pulse regular and within target range for patient.Marland Kitchen Respirations regular, non-labored and within target range.. Temperature is normal and within the target range for the patient.Marland Kitchen Appears in no distress. Eyes Conjunctivae clear. No discharge.no icterus. Cardiovascular Pedal pulses palpable and strong bilaterally.. Musculoskeletal Wide-based gait as usual. Integumentary (Hair, Skin) No erythema around the wound. Psychiatric appears at normal baseline. Notes Wound exam; left plantar heel. There is no erythema. Callus and subcutaneous tissue much less that I am used to seeing. The wound still  has some depth and a slight amount of undermining. No debridement was done today. No erythema around the wound Electronic Signature(s) Signed: 11/29/2018 5:25:49 PM By: Linton Ham MD Entered By: Linton Ham on 11/29/2018 09:30:29 -------------------------------------------------------------------------------- Physician Orders Details Patient Name: Date of Service: WILLLIAM, DELFAVERO 11/29/2018 8:00 AM Medical Record Number:[id number] Patient Account Number: [id number] Date of Birth/Sex: Treating RN: Apr 15, 1950 (68 y.o. John Griffin Primary Care Provider: Christain [place] Other Clinician: Referring Provider: Treating Provider/Extender:Meshia Rau, [place] Chard, Letta Moynahan in Treatment: 21 Verbal / Phone Orders: No Diagnosis Coding ICD-10 Coding Code Description [id number] Non-pressure chronic ulcer of left heel and midfoot with fat layer exposed G90.09 Other idiopathic peripheral autonomic neuropathy R73.03 Prediabetes S80.12XD Contusion of left lower leg, subsequent  encounter Follow-up Appointments Return Appointment in 1 week. Dressing Change Frequency Wound #2 Left Calcaneus Do not change entire dressing for one week. Skin Barriers/Peri-Wound Care Wound #2 Left Calcaneus Moisturizing lotion Wound Cleansing Wound #2 Left Calcaneus May shower with protection. Primary Wound Dressing Wound #2 Left Calcaneus Endoform - moisten with normal saline Secondary Dressing Wound #2 Left Calcaneus Foam - also apply foam to shin for protection Dry Gauze Drawtex Off-Loading Total Contact Cast to Left Lower Extremity - Patient to please call if the cast starts rubbing or becomes loose. Other: - Gloped heel offloader sandal to left foot - extra felt for padding Limit your walking and standing on your foot. Electronic Signature(s) Signed: 11/29/2018 5:25:49 PM By: Linton Ham MD Signed: 11/29/2018 5:46:53 PM By: Levan Hurst RN, BSN Entered By: Levan Hurst on 11/29/2018 08:53:15 -------------------------------------------------------------------------------- Problem List Details Patient Name: Date of Service: CLIMMIE, SLOVICK 11/29/2018 8:00 AM Medical Record Number:[id number] Patient Account Number: [id number] Date of Birth/Sex: Treating RN: Aug 25, 1950 (68 y.o. John Griffin Primary Care Provider: Christain [place] Other Clinician: Referring Provider: Treating Provider/Extender:George Haggart, Glendale Chard, Jama Flavors Weeks in Treatment: 21 Active Problems ICD-10 Evaluated Encounter Code Description Active Date Today Diagnosis L97.422 Non-pressure chronic ulcer of left heel and midfoot 07/05/2018 No Yes with fat layer exposed G90.09 Other idiopathic peripheral autonomic neuropathy 07/05/2018 No Yes R73.03 Prediabetes 07/19/2018 No Yes Inactive Problems ICD-10 Code Description Active Date Inactive Date S80.12XD Contusion of left lower leg, subsequent encounter 11/11/2018 11/11/2018 Resolved Problems Electronic Signature(s) Signed: 11/29/2018 5:25:49  PM By: Linton Ham MD Entered By: Linton Ham on 11/29/2018 09:27:03 -------------------------------------------------------------------------------- Progress Note Details Patient Name: Date of Service: John Griffin, John Griffin 11/29/2018 8:00 AM Medical Record Number:[id number] Patient Account Number: [id number] Date of Birth/Sex: Treating RN: Sep 03, 1950 (68 y.o. John Griffin Primary Care Provider: Christain [place] Other Clinician: Referring Provider: Treating Provider/Extender:Cuthbert Turton, Glendale Chard, Letta Moynahan in Treatment: 21 Subjective History of Present Illness (HPI) ADMISSION 11/19/2017 This is a 68 year old man who is not a diabetic. He tells me that 6 months ago he noticed a callused area on his left heel that open into a wound. This is on the plantar aspect of his heel just near the tip of his calcaneus. He says that this is open and closed repetitively over the last 6 months. He recently saw his primary doctor who sent him here. He is been using Neosporin and a Band-Aid. He tells me that he follows with cardiology and he had a hemoglobin A1c but he does not know what it is but he has been told he is not a diabetic. Measurement on the wound was 0.2 x 1.5 with some undermining distally in this oval-shaped horizontally orientated wound. He does not feel any pain He tells me  that for an indefinite period of time he has not had much feeling in his feet. He has not had a medical work-up for this. Our intake nurses noted reduced microfilament sensation The patient has a history of COPD, hypercholesterolemia, coronary artery disease, gastroesophageal reflux disease, prostate cancer treated with surgery, obstructive sleep apnea. He tells [place] that he twisted his back last week and he has a back brace he is also using a cane ABI in our clinic was 1.17 on the left 11/26/2017; the patient arrives with his wound looking a lot better. He has been offloading this using a heel  offloading boot. Dimensions are a lot better. 12/03/2017; the area on the tip of his heel extending slightly into the plantar surface has no open area. Using a 5 curette and then a #10 scalpel I removed some callus but there is no open wound. I think this is going to be an offloading issue in a patient who has nondiabetic neuropathy. For now we are going to recommend a heel cup type heel protector and see if this will maintain skin integrity in this area. READMISSION 07/05/2018 Patient returns to clinic with a reopening of the left heel about a month and a half ago. He was in this clinic in the fall with a wound and roughly the same area. He has some form of idiopathic neuropathy not listed as a diabetic. Very ataxic wound. He also has leg length discrepancy secondary to a previous accident he had and according to his wife a lot of the pressure when he takes a step lands on the left heel. They have been using Neosporin more recently some leftover silver collagen. They did go into a heel offloading boot that we gave him last time. Past medical history as noted he is not a diabetic, he has idiopathic peripheral neuropathy, hypertension, coronary artery disease, obstructive sleep apnea on CPAP, prostate CA COPD. He is a heavy smoker ABI in our clinic was 1.05 on the left 6/22; x-ray I did last time showed no evidence of osteomyelitis. A wound culture showed Alcaligenes faecalis. I am not well familiar with this organism although I will try to look it up in more detail. In any case the wound is somewhat worse today with a small probing area off the base of the wound. Also problematic is that he does not seem to wear the heel offloading boot properly. We have emphasized that his foot has to be all the way forward, otherwise the heel will be on the floor. 6/29; the patient is completing his antibiotics. Depth of this is come in somewhat. He has been to see his primary doctor and diagnosed with  prediabetes now on metformin 500 twice daily. 1 would wonder about the etiology of his neuropathy. They are making a better effort to use the heel offloading boot properly. He has significant gait ataxia I would be concerned about a total contact cast 7/6- Completed cipro, he continues to use the offloading shoe, he does have balance issues with right leg being shorter, has been changing dressing on heel every other day 7/13; he is using his offloading shoe. Some improvement in length and width and depth. However this is a deep punched out wound on the left plantar heel. His right leg is shorter and his balance is ataxic secondary to a neuropathy. He is prediabetic on metformin. It would be difficult to see him in a total contact cast although he assures me along with his wife that they  were doing as much pressure relief short of this they can 7/20; he is still in his heel offloading shoe. Absolutely no difference this week. Thick callus skin and subcutaneous tissue around the deep punched out wound. Required an extensive debridement. We have been using Prisma 7/27; he is still on his heel offloading shoe and according to the patient and his wife he is religiously offloading this. He has a little less depth to the wound certainly no change in the surface area. He continues to have thick callus skin and subcutaneous tissue around the wound edge which I continuously have to debride including today. If we can get this to a more superficial area that would be success. He is using silver collagen. He is not a candidate for a total contact cast secondary to gait and balance issues 08/23/18-Patient returns at 2 weeks for his left heel wound. This appears to be slightly bigger in dimension. Less necrotic tissue than before. Continue to use Prisma. 8/10-Patient returns at 1 week to continue care for his left heel wound, this is looking better than before, definitely less necrotic tissue, continue to use  Prisma 8/17; left plantar heel. Once again rolled up areas of skin and subcutaneous tissue around the wound. Wound surface is not too bad. We have been using Prisma 8/24; left plantar heel. Absolutely no change to rolled up skin and subcutaneous tissue around the wound removed with a #5 curette we have been using silver collagen for a long period of time without any improvement although I think this is largely a pressure issue. I have changed him to [facility] today. Again we have talked about a total contact cast although this would add to his already left greater than right leg length discrepancy about an inch and a half according to the patient. 8/31; left plantar heel. Absolutely no change. Circular punched out wound with callus thick subcutaneous tissue around the wound margin. We have been using Hydrofera Blue 9/21; left plantar heel although the surface area of this is not changed the depth seems somewhat better. There is also not as much callus and thick subcutaneous tissue. We have been using Hydrofera Blue. He came in this day with a support on the right foot which is his shorter leg. We are using a heel offloading boot on the left. 9/28 left plantar heel. Using Hydrofera Blue. We have improvement in measurements but this is still a relatively punched-out area with evidence of excessive pressure 10/5; left plantar heel using Hydrofera Blue surface area measurements are not different however there is certainly less callus less rolled edges around the wound. No debridement today. I am continuing Hydrofera Blue for another week. There is no further option for offloading other than the heel offloading boot that he currently uses 10/12; no different from last week using Hydrofera Blue. Still requiring debridement. I believe this is an offloading issue 10/19; we switch to endoform last week. Surface area is improved and the wound certainly looks smaller from that surface area point of  view but the depth is about the same. Extensive discussion today about a total contact cast. His wife is present. She expressed both of their desire to get this wound healed and they want to try the cast. We went over risks including falls, fractures, problems with people with restless legs etc. etc. the bottom line is that we agreed to try this. He will be back on Thursday for the obligatory total contact cast change 10/22; back for the  obligatory total contact cast change. He had an injury and a new open area at the top of the cast over the anterior tibia. This is superficial. Apparently this happened the next day after we put it on but he would not allow his wife to call and tell us. He cut out the area of the cast. Fortunately the wound that we have been following him for looks better. This is on the left heel more superficial healthier looking granulation. Using silver collagen 10/26; the wound on his left heel looks smaller in terms of surface area but the depth is still the same. This has come in fairly well however. The cast injury on the anterior tibia is about the same. We are using collagen on the heel silver alginate on the cast injury site on the left mid tibia area. He tells me he was up on his feet yesterday quite a bit because of car problems 11/2; left heel wound perhaps some improvement in depth and less callus however not as dramatic changes I was hoping to see. The cast injury on the mid anterior tibia looks better. Using silver alginate 11/9 left heel wound is improved. Dimensions are smaller. There is still some depth with some undermining. Less callus and thick subcutaneous tissue around the wound than I am used to seeing. This reflects improvement with offloading in a total contact cast Objective Constitutional Patient is hypertensive.. Pulse regular and within target range for patient.Marland Kitchen Respirations regular, non-labored and within target range.. Temperature is normal and  within the target range for the patient.Marland Kitchen Appears in no distress. Vitals Time Taken: 7:50 AM, Height: 76 in, Weight: 335 lbs, BMI: 40.8, Temperature: 98.5 F, Pulse: 77 bpm, Respiratory Rate: 20 breaths/min, Blood Pressure: 159/83 mmHg. Eyes Conjunctivae clear. No discharge.no icterus. Cardiovascular Pedal pulses palpable and strong bilaterally.. Musculoskeletal Wide-based gait as usual. Psychiatric appears at normal baseline. General Notes: Wound exam; left plantar heel. There is no erythema. Callus and subcutaneous tissue much less that I am used to seeing. The wound still has some depth and a slight amount of undermining. No debridement was done today. No erythema around the wound Integumentary (Hair, Skin) No erythema around the wound. Wound #2 status is Open. Original cause of wound was Gradually Appeared. The wound is located on the Left Calcaneus. The wound measures 0.4cm length x 0.4cm width x 0.3cm depth; 0.126cm^2 area and 0.038cm^3 volume. There is Fat Layer (Subcutaneous Tissue) Exposed exposed. There is no tunneling or undermining noted. There is a medium amount of serosanguineous drainage noted. The wound margin is thickened. There is large (67- 100%) pink granulation within the wound bed. There is no necrotic tissue within the wound bed. General Notes: callus to periwound. Wound #3 status is Healed - Epithelialized. Original cause of wound was Shear/Friction. The wound is located on the Left,Anterior Lower Leg. The wound measures 0cm length x 0cm width x 0cm depth; 0cm^2 area and 0cm^3 volume. Assessment Active Problems ICD-10 Non-pressure chronic ulcer of left heel and midfoot with fat layer exposed Other idiopathic peripheral autonomic neuropathy Prediabetes Procedures Wound #2 Pre-procedure diagnosis of Wound #2 is a Neuropathic Ulcer-Non Diabetic located on the Left Calcaneus . There was a Total Contact Cast Procedure by Ricard Dillon., MD. Post procedure  Diagnosis Wound #2: Same as Pre-Procedure Plan Follow-up Appointments: Return Appointment in 1 week. Dressing Change Frequency: Wound #2 Left Calcaneus: Do not change entire dressing for one week. Skin Barriers/Peri-Wound Care: Wound #2 Left Calcaneus: Moisturizing lotion Wound Cleansing:  Wound #2 Left Calcaneus: May shower with protection. Primary Wound Dressing: Wound #2 Left Calcaneus: Endoform - moisten with normal saline Secondary Dressing: Wound #2 Left Calcaneus: Foam - also apply foam to shin for protection Dry Gauze Drawtex Off-Loading: Total Contact Cast to Left Lower Extremity - Patient to please call if the cast starts rubbing or becomes loose. Other: - Gloped heel offloader sandal to left foot - extra felt for padding Limit your walking and standing on your foot. 1. The area on the left anterior tibia which was a cast injury has closed over 2. Continue endoform under a total contact cast 3. I had some preliminary discussion with the patient now about how we are going to offload this area with the eventuality that this will close perhaps in the next 2 to 3 weeks. He has been dealing with this for about a year and a half. He is not a diabetic. I do not know that he has the resources for custom foot wear with insoles. We talked about getting a running shoe with some depth. Putting to Dr. Felicie Morn insoles and cutting out a large part of the heel. Electronic Signature(s) Signed: 11/29/2018 5:25:49 PM By: Linton Ham MD Entered By: Linton Ham on 11/29/2018 09:32:03 -------------------------------------------------------------------------------- Total Contact Cast Details Patient Name: Date of Service: John Griffin, John Griffin 11/29/2018 8:00 AM Medical Record Number:[id number] Patient Account Number: [id number] Date of Birth/Sex: 1950-09-10 (68 y.o. M) Treating RN: Levan Hurst Primary Care Provider: Christain [place] Other Clinician: Referring Provider: Treating  Provider/Extender:Trula Frede, Glendale Chard, Letta Moynahan in Treatment: 21 Total Contact Cast Applied for Wound Assessment: Wound #2 Left Calcaneus Performed By: Physician Ricard Dillon., MD Post Procedure Diagnosis Same as Pre-procedure Electronic Signature(s) Signed: 11/29/2018 5:25:49 PM By: Linton Ham MD Entered By: Linton Ham on 11/29/2018 [id number] -------------------------------------------------------------------------------- SuperBill Details Patient Name: Date of Service: John Griffin, John Griffin 11/29/2018 Medical Record Number:[id number] Patient Account Number: [id number] Date of Birth/Sex: Treating RN: 09/22/1950 (68 y.o. John Griffin Primary Care Provider: Christain [place] Other Clinician: Referring Provider: Treating Provider/Extender:Evertte Sones, Glendale Chard, Letta Moynahan in Treatment: 21 Diagnosis Coding ICD-10 Codes Code Description [phone] Non-pressure chronic ulcer of left heel and midfoot with fat layer exposed G90.09 Other idiopathic peripheral autonomic neuropathy R73.03 Prediabetes Facility Procedures The patient participates with Medicare or their insurance follows the Medicare Facility Guidelines: CPT4 Code Description Modifier Quantity [id number] - APPLY TOTAL CONTACT LEG CAST 1 ICD-10 Diagnosis Description L97.422 Non-pressure chronic ulcer of  left heel and midfoot with fat layer exposed Physician Procedures CPT4 Code Description: [id number] - WC PHYS APPLY TOTAL CONTACT CAST ICD-10 Diagnosis Description L97.422 Non-pressure chronic ulcer of left heel and midfoot with fa Modifier: t layer expo Quantity: 1 sed Electronic Signature(s) Signed: 11/29/2018 5:25:49 PM By: Linton Ham MD Entered By: Linton Ham on 11/29/2018 09:32:33

## 2018-11-29 NOTE — Progress Notes (Addendum)
John Griffin ([id number]) Visit Report for 11/29/2018 Arrival Information Details Patient Name: Date of Service: John Griffin, John Griffin 11/29/2018 8:00 AM Medical Record Number:[id number] Patient Account Number: [id number] Date of Birth/Sex: Treating RN: Aug 18, 1950 (68 y.o. [name], Meta.Reding Primary Care Albeiro Trompeter: Christain [place] Other Clinician: Referring Dasha Kawabata: Treating Phillipa Morden/Extender:Robson, Glendale Chard, Letta Moynahan in Treatment: 21 Visit Information History Since Last Visit Added or deleted any medications: No Patient Arrived: Ambulatory Any new allergies or adverse reactions: No Arrival Time: 07:49 Had a fall or experienced change in No Accompanied By: self activities of daily living that may affect Transfer Assistance: None risk of falls: Patient Identification Verified: Yes Signs or symptoms of abuse/neglect since No Secondary Verification Process Yes last visito Completed: Hospitalized since last visit: No Patient Requires Transmission-Based No Implantable device outside of the clinic No Precautions: excluding Patient Has Alerts: No cellular tissue based products placed in the center since last visit: Has Footwear/Offloading in Place as Yes Prescribed: Left: Total Contact Cast Pain Present Now: No Electronic Signature(s) Signed: 11/29/2018 3:33:55 PM By: Deon Pilling Entered By: Deon Pilling on 11/29/2018 07:50:12 -------------------------------------------------------------------------------- Encounter Discharge Information Details Patient Name: Date of Service: John Griffin 11/29/2018 8:00 AM Medical Record [id number] Patient Account Number: [id number] Date of Birth/Sex: Treating RN: 1950-01-28 (68 y.o. Marvis Repress Primary Care Jadyn Brasher: Christain [place] Other Clinician: Referring Alek Poncedeleon: Treating Stefani Baik/Extender:Robson, Glendale Chard, Letta Moynahan in Treatment: 21 Encounter Discharge Information Items Discharge Condition:  Stable Ambulatory Status: Ambulatory Discharge Destination: Home Transportation: Private Auto Accompanied By: self Schedule Follow-up Appointment: Yes Clinical Summary of Care: Patient Declined Electronic Signature(s) Signed: 11/29/2018 5:35:58 PM By: Kela Millin Entered By: Kela Millin on 11/29/2018 09:39:40 -------------------------------------------------------------------------------- Lower Extremity Assessment Details Patient Name: Date of Service: John Griffin 11/29/2018 8:00 AM Medical Record Number:[id number] Patient Account Number: [id number] Date of Birth/Sex: Treating RN: 1950/08/22 (68 y.o. [name] Primary Care Leyan Branden: Christain [place] Other Clinician: Referring Travell Desaulniers: Treating Haakon Titsworth/Extender:Robson, Glendale Chard, Jama Flavors Weeks in Treatment: 21 Edema Assessment Assessed: [Left: Yes] [Right: No] Edema: [Left: Yes] [Right: No] Calf Left: Right: Point of Measurement: 44 cm From Medial Instep 38.5 cm cm Ankle Left: Right: Point of Measurement: 11 cm From Medial Instep 23.5 cm cm Vascular Assessment Pulses: Dorsalis Pedis Palpable: [Left:Yes] Electronic Signature(s) Signed: 11/29/2018 3:33:55 PM By: Deon Pilling Entered By: Deon Pilling on 11/29/2018 07:57:20 -------------------------------------------------------------------------------- Multi Wound Chart Details Patient Name: Date of Service: John Griffin 11/29/2018 8:00 AM Medical Record Number:[id number] Patient Account Number: [id number] Date of Birth/Sex: Treating RN: [birth year] (68 y.o. [name] Primary Care Arby Dahir: Christain [place] Other Clinician: Referring Joshlyn Beadle: Treating Guida Asman/Extender:Robson, Glendale Chard, Letta Moynahan in Treatment: 21 Vital Signs Height(in): 75 Pulse(bpm): 5 Weight(lbs): 335 Blood Pressure(mmHg): 159/83 Body Mass Index(BMI): 41 Temperature(F): 98.5 Respiratory 20 Rate(breaths/min): Photos: [2:No Photos] [3:No Photos]  [N/A:N/A] Wound Location: [2:Left Calcaneus] [3:Left, Anterior Lower Leg N/A] Wounding Event: [2:Gradually Appeared] [3:Shear/Friction] [N/A:N/A] Primary Etiology: [2:Neuropathic Ulcer-Non Diabetic] [3:Skin Tear] [N/A:N/A] Comorbid History: [2:Chronic Obstructive Pulmonary Disease (COPD), Sleep Apnea, Arrhythmia, Coronary Artery Disease, Hypertension, Myocardial Infarction, Osteoarthritis] [3:N/A] [N/A:N/A] Date Acquired: [2:01/20/2017] [3:11/11/2018] [N/A:N/A] Weeks of Treatment: [2:21] [3:2] [N/A:N/A] Wound Status: [2:Open] [3:Healed - Epithelialized] [N/A:N/A] Measurements L x W x D 0.4x0.4x0.3 [3:0x0x0] [N/A:N/A] (cm) Area (cm) : [2:0.126] [3:0] [N/A:N/A] Volume (cm) : [2:0.038] [3:0] [N/A:N/A] % Reduction in Area: [2:92.00%] [3:100.00%] [N/A:N/A] % Reduction in Volume: 98.40% [3:100.00%] [N/A:N/A] Classification: [2:Full Thickness Without Exposed Support Structures Exposed Support Structures] [3:Full Thickness Without] [N/A:N/A] Exudate Amount: [2:Medium] [3:N/A] [N/A:N/A] Exudate  Type: [2:Serosanguineous] [3:N/A] [N/A:N/A] Exudate Color: [2:red, brown] [3:N/A] [N/A:N/A] Wound Margin: [2:Thickened] [3:N/A] [N/A:N/A] Granulation Amount: [2:Large (67-100%)] [3:N/A] [N/A:N/A] Granulation Quality: [2:Pink] [3:N/A] [N/A:N/A] Necrotic Amount: [2:None Present (0%)] [3:N/A] [N/A:N/A] Exposed Structures: [2:Fat Layer (Subcutaneous N/A Tissue) Exposed: Yes Fascia: No Tendon: No Muscle: No Joint: No Bone: No] [N/A:N/A] Epithelialization: [2:Medium (34-66%)] [3:N/A] [N/A:N/A] Assessment Notes: [2:callus to periwound.] [3:N/A] [N/A:N/A] Procedures Performed: Total Contact Cast [3:N/A] [N/A:N/A] Electronic Signature(s) Signed: 11/29/2018 5:25:49 PM By: Linton Ham MD Signed: 11/29/2018 5:46:53 PM By: Levan Hurst RN, BSN Entered By: Linton Ham on 11/29/2018 09:27:18 -------------------------------------------------------------------------------- Multi-Disciplinary Care Plan  Details Patient Name: Date of Service: John Griffin 11/29/2018 8:00 AM Medical Record Number:[id number] Patient Account Number: [id number] Date of Birth/Sex: Treating RN: 12-31-1950 (68 y.o. [name] Primary Care Teghan Philbin: Christain [place] Other Clinician: Referring Nohea Kras: Treating Neeti Knudtson/Extender:Robson, Glendale Chard, Letta Moynahan in Treatment: 21 Active Inactive Wound/Skin Impairment Nursing Diagnoses: Impaired tissue integrity Knowledge deficit related to smoking impact on wound healing Knowledge deficit related to ulceration/compromised skin integrity Goals: Patient/caregiver will verbalize understanding of skin care regimen Date Initiated: 07/05/2018 Target Resolution Date: 12/17/2018 Goal Status: Active Ulcer/skin breakdown will have a volume reduction of 30% by week 4 Date Initiated: 07/05/2018 Date Inactivated: 08/02/2018 Target Resolution Date: 08/06/2018 Goal Status: Met Ulcer/skin breakdown will have a volume reduction of 50% by week 8 Date Initiated: 08/02/2018 Date Inactivated: 08/30/2018 Target Resolution Date: 09/03/2018 Goal Status: Met Ulcer/skin breakdown will have a volume reduction of 80% by week 12 Date Initiated: 08/30/2018 Date Inactivated: 10/11/2018 Target Resolution Date: 10/01/2018 Unmet Reason: unable to Goal Status: Unmet offload adequately Interventions: Assess patient/caregiver ability to obtain necessary supplies Assess patient/caregiver ability to perform ulcer/skin care regimen upon admission and as needed Assess ulceration(s) every visit Provide education on smoking Provide education on ulcer and skin care Notes: Electronic Signature(s) Signed: 11/29/2018 5:46:53 PM By: Levan Hurst RN, BSN Entered By: Levan Hurst on 11/29/2018 07:51:40 -------------------------------------------------------------------------------- Pain Assessment Details Patient Name: Date of Service: YANI, LAL 11/29/2018 8:00 AM Medical Record  Number:[id number] Patient Account Number: [id number] Date of Birth/Sex: Treating RN: Nov 13, 1950 (68 y.o. [name] Primary Care Mirranda Monrroy: Christain [place] Other Clinician: Referring Wendell Nicoson: Treating Shayne Diguglielmo/Extender:Robson, Glendale Chard, Letta Moynahan in Treatment: 21 Active Problems Location of Pain Severity and Description of Pain Patient Has Paino No Site Locations Rate the pain. Current Pain Level: 0 Pain Management and Medication Current Pain Management: Medication: No Cold Application: No Rest: No Massage: No Activity: No T.E.N.S.: No Heat Application: No Leg drop or elevation: No Is the Current Pain Management Adequate: Adequate How does your wound impact your activities of daily livingo Sleep: No Bathing: No Appetite: No Relationship With Others: No Bladder Continence: No Emotions: No Bowel Continence: No Work: No Toileting: No Drive: No Dressing: No Hobbies: No Electronic Signature(s) Signed: 11/29/2018 3:33:55 PM By: Deon Pilling Entered By: Deon Pilling on 11/29/2018 07:51:02 -------------------------------------------------------------------------------- Patient/Caregiver Education Details Patient Name: Date of Service: [name] 11/9/2020andnbsp8:00 AM Medical Record Number:[id number] Patient Account Number: [id number] Date of Birth/Gender: 12/07/1950 (68 y.o. M) Treating RN: Levan Hurst Primary Care Physician: Christain [place] Other Clinician: Referring Physician: Treating Physician/Extender:Robson, Glendale Chard, Letta Moynahan in Treatment: 21 Education Assessment Education Provided To: Patient Education Topics Provided Wound/Skin Impairment: Methods: Explain/Verbal Responses: State content correctly [employer]) Signed: 11/29/2018 5:46:53 PM By: Levan Hurst RN, BSN Entered By: Levan Hurst on 11/29/2018 07:51:51 -------------------------------------------------------------------------------- Wound Assessment  Details Patient Name: Date of Service: ROBBEN, JAGIELLO 11/29/2018 8:00 AM Medical Record  [id number] Patient Account Number: [id number] Date of Birth/Sex: Treating RN: Jun 02, 1950 (68 y.o. [name], Meta.Reding Primary Care Kareen Jefferys: Christain [place] Other Clinician: Referring Yulisa Chirico: Treating Talley Kreiser/Extender:Robson, Glendale Chard, Jama Flavors Weeks in Treatment: 21 Wound Status Wound Number: 2 Primary Neuropathic Ulcer-Non Diabetic Etiology: Wound Location: Left Calcaneus Wound Open Wounding Event: Gradually Appeared Status: Date Acquired: 01/20/2017 Comorbid Chronic Obstructive Pulmonary Disease Weeks Of Treatment: 21 History: (COPD), Sleep Apnea, Arrhythmia, Coronary Clustered Wound: No Artery Disease, Hypertension, Myocardial Infarction, Osteoarthritis Photos Wound Measurements Length: (cm) 0.4 % Reduct Width: (cm) 0.4 % Reduct Depth: (cm) 0.3 Epitheli Area: (cm) 0.126 Tunneli Volume: (cm) 0.038 Undermi Wound Description Classification: Full Thickness Without Exposed Support Foul Odo Structures Slough/F Wound Thickened Margin: Exudate Medium Amount: Exudate Serosanguineous Type: Exudate red, brown Color: Wound Bed Granulation Amount: Large (67-100%) Granulation Quality: Pink Fascia E Necrotic Amount: None Present (0%) Fat Laye Tendon E Muscle E Joint Ex Bone Exp r After Cleansing: No ibrino No Exposed Structure xposed: No r (Subcutaneous Tissue) Exposed: Yes xposed: No xposed: No posed: No osed: No ion in Area: 92% ion in Volume: 98.4% alization: Medium (34-66%) ng: No ning: No Assessment Notes callus to periwound. Treatment Notes Wound #2 (Left Calcaneus) 1. Cleanse With Wound Cleanser Soap and water 3. Primary Dressing Applied Endoform 4. Secondary Dressing Dry Gauze Foam Drawtex 5. Secured With Tape 7. Footwear/Offloading device applied Total Contact Cast Notes TCC applied by MD. Electronic Signature(s) Signed: 11/30/2018  4:05:15 PM By: Mikeal Hawthorne EMT/HBOT Signed: 12/01/2018 5:42:00 PM By: Deon Pilling Previous Signature: 11/29/2018 3:33:55 PM Version By: Deon Pilling Entered By: Mikeal Hawthorne on 11/30/2018 12:16:42 -------------------------------------------------------------------------------- Wound Assessment Details Patient Name: Date of Service: DURWOOD, DITTUS 11/29/2018 8:00 AM Medical Record [id number] Patient Account Number: [id number] Date of Birth/Sex: Treating RN: 07-Jan-1951 (69 y.o. [name], Meta.Reding Primary Care Lovina Zuver: Christain [place] Other Clinician: Referring Lilton Pare: Treating Bexley Laubach/Extender:Robson, Glendale Chard, Jama Flavors Weeks in Treatment: 21 Wound Status Wound Number: 3 Primary Skin Tear Etiology: Wound Location: Left Lower Leg - Anterior Wound Healed - Epithelialized Wounding Event: Shear/Friction Status: Date Acquired: 11/11/2018 Comorbid Chronic Obstructive Pulmonary Disease Weeks Of Treatment: 2 History: (COPD), Sleep Apnea, Arrhythmia, Coronary Clustered Wound: No Artery Disease, Hypertension, Myocardial Infarction, Osteoarthritis Photos Wound Measurements Length: (cm) 0 % Reduct Width: (cm) 0 % Reduct Depth: (cm) 0 Epitheli Area: (cm) 0 Volume: (cm) 0 Wound Description Classification: Full Thickness Without Exposed Support Foul Odo Structures Slough/F Wound Flat and Intact Margin: Exudate Small Amount: Exudate Serosanguineous Type: Exudate red, brown red, brown Color: Wound Bed Granulation Amount: Large (67-100%) Granulation Quality: Red Fascia Ex Necrotic Amount: None Present (0%) Fat Layer Tendon Ex Muscle Ex Joint Exp Bone Expo r After Cleansing: No ibrino No Exposed Structure posed: No (Subcutaneous Tissue) Exposed: Yes posed: No posed: No osed: No sed: No ion in Area: 100% ion in Volume: 100% alization: None Electronic Signature(s) Signed: 11/30/2018 4:05:15 PM By: Mikeal Hawthorne EMT/HBOT Signed: 12/01/2018 5:42:00  PM By: Deon Pilling Previous Signature: 11/29/2018 3:33:55 PM Version By: Deon Pilling Entered By: Mikeal Hawthorne on 11/30/2018 12:16:18 -------------------------------------------------------------------------------- Vitals Details Patient Name: Date of Service: DARRIO, BADE 11/29/2018 8:00 AM Medical Record [id number] Patient Account Number: [id number] Date of Birth/Sex: Treating RN: January 14, 1951 (68 y.o. [name] Primary Care Garnell Begeman: Christain [place] Other Clinician: Referring Oleva Koo: Treating Tommi Crepeau/Extender:Robson, Glendale Chard, Letta Moynahan in Treatment: 21 Vital Signs Time Taken: 07:50 Temperature (F): 98.5 Height (in): 76 Pulse (bpm): 77 Weight (lbs): 335 Respiratory Rate (breaths/min): 20 Body Mass  Index (BMI): 40.8 Blood Pressure (mmHg): 159/83 Reference Range: 80 - 120 mg / dl Electronic Signature(s) Signed: 11/29/2018 3:33:55 PM By: Deon Pilling Entered By: Deon Pilling on 11/29/2018 07:50:31

## 2018-12-06 ENCOUNTER — Other Ambulatory Visit: Payer: Self-pay

## 2018-12-06 ENCOUNTER — Encounter (HOSPITAL_BASED_OUTPATIENT_CLINIC_OR_DEPARTMENT_OTHER): Payer: Medicare Other | Admitting: Internal Medicine

## 2018-12-06 DIAGNOSIS — J449 Chronic obstructive pulmonary disease, unspecified: Secondary | ICD-10-CM | POA: Diagnosis not present

## 2018-12-06 DIAGNOSIS — R7303 Prediabetes: Secondary | ICD-10-CM | POA: Diagnosis not present

## 2018-12-06 DIAGNOSIS — G9009 Other idiopathic peripheral autonomic neuropathy: Secondary | ICD-10-CM | POA: Diagnosis not present

## 2018-12-06 DIAGNOSIS — I2510 Atherosclerotic heart disease of native coronary artery without angina pectoris: Secondary | ICD-10-CM | POA: Diagnosis not present

## 2018-12-06 DIAGNOSIS — R260 Ataxic gait: Secondary | ICD-10-CM | POA: Diagnosis not present

## 2018-12-06 DIAGNOSIS — L97422 Non-pressure chronic ulcer of left heel and midfoot with fat layer exposed: Secondary | ICD-10-CM | POA: Diagnosis not present

## 2018-12-06 DIAGNOSIS — L97421 Non-pressure chronic ulcer of left heel and midfoot limited to breakdown of skin: Secondary | ICD-10-CM | POA: Diagnosis not present

## 2018-12-06 NOTE — Progress Notes (Signed)
HYDEN, THAYN ([id number]) Visit Report for 12/06/2018 Debridement Details Patient Name: Date of Service: GARL, KRIZEK 12/06/2018 8:00 AM Medical Record Number:[id number] Patient Account Number: [id number] Date of Birth/Sex: 04-14-1950 (68 y.o. M) Treating RN: Levan Hurst Primary Care Provider: Christain Sacramento Other Clinician: Referring Provider: Treating Provider/Extender:Angeliah Wisdom, Glendale Chard, Letta Moynahan in Treatment: 22 Debridement Performed for Wound #2 Left Calcaneus Assessment: Performed By: Physician Ricard Dillon., MD Debridement Type: Debridement Level of Consciousness (Pre- Awake and Alert procedure): Pre-procedure Yes - 08:10 Verification/Time Out Taken: Start Time: 08:10 Pain Control: Lidocaine 5% topical ointment Total Area Debrided (L x W): 1 (cm) x 1.1 (cm) = 1.1 (cm) Tissue and other material Non-Viable, Callus, Eschar, Skin: Epidermis debrided: Level: Skin/Epidermis Debridement Description: Selective/Open Wound Instrument: Curette Bleeding: None End Time: 08:11 Procedural Pain: 0 Post Procedural Pain: 0 Response to Treatment: Procedure was tolerated well Level of Consciousness Awake and Alert (Post-procedure): Post Debridement Measurements of Total Wound Length: (cm) 1 Width: (cm) 1.1 Depth: (cm) 0.1 Volume: (cm) 0.086 Character of Wound/Ulcer Post Improved Debridement: Post Procedure Diagnosis Same as Pre-procedure Electronic Signature(s) Signed: 12/06/2018 5:46:17 PM By: Linton Ham MD Signed: 12/06/2018 5:59:11 PM By: Levan Hurst RN, BSN Entered By: Linton Ham on 12/06/2018 08:35:58 -------------------------------------------------------------------------------- HPI Details Patient Name: Date of Service: CRAIGE, RZEPKA 12/06/2018 8:00 AM Medical Record Number:[id number] Patient Account Number: [id number] Date of Birth/Sex: Treating RN: 09-24-1950 (68 y.o. [name] Primary Care Provider: Christain Sacramento Other  Clinician: Referring Provider: Treating Provider/Extender:Jamisha Hoeschen, Glendale Chard, Letta Moynahan in Treatment: 22 History of Present Illness HPI Description: ADMISSION 11/19/2017 This is a 68 year old man who is not a diabetic. He tells me that 6 months ago he noticed a callused area on his left heel that open into a wound. This is on the plantar aspect of his heel just near the tip of his calcaneus. He says that this is open and closed repetitively over the last 6 months. He recently saw his primary doctor who sent him here. He is been using Neosporin and a Band-Aid. He tells me that he follows with cardiology and he had a hemoglobin A1c but he does not know what it is but he has been told he is not a diabetic. Measurement on the wound was 0.2 x 1.5 with some undermining distally in this oval-shaped horizontally orientated wound. He does not feel any pain He tells me that for an indefinite period of time he has not had much feeling in his feet. He has not had a medical work-up for this. Our intake nurses noted reduced microfilament sensation The patient has a history of COPD, hypercholesterolemia, coronary artery disease, gastroesophageal reflux disease, prostate cancer treated with surgery, obstructive sleep apnea. He tells [place] that he twisted his back last week and he has a back brace he is also using a cane ABI in our clinic was 1.17 on the left 11/26/2017; the patient arrives with his wound looking a lot better. He has been offloading this using a heel offloading boot. Dimensions are a lot better. 12/03/2017; the area on the tip of his heel extending slightly into the plantar surface has no open area. Using a 5 curette and then a #10 scalpel I removed some callus but there is no open wound. I think this is going to be an offloading issue in a patient who has nondiabetic neuropathy. For now we are going to recommend a heel cup type heel protector and see if this will maintain skin integrity  in  this area. READMISSION 07/05/2018 Patient returns to clinic with a reopening of the left heel about a month and a half ago. He was in this clinic in the fall with a wound and roughly the same area. He has some form of idiopathic neuropathy not listed as a diabetic. Very ataxic wound. He also has leg length discrepancy secondary to a previous accident he had and according to his wife a lot of the pressure when he takes a step lands on the left heel. They have been using Neosporin more recently some leftover silver collagen. They did go into a heel offloading boot that we gave him last time. Past medical history as noted he is not a diabetic, he has idiopathic peripheral neuropathy, hypertension, coronary artery disease, obstructive sleep apnea on CPAP, prostate CA COPD. He is a heavy smoker ABI in our clinic was 1.05 on the left 6/22; x-ray I did last time showed no evidence of osteomyelitis. A wound culture showed Alcaligenes faecalis. I am not well familiar with this organism although I will try to look it up in more detail. In any case the wound is somewhat worse today with a small probing area off the base of the wound. Also problematic is that he does not seem to wear the heel offloading boot properly. We have emphasized that his foot has to be all the way forward, otherwise the heel will be on the floor. 6/29; the patient is completing his antibiotics. Depth of this is come in somewhat. He has been to see his primary doctor and diagnosed with prediabetes now on metformin 500 twice daily. 1 would wonder about the etiology of his neuropathy. They are making a better effort to use the heel offloading boot properly. He has significant gait ataxia I would be concerned about a total contact cast 7/6- Completed cipro, he continues to use the offloading shoe, he does have balance issues with right leg being shorter, has been changing dressing on heel every other day 7/13; he is using his  offloading shoe. Some improvement in length and width and depth. However this is a deep punched out wound on the left plantar heel. His right leg is shorter and his balance is ataxic secondary to a neuropathy. He is prediabetic on metformin. It would be difficult to see him in a total contact cast although he assures me along with his wife that they were doing as much pressure relief short of this they can 7/20; he is still in his heel offloading shoe. Absolutely no difference this week. Thick callus skin and subcutaneous tissue around the deep punched out wound. Required an extensive debridement. We have been using Prisma 7/27; he is still on his heel offloading shoe and according to the patient and his wife he is religiously offloading this. He has a little less depth to the wound certainly no change in the surface area. He continues to have thick callus skin and subcutaneous tissue around the wound edge which I continuously have to debride including today. If we can get this to a more superficial area that would be success. He is using silver collagen. He is not a candidate for a total contact cast secondary to gait and balance issues 08/23/18-Patient returns at 2 weeks for his left heel wound. This appears to be slightly bigger in dimension. Less necrotic tissue than before. Continue to use Prisma. 8/10-Patient returns at 1 week to continue care for his left heel wound, this is looking better than before, definitely less  necrotic tissue, continue to use Prisma 8/17; left plantar heel. Once again rolled up areas of skin and subcutaneous tissue around the wound. Wound surface is not too bad. We have been using Prisma 8/24; left plantar heel. Absolutely no change to rolled up skin and subcutaneous tissue around the wound removed with a #5 curette we have been using silver collagen for a long period of time without any improvement although I think this is largely a pressure issue. I have changed him  to [facility] today. Again we have talked about a total contact cast although this would add to his already left greater than right leg length discrepancy about an inch and a half according to the patient. 8/31; left plantar heel. Absolutely no change. Circular punched out wound with callus thick subcutaneous tissue around the wound margin. We have been using Hydrofera Blue 9/21; left plantar heel although the surface area of this is not changed the depth seems somewhat better. There is also not as much callus and thick subcutaneous tissue. We have been using Hydrofera Blue. He came in this day with a support on the right foot which is his shorter leg. We are using a heel offloading boot on the left. 9/28 left plantar heel. Using Hydrofera Blue. We have improvement in measurements but this is still a relatively punched-out area with evidence of excessive pressure 10/5; left plantar heel using Hydrofera Blue surface area measurements are not different however there is certainly less callus less rolled edges around the wound. No debridement today. I am continuing Hydrofera Blue for another week. There is no further option for offloading other than the heel offloading boot that he currently uses 10/12; no different from last week using Hydrofera Blue. Still requiring debridement. I believe this is an offloading issue 10/19; we switch to endoform last week. Surface area is improved and the wound certainly looks smaller from that surface area point of view but the depth is about the same. Extensive discussion today about a total contact cast. His wife is present. She expressed both of their desire to get this wound healed and they want to try the cast. We went over risks including falls, fractures, problems with people with restless legs etc. etc. the bottom line is that we agreed to try this. He will be back on Thursday for the obligatory total contact cast change 10/22; back for the obligatory  total contact cast change. He had an injury and a new open area at the top of the cast over the anterior tibia. This is superficial. Apparently this happened the next day after we put it on but he would not allow his wife to call and tell us. He cut out the area of the cast. Fortunately the wound that we have been following him for looks better. This is on the left heel more superficial healthier looking granulation. Using silver collagen 10/26; the wound on his left heel looks smaller in terms of surface area but the depth is still the same. This has come in fairly well however. The cast injury on the anterior tibia is about the same. We are using collagen on the heel silver alginate on the cast injury site on the left mid tibia area. He tells me he was up on his feet yesterday quite a bit because of car problems 11/2; left heel wound perhaps some improvement in depth and less callus however not as dramatic changes I was hoping to see. The cast injury on the mid anterior  tibia looks better. Using silver alginate 11/9 left heel wound is improved. Dimensions are smaller. There is still some depth with some undermining. Less callus and thick subcutaneous tissue around the wound than I am used to seeing. This reflects improvement with offloading in a total contact cast 11/16; continued improvement in the left heel. Dimensions are smaller he arrived in clinic with eschar callus and dry skin over the wound. Electronic Signature(s) Signed: 12/06/2018 5:46:17 PM By: Linton Ham MD Entered By: Linton Ham on 12/06/2018 08:36:36 -------------------------------------------------------------------------------- Physical Exam Details Patient Name: Date of Service: KHASE, KAPPEL 12/06/2018 8:00 AM Medical Record Number:[id number] Patient Account Number: [id number] Date of Birth/Sex: Treating RN: [birth year] (68 y.o. [name] Primary Care Provider: Christain Sacramento Other  Clinician: Referring Provider: Treating Provider/Extender:Issabelle Mcraney, Glendale Chard, Letta Moynahan in Treatment: 22 Constitutional Sitting or standing Blood Pressure is within target range for patient.. Pulse regular and within target range for patient.. Temperature is normal and within the target range for the patient.Marland Kitchen Appears in no distress. Notes Wound exam left plantar heel. No erythema. Callus and subcutaneous tissue over the wound surface. Using a #5 curette this was removed. Only a very small superficial wound remains Electronic Signature(s) Signed: 12/06/2018 5:46:17 PM By: Linton Ham MD Entered By: Linton Ham on 12/06/2018 08:37:17 -------------------------------------------------------------------------------- Physician Orders Details Patient Name: Date of Service: JAMI, SEABORN 12/06/2018 8:00 AM Medical Record Number:[id number] Patient Account Number: [id number] Date of Birth/Sex: Treating RN: [birth year] (68 y.o. [name] Primary Care Provider: Christain Sacramento Other Clinician: Referring Provider: Treating Provider/Extender:Amando Chaput, Glendale Chard, Letta Moynahan in Treatment: 22 Verbal / Phone Orders: No Diagnosis Coding ICD-10 Coding Code Description [phone] Non-pressure chronic ulcer of left heel and midfoot with fat layer exposed G90.09 Other idiopathic peripheral autonomic neuropathy R73.03 Prediabetes Follow-up Appointments Return Appointment in 1 week. Dressing Change Frequency Wound #2 Left Calcaneus Do not change entire dressing for one week. Skin Barriers/Peri-Wound Care Wound #2 Left Calcaneus Moisturizing lotion Wound Cleansing Wound #2 Left Calcaneus May shower with protection. Primary Wound Dressing Wound #2 Left Calcaneus Endoform - moisten with normal saline Secondary Dressing Wound #2 Left Calcaneus Foam - also apply foam to shin for protection Dry Gauze Off-Loading Total Contact Cast to Left Lower Extremity - Patient to  please call if the cast starts rubbing or becomes loose. Other: - Gloped heel offloader sandal to left foot - extra felt for padding Limit your walking and standing on your foot. Electronic Signature(s) Signed: 12/06/2018 5:46:17 PM By: Linton Ham MD Signed: 12/06/2018 5:59:11 PM By: Levan Hurst RN, BSN Entered By: Levan Hurst on 12/06/2018 08:13:35 -------------------------------------------------------------------------------- Problem List Details Patient Name: Date of Service: LYNKOLN, WEIGMAN 12/06/2018 8:00 AM Medical Record Number:[id number] Patient Account Number: [id number] Date of Birth/Sex: Treating RN: August 16, 1950 (68 y.o. [name] Primary Care Provider: Christain Sacramento Other Clinician: Referring Provider: Treating Provider/Extender:Jaysen Wey, Glendale Chard, Jama Flavors Weeks in Treatment: 22 Active Problems ICD-10 Evaluated Encounter Code Description Active Date Today Diagnosis L97.422 Non-pressure chronic ulcer of left heel and midfoot 07/05/2018 No Yes with fat layer exposed G90.09 Other idiopathic peripheral autonomic neuropathy 07/05/2018 No Yes R73.03 Prediabetes 07/19/2018 No Yes Inactive Problems ICD-10 Code Description Active Date Inactive Date S80.12XD Contusion of left lower leg, subsequent encounter 11/11/2018 11/11/2018 Resolved Problems Electronic Signature(s) Signed: 12/06/2018 5:46:17 PM By: Linton Ham MD Entered By: Linton Ham on 12/06/2018 08:35:32 -------------------------------------------------------------------------------- Progress Note Details Patient Name: Date of Service: PAULMICHAEL, MANGIAPANE 12/06/2018 8:00 AM Medical Record Number:[id number] Patient Account Number: [id number]  Date of Birth/Sex: Treating RN: 03-Oct-1950 (68 y.o. [name] Primary Care Provider: Christain Sacramento Other Clinician: Referring Provider: Treating Provider/Extender:Ranell Skibinski, Glendale Chard, Letta Moynahan in Treatment: 22 Subjective History of  Present Illness (HPI) ADMISSION 11/19/2017 This is a 68 year old man who is not a diabetic. He tells me that 6 months ago he noticed a callused area on his left heel that open into a wound. This is on the plantar aspect of his heel just near the tip of his calcaneus. He says that this is open and closed repetitively over the last 6 months. He recently saw his primary doctor who sent him here. He is been using Neosporin and a Band-Aid. He tells me that he follows with cardiology and he had a hemoglobin A1c but he does not know what it is but he has been told he is not a diabetic. Measurement on the wound was 0.2 x 1.5 with some undermining distally in this oval-shaped horizontally orientated wound. He does not feel any pain He tells me that for an indefinite period of time he has not had much feeling in his feet. He has not had a medical work-up for this. Our intake nurses noted reduced microfilament sensation The patient has a history of COPD, hypercholesterolemia, coronary artery disease, gastroesophageal reflux disease, prostate cancer treated with surgery, obstructive sleep apnea. He tells [place] that he twisted his back last week and he has a back brace he is also using a cane ABI in our clinic was 1.17 on the left 11/26/2017; the patient arrives with his wound looking a lot better. He has been offloading this using a heel offloading boot. Dimensions are a lot better. 12/03/2017; the area on the tip of his heel extending slightly into the plantar surface has no open area. Using a 5 curette and then a #10 scalpel I removed some callus but there is no open wound. I think this is going to be an offloading issue in a patient who has nondiabetic neuropathy. For now we are going to recommend a heel cup type heel protector and see if this will maintain skin integrity in this area. READMISSION 07/05/2018 Patient returns to clinic with a reopening of the left heel about a month and a half ago. He was in  this clinic in the fall with a wound and roughly the same area. He has some form of idiopathic neuropathy not listed as a diabetic. Very ataxic wound. He also has leg length discrepancy secondary to a previous accident he had and according to his wife a lot of the pressure when he takes a step lands on the left heel. They have been using Neosporin more recently some leftover silver collagen. They did go into a heel offloading boot that we gave him last time. Past medical history as noted he is not a diabetic, he has idiopathic peripheral neuropathy, hypertension, coronary artery disease, obstructive sleep apnea on CPAP, prostate CA COPD. He is a heavy smoker ABI in our clinic was 1.05 on the left 6/22; x-ray I did last time showed no evidence of osteomyelitis. A wound culture showed Alcaligenes faecalis. I am not well familiar with this organism although I will try to look it up in more detail. In any case the wound is somewhat worse today with a small probing area off the base of the wound. Also problematic is that he does not seem to wear the heel offloading boot properly. We have emphasized that his foot has to be all  the way forward, otherwise the heel will be on the floor. 6/29; the patient is completing his antibiotics. Depth of this is come in somewhat. He has been to see his primary doctor and diagnosed with prediabetes now on metformin 500 twice daily. 1 would wonder about the etiology of his neuropathy. They are making a better effort to use the heel offloading boot properly. He has significant gait ataxia I would be concerned about a total contact cast 7/6- Completed cipro, he continues to use the offloading shoe, he does have balance issues with right leg being shorter, has been changing dressing on heel every other day 7/13; he is using his offloading shoe. Some improvement in length and width and depth. However this is a deep punched out wound on the left plantar heel. His right leg  is shorter and his balance is ataxic secondary to a neuropathy. He is prediabetic on metformin. It would be difficult to see him in a total contact cast although he assures me along with his wife that they were doing as much pressure relief short of this they can 7/20; he is still in his heel offloading shoe. Absolutely no difference this week. Thick callus skin and subcutaneous tissue around the deep punched out wound. Required an extensive debridement. We have been using Prisma 7/27; he is still on his heel offloading shoe and according to the patient and his wife he is religiously offloading this. He has a little less depth to the wound certainly no change in the surface area. He continues to have thick callus skin and subcutaneous tissue around the wound edge which I continuously have to debride including today. If we can get this to a more superficial area that would be success. He is using silver collagen. He is not a candidate for a total contact cast secondary to gait and balance issues 08/23/18-Patient returns at 2 weeks for his left heel wound. This appears to be slightly bigger in dimension. Less necrotic tissue than before. Continue to use Prisma. 8/10-Patient returns at 1 week to continue care for his left heel wound, this is looking better than before, definitely less necrotic tissue, continue to use Prisma 8/17; left plantar heel. Once again rolled up areas of skin and subcutaneous tissue around the wound. Wound surface is not too bad. We have been using Prisma 8/24; left plantar heel. Absolutely no change to rolled up skin and subcutaneous tissue around the wound removed with a #5 curette we have been using silver collagen for a long period of time without any improvement although I think this is largely a pressure issue. I have changed him to [facility] today. Again we have talked about a total contact cast although this would add to his already left greater than right leg  length discrepancy about an inch and a half according to the patient. 8/31; left plantar heel. Absolutely no change. Circular punched out wound with callus thick subcutaneous tissue around the wound margin. We have been using Hydrofera Blue 9/21; left plantar heel although the surface area of this is not changed the depth seems somewhat better. There is also not as much callus and thick subcutaneous tissue. We have been using Hydrofera Blue. He came in this day with a support on the right foot which is his shorter leg. We are using a heel offloading boot on the left. 9/28 left plantar heel. Using Hydrofera Blue. We have improvement in measurements but this is still a relatively punched-out area with evidence of  excessive pressure 10/5; left plantar heel using Hydrofera Blue surface area measurements are not different however there is certainly less callus less rolled edges around the wound. No debridement today. I am continuing Hydrofera Blue for another week. There is no further option for offloading other than the heel offloading boot that he currently uses 10/12; no different from last week using Hydrofera Blue. Still requiring debridement. I believe this is an offloading issue 10/19; we switch to endoform last week. Surface area is improved and the wound certainly looks smaller from that surface area point of view but the depth is about the same. Extensive discussion today about a total contact cast. His wife is present. She expressed both of their desire to get this wound healed and they want to try the cast. We went over risks including falls, fractures, problems with people with restless legs etc. etc. the bottom line is that we agreed to try this. He will be back on Thursday for the obligatory total contact cast change 10/22; back for the obligatory total contact cast change. He had an injury and a new open area at the top of the cast over the anterior tibia. This is superficial.  Apparently this happened the next day after we put it on but he would not allow his wife to call and tell us. He cut out the area of the cast. Fortunately the wound that we have been following him for looks better. This is on the left heel more superficial healthier looking granulation. Using silver collagen 10/26; the wound on his left heel looks smaller in terms of surface area but the depth is still the same. This has come in fairly well however. The cast injury on the anterior tibia is about the same. We are using collagen on the heel silver alginate on the cast injury site on the left mid tibia area. He tells me he was up on his feet yesterday quite a bit because of car problems 11/2; left heel wound perhaps some improvement in depth and less callus however not as dramatic changes I was hoping to see. The cast injury on the mid anterior tibia looks better. Using silver alginate 11/9 left heel wound is improved. Dimensions are smaller. There is still some depth with some undermining. Less callus and thick subcutaneous tissue around the wound than I am used to seeing. This reflects improvement with offloading in a total contact cast 11/16; continued improvement in the left heel. Dimensions are smaller he arrived in clinic with eschar callus and dry skin over the wound. Objective Constitutional Sitting or standing Blood Pressure is within target range for patient.. Pulse regular and within target range for patient.. Temperature is normal and within the target range for the patient.Marland Kitchen Appears in no distress. Vitals Time Taken: 7:50 AM, Height: 76 in, Weight: 335 lbs, BMI: 40.8, Temperature: 98.2 F, Pulse: 85 bpm, Respiratory Rate: 18 breaths/min, Blood Pressure: 122/76 mmHg. General Notes: Wound exam left plantar heel. No erythema. Callus and subcutaneous tissue over the wound surface. Using a #5 curette this was removed. Only a very small superficial wound remains Integumentary (Hair,  Skin) Wound #2 status is Open. Original cause of wound was Gradually Appeared. The wound is located on the Left Calcaneus. The wound measures 1cm length x 1.1cm width x 0.1cm depth; 0.864cm^2 area and 0.086cm^3 volume. There is no tunneling or undermining noted. There is a none present amount of drainage noted. The wound margin is thickened. There is no granulation within the wound  bed. There is no necrotic tissue within the wound bed. Assessment Active Problems ICD-10 Non-pressure chronic ulcer of left heel and midfoot with fat layer exposed Other idiopathic peripheral autonomic neuropathy Prediabetes Procedures Wound #2 Pre-procedure diagnosis of Wound #2 is a Neuropathic Ulcer-Non Diabetic located on the Left Calcaneus . There was a Selective/Open Wound Skin/Epidermis Debridement with a total area of 1.1 sq cm performed by Ricard Dillon., MD. With the following instrument(s): Curette to remove Non-Viable tissue/material. Material removed includes Eschar, Callus, and Skin: Epidermis after achieving pain control using Lidocaine 5% topical ointment. No specimens were taken. A time out was conducted at 08:10, prior to the start of the procedure. There was no bleeding. The procedure was tolerated well with a pain level of 0 throughout and a pain level of 0 following the procedure. Post Debridement Measurements: 1cm length x 1.1cm width x 0.1cm depth; 0.086cm^3 volume. Character of Wound/Ulcer Post Debridement is improved. Post procedure Diagnosis Wound #2: Same as Pre-Procedure Pre-procedure diagnosis of Wound #2 is a Neuropathic Ulcer-Non Diabetic located on the Left Calcaneus . There was a Total Contact Cast Procedure by Ricard Dillon., MD. Post procedure Diagnosis Wound #2: Same as Pre-Procedure Plan Follow-up Appointments: Return Appointment in 1 week. Dressing Change Frequency: Wound #2 Left Calcaneus: Do not change entire dressing for one week. Skin Barriers/Peri-Wound  Care: Wound #2 Left Calcaneus: Moisturizing lotion Wound Cleansing: Wound #2 Left Calcaneus: May shower with protection. Primary Wound Dressing: Wound #2 Left Calcaneus: Endoform - moisten with normal saline Secondary Dressing: Wound #2 Left Calcaneus: Foam - also apply foam to shin for protection Dry Gauze Off-Loading: Total Contact Cast to Left Lower Extremity - Patient to please call if the cast starts rubbing or becomes loose. Other: - Gloped heel offloader sandal to left foot - extra felt for padding Limit your walking and standing on your foot. 1. Continue with endoform under a total contact cast 2. I have asked him to bring what ever foot where he is going to transition into next week. Also to Dr. Felicie Morn insoles which we will likely modify to try and offload this area 3. Failing this he will need custom made shoes which may or may not be covered by his insurance. He is a prediabetic however I am not sure how insurance companies look at this [employer]) Signed: 12/06/2018 5:46:17 PM By: Linton Ham MD Entered By: Linton Ham on 12/06/2018 08:38:07 -------------------------------------------------------------------------------- Total Contact Cast Details Patient Name: Date of Service: JOSEARMANDO, EVATT 12/06/2018 8:00 AM Medical Record Number:[id number] Patient Account Number: [id number] Date of Birth/Sex: 03-Oct-1950 (68 y.o. M) Treating RN: Levan Hurst Primary Care Provider: Christain [place] Other Clinician: Referring Provider: Treating Provider/Extender:Jamayah Myszka, Glendale Chard, Letta Moynahan in Treatment: 22 Total Contact Cast Applied for Wound Assessment: Wound #2 Left Calcaneus Performed By: Physician Ricard Dillon., MD Post Procedure Diagnosis Same as Pre-procedure Electronic Signature(s) Signed: 12/06/2018 5:46:17 PM By: Linton Ham MD Entered By: Linton Ham on 12/06/2018  08:36:05 -------------------------------------------------------------------------------- SuperBill Details Patient Name: Date of Service: IZYK, GOLDFINE 12/06/2018 Medical Record Number:[id number] Patient Account Number: [id number] Date of Birth/Sex: Treating RN: 01/18/1951 (68 y.o. [name] Primary Care Provider: Christain [place] Other Clinician: Referring Provider: Treating Provider/Extender:Aman Batley, Glendale Chard, Letta Moynahan in Treatment: 22 Diagnosis Coding ICD-10 Codes Code Description [phone] Non-pressure chronic ulcer of left heel and midfoot with fat layer exposed G90.09 Other idiopathic peripheral autonomic neuropathy R73.03 Prediabetes Facility Procedures The patient participates with Medicare or their insurance follows the Medicare  Facility Guidelines: CPT4 Code Description Modifier Quantity [id number] - DEBRIDE WOUND 1ST 20 SQ CM OR < 1 ICD-10 Diagnosis Description L97.422 Non-pressure chronic ulcer  of left heel and midfoot with fat layer exposed R73.03 Prediabetes G90.09 Other idiopathic peripheral autonomic neuropathy Physician Procedures CPT4 Code Description: [id number] - WC PHYS DEBR WO ANESTH 20 SQ CM ICD-10 Diagnosis Description L97.422 Non-pressure chronic ulcer of left heel and midfoot with R73.03 Prediabetes G90.09 Other idiopathic peripheral autonomic neuropathy Modifier: fat layer exp Quantity: 1 osed Electronic Signature(s) Signed: 12/06/2018 5:46:17 PM By: Linton Ham MD Entered By: Linton Ham on 12/06/2018 08:38:22

## 2018-12-09 NOTE — Progress Notes (Signed)
John Griffin John Griffin Griffin, John Griffin John Griffin Griffin ([id number]) Visit Report for 12/06/2018 Arrival Information Details Patient Name: Date of Service: John Griffin, John Griffin Griffin 12/06/2018 8:00 AM Medical Record Number:[id number] Patient Account Number: [id number] Date of Birth/Sex: Treating RN: 04-17-1950 (68 y.o. John Griffin John Griffin Griffin, John Griffin John Griffin Griffin Primary Care Arianah Torgeson: Christain [place] Other Clinician: Referring Jenah Vanasten: Treating Laniece Hornbaker/Extender:Robson, Glendale Chard, Letta Moynahan in Treatment: 67 Visit Information History Since Last Visit Added or deleted any medications: No Patient Arrived: Ambulatory Any new allergies or adverse reactions: No Arrival Time: 07:50 Had a fall or experienced change in No Accompanied By: self activities of daily living that may affect Transfer Assistance: None risk of falls: Patient Requires Transmission-Based No Signs or symptoms of abuse/neglect since No Precautions: last visito Patient Has Alerts: No Hospitalized since last visit: No Implantable device outside of the clinic No excluding cellular tissue based products placed in the center since last visit: Has Dressing in Place as Prescribed: Yes Has Footwear/Offloading in Place as Yes Prescribed: Left: Total Contact Cast Pain Present Now: No Electronic Signature(s) Signed: 12/09/2018 6:05:31 PM By: Kela Millin Entered By: Kela Millin on 12/06/2018 08:01:45 -------------------------------------------------------------------------------- Encounter Discharge Information Details Patient Name: Date of Service: John Griffin Griffin, John Griffin Griffin 12/06/2018 8:00 AM Medical Record [id number] Patient Account Number: [id number] Date of Birth/Sex: Treating RN: 04-28-1950 (68 y.o. John Griffin John Griffin Griffin Primary Care Derry Arbogast: Christain [place] Other Clinician: Referring Ianmichael Amescua: Treating Ashok Sawaya/Extender:Robson, Glendale Chard, Letta Moynahan in Treatment: 22 Encounter Discharge Information Items Post Procedure Vitals Discharge Condition: Stable Temperature  (F): 98.2 Ambulatory Status: Ambulatory Pulse (bpm): 85 Discharge Destination: Home Respiratory Rate (breaths/min): 18 Transportation: Private Auto Blood Pressure (mmHg): 122/76 Accompanied By: self Schedule Follow-up Appointment: Yes Clinical Summary of Care: Electronic Signature(s) Signed: 12/06/2018 5:37:24 PM By: Deon Pilling Entered By: Deon Pilling on 12/06/2018 08:35:42 -------------------------------------------------------------------------------- Lower Extremity Assessment Details Patient Name: Date of Service: John Griffin, John Griffin Griffin 12/06/2018 8:00 AM Medical Record Number:[id number] Patient Account Number: [id number] Date of Birth/Sex: Treating RN: Oct 15, 1950 (68 y.o. John Griffin John Griffin Griffin Primary Care Sheryll Dymek: Christain [place] Other Clinician: Referring Dwane Andres: Treating Jeraldin Fesler/Extender:Robson, Glendale Chard, Jama Flavors Weeks in Treatment: 22 Edema Assessment Assessed: [Left: No] [Right: No] Edema: [Left: Yes] [Right: No] Calf Left: Right: Point of Measurement: 44 cm From Medial Instep 38.5 cm cm Ankle Left: Right: Point of Measurement: 11 cm From Medial Instep 23.5 cm cm Vascular Assessment Pulses: Dorsalis Pedis Palpable: [Left:Yes] Electronic Signature(s) Signed: 12/09/2018 6:05:31 PM By: Kela Millin Entered By: Kela Millin on 12/06/2018 08:02:04 -------------------------------------------------------------------------------- Multi Wound Chart Details Patient Name: Date of Service: John Griffin, John Griffin Griffin 12/06/2018 8:00 AM Medical Record Number:[id number] Patient Account Number: [id number] Date of Birth/Sex: Treating RN: [birth year] (68 y.o. John Griffin John Griffin Griffin Primary Care Edgerrin Correia: Christain [place] Other Clinician: Referring Danesha Kirchoff: Treating Mashal Slavick/Extender:Robson, Glendale Chard, Letta Moynahan in Treatment: 22 Vital Signs Height(in): 42 Pulse(bpm): 54 Weight(lbs): 335 Blood Pressure(mmHg): 122/76 Body Mass Index(BMI): 41 Temperature(F):  98.2 Respiratory 18 Rate(breaths/min): Photos: [2:No Photos] [N/A:N/A] Wound Location: [2:Left Calcaneus] [N/A:N/A] Wounding Event: [2:Gradually Appeared] [N/A:N/A] Primary Etiology: [2:Neuropathic Ulcer-Non Diabetic] [N/A:N/A] Comorbid History: [2:Chronic Obstructive Pulmonary Disease (COPD), Sleep Apnea, Arrhythmia, Coronary Artery Disease, Hypertension, Myocardial Infarction, Osteoarthritis] [N/A:N/A] Date Acquired: [2:01/20/2017] [N/A:N/A] Weeks of Treatment: [2:22] [N/A:N/A] Wound Status: [2:Open] [N/A:N/A] Measurements L x W x D 1x1.1x0.1 [N/A:N/A] (cm) Area (cm) : [2:0.864] [N/A:N/A] Volume (cm) : [2:0.086] [N/A:N/A] % Reduction in Area: [2:45.00%] [N/A:N/A] % Reduction in Volume: 96.30% [N/A:N/A] Classification: [2:Full Thickness Without Exposed Support Structures] [N/A:N/A] Exudate Amount: [2:None Present] [N/A:N/A] Wound Margin: [2:Thickened] [N/A:N/A] Granulation Amount: [2:None Present (0%)] [N/A:N/A] Necrotic Amount: [2:None Present (  0%)] [N/A:N/A] Exposed Structures: [2:Fascia: No Fat Layer (Subcutaneous Tissue) Exposed: No Tendon: No Muscle: No Joint: No Bone: No] [N/A:N/A] Epithelialization: [2:Medium (34-66%)] [N/A:N/A] Debridement: [2:Debridement - Selective/Open Wound] [N/A:N/A] Pre-procedure [2:08:10] [N/A:N/A] Verification/Time Out Taken: Pain Control: [2:Lidocaine 5% topical ointment] [N/A:N/A] Tissue Debrided: [2:Callus] [N/A:N/A] Level: [2:Non-Viable Tissue] [N/A:N/A] Debridement Area (sq cm):1.1 [N/A:N/A] Instrument: [2:Curette] [N/A:N/A] Bleeding: [2:None] [N/A:N/A] Procedural Pain: [2:0] [N/A:N/A] Post Procedural Pain: [2:0] [N/A:N/A] Debridement Treatment Procedure was tolerated [N/A:N/A] Response: [2:well] Post Debridement [2:1x1.1x0.1] [N/A:N/A] Measurements L x W x D (cm) Post Debridement [2:0.086] [N/A:N/A] Volume: (cm) Procedures Performed: Debridement [2:Total Contact Cast] [N/A:N/A] Treatment Notes Wound #2 (Left Calcaneus) 1.  Cleanse With Wound Cleanser Soap and water 3. Primary Dressing Applied Endoform 4. Secondary Dressing Dry Gauze Foam 5. Secured With Medipore tape 7. Footwear/Offloading device applied Total Contact Cast Notes TCC applied by MD. Electronic Signature(s) Signed: 12/06/2018 5:46:17 PM By: Linton Ham MD Signed: 12/06/2018 5:59:11 PM By: Levan Hurst RN, BSN Entered By: Linton Ham on 12/06/2018 08:35:37 -------------------------------------------------------------------------------- [place] Details Patient Name: Date of Service: John Griffin, John Griffin Griffin 12/06/2018 8:00 AM Medical Record Number:[id number] Patient Account Number: [id number] Date of Birth/Sex: Treating RN: 1950-05-24 (68 y.o. John Griffin John Griffin Griffin Primary Care Kaniesha Barile: Christain [place] Other Clinician: Referring Pax Reasoner: Treating Jessiah Wojnar/Extender:Robson, Glendale Chard, Letta Moynahan in Treatment: 22 Active Inactive Wound/Skin Impairment Nursing Diagnoses: Impaired tissue integrity Knowledge deficit related to smoking impact on wound healing Knowledge deficit related to ulceration/compromised skin integrity Goals: Patient/caregiver will verbalize understanding of skin care regimen Date Initiated: 07/05/2018 Target Resolution Date: 12/17/2018 Goal Status: Active Ulcer/skin breakdown will have a volume reduction of 30% by week 4 Date Initiated: 07/05/2018 Date Inactivated: 08/02/2018 Target Resolution Date: 08/06/2018 Goal Status: Met Ulcer/skin breakdown will have a volume reduction of 50% by week 8 Date Initiated: 08/02/2018 Date Inactivated: 08/30/2018 Target Resolution Date: 09/03/2018 Goal Status: Met Ulcer/skin breakdown will have a volume reduction of 80% by week 12 Date Initiated: 08/30/2018 Date Inactivated: 10/11/2018 Target Resolution Date: 10/01/2018 Unmet Reason: unable to Goal Status: Unmet offload adequately Interventions: Assess patient/caregiver ability to obtain necessary  supplies Assess patient/caregiver ability to perform ulcer/skin care regimen upon admission and as needed Assess ulceration(s) every visit Provide education on smoking Provide education on ulcer and skin care Notes: Electronic Signature(s) Signed: 12/06/2018 5:59:11 PM By: Levan Hurst RN, BSN Entered By: Levan Hurst on 12/06/2018 08:05:51 -------------------------------------------------------------------------------- Pain Assessment Details Patient Name: Date of Service: John Griffin, John Griffin Griffin 12/06/2018 8:00 AM Medical Record [id number] Patient Account Number: [id number] Date of Birth/Sex: Treating RN: 25-Feb-1950 (68 y.o. John Griffin John Griffin Griffin Primary Care Anneth Brunell: Christain [place] Other Clinician: Referring Ellorie Kindall: Treating Domingo Fuson/Extender:Robson, Glendale Chard, Letta Moynahan in Treatment: 22 Active Problems Location of Pain Severity and Description of Pain Patient Has Paino No Site Locations Rate the pain. Current Pain Level: 0 Pain Management and Medication Current Pain Management: Medication: No Cold Application: No Rest: No Massage: No Activity: No T.E.N.S.: No Heat Application: No Leg drop or elevation: No Is the Current Pain Management Adequate: Adequate How does your wound impact your activities of daily livingo Sleep: No Bathing: No Appetite: No Relationship With Others: No Bladder Continence: No Emotions: No Bowel Continence: No Work: No Toileting: No Drive: No Dressing: No Hobbies: No Electronic Signature(s) Signed: 12/06/2018 5:37:24 PM By: Deon Pilling Signed: 12/09/2018 6:05:31 PM By: Kela Millin Entered By: Kela Millin on 12/06/2018 08:01:55 -------------------------------------------------------------------------------- Patient/Caregiver Education Details Patient Name: John Griffin, John Griffin Griffin 11/16/2020andnbsp8:00 Date of Service: AM Medical Record [id number] Number: Patient Account Number: [id number] Treating  RN: Date of  Birth/Gender: [birth year] (68 y.o. John Griffin John Griffin Griffin) Other Clinician: Primary Care Physician: Christain [place] Treating Linton Ham Referring Physician: Physician/Extender: Mindi Junker in Treatment: 22 Education Assessment Education Provided To: Patient Education Topics Provided Smoking and Wound Healing: Methods: Explain/Verbal Responses: State content correctly Wound/Skin Impairment: Methods: Explain/Verbal Responses: State content correctly Electronic Signature(s) Signed: 12/06/2018 5:59:11 PM By: Levan Hurst RN, BSN Entered By: Levan Hurst on 12/06/2018 32:54:98 -------------------------------------------------------------------------------- Wound Assessment Details Patient Name: Date of Service: John Griffin, John Griffin Griffin 12/06/2018 8:00 AM Medical Record Number:[id number] Patient Account Number: [id number] Date of Birth/Sex: Treating RN: Feb 05, 1950 (68 y.o. John Griffin John Griffin Griffin Primary Care Annelie Boak: Christain [place] Other Clinician: Referring Jahbari Repinski: Treating Agripina Guyette/Extender:Robson, Glendale Chard, Jama Flavors Weeks in Treatment: 22 Wound Status Wound Number: 2 Primary Neuropathic Ulcer-Non Diabetic Etiology: Wound Location: Left Calcaneus Wound Open Wounding Event: Gradually Appeared Status: Date Acquired: 01/20/2017 Comorbid Chronic Obstructive Pulmonary Disease Weeks Of Treatment: 22 History: (COPD), Sleep Apnea, Arrhythmia, Coronary Clustered Wound: No Artery Disease, Hypertension, Myocardial Infarction, Osteoarthritis Photos Wound Measurements Length: (cm) 1 % Reducti Width: (cm) 1.1 % Reducti Depth: (cm) 0.1 Epithelia Area: (cm) 0.864 Tunnelin Volume: (cm) 0.086 Undermin Wound Description Classification: Full Thickness Without Exposed Support Foul Odo Structures Slough/F Wound Thickened Margin: Exudate None Present Amount: Wound Bed Granulation Amount: None Present (0%) Necrotic Amount: None Present (0%) Fascia E Fat Laye Tendon E Muscle  E Joint Ex Bone Exp r After Cleansing: No ibrino No Exposed Structure xposed: No r (Subcutaneous Tissue) Exposed: No xposed: No xposed: No posed: No osed: No on in Area: 45% on in Volume: 96.3% lization: Medium (34-66%) g: No ing: No Treatment Notes Wound #2 (Left Calcaneus) 1. Cleanse With Wound Cleanser Soap and water 3. Primary Dressing Applied Endoform 4. Secondary Dressing Dry Gauze Foam 5. Secured With Medipore tape 7. Footwear/Offloading device applied Total Contact Cast Notes TCC applied by MD. Electronic Signature(s) Signed: 12/07/2018 4:00:22 PM By: Mikeal Hawthorne EMT/HBOT Signed: 12/09/2018 6:05:31 PM By: Kela Millin Entered By: Mikeal Hawthorne on 12/07/2018 09:22:10 -------------------------------------------------------------------------------- Vitals Details Patient Name: Date of Service: John Griffin, John Griffin Griffin 12/06/2018 8:00 AM Medical Record Number:[id number] Patient Account Number: [id number] Date of Birth/Sex: Treating RN: 01-Apr-1950 (68 y.o. John Griffin John Griffin Griffin, John Griffin John Griffin Griffin Primary Care Keelon Zurn: Christain [place] Other Clinician: Referring Kavya Haag: Treating Shawneequa Baldridge/Extender:Robson, Glendale Chard, Letta Moynahan in Treatment: 22 Vital Signs Time Taken: 07:50 Temperature (F): 98.2 Height (in): 76 Pulse (bpm): 85 Weight (lbs): 335 Respiratory Rate (breaths/min): 18 Body Mass Index (BMI): 40.8 Blood Pressure (mmHg): 122/76 Reference Range: 80 - 120 mg / dl Electronic Signature(s) Signed: 12/09/2018 6:05:31 PM By: Kela Millin Entered By: Kela Millin on 12/06/2018 08:01:50

## 2018-12-13 ENCOUNTER — Other Ambulatory Visit: Payer: Self-pay

## 2018-12-13 ENCOUNTER — Encounter (HOSPITAL_BASED_OUTPATIENT_CLINIC_OR_DEPARTMENT_OTHER): Payer: Medicare Other | Admitting: Physician Assistant

## 2018-12-13 DIAGNOSIS — R260 Ataxic gait: Secondary | ICD-10-CM | POA: Diagnosis not present

## 2018-12-13 DIAGNOSIS — L859 Epidermal thickening, unspecified: Secondary | ICD-10-CM | POA: Diagnosis not present

## 2018-12-13 DIAGNOSIS — L97429 Non-pressure chronic ulcer of left heel and midfoot with unspecified severity: Secondary | ICD-10-CM | POA: Diagnosis not present

## 2018-12-13 DIAGNOSIS — J449 Chronic obstructive pulmonary disease, unspecified: Secondary | ICD-10-CM | POA: Diagnosis not present

## 2018-12-13 DIAGNOSIS — R7303 Prediabetes: Secondary | ICD-10-CM | POA: Diagnosis not present

## 2018-12-13 DIAGNOSIS — G9009 Other idiopathic peripheral autonomic neuropathy: Secondary | ICD-10-CM | POA: Diagnosis not present

## 2018-12-13 DIAGNOSIS — L97422 Non-pressure chronic ulcer of left heel and midfoot with fat layer exposed: Secondary | ICD-10-CM | POA: Diagnosis not present

## 2018-12-13 DIAGNOSIS — I2510 Atherosclerotic heart disease of native coronary artery without angina pectoris: Secondary | ICD-10-CM | POA: Diagnosis not present

## 2018-12-20 ENCOUNTER — Other Ambulatory Visit: Payer: Self-pay

## 2018-12-20 ENCOUNTER — Encounter (HOSPITAL_BASED_OUTPATIENT_CLINIC_OR_DEPARTMENT_OTHER): Payer: Medicare Other | Admitting: Internal Medicine

## 2018-12-20 DIAGNOSIS — R260 Ataxic gait: Secondary | ICD-10-CM | POA: Diagnosis not present

## 2018-12-20 DIAGNOSIS — L97422 Non-pressure chronic ulcer of left heel and midfoot with fat layer exposed: Secondary | ICD-10-CM | POA: Diagnosis not present

## 2018-12-20 DIAGNOSIS — L97429 Non-pressure chronic ulcer of left heel and midfoot with unspecified severity: Secondary | ICD-10-CM | POA: Diagnosis not present

## 2018-12-20 DIAGNOSIS — I2510 Atherosclerotic heart disease of native coronary artery without angina pectoris: Secondary | ICD-10-CM | POA: Diagnosis not present

## 2018-12-20 DIAGNOSIS — J449 Chronic obstructive pulmonary disease, unspecified: Secondary | ICD-10-CM | POA: Diagnosis not present

## 2018-12-20 DIAGNOSIS — G9009 Other idiopathic peripheral autonomic neuropathy: Secondary | ICD-10-CM | POA: Diagnosis not present

## 2018-12-20 DIAGNOSIS — R7303 Prediabetes: Secondary | ICD-10-CM | POA: Diagnosis not present

## 2018-12-20 NOTE — Progress Notes (Signed)
John Griffin, John Griffin ([id number]) Visit Report for 12/20/2018 HPI Details Patient Name: Date of Service: John Griffin, John Griffin 12/20/2018 8:15 AM Medical Record Number:[id number] Patient Account Number: [id number] Date of Birth/Sex: Treating RN: 1950-12-08 (68 y.o. M) Primary Care Provider: Christain Sacramento Other Clinician: Referring Provider: Treating Provider/Extender:, Glendale Chard, Letta Moynahan in Treatment: 24 History of Present Illness HPI Description: ADMISSION 11/19/2017 This is a 68 year old man who is not a diabetic. He tells me that 6 months ago he noticed a callused area on his left heel that open into a wound. This is on the plantar aspect of his heel just near the tip of his calcaneus. He says that this is open and closed repetitively over the last 6 months. He recently saw his primary doctor who sent him here. He is been using Neosporin and a Band-Aid. He tells me that he follows with cardiology and he had a hemoglobin A1c but he does not know what it is but he has been told he is not a diabetic. Measurement on the wound was 0.2 x 1.5 with some undermining distally in this oval-shaped horizontally orientated wound. He does not feel any pain He tells me that for an indefinite period of time he has not had much feeling in his feet. He has not had a medical work-up for this. Our intake nurses noted reduced microfilament sensation The patient has a history of COPD, hypercholesterolemia, coronary artery disease, gastroesophageal reflux disease, prostate cancer treated with surgery, obstructive sleep apnea. He tells [place] that he twisted his back last week and he has a back brace he is also using a cane ABI in our clinic was 1.17 on the left 11/26/2017; the patient arrives with his wound looking a lot better. He has been offloading this using a heel offloading boot. Dimensions are a lot better. 12/03/2017; the area on the tip of his heel extending slightly into the plantar surface has no  open area. Using a 5 curette and then a #10 scalpel I removed some callus but there is no open wound. I think this is going to be an offloading issue in a patient who has nondiabetic neuropathy. For now we are going to recommend a heel cup type heel protector and see if this will maintain skin integrity in this area. READMISSION 07/05/2018 Patient returns to clinic with a reopening of the left heel about a month and a half ago. He was in this clinic in the fall with a wound and roughly the same area. He has some form of idiopathic neuropathy not listed as a diabetic. Very ataxic wound. He also has leg length discrepancy secondary to a previous accident he had and according to his wife a lot of the pressure when he takes a step lands on the left heel. They have been using Neosporin more recently some leftover silver collagen. They did go into a heel offloading boot that we gave him last time. Past medical history as noted he is not a diabetic, he has idiopathic peripheral neuropathy, hypertension, coronary artery disease, obstructive sleep apnea on CPAP, prostate CA COPD. He is a heavy smoker ABI in our clinic was 1.05 on the left 6/22; x-ray I did last time showed no evidence of osteomyelitis. A wound culture showed Alcaligenes faecalis. I am not well familiar with this organism although I will try to look it up in more detail. In any case the wound is somewhat worse today with a small probing area off the base of the wound. Also problematic  is that he does not seem to wear the heel offloading boot properly. We have emphasized that his foot has to be all the way forward, otherwise the heel will be on the floor. 6/29; the patient is completing his antibiotics. Depth of this is come in somewhat. He has been to see his primary doctor and diagnosed with prediabetes now on metformin 500 twice daily. 1 would wonder about the etiology of his neuropathy. They are making a better effort to use the heel  offloading boot properly. He has significant gait ataxia I would be concerned about a total contact cast 7/6- Completed cipro, he continues to use the offloading shoe, he does have balance issues with right leg being shorter, has been changing dressing on heel every other day 7/13; he is using his offloading shoe. Some improvement in length and width and depth. However this is a deep punched out wound on the left plantar heel. His right leg is shorter and his balance is ataxic secondary to a neuropathy. He is prediabetic on metformin. It would be difficult to see him in a total contact cast although he assures me along with his wife that they were doing as much pressure relief short of this they can 7/20; he is still in his heel offloading shoe. Absolutely no difference this week. Thick callus skin and subcutaneous tissue around the deep punched out wound. Required an extensive debridement. We have been using Prisma 7/27; he is still on his heel offloading shoe and according to the patient and his wife he is religiously offloading this. He has a little less depth to the wound certainly no change in the surface area. He continues to have thick callus skin and subcutaneous tissue around the wound edge which I continuously have to debride including today. If we can get this to a more superficial area that would be success. He is using silver collagen. He is not a candidate for a total contact cast secondary to gait and balance issues 08/23/18-Patient returns at 69 weeks for his left heel wound. This appears to be slightly bigger in dimension. Less necrotic tissue than before. Continue to use Prisma. 8/10-Patient returns at 69 week to continue care for his left heel wound, this is looking better than before, definitely less necrotic tissue, continue to use Prisma 8/17; left plantar heel. Once again rolled up areas of skin and subcutaneous tissue around the wound. Wound surface is not too bad. We have  been using Prisma 8/24; left plantar heel. Absolutely no change to rolled up skin and subcutaneous tissue around the wound removed with a #5 curette we have been using silver collagen for a long period of time without any improvement although I think this is largely a pressure issue. I have changed him to [facility] today. Again we have talked about a total contact cast although this would add to his already left greater than right leg length discrepancy about an inch and a half according to the patient. 8/31; left plantar heel. Absolutely no change. Circular punched out wound with callus thick subcutaneous tissue around the wound margin. We have been using Hydrofera Blue 9/21; left plantar heel although the surface area of this is not changed the depth seems somewhat better. There is also not as much callus and thick subcutaneous tissue. We have been using Hydrofera Blue. He came in this day with a support on the right foot which is his shorter leg. We are using a heel offloading boot on the left.  9/28 left plantar heel. Using Hydrofera Blue. We have improvement in measurements but this is still a relatively punched-out area with evidence of excessive pressure 10/5; left plantar heel using Hydrofera Blue surface area measurements are not different however there is certainly less callus less rolled edges around the wound. No debridement today. I am continuing Hydrofera Blue for another week. There is no further option for offloading other than the heel offloading boot that he currently uses 10/12; no different from last week using Hydrofera Blue. Still requiring debridement. I believe this is an offloading issue 10/19; we switch to endoform last week. Surface area is improved and the wound certainly looks smaller from that surface area point of view but the depth is about the same. Extensive discussion today about a total contact cast. His wife is present. She expressed both of their desire  to get this wound healed and they want to try the cast. We went over risks including falls, fractures, problems with people with restless legs etc. etc. the bottom line is that we agreed to try this. He will be back on Thursday for the obligatory total contact cast change 10/22; back for the obligatory total contact cast change. He had an injury and a new open area at the top of the cast over the anterior tibia. This is superficial. Apparently this happened the next day after we put it on but he would not allow his wife to call and tell us. He cut out the area of the cast. Fortunately the wound that we have been following him for looks better. This is on the left heel more superficial healthier looking granulation. Using silver collagen 10/26; the wound on his left heel looks smaller in terms of surface area but the depth is still the same. This has come in fairly well however. The cast injury on the anterior tibia is about the same. We are using collagen on the heel silver alginate on the cast injury site on the left mid tibia area. He tells me he was up on his feet yesterday quite a bit because of car problems 11/2; left heel wound perhaps some improvement in depth and less callus however not as dramatic changes I was hoping to see. The cast injury on the mid anterior tibia looks better. Using silver alginate 11/9 left heel wound is improved. Dimensions are smaller. There is still some depth with some undermining. Less callus and thick subcutaneous tissue around the wound than I am used to seeing. This reflects improvement with offloading in a total contact cast 11/16; continued improvement in the left heel. Dimensions are smaller he arrived in clinic with eschar callus and dry skin over the wound. 12/13/2018 on evaluation today patient appears to be doing well with regard to his plantar foot ulcer on the heel. He has been tolerating the total contact cast without complication. Fortunately  there is no signs of infection. He did have some dry skin/callus surrounding the wound opening itself. This could counter be pulled back and it was there was some moisture underneath. With that being said there did not appear to be any open wound at this point he seems to be doing quite well. 11/30; the patient appears to have been closed over last week but it was put in a precautionary total contact cast. He remains closed although there is some dry skin and callus around the wound on the plantar heel. There is no opening no debridement was necessary. The patient has purchased a new  pair of running shoes with a surprisingly soft sole. He also has some insoles. We are going to transition him to this. Hopefully I will hold this area. I emphasized that he is also going to have to use these in the home as well Electronic Signature(s) Signed: 12/20/2018 6:46:53 PM By: Linton Ham MD Entered By: Linton Ham on 12/20/2018 11:14:28 -------------------------------------------------------------------------------- Physical Exam Details Patient Name: Date of Service: John Griffin, John Griffin 12/20/2018 8:15 AM Medical Record [id number] Patient Account Number: [id number] Date of Birth/Sex: Treating RN: [birth year] (68 y.o. M) Primary Care Provider: Christain [place] Other Clinician: Referring Provider: Treating Provider/Extender:, Glendale Chard, Letta Moynahan in Treatment: 24 Constitutional Patient is hypertensive.. Pulse regular and within target range for patient.Marland Kitchen Respirations regular, non-labored and within target range.. Temperature is normal and within the target range for the patient.. Notes Wound exam; this did require an extensive period of time in a total contact cast but it is closed. There is some dry thick callus and skin around the wound area however clearly this is not open currently. Electronic Signature(s) Signed: 12/20/2018 6:46:53 PM By: Linton Ham MD Entered By:  Linton Ham on 12/20/2018 11:15:20 -------------------------------------------------------------------------------- Physician Orders Details Patient Name: Date of Service: John Griffin, John Griffin 12/20/2018 8:15 AM Medical Record Number:[id number] Patient Account Number: [id number] Date of Birth/Sex: Treating RN: 1950-08-05 (68 y.o. [name] Primary Care Provider: Christain Sacramento Other Clinician: Referring Provider: Treating Provider/Extender:, Glendale Chard, Letta Moynahan in Treatment: 24 Verbal / Phone Orders: No Diagnosis Coding ICD-10 Coding Code Description [phone] Non-pressure chronic ulcer of left heel and midfoot with fat layer exposed G90.09 Other idiopathic peripheral autonomic neuropathy R73.03 Prediabetes Discharge From [facility] Discharge from [facility] Other: - Keep left heel padded for protection - especially when you are wearing you shoes Electronic Signature(s) Signed: 12/20/2018 6:27:46 PM By: Levan Hurst RN, BSN Signed: 12/20/2018 6:46:53 PM By: Linton Ham MD Entered By: Levan Hurst on 12/20/2018 08:43:48 -------------------------------------------------------------------------------- Problem List Details Patient Name: Date of Service: John Griffin, John Griffin 12/20/2018 8:15 AM Medical Record [id number] Patient Account Number: [id number] Date of Birth/Sex: Treating RN: Jul 25, 1950 (68 y.o. [name] Primary Care Provider: Christain [place] Other Clinician: Referring Provider: Treating Provider/Extender:, Glendale Chard, Jama Flavors Weeks in Treatment: 24 Active Problems ICD-10 Evaluated Encounter Code Description Active Date Today Diagnosis L97.422 Non-pressure chronic ulcer of left heel and midfoot 07/05/2018 No Yes with fat layer exposed G90.09 Other idiopathic peripheral autonomic neuropathy 07/05/2018 No Yes R73.03 Prediabetes 07/19/2018 No Yes Inactive Problems ICD-10 Code Description  Active Date Inactive Date S80.12XD Contusion of left lower leg, subsequent encounter 11/11/2018 11/11/2018 Resolved Problems Electronic Signature(s) Signed: 12/20/2018 6:46:53 PM By: Linton Ham MD Entered By: Linton Ham on 12/20/2018 11:13:19 -------------------------------------------------------------------------------- Progress Note Details Patient Name: Date of Service: John Griffin, John Griffin 12/20/2018 8:15 AM Medical Record [id number] Patient Account Number: [id number] Date of Birth/Sex: Treating RN: 1950-03-31 (68 y.o. M) Primary Care Provider: Christain [place] Other Clinician: Referring Provider: Treating Provider/Extender:, Glendale Chard, Letta Moynahan in Treatment: 24 Subjective History of Present Illness (HPI) ADMISSION 11/19/2017 This is a 68 year old man who is not a diabetic. He tells me that 6 months ago he noticed a callused area on his left heel that open into a wound. This is on the plantar aspect of his heel just near the tip of his calcaneus. He says that this is open and closed repetitively over the last 6 months. He recently saw his primary doctor who sent him here. He is  been using Neosporin and a Band-Aid. He tells me that he follows with cardiology and he had a hemoglobin A1c but he does not know what it is but he has been told he is not a diabetic. Measurement on the wound was 0.2 x 1.5 with some undermining distally in this oval-shaped horizontally orientated wound. He does not feel any pain He tells me that for an indefinite period of time he has not had much feeling in his feet. He has not had a medical work-up for this. Our intake nurses noted reduced microfilament sensation The patient has a history of COPD, hypercholesterolemia, coronary artery disease, gastroesophageal reflux disease, prostate cancer treated with surgery, obstructive sleep apnea. He tells [place] that he twisted his back last week and he has a back brace he is also using a  cane ABI in our clinic was 1.17 on the left 11/26/2017; the patient arrives with his wound looking a lot better. He has been offloading this using a heel offloading boot. Dimensions are a lot better. 12/03/2017; the area on the tip of his heel extending slightly into the plantar surface has no open area. Using a 5 curette and then a #10 scalpel I removed some callus but there is no open wound. I think this is going to be an offloading issue in a patient who has nondiabetic neuropathy. For now we are going to recommend a heel cup type heel protector and see if this will maintain skin integrity in this area. READMISSION 07/05/2018 Patient returns to clinic with a reopening of the left heel about a month and a half ago. He was in this clinic in the fall with a wound and roughly the same area. He has some form of idiopathic neuropathy not listed as a diabetic. Very ataxic wound. He also has leg length discrepancy secondary to a previous accident he had and according to his wife a lot of the pressure when he takes a step lands on the left heel. They have been using Neosporin more recently some leftover silver collagen. They did go into a heel offloading boot that we gave him last time. Past medical history as noted he is not a diabetic, he has idiopathic peripheral neuropathy, hypertension, coronary artery disease, obstructive sleep apnea on CPAP, prostate CA COPD. He is a heavy smoker ABI in our clinic was 1.05 on the left 6/22; x-ray I did last time showed no evidence of osteomyelitis. A wound culture showed Alcaligenes faecalis. I am not well familiar with this organism although I will try to look it up in more detail. In any case the wound is somewhat worse today with a small probing area off the base of the wound. Also problematic is that he does not seem to wear the heel offloading boot properly. We have emphasized that his foot has to be all the way forward, otherwise the heel will be on the  floor. 6/29; the patient is completing his antibiotics. Depth of this is come in somewhat. He has been to see his primary doctor and diagnosed with prediabetes now on metformin 500 twice daily. 1 would wonder about the etiology of his neuropathy. They are making a better effort to use the heel offloading boot properly. He has significant gait ataxia I would be concerned about a total contact cast 7/6- Completed cipro, he continues to use the offloading shoe, he does have balance issues with right leg being shorter, has been changing dressing on heel every other day 7/13; he is using  his offloading shoe. Some improvement in length and width and depth. However this is a deep punched out wound on the left plantar heel. His right leg is shorter and his balance is ataxic secondary to a neuropathy. He is prediabetic on metformin. It would be difficult to see him in a total contact cast although he assures me along with his wife that they were doing as much pressure relief short of this they can 7/20; he is still in his heel offloading shoe. Absolutely no difference this week. Thick callus skin and subcutaneous tissue around the deep punched out wound. Required an extensive debridement. We have been using Prisma 7/27; he is still on his heel offloading shoe and according to the patient and his wife he is religiously offloading this. He has a little less depth to the wound certainly no change in the surface area. He continues to have thick callus skin and subcutaneous tissue around the wound edge which I continuously have to debride including today. If we can get this to a more superficial area that would be success. He is using silver collagen. He is not a candidate for a total contact cast secondary to gait and balance issues 08/23/18-Patient returns at 69 weeks for his left heel wound. This appears to be slightly bigger in dimension. Less necrotic tissue than before. Continue to use Prisma. 8/10-Patient  returns at 1 week to continue care for his left heel wound, this is looking better than before, definitely less necrotic tissue, continue to use Prisma 8/17; left plantar heel. Once again rolled up areas of skin and subcutaneous tissue around the wound. Wound surface is not too bad. We have been using Prisma 8/24; left plantar heel. Absolutely no change to rolled up skin and subcutaneous tissue around the wound removed with a #5 curette we have been using silver collagen for a long period of time without any improvement although I think this is largely a pressure issue. I have changed him to [facility] today. Again we have talked about a total contact cast although this would add to his already left greater than right leg length discrepancy about an inch and a half according to the patient. 8/31; left plantar heel. Absolutely no change. Circular punched out wound with callus thick subcutaneous tissue around the wound margin. We have been using Hydrofera Blue 9/21; left plantar heel although the surface area of this is not changed the depth seems somewhat better. There is also not as much callus and thick subcutaneous tissue. We have been using Hydrofera Blue. He came in this day with a support on the right foot which is his shorter leg. We are using a heel offloading boot on the left. 9/28 left plantar heel. Using Hydrofera Blue. We have improvement in measurements but this is still a relatively punched-out area with evidence of excessive pressure 10/5; left plantar heel using Hydrofera Blue surface area measurements are not different however there is certainly less callus less rolled edges around the wound. No debridement today. I am continuing Hydrofera Blue for another week. There is no further option for offloading other than the heel offloading boot that he currently uses 10/12; no different from last week using Hydrofera Blue. Still requiring debridement. I believe this is an  offloading issue 10/19; we switch to endoform last week. Surface area is improved and the wound certainly looks smaller from that surface area point of view but the depth is about the same. Extensive discussion today about a total contact  cast. His wife is present. She expressed both of their desire to get this wound healed and they want to try the cast. We went over risks including falls, fractures, problems with people with restless legs etc. etc. the bottom line is that we agreed to try this. He will be back on Thursday for the obligatory total contact cast change 10/22; back for the obligatory total contact cast change. He had an injury and a new open area at the top of the cast over the anterior tibia. This is superficial. Apparently this happened the next day after we put it on but he would not allow his wife to call and tell us. He cut out the area of the cast. Fortunately the wound that we have been following him for looks better. This is on the left heel more superficial healthier looking granulation. Using silver collagen 10/26; the wound on his left heel looks smaller in terms of surface area but the depth is still the same. This has come in fairly well however. The cast injury on the anterior tibia is about the same. We are using collagen on the heel silver alginate on the cast injury site on the left mid tibia area. He tells me he was up on his feet yesterday quite a bit because of car problems 11/2; left heel wound perhaps some improvement in depth and less callus however not as dramatic changes I was hoping to see. The cast injury on the mid anterior tibia looks better. Using silver alginate 11/9 left heel wound is improved. Dimensions are smaller. There is still some depth with some undermining. Less callus and thick subcutaneous tissue around the wound than I am used to seeing. This reflects improvement with offloading in a total contact cast 11/16; continued improvement in the  left heel. Dimensions are smaller he arrived in clinic with eschar callus and dry skin over the wound. 12/13/2018 on evaluation today patient appears to be doing well with regard to his plantar foot ulcer on the heel. He has been tolerating the total contact cast without complication. Fortunately there is no signs of infection. He did have some dry skin/callus surrounding the wound opening itself. This could counter be pulled back and it was there was some moisture underneath. With that being said there did not appear to be any open wound at this point he seems to be doing quite well. 11/30; the patient appears to have been closed over last week but it was put in a precautionary total contact cast. He remains closed although there is some dry skin and callus around the wound on the plantar heel. There is no opening no debridement was necessary. The patient has purchased a new pair of running shoes with a surprisingly soft sole. He also has some insoles. We are going to transition him to this. Hopefully I will hold this area. I emphasized that he is also going to have to use these in the home as well Objective Constitutional Patient is hypertensive.. Pulse regular and within target range for patient.Marland Kitchen Respirations regular, non-labored and within target range.. Temperature is normal and within the target range for the patient.. Vitals Time Taken: 8:27 AM, Height: 76 in, Weight: 335 lbs, BMI: 40.8, Temperature: 98.3 F, Pulse: 92 bpm, Respiratory Rate: 18 breaths/min, Blood Pressure: 146/87 mmHg. General Notes: Wound exam; this did require an extensive period of time in a total contact cast but it is closed. There is some dry thick callus and skin around the wound area  however clearly this is not open currently. Integumentary (Hair, Skin) Wound #2 status is Open. Original cause of wound was Gradually Appeared. The wound is located on the Left Calcaneus. The wound measures 0cm length x 0cm width x  0cm depth; 0cm^2 area and 0cm^3 volume. There is no tunneling or undermining noted. There is a none present amount of drainage noted. The wound margin is thickened. There is no granulation within the wound bed. There is no necrotic tissue within the wound bed. Assessment Active Problems ICD-10 Non-pressure chronic ulcer of left heel and midfoot with fat layer exposed Other idiopathic peripheral autonomic neuropathy Prediabetes Plan Discharge From [facility] Services: Discharge from [place] Primary Wound Dressing: Other: - Keep left heel padded for protection - especially when you are wearing you shoes 1. The patient can be discharged to his own new shoes with Dr. Felicie Morn insoles. Electronic Signature(s) Signed: 12/20/2018 6:46:53 PM By: Linton Ham MD Entered By: Linton Ham on 12/20/2018 11:16:01 -------------------------------------------------------------------------------- SuperBill Details Patient Name: Date of Service: John Griffin, John Griffin 12/20/2018 Medical Record [id number] Patient Account Number: [id number] Date of Birth/Sex: Treating RN: 1950-03-15 (68 y.o. [name] Primary Care Provider: Christain [place] Other Clinician: Referring Provider: Treating Provider/Extender:, Glendale Chard, Letta Moynahan in Treatment: 24 Diagnosis Coding ICD-10 Codes Code Description [phone] Non-pressure chronic ulcer of left heel and midfoot with fat layer exposed G90.09 Other idiopathic peripheral autonomic neuropathy R73.03 Prediabetes Facility Procedures The patient participates with Medicare or their insurance follows the Medicare Facility Guidelines: CPT4 Code Description Modifier Quantity [id number] [place] VISIT-LEV 3 EST PT 1 Physician Procedures CPT4 Code Description: [id number] - WC PHYS LEVEL 2 - EST PT ICD-10 Diagnosis Description L97.422 Non-pressure chronic ulcer of left heel and midfoot with G90.09 Other idiopathic peripheral autonomic  neuropathy Modifier: fat layer exp Quantity: 1 osed Electronic Signature(s) Signed: 12/20/2018 6:46:53 PM By: Linton Ham MD Entered By: Linton Ham on 12/20/2018 11:16:20

## 2018-12-21 NOTE — Progress Notes (Signed)
TRENNIS, JUDE ([id number]) Visit Report for 12/20/2018 Arrival Information Details Patient Name: Date of Service: John Griffin, John Griffin 12/20/2018 8:15 AM Medical Record Number:[id number] Patient Account Number: [id number] Date of Birth/Sex: Treating RN: [birth year] (68 y.o. Marvis Repress Primary Care Nabeeha Badertscher: Christain [place] Other Clinician: Referring Johnston Maddocks: Treating Zebadiah Willert/Extender:Robson, Glendale Chard, Letta Moynahan in Treatment: 24 Visit Information History Since Last Visit Added or deleted any medications: No Patient Arrived: Ambulatory Any new allergies or adverse reactions: No Arrival Time: 08:27 Had a fall or experienced change in No Accompanied By: self activities of daily living that may affect Transfer Assistance: None risk of falls: Patient Identification Verified: Yes Signs or symptoms of abuse/neglect since No Secondary Verification Process Yes last visito Completed: Hospitalized since last visit: No Patient Requires Transmission-Based No Implantable device outside of the clinic No Precautions: excluding Patient Has Alerts: No cellular tissue based products placed in the center since last visit: Has Dressing in Place as Prescribed: Yes Has Footwear/Offloading in Place as Yes Prescribed: Left: Total Contact Cast Pain Present Now: No Electronic Signature(s) Signed: 12/21/2018 7:10:15 AM By: Kela Millin Entered By: Kela Millin on 12/20/2018 08:27:51 -------------------------------------------------------------------------------- Clinic Level of Care Assessment Details Patient Name: Date of Service: John Griffin, John Griffin 12/20/2018 8:15 AM Medical Record Number:[id number] Patient Account Number: [id number] Date of Birth/Sex: Treating RN: [birth year] (68 y.o. [name] Primary Care Wylee Ogden: Christain [place] Other Clinician: Referring Delinda Malan: Treating Fredric Slabach/Extender:Robson, Glendale Chard, Letta Moynahan in Treatment: 24 Clinic Level  of Care Assessment Items TOOL 4 Quantity Score X - Use when only an EandM is performed on FOLLOW-UP visit 1 0 ASSESSMENTS - Nursing Assessment / Reassessment X - Reassessment of Co-morbidities (includes updates in patient status) 1 10 X - Reassessment of Adherence to Treatment Plan 1 5 ASSESSMENTS - Wound and Skin Assessment / Reassessment X - Simple Wound Assessment / Reassessment - one wound 1 5 []  - Complex Wound Assessment / Reassessment - multiple wounds 0 []  - Dermatologic / Skin Assessment (not related to wound area) 0 ASSESSMENTS - Focused Assessment []  - Circumferential Edema Measurements - multi extremities 0 []  - Nutritional Assessment / Counseling / Intervention 0 X - Lower Extremity Assessment (monofilament, tuning fork, pulses) 1 5 []  - Peripheral Arterial Disease Assessment (using hand held doppler) 0 ASSESSMENTS - Ostomy and/or Continence Assessment and Care []  - Incontinence Assessment and Management 0 []  - Ostomy Care Assessment and Management (repouching, etc.) 0 PROCESS - Coordination of Care X - Simple Patient / Family Education for ongoing care 1 15 []  - Complex (extensive) Patient / Family Education for ongoing care 0 X - Staff obtains Programmer, systems, Records, Test Results / Process Orders 1 10 []  - Staff telephones HHA, Nursing Homes / Clarify orders / etc 0 []  - Routine Transfer to another Facility (non-emergent condition) 0 []  - Routine Hospital Admission (non-emergent condition) 0 []  - New Admissions / Biomedical engineer / Ordering NPWT, Apligraf, etc. 0 []  - Emergency Hospital Admission (emergent condition) 0 X - Simple Discharge Coordination 1 10 []  - Complex (extensive) Discharge Coordination 0 PROCESS - Special Needs []  - Pediatric / Minor Patient Management 0 []  - Isolation Patient Management 0 []  - Hearing / Language / Visual special needs 0 []  - Assessment of Community assistance (transportation, D/C planning, etc.) 0 []  - Additional assistance /  Altered mentation 0 []  - Support Surface(s) Assessment (bed, cushion, seat, etc.) 0 INTERVENTIONS - Wound Cleansing / Measurement X - Simple Wound Cleansing - one wound 1 5 []  -  Complex Wound Cleansing - multiple wounds 0 X - Wound Imaging (photographs - any number of wounds) 1 5 []  - Wound Tracing (instead of photographs) 0 X - Simple Wound Measurement - one wound 1 5 []  - Complex Wound Measurement - multiple wounds 0 INTERVENTIONS - Wound Dressings []  - Small Wound Dressing one or multiple wounds 0 []  - Medium Wound Dressing one or multiple wounds 0 []  - Large Wound Dressing one or multiple wounds 0 []  - Application of Medications - topical 0 []  - Application of Medications - injection 0 INTERVENTIONS - Miscellaneous []  - External ear exam 0 []  - Specimen Collection (cultures, biopsies, blood, body fluids, etc.) 0 []  - Specimen(s) / Culture(s) sent or taken to Lab for analysis 0 []  - Patient Transfer (multiple staff / Civil Service fast streamer / Similar devices) 0 []  - Simple Staple / Suture removal (25 or less) 0 []  - Complex Staple / Suture removal (26 or more) 0 []  - Hypo / Hyperglycemic Management (close monitor of Blood Glucose) 0 []  - Ankle / Brachial Index (ABI) - do not check if billed separately 0 X - Vital Signs 1 5 Has the patient been seen at the hospital within the last three years: Yes Total Score: 80 Level Of Care: New/Established - Level 3 Electronic Signature(s) Signed: 12/20/2018 6:27:46 PM By: Levan Hurst RN, BSN Entered By: Levan Hurst on 12/20/2018 08:45:06 -------------------------------------------------------------------------------- Encounter Discharge Information Details Patient Name: Date of Service: John Griffin, John Griffin 12/20/2018 8:15 AM Medical Record [id number] Patient Account Number: [id number] Date of Birth/Sex: Treating RN: 07-21-1950 (68 y.o. [name] Primary Care Lanai Conlee: Christain [place] Other Clinician: Referring Ardean Simonich: Treating  Lunna Vogelgesang/Extender:Robson, Glendale Chard, Letta Moynahan in Treatment: 24 Encounter Discharge Information Items Discharge Condition: Stable Ambulatory Status: Ambulatory Discharge Destination: Home Transportation: Private Auto Accompanied By: self Schedule Follow-up Appointment: No Clinical Summary of Care: Notes applied offloading insole into new pair of shoes for patient. explained at length to pad the healed area and closely monitor area for the next 6 weeks. patient in agreement. Electronic Signature(s) Signed: 12/20/2018 6:19:57 PM By: Deon Pilling Entered By: Deon Pilling on 12/20/2018 09:14:05 -------------------------------------------------------------------------------- Lower Extremity Assessment Details Patient Name: Date of Service: John Griffin, John Griffin 12/20/2018 8:15 AM Medical Record Number:[id number] Patient Account Number: [id number] Date of Birth/Sex: Treating RN: 06-12-1950 (68 y.o. Marvis Repress Primary Care Jobanny Mavis: Christain [place] Other Clinician: Referring Brina Umeda: Treating Pietra Zuluaga/Extender:Robson, Glendale Chard, Jama Flavors Weeks in Treatment: 24 Edema Assessment Assessed: [Left: No] [Right: No] Edema: [Left: Yes] [Right: No] Calf Left: Right: Point of Measurement: 44 cm From Medial Instep 40 cm cm Ankle Left: Right: Point of Measurement: 11 cm From Medial Instep 23.5 cm cm Electronic Signature(s) Signed: 12/21/2018 7:10:15 AM By: Kela Millin Entered By: Kela Millin on 12/20/2018 08:34:01 -------------------------------------------------------------------------------- Multi Wound Chart Details Patient Name: Date of Service: John Griffin, John Griffin 12/20/2018 8:15 AM Medical Record [id number] Patient Account Number: [id number] Date of Birth/Sex: Treating RN: August 11, 1950 (68 y.o. M) Primary Care Raphael Espe: Christain [place] Other Clinician: Referring Johannes Everage: Treating Agnes Probert/Extender:Robson, Glendale Chard, Letta Moynahan in Treatment:  24 Vital Signs Height(in): 78 Pulse(bpm): 42 Weight(lbs): 335 Blood Pressure(mmHg): 146/87 Body Mass Index(BMI): 41 Temperature(F): 98.3 Respiratory 18 Rate(breaths/min): Photos: [2:No Photos] [N/A:N/A] Wound Location: [2:Left Calcaneus] [N/A:N/A] Wounding Event: [2:Gradually Appeared] [N/A:N/A] Primary Etiology: [2:Neuropathic Ulcer-Non Diabetic] [N/A:N/A] Comorbid History: [2:Chronic Obstructive Pulmonary Disease (COPD), Sleep Apnea, Arrhythmia, Coronary Artery Disease, Hypertension, Myocardial Infarction, Osteoarthritis] [N/A:N/A] Date Acquired: [2:01/20/2017] [N/A:N/A] Weeks of Treatment: [2:24] [N/A:N/A] Wound Status: [2:Open] [N/A:N/A] Measurements  L x W x D 0x0x0 [N/A:N/A] (cm) Area (cm) : [2:0] [N/A:N/A] Volume (cm) : [2:0] [N/A:N/A] % Reduction in Area: [2:100.00%] [N/A:N/A] % Reduction in Volume: 100.00% [N/A:N/A] Classification: [2:Full Thickness Without Exposed Support Structures] [N/A:N/A] Exudate Amount: [2:None Present] [N/A:N/A] Wound Margin: [2:Thickened] [N/A:N/A] Granulation Amount: [2:None Present (0%)] [N/A:N/A] Necrotic Amount: [2:None Present (0%)] [N/A:N/A] Exposed Structures: [2:Fascia: No Fat Layer (Subcutaneous Tissue) Exposed: No Tendon: No Muscle: No Joint: No Bone: No Large (67-100%)] [N/A:N/A N/A] Treatment Notes Electronic Signature(s) Signed: 12/20/2018 6:46:53 PM By: Linton Ham MD Entered By: Linton Ham on 12/20/2018 11:13:25 -------------------------------------------------------------------------------- Linton Hall Details Patient Name: Date of Service: John Griffin, John Griffin 12/20/2018 8:15 AM Medical Record Number:[id number] Patient Account Number: [id number] Date of Birth/Sex: Treating RN: [birth year] (68 y.o. [name] Primary Care Karrington Studnicka: Christain [place] Other Clinician: Referring Jadarion Halbig: Treating Saara Kijowski/Extender:Robson, Glendale Chard, Letta Moynahan in Treatment: 24 Active Inactive Electronic  Signature(s) Signed: 12/20/2018 6:27:46 PM By: Levan Hurst RN, BSN Entered By: Levan Hurst on 12/20/2018 08:44:09 -------------------------------------------------------------------------------- Pain Assessment Details Patient Name: Date of Service: John Griffin, John Griffin 12/20/2018 8:15 AM Medical Record Number:[id number] Patient Account Number: [id number] Date of Birth/Sex: Treating RN: 09-15-1950 (68 y.o. Marvis Repress Primary Care Makensie Mulhall: Christain [place] Other Clinician: Referring Syndey Jaskolski: Treating Brylyn Novakovich/Extender:Robson, Glendale Chard, Letta Moynahan in Treatment: 24 Active Problems Location of Pain Severity and Description of Pain Patient Has Paino No Site Locations Pain Management and Medication Current Pain Management: Electronic Signature(s) Signed: 12/21/2018 7:10:15 AM By: Kela Millin Entered By: Kela Millin on 12/20/2018 08:28:22 -------------------------------------------------------------------------------- Patient/Caregiver Education Details Patient Name: [name] 11/30/2020andnbsp8:15 Date of Service: AM Medical Record [id number] Number: Patient Account Number: [id number] Treating RN: Date of Birth/Gender: Nov 06, 1950 (68 y.o. [name]) Other Clinician: Primary Care Physician: Christain [place] Treating Linton Ham Referring Physician: Physician/Extender: Mindi Junker in Treatment: 24 Education Assessment Education Provided To: Patient Education Topics Provided Offloading: Methods: Explain/Verbal Responses: State content correctly Wound/Skin Impairment: Methods: Explain/Verbal Responses: State content correctly Electronic Signature(s) Signed: 12/20/2018 6:27:46 PM By: Levan Hurst RN, BSN Entered By: Levan Hurst on 12/20/2018 08:44:35 -------------------------------------------------------------------------------- Wound Assessment Details Patient Name: Date of Service: John Griffin, John Griffin 12/20/2018 8:15  AM Medical Record [id number] Patient Account Number: [id number] Date of Birth/Sex: Treating RN: 01-26-1950 (68 y.o. Marvis Repress Primary Care Chalsey Leeth: Christain [place] Other Clinician: Referring Inaaya Vellucci: Treating Coriann Brouhard/Extender:Robson, Glendale Chard, Jama Flavors Weeks in Treatment: 24 Wound Status Wound Number: 2 Primary Neuropathic Ulcer-Non Diabetic Etiology: Wound Location: Left Calcaneus Wound Open Wounding Event: Gradually Appeared Status: Date Acquired: 01/20/2017 Comorbid Chronic Obstructive Pulmonary Disease Weeks Of Treatment: 24 History: (COPD), Sleep Apnea, Arrhythmia, Coronary Clustered Wound: No Artery Disease, Hypertension, Myocardial Infarction, Osteoarthritis Wound Measurements Length: (cm) 0 % Reduct Width: (cm) 0 % Reduct Depth: (cm) 0 Epitheli Area: (cm) 0 Tunneli Volume: (cm) 0 Undermi Wound Description Classification: Full Thickness Without Exposed Support Foul Odo Structures Slough/F Wound Thickened Margin: Exudate None Present Amount: Wound Bed Granulation Amount: None Present (0%) Necrotic Amount: None Present (0%) Fascia E Fat Laye Tendon E Muscle E Joint Ex Bone Exp r After Cleansing: No ibrino No Exposed Structure xposed: No r (Subcutaneous Tissue) Exposed: No xposed: No xposed: No posed: No osed: No ion in Area: 100% ion in Volume: 100% alization: Large (67-100%) ng: No ning: No Electronic Signature(s) Signed: 12/21/2018 7:10:15 AM By: Kela Millin Entered By: Kela Millin on 12/20/2018 08:34:28 -------------------------------------------------------------------------------- Vitals Details Patient Name: Date of Service: John Griffin, John Griffin 12/20/2018 8:15 AM Medical Record [id number] Patient Account  Number: [id number] Date of Birth/Sex: Treating RN: November 07, 1950 (68 y.o. Marvis Repress Primary Care Kellee Sittner: Christain [place] Other Clinician: Referring Kotaro Buer: Treating  Danialle Dement/Extender:Robson, Glendale Chard, Letta Moynahan in Treatment: 24 Vital Signs Time Taken: 08:27 Temperature (F): 98.3 Height (in): 76 Pulse (bpm): 92 Weight (lbs): 335 Respiratory Rate (breaths/min): 18 Body Mass Index (BMI): 40.8 Blood Pressure (mmHg): 146/87 Reference Range: 80 - 120 mg / dl Electronic Signature(s) Signed: 12/21/2018 7:10:15 AM By: Kela Millin Entered By: Kela Millin on 12/20/2018 08:28:15

## 2018-12-28 NOTE — Progress Notes (Signed)
John Griffin, John Griffin ([id number]) Visit Report for 11/08/2018 Arrival Information Details Patient Name: Date of Service: John Griffin, John Griffin 11/08/2018 8:00 AM Medical Record Number:[id number] Patient Account Number: [id number] Date of Birth/Sex: Treating RN: [birth year] (67 y.o. Jerilynn Mages) Carlene Coria Primary Care Mikenzi Raysor: Christain [place] Other Clinician: Referring Paulo Keimig: Treating Laurance Heide/Extender:Robson, Glendale Chard, Letta Moynahan in Treatment: 18 Visit Information History Since Last Visit All ordered tests and consults were completed: No Patient Arrived: Ambulatory Added or deleted any medications: No Arrival Time: 08:09 Any new allergies or adverse reactions: No Accompanied By: wife Had a fall or experienced change in No Transfer Assistance: None activities of daily living that may affect Patient Identification Verified: Yes risk of falls: Secondary Verification Process Completed: Yes Signs or symptoms of abuse/neglect since last No Patient Requires Transmission-Based No visito Precautions: Hospitalized since last visit: No Patient Has Alerts: No Implantable device outside of the clinic excluding No cellular tissue based products placed in the center since last visit: Has Dressing in Place as Prescribed: Yes Pain Present Now: No Electronic Signature(s) Signed: 12/28/2018 3:01:15 PM By: Carlene Coria RN Entered By: Carlene Coria on 11/08/2018 08:10:22 -------------------------------------------------------------------------------- Lower Extremity Assessment Details Patient Name: Date of Service: John Griffin, John Griffin 11/08/2018 8:00 AM Medical Record Number:[id number] Patient Account Number: [id number] Date of Birth/Sex: Treating RN: February 03, 1950 (67 y.o. Jerilynn Mages) Carlene Coria Primary Care Claron Rosencrans: Christain [place] Other Clinician: Referring Kiffany Schelling: Treating Jaynee Winters/Extender:Robson, Glendale Chard, Jama Flavors Weeks in Treatment: 18 Edema Assessment Assessed: [Left: No] [Right: No] Edema: [Left:  Ye] [Right: s] Calf Left: Right: Point of Measurement: 44 cm From Medial Instep cm 40 cm Ankle Left: Right: Point of Measurement: 11 cm From Medial Instep cm 24 cm Electronic Signature(s) Signed: 12/28/2018 3:01:15 PM By: Carlene Coria RN Entered By: Carlene Coria on 11/08/2018 08:16:34 -------------------------------------------------------------------------------- Multi Wound Chart Details Patient Name: Date of Service: John Griffin, John Griffin 11/08/2018 8:00 AM Medical Record Number:[id number] Patient Account Number: [id number] Date of Birth/Sex: Treating RN: 08-01-1950 (68 y.o. [name] Primary Care Elizabelle Fite: Christain [place] Other Clinician: Referring Kateena Degroote: Treating Kyrel Leighton/Extender:Robson, Glendale Chard, Letta Moynahan in Treatment: 18 Vital Signs Height(in): 15 Pulse(bpm): 79 Weight(lbs): 335 Blood Pressure(mmHg): 123/66 Body Mass Index(BMI): 41 Temperature(F): 98.1 Respiratory 18 Rate(breaths/min): Photos: [2:No Photos] [N/A:N/A] Wound Location: [2:Left Calcaneus] [N/A:N/A] Wounding Event: [2:Gradually Appeared] [N/A:N/A] Primary Etiology: [2:Neuropathic Ulcer-Non Diabetic] [N/A:N/A] Comorbid History: [2:Chronic Obstructive Pulmonary Disease (COPD), Sleep Apnea, Arrhythmia, Coronary Artery Disease, Hypertension, Myocardial Infarction, Osteoarthritis] [N/A:N/A] Date Acquired: [2:01/20/2017] [N/A:N/A] Weeks of Treatment: [2:18] [N/A:N/A] Wound Status: [2:Open] [N/A:N/A] Measurements L x W x D 1x1x0.6 [N/A:N/A] (cm) Area (cm) : [2:0.785] [N/A:N/A] Volume (cm) : [2:0.471] [N/A:N/A] % Reduction in Area: [2:50.00%] [N/A:N/A] % Reduction in Volume: [2:80.00%] [N/A:N/A] Classification: [2:Full Thickness Without Exposed Support Structures] [N/A:N/A] Exudate Amount: [2:Medium] [N/A:N/A] Exudate Type: [2:Serosanguineous] [N/A:N/A] Exudate Color: [2:red, brown] [N/A:N/A] Wound Margin: [2:Thickened] [N/A:N/A] Granulation Amount: [2:Large (67-100%)]  [N/A:N/A] Granulation Quality: [2:Pink, Pale] [N/A:N/A] Necrotic Amount: [2:Small (1-33%)] [N/A:N/A] Exposed Structures: [2:Fat Layer (Subcutaneous N/A Tissue) Exposed: Yes Fascia: No Tendon: No Muscle: No Joint: No Bone: No] Epithelialization: [2:Small (1-33%)] [N/A:N/A] Debridement: [2:Debridement - Excisional N/A] Pre-procedure [2:08:31] [N/A:N/A] Verification/Time Out Taken: Tissue Debrided: [2:Callus, Subcutaneous] [N/A:N/A] Level: [2:Skin/Subcutaneous Tissue] [N/A:N/A] Debridement Area (sq cm):1 [N/A:N/A] Instrument: [2:Curette] [N/A:N/A] Bleeding: [2:Minimum] [N/A:N/A] Hemostasis Achieved: [2:Pressure] [N/A:N/A] Procedural Pain: [2:0] [N/A:N/A] Post Procedural Pain: [2:0] [N/A:N/A] Debridement Treatment Procedure was tolerated [N/A:N/A] Response: [2:well] Post Debridement [2:1x1x0.6] [N/A:N/A] Measurements L x W x D (cm) Post Debridement [2:0.471] [N/A:N/A] Volume: (cm) Procedures Performed: Debridement [2:Total Contact Cast] [N/A:N/A]  Treatment Notes Electronic Signature(s) Signed: 11/08/2018 5:54:03 PM By: Linton Ham MD Signed: 11/10/2018 6:50:57 PM By: Levan Hurst RN, BSN Entered By: Linton Ham on 11/08/2018 08:57:44 -------------------------------------------------------------------------------- Multi-Disciplinary Care Plan Details Patient Name: Date of Service: John Griffin, John Griffin 11/08/2018 8:00 AM Medical Record Number:[id number] Patient Account Number: [id number] Date of Birth/Sex: Treating RN: 02-11-1950 (68 y.o. [name] Primary Care Kwabena Strutz: Christain [place] Other Clinician: Referring Shenia Alan: Treating Jyoti Harju/Extender:Robson, Glendale Chard, Letta Moynahan in Treatment: 18 Active Inactive Wound/Skin Impairment Nursing Diagnoses: Impaired tissue integrity Knowledge deficit related to smoking impact on wound healing Knowledge deficit related to ulceration/compromised skin integrity Goals: Patient/caregiver will verbalize  understanding of skin care regimen Date Initiated: 07/05/2018 Target Resolution Date: 11/26/2018 Goal Status: Active Ulcer/skin breakdown will have a volume reduction of 30% by week 4 Date Initiated: 07/05/2018 Date Inactivated: 08/02/2018 Target Resolution Date: 08/06/2018 Goal Status: Met Ulcer/skin breakdown will have a volume reduction of 50% by week 8 Date Initiated: 08/02/2018 Date Inactivated: 08/30/2018 Target Resolution Date: 09/03/2018 Goal Status: Met Ulcer/skin breakdown will have a volume reduction of 80% by week 12 Date Initiated: 08/30/2018 Date Inactivated: 10/11/2018 Target Resolution Date: 10/01/2018 Unmet Reason: unable to Goal Status: Unmet offload adequately Interventions: Assess patient/caregiver ability to obtain necessary supplies Assess patient/caregiver ability to perform ulcer/skin care regimen upon admission and as needed Assess ulceration(s) every visit Provide education on smoking Provide education on ulcer and skin care Notes: Electronic Signature(s) Signed: 11/10/2018 6:50:57 PM By: Levan Hurst RN, BSN Entered By: Levan Hurst on 11/08/2018 08:11:52 -------------------------------------------------------------------------------- Pain Assessment Details Patient Name: Date of Service: John Griffin, John Griffin 11/08/2018 8:00 AM Medical Record Number:[id number] Patient Account Number: [id number] Date of Birth/Sex: Treating RN: 03/25/1950 (67 y.o. Oval Linsey Primary Care Tanav Orsak: Christain [place] Other Clinician: Referring Lillyanne Bradburn: Treating Shayonna Ocampo/Extender:Robson, Glendale Chard, Jama Flavors Weeks in Treatment: 18 Active Problems Location of Pain Severity and Description of Pain Patient Has Paino No Site Locations Pain Management and Medication Current Pain Management: Electronic Signature(s) Signed: 12/28/2018 3:01:15 PM By: Carlene Coria RN Entered By: Carlene Coria on 11/08/2018  08:11:38 -------------------------------------------------------------------------------- Patient/Caregiver Education Details Patient Name: John Griffin 10/19/2020andnbsp8:00 Date of Service: AM Medical Record [id number] Number: Patient Account Number: [id number] Treating RN: Date of Birth/Gender: 1950-11-22 (67 y.o. [name]) Other Clinician: Primary Care Physician: Christain [place] Treating Linton Ham Referring Physician: Physician/Extender: Mindi Junker in Treatment: 61 Education Assessment Education Provided To: Patient Education Topics Provided Wound/Skin Impairment: Methods: Explain/Verbal Responses: State content correctly [employer]) Signed: 11/10/2018 6:50:57 PM By: Levan Hurst RN, BSN Entered By: Levan Hurst on 11/08/2018 08:12:02 -------------------------------------------------------------------------------- Wound Assessment Details Patient Name: Date of Service: John Griffin, John Griffin 11/08/2018 8:00 AM Medical Record Number:[id number] Patient Account Number: [id number] Date of Birth/Sex: Treating RN: 03-30-1950 (67 y.o. Jerilynn Mages) Carlene Coria Primary Care Harvest Stanco: Christain [place] Other Clinician: Referring Murice Barbar: Treating Ronte Parker/Extender:Robson, Glendale Chard, Jama Flavors Weeks in Treatment: 18 Wound Status Wound Number: 2 Primary Neuropathic Ulcer-Non Diabetic Etiology: Wound Location: Left Calcaneus Wound Open Wounding Event: Gradually Appeared Status: Date Acquired: 01/20/2017 Comorbid Chronic Obstructive Pulmonary Disease Weeks Of Treatment: 18 History: (COPD), Sleep Apnea, Arrhythmia, Coronary Clustered Wound: No Artery Disease, Hypertension, Myocardial Infarction, Osteoarthritis Photos Wound Measurements Length: (cm) 1 % Reduct Width: (cm) 1 % Reduct Depth: (cm) 0.6 Epitheli Area: (cm) 0.785 Tunneli Volume: (cm) 0.471 Undermi Wound Description Classification: Full Thickness Without Exposed Support Foul  Odo Structures Slough/F Wound Thickened Margin: Exudate Medium Amount: Exudate Serosanguineous Type: Exudate red, brown Color: Wound Bed Granulation Amount: Large (67-100%)  Granulation Quality: Pink, Pale Fascia E Necrotic Amount: Small (1-33%) Fat Laye Necrotic Quality: Adherent Slough Tendon Expo Muscle Expo Joint Expos Bone Expose r After Cleansing: No ibrino Yes Exposed Structure xposed: No r (Subcutaneous Tissue) Exposed: Yes sed: No sed: No ed: No d: No ion in Area: 50% ion in Volume: 80% alization: Small (1-33%) ng: No ning: No Electronic Signature(s) Signed: 11/09/2018 4:20:24 PM By: Mikeal Hawthorne EMT/HBOT Signed: 12/28/2018 3:01:15 PM By: Carlene Coria RN Entered By: Mikeal Hawthorne on 11/09/2018 14:18:21 -------------------------------------------------------------------------------- [place] Details Patient Name: Date of Service: John Griffin, John Griffin 11/08/2018 8:00 AM Medical Record Number:[id number] Patient Account Number: [id number] Date of Birth/Sex: Treating RN: [birth year] (67 y.o. Jerilynn Mages) Dolores Lory, [place] Primary Care Chelly Dombeck: Christain [place] Other Clinician: Referring Pastor Sgro: Treating Ahmar Pickrell/Extender:Robson, Glendale Chard, Jama Flavors Weeks in Treatment: 18 Vital Signs Time Taken: 08:10 Temperature (F): 98.1 Height (in): 76 Pulse (bpm): 79 Weight (lbs): 335 Respiratory Rate (breaths/min): 18 Body Mass Index (BMI): 40.8 Blood Pressure (mmHg): 123/66 Reference Range: 80 - 120 mg / dl Electronic Signature(s) Signed: 12/28/2018 3:01:15 PM By: Carlene Coria RN Entered By: Carlene Coria on 11/08/2018 08:10:45

## 2018-12-28 NOTE — Progress Notes (Signed)
LATRAVION, GRAVES ([id number]) Visit Report for 12/13/2018 Arrival Information Details Patient Name: Date of Service: John Griffin, John Griffin 12/13/2018 8:00 AM Medical Record Number:[id number] Patient Account Number: [id number] Date of Birth/Sex: Treating RN: [birth year] (68 y.o. Jerilynn Mages) Carlene Coria Primary Care Feliz Lincoln: Christain [place] Other Clinician: Referring Jaquez Farrington: Treating Eddith Mentor/Extender:Stone III, Alexander Bergeron, Jama Flavors Weeks in Treatment: 23 Visit Information History Since Last Visit All ordered tests and consults were completed: No Patient Arrived: Ambulatory Added or deleted any medications: No Arrival Time: 07:58 Any new allergies or adverse reactions: No Accompanied By: self Had a fall or experienced change in No Transfer Assistance: None activities of daily living that may affect Patient Identification Verified: Yes risk of falls: Secondary Verification Process Yes Signs or symptoms of abuse/neglect since last No Completed: visito Patient Requires Transmission-Based No Hospitalized since last visit: No Precautions: Implantable device outside of the clinic excluding No Patient Has Alerts: No cellular tissue based products placed in the center since last visit: Has Dressing in Place as Prescribed: Yes Has Compression in Place as Prescribed: Yes Pain Present Now: No Electronic Signature(s) Signed: 12/28/2018 2:55:30 PM By: Carlene Coria RN Entered By: Carlene Coria on 12/13/2018 07:58:52 -------------------------------------------------------------------------------- Encounter Discharge Information Details Patient Name: Date of Service: John Griffin, John Griffin 12/13/2018 8:00 AM Medical Record Number:[id number] Patient Account Number: [id number] Date of Birth/Sex: Treating RN: 1950/04/19 (68 y.o. Ernestene Mention Primary Care Amilcar Reever: Christain [place] Other Clinician: Referring Christoher Drudge: Treating Tully Burgo/Extender:Stone III, Alexander Bergeron, Jama Flavors Weeks in Treatment: 23 Encounter  Discharge Information Items Discharge Condition: Stable Ambulatory Status: Ambulatory Discharge Destination: Home Transportation: Private Auto Accompanied By: self Schedule Follow-up Appointment: Yes Clinical Summary of Care: Patient Declined Electronic Signature(s) Signed: 12/14/2018 1:35:50 PM By: Baruch Gouty RN, BSN Entered By: Baruch Gouty on 12/13/2018 08:44:43 -------------------------------------------------------------------------------- Lower Extremity Assessment Details Patient Name: Date of Service: John Griffin, John Griffin 12/13/2018 8:00 AM Medical Record Number:[id number] Patient Account Number: [id number] Date of Birth/Sex: Treating RN: Apr 11, 1950 (68 y.o. Jerilynn Mages) Carlene Coria Primary Care Beva Remund: Christain [place] Other Clinician: Referring Almer Bushey: Treating Aseem Sessums/Extender:Stone III, Alexander Bergeron, Jama Flavors Weeks in Treatment: 23 Edema Assessment Assessed: [Left: No] [Right: No] Edema: [Left: Yes] [Right: No] Calf Left: Right: Point of Measurement: 44 cm From Medial Instep 40 cm cm Ankle Left: Right: Point of Measurement: 11 cm From Medial Instep 23 cm cm Electronic Signature(s) Signed: 12/28/2018 2:55:30 PM By: Carlene Coria RN Entered By: Carlene Coria on 12/13/2018 08:03:33 -------------------------------------------------------------------------------- Edna Details Patient Name: Date of Service: John Griffin, John Griffin 12/13/2018 8:00 AM Medical Record Number:[id number] Patient Account Number: [id number] Date of Birth/Sex: Treating RN: 1950-04-15 (68 y.o. [name] Primary Care Vernie Piet: Christain [place] Other Clinician: Referring Leialoha Hanna: Treating Chisom Aust/Extender:Stone III, Alexander Bergeron, Jama Flavors Weeks in Treatment: 23 Active Inactive Wound/Skin Impairment Nursing Diagnoses: Impaired tissue integrity Knowledge deficit related to smoking impact on wound healing Knowledge deficit related to ulceration/compromised skin  integrity Goals: Patient/caregiver will verbalize understanding of skin care regimen Date Initiated: 07/05/2018 Target Resolution Date: 12/24/2018 Goal Status: Active Ulcer/skin breakdown will have a volume reduction of 30% by week 4 Date Initiated: 07/05/2018 Date Inactivated: 08/02/2018 Target Resolution Date: 08/06/2018 Goal Status: Met Ulcer/skin breakdown will have a volume reduction of 50% by week 8 Date Initiated: 08/02/2018 Date Inactivated: 08/30/2018 Target Resolution Date: 09/03/2018 Goal Status: Met Ulcer/skin breakdown will have a volume reduction of 80% by week 12 Date Initiated: 08/30/2018 Date Inactivated: 10/11/2018 Target Resolution Date: 10/01/2018 Unmet Reason: unable to Goal Status: Unmet offload adequately Interventions: Assess patient/caregiver ability to  obtain necessary supplies Assess patient/caregiver ability to perform ulcer/skin care regimen upon admission and as needed Assess ulceration(s) every visit Provide education on smoking Provide education on ulcer and skin care Notes: Electronic Signature(s) Signed: 12/13/2018 2:18:43 PM By: Levan Hurst RN, BSN Entered By: Levan Hurst on 12/13/2018 08:33:28 -------------------------------------------------------------------------------- Pain Assessment Details Patient Name: Date of Service: John Griffin, John Griffin 12/13/2018 8:00 AM Medical Record Number:[id number] Patient Account Number: [id number] Date of Birth/Sex: Treating RN: [birth year] (68 y.o. Oval Linsey Primary Care Ariella Voit: Christain [place] Other Clinician: Referring Dorthy Hustead: Treating Ravinder Hofland/Extender:Stone III, Alexander Bergeron, Jama Flavors Weeks in Treatment: 23 Active Problems Location of Pain Severity and Description of Pain Patient Has Paino No Patient Has Paino No Site Locations Pain Management and Medication Current Pain Management: Electronic Signature(s) Signed: 12/28/2018 2:55:30 PM By: Carlene Coria RN Entered By: Carlene Coria on 12/13/2018  07:59:39 -------------------------------------------------------------------------------- Patient/Caregiver Education Details Patient Name: John Griffin 11/23/2020andnbsp8:00 Date of Service: AM Medical Record [id number] Number: Patient Account Number: [id number] Treating RN: Date of Birth/Gender: 25-Feb-1950 (68 y.o. [name]) Other Clinician: Primary Care Treating Lockie Mola Physician: Physician/Extender: Referring Physician: Mindi Junker in Treatment: 5 Education Assessment Education Provided To: Patient Education Topics Provided Offloading: Methods: Explain/Verbal Responses: State content correctly Wound/Skin Impairment: Methods: Explain/Verbal Responses: State content correctly Electronic Signature(s) Signed: 12/13/2018 2:18:43 PM By: Levan Hurst RN, BSN Entered By: Levan Hurst on 12/13/2018 08:15:46 -------------------------------------------------------------------------------- Wound Assessment Details Patient Name: Date of Service: John Griffin, John Griffin 12/13/2018 8:00 AM Medical Record Number:[id number] Patient Account Number: [id number] Date of Birth/Sex: Treating RN: 1951/01/04 (68 y.o. Jerilynn Mages) Carlene Coria Primary Care Ansley Mangiapane: Christain [place] Other Clinician: Referring Ociel Retherford: Treating Reyaansh Merlo/Extender:Stone III, Alexander Bergeron, Jama Flavors Weeks in Treatment: 23 Wound Status Wound Number: 2 Primary Neuropathic Ulcer-Non Diabetic Etiology: Wound Location: Left Calcaneus Wound Open Wounding Event: Gradually Appeared Status: Date Acquired: 01/20/2017 Comorbid Chronic Obstructive Pulmonary Disease Weeks Of Treatment: 23 History: (COPD), Sleep Apnea, Arrhythmia, Coronary Clustered Wound: No Artery Disease, Hypertension, Myocardial Infarction, Osteoarthritis Photos Wound Measurements Length: (cm) 0 % Reduct Width: (cm) 0 % Reduct Depth: (cm) 0 Epitheli Area: (cm) 0 Tunneli Volume: (cm) 0 Undermi Wound  Description Classification: Full Thickness Without Exposed Support Foul Odo Structures Slough/F Wound Thickened Margin: Exudate None Present Amount: Wound Bed Granulation Amount: None Present (0%) Necrotic Amount: None Present (0%) Fascia E Fat Laye Tendon E Muscle E Joint Expos Bone Expose r After Cleansing: No ibrino No Exposed Structure xposed: No r (Subcutaneous Tissue) Exposed: No xposed: No xposed: No ed: No d: No ion in Area: 100% ion in Volume: 100% alization: Large (67-100%) ng: No ning: No Electronic Signature(s) Signed: 12/14/2018 7:37:04 AM By: Mikeal Hawthorne EMT/HBOT Signed: 12/28/2018 2:55:30 PM By: Carlene Coria RN Previous Signature: 12/13/2018 2:18:43 PM Version By: Levan Hurst RN, BSN Entered By: Mikeal Hawthorne on 12/14/2018 07:22:19 -------------------------------------------------------------------------------- Vitals Details Patient Name: Date of Service: John Griffin, John Griffin 12/13/2018 8:00 AM Medical Record Number:[id number] Patient Account Number: [id number] Date of Birth/Sex: Treating RN: [birth year] (68 y.o. Jerilynn Mages) Dolores Lory, [place] Primary Care Josslin Sanjuan: Christain [place] Other Clinician: Referring Mahasin Riviere: Treating Loucinda Croy/Extender:Stone III, Alexander Bergeron, Jama Flavors Weeks in Treatment: 23 Vital Signs Time Taken: 07:59 Temperature (F): 98.2 Height (in): 76 Pulse (bpm): 83 Weight (lbs): 335 Respiratory Rate (breaths/min): 18 Body Mass Index (BMI): 40.8 Blood Pressure (mmHg): 126/81 Reference Range: 80 - 120 mg / dl Electronic Signature(s) Signed: 12/28/2018 2:55:30 PM By: Carlene Coria RN Entered By: Carlene Coria on 12/13/2018 07:59:31

## 2018-12-28 NOTE — Progress Notes (Signed)
APOLINAR, BERO ([id number]) Visit Report for 11/01/2018 Arrival Information Details Patient Name: Date of Service: ANANTH, FIALLOS 11/01/2018 8:00 AM Medical Record Number:[id number] Patient Account Number: [id number] Date of Birth/Sex: Treating RN: 1950/05/08 (67 y.o. Jerilynn Mages) Carlene Coria Primary Care Ammon Muscatello: Christain [place] Other Clinician: Referring Gerado Nabers: Treating Divina Neale/Extender:Robson, Glendale Chard, Letta Moynahan in Treatment: 17 Visit Information History Since Last Visit All ordered tests and consults were completed: No Patient Arrived: Ambulatory Added or deleted any medications: No Arrival Time: 07:56 Any new allergies or adverse reactions: No Accompanied By: self Had a fall or experienced change in No Transfer Assistance: None activities of daily living that may affect Patient Identification Verified: Yes risk of falls: Secondary Verification Process Completed: Yes Signs or symptoms of abuse/neglect since last No Patient Requires Transmission-Based No visito Precautions: Hospitalized since last visit: No Patient Has Alerts: No Implantable device outside of the clinic excluding No cellular tissue based products placed in the center since last visit: Has Dressing in Place as Prescribed: Yes Pain Present Now: No Electronic Signature(s) Signed: 12/28/2018 3:01:15 PM By: Carlene Coria RN Entered By: Carlene Coria on 11/01/2018 07:57:41 -------------------------------------------------------------------------------- Encounter Discharge Information Details Patient Name: Date of Service: ROHIL, LESCH 11/01/2018 8:00 AM Medical Record Number:[id number] Patient Account Number: [id number] Date of Birth/Sex: Treating RN: 03-Dec-1950 (68 y.o. [name] Primary Care Keiera Strathman: Christain [place] Other Clinician: Referring Xaniyah Buchholz: Treating Arnetta Odeh/Extender:Robson, Glendale Chard, Letta Moynahan in Treatment: 17 Encounter Discharge Information Items Post Procedure  Vitals Discharge Condition: Stable Temperature (F): 98 Ambulatory Status: Ambulatory Pulse (bpm): 87 Discharge Destination: Home Respiratory Rate (breaths/min): 18 Transportation: Private Auto Blood Pressure (mmHg): 158/80 Accompanied By: self Schedule Follow-up Appointment: Yes Clinical Summary of Care: Electronic Signature(s) Signed: 11/01/2018 6:01:15 PM By: Deon Pilling Entered By: Deon Pilling on 11/01/2018 09:10:51 -------------------------------------------------------------------------------- Lower Extremity Assessment Details Patient Name: Date of Service: JESSICA, SEIDMAN 11/01/2018 8:00 AM Medical Record Number:[id number] Patient Account Number: [id number] Date of Birth/Sex: Treating RN: [birth year] (67 y.o. Jerilynn Mages) Carlene Coria Primary Care Lochlann Mastrangelo: Christain [place] Other Clinician: Referring Wissam Resor: Treating Nikyah Lackman/Extender:Robson, Glendale Chard, Jama Flavors Weeks in Treatment: 17 Edema Assessment Assessed: [Left: No] [Right: No] Edema: [Left: Ye] [Right: s] Calf Left: Right: Point of Measurement: 44 cm From Medial Instep cm 39 cm Ankle Left: Right: Point of Measurement: 11 cm From Medial Instep cm 24 cm Electronic Signature(s) Signed: 12/28/2018 3:01:15 PM By: Carlene Coria RN Entered By: Carlene Coria on 11/01/2018 07:59:01 -------------------------------------------------------------------------------- Multi Wound Chart Details Patient Name: Date of Service: ABDIKADIR, FOHL 11/01/2018 8:00 AM Medical Record Number:[id number] Patient Account Number: [id number] Date of Birth/Sex: Treating RN: 07/30/1950 (68 y.o. [name] Primary Care Demba Nigh: Christain [place] Other Clinician: Referring Tema Alire: Treating Bader Stubblefield/Extender:Robson, Glendale Chard, Letta Moynahan in Treatment: 17 Vital Signs Height(in): 78 Pulse(bpm): 32 Weight(lbs): 335 Blood Pressure(mmHg): 158/80 Body Mass Index(BMI): 41 Temperature(F): 98 Respiratory 18 Rate(breaths/min): Photos:  [2:No Photos] [N/A:N/A] Wound Location: [2:Left Calcaneus] [N/A:N/A] Wounding Event: [2:Gradually Appeared] [N/A:N/A] Primary Etiology: [2:Neuropathic Ulcer-Non Diabetic] [N/A:N/A] Comorbid History: [2:Chronic Obstructive Pulmonary Disease (COPD), Sleep Apnea, Arrhythmia, Coronary Artery Disease, Hypertension, Myocardial Infarction, Osteoarthritis] [N/A:N/A] Date Acquired: [2:01/20/2017] [N/A:N/A] Weeks of Treatment: [2:17] [N/A:N/A] Wound Status: [2:Open] [N/A:N/A] Measurements L x W x D 1.1x1.4x0.7 [N/A:N/A] (cm) Area (cm) : [2:1.21] [N/A:N/A] Volume (cm) : [2:0.847] [N/A:N/A] % Reduction in Area: [2:23.00%] [N/A:N/A] % Reduction in Volume: 64.00% [N/A:N/A] Classification: [2:Full Thickness Without Exposed Support Structures] [N/A:N/A] Exudate Amount: [2:Medium] [N/A:N/A] Exudate Type: [2:Serous] [N/A:N/A] Exudate Color: [2:amber] [N/A:N/A] Wound Margin: [2:Thickened] [N/A:N/A] Granulation Amount: [  2:Large (67-100%)] [N/A:N/A] Granulation Quality: [2:Pink, Pale] [N/A:N/A] Necrotic Amount: [2:Small (1-33%)] [N/A:N/A] Exposed Structures: [2:Fat Layer (Subcutaneous N/A Tissue) Exposed: Yes Fascia: No Tendon: No Muscle: No Joint: No Bone: No] Epithelialization: [2:Small (1-33%)] [N/A:N/A] Debridement: [2:Debridement - Excisional N/A] Pre-procedure [2:08:24] [N/A:N/A] Verification/Time Out Taken: Pain Control: [2:Lidocaine 4% Topical Solution] [N/A:N/A] Tissue Debrided: [2:Callus, Subcutaneous] [N/A:N/A] Level: [2:Skin/Subcutaneous Tissue N/A] Debridement Area (sq cm):1.54 [N/A:N/A] Instrument: [2:Curette] [N/A:N/A] Bleeding: [2:Moderate] [N/A:N/A] Hemostasis Achieved: [2:Silver Nitrate] [N/A:N/A] Procedural Pain: [2:0] [N/A:N/A] Post Procedural Pain: [2:0] [N/A:N/A] Debridement Treatment [2:Procedure was tolerated] [N/A:N/A] Response: [2:well] Post Debridement [2:1.1x1.4x0.7] [N/A:N/A] Measurements L x W x D (cm) Post Debridement [2:0.847] [N/A:N/A] Volume: (cm) Procedures  Performed: [2:Debridement] [N/A:N/A] Treatment Notes Electronic Signature(s) Signed: 11/01/2018 5:50:55 PM By: Linton Ham MD Signed: 11/02/2018 5:52:09 PM By: Levan Hurst RN, BSN Entered By: Linton Ham on 11/01/2018 08:44:56 -------------------------------------------------------------------------------- Multi-Disciplinary Care Plan Details Patient Name: Date of Service: FRANSICO, SCIANDRA 11/01/2018 8:00 AM Medical Record Number:[id number] Patient Account Number: [id number] Date of Birth/Sex: Treating RN: [birth year] (68 y.o. [name] Primary Care Meleni Delahunt: Christain [place] Other Clinician: Referring Aemon Koeller: Treating Myasia Sinatra/Extender:Robson, Glendale Chard, Letta Moynahan in Treatment: 17 Active Inactive Wound/Skin Impairment Nursing Diagnoses: Impaired tissue integrity Knowledge deficit related to smoking impact on wound healing Knowledge deficit related to ulceration/compromised skin integrity Goals: Patient/caregiver will verbalize understanding of skin care regimen Date Initiated: 07/05/2018 Target Resolution Date: 11/26/2018 Goal Status: Active Ulcer/skin breakdown will have a volume reduction of 30% by week 4 Date Initiated: 07/05/2018 Date Inactivated: 08/02/2018 Target Resolution Date: 08/06/2018 Goal Status: Met Ulcer/skin breakdown will have a volume reduction of 50% by week 8 Date Initiated: 08/02/2018 Date Inactivated: 08/30/2018 Target Resolution Date: 09/03/2018 Goal Status: Met Ulcer/skin breakdown will have a volume reduction of 80% by week 12 Date Initiated: 08/30/2018 Date Inactivated: 10/11/2018 Target Resolution Date: 10/01/2018 Unmet Reason: unable to Goal Status: Unmet offload adequately Interventions: Assess patient/caregiver ability to obtain necessary supplies Assess patient/caregiver ability to perform ulcer/skin care regimen upon admission and as needed Assess ulceration(s) every visit Provide education on smoking Provide education on  ulcer and skin care Notes: Electronic Signature(s) Signed: 11/01/2018 5:18:03 PM By: Kela Millin Signed: 11/02/2018 5:52:09 PM By: Levan Hurst RN, BSN Entered By: Kela Millin on 11/01/2018 08:29:05 -------------------------------------------------------------------------------- Pain Assessment Details Patient Name: Date of Service: ERROLL, WILBOURNE 11/01/2018 8:00 AM Medical Record Number:[id number] Patient Account Number: [id number] Date of Birth/Sex: Treating RN: [birth year] (67 y.o. Oval Linsey Primary Care Carleta Woodrow: Christain [place] Other Clinician: Referring Analese Sovine: Treating Davidmichael Zarazua/Extender:Robson, Glendale Chard, Jama Flavors Weeks in Treatment: 17 Active Problems Location of Pain Severity and Description of Pain Patient Has Paino No Site Locations Pain Management and Medication Current Pain Management: Electronic Signature(s) Signed: 12/28/2018 3:01:15 PM By: Carlene Coria RN Entered By: Carlene Coria on 11/01/2018 07:58:08 -------------------------------------------------------------------------------- Patient/Caregiver Education Details Patient Name: [name] 10/12/2020andnbsp8:00 Date of Service: AM Medical Record [id number] Number: Patient Account Number: [id number] Treating RN: Date of Birth/Gender: 07/04/1950 (67 y.o. [name]) Other Clinician: Primary Care Physician: Christain [place] Treating Linton Ham Referring Physician: Physician/Extender: Mindi Junker in Treatment: 17 Education Assessment Education Provided To: Patient Education Topics Provided Wound/Skin Impairment: Methods: Explain/Verbal Responses: State content correctly Electronic Signature(s) Signed: 11/01/2018 5:18:03 PM By: Kela Millin Entered By: Kela Millin on 11/01/2018 08:29:35 -------------------------------------------------------------------------------- Wound Assessment Details Patient Name: Date of Service: KENSINGTON, DUERST  11/01/2018 8:00 AM Medical Record Number:[id number] Patient Account Number: [id number] Date of Birth/Sex: Treating RN: May 27, 1950 (67 y.o. Jerilynn Mages) Carlene Coria Primary Care  Tandrea Kommer: Christain [place] Other Clinician: Referring Chantalle Defilippo: Treating Xan Sparkman/Extender:Robson, Glendale Chard, Jama Flavors Weeks in Treatment: 17 Wound Status Wound Number: 2 Primary Neuropathic Ulcer-Non Diabetic Etiology: Wound Location: Left Calcaneus Wound Open Wounding Event: Gradually Appeared Status: Date Acquired: 01/20/2017 Comorbid Chronic Obstructive Pulmonary Disease Weeks Of Treatment: 17 History: (COPD), Sleep Apnea, Arrhythmia, Coronary Clustered Wound: No Artery Disease, Hypertension, Myocardial Infarction, Osteoarthritis Photos Wound Measurements Length: (cm) 1.1 % Reduct Width: (cm) 1.4 % Reduct Depth: (cm) 0.7 Epitheli Area: (cm) 1.21 Tunneli Volume: (cm) 0.847 Undermi Wound Description Full Thickness Without Exposed Support Foul Odo Classification: Structures Slough/F Wound Thickened Margin: Exudate Medium Amount: Exudate Serous Type: Exudate amber Color: Wound Bed Granulation Amount: Large (67-100%) Granulation Quality: Pink, Pale Fascia E Necrotic Amount: Small (1-33%) Fat Laye Necrotic Quality: Adherent Slough Tendon E Muscle E Joint Ex Bone Exp r After Cleansing: No ibrino Yes Exposed Structure xposed: No r (Subcutaneous Tissue) Exposed: Yes xposed: No xposed: No posed: No osed: No ion in Area: 23% ion in Volume: 64% alization: Small (1-33%) ng: No ning: No Electronic Signature(s) Signed: 11/24/2018 4:01:42 PM By: Mikeal Hawthorne EMT/HBOT Signed: 12/28/2018 3:01:15 PM By: Carlene Coria RN Entered By: Mikeal Hawthorne on 11/02/2018 08:58:34 -------------------------------------------------------------------------------- Vitals Details Patient Name: Date of Service: KINGDOM, VANZANTEN 11/01/2018 8:00 AM Medical Record Number:[id number] Patient Account Number:  [id number] Date of Birth/Sex: Treating RN: [birth year] (67 y.o. Jerilynn Mages) Dolores Lory, Morey Hummingbird Primary Care Lorell Thibodaux: Christain [place] Other Clinician: Referring Ricardo Kayes: Treating Simone Rodenbeck/Extender:Robson, Glendale Chard, Jama Flavors Weeks in Treatment: 17 Vital Signs Time Taken: 07:57 Temperature (F): 98 Height (in): 76 Pulse (bpm): 87 Weight (lbs): 335 Respiratory Rate (breaths/min): 18 Body Mass Index (BMI): 40.8 Blood Pressure (mmHg): 158/80 Reference Range: 80 - 120 mg / dl Electronic Signature(s) Signed: 12/28/2018 3:01:15 PM By: Carlene Coria RN Entered By: Carlene Coria on 11/01/2018 07:57:57

## 2018-12-28 NOTE — Progress Notes (Signed)
John Griffin, John Griffin ([id number]) Visit Report for 11/15/2018 Arrival Information Details Patient Name: Date of Service: John Griffin, John Griffin 11/15/2018 8:00 AM Medical Record Number:[id number] Patient Account Number: [id number] Date of Birth/Sex: Treating RN: 08/01/1950 (67 y.o. John Griffin) Carlene Coria Primary Care British Moyd: Christain [place] Other Clinician: Referring Mayank Teuscher: Treating Belkis Norbeck/Extender:Robson, Glendale Chard, Letta Moynahan in Treatment: 19 Visit Information History Since Last Visit Cane Added or deleted any medications: No Patient Arrived: 08:13 Any new allergies or adverse reactions: No Arrival Time: Had a fall or experienced change in No Accompanied By: wife None activities of daily living that may affect Transfer Assistance: risk of falls: Patient Identification Verified: Yes Signs or symptoms of abuse/neglect since No Secondary Verification Process Completed: Yes last visito Patient Requires Transmission-Based No Hospitalized since last visit: No Precautions: Implantable device outside of the clinic No Patient Has Alerts: No excluding cellular tissue based products placed in the center since last visit: Has Dressing in Place as Prescribed: Yes Has Footwear/Offloading in Place as Yes Prescribed: Left: Total Contact Cast Pain Present Now: Yes Electronic Signature(s) Signed: 12/28/2018 3:01:15 PM By: Carlene Coria RN Entered By: Carlene Coria on 11/15/2018 08:14:31 -------------------------------------------------------------------------------- Encounter Discharge Information Details Patient Name: Date of Service: John Griffin, John Griffin 11/15/2018 8:00 AM Medical Record Number:[id number] Patient Account Number: [id number] Date of Birth/Sex: Treating RN: 1950-07-20 (68 y.o. John Griffin Primary Care Jadia Capers: Christain [place] Other Clinician: Referring Joud Ingwersen: Treating Jaylianna Tatlock/Extender:Robson, Glendale Chard, Letta Moynahan in Treatment: 19 Encounter Discharge Information  Items Discharge Condition: Stable Ambulatory Status: Cane Discharge Destination: Home Transportation: Private Auto Accompanied By: wife Schedule Follow-up Appointment: Yes Clinical Summary of Care: Electronic Signature(s) Signed: 11/15/2018 5:31:38 PM By: Deon Pilling Entered By: Deon Pilling on 11/15/2018 09:46:33 -------------------------------------------------------------------------------- Lower Extremity Assessment Details Patient Name: Date of Service: John Griffin, John Griffin 11/15/2018 8:00 AM Medical Record Number:[id number] Patient Account Number: [id number] Date of Birth/Sex: Treating RN: 1950/05/26 (67 y.o. John Griffin) Carlene Coria Primary Care Clema Skousen: Christain [place] Other Clinician: Referring Morse Brueggemann: Treating Levaeh Vice/Extender:Robson, Glendale Chard, Jama Flavors Weeks in Treatment: 19 Edema Assessment Assessed: [Left: Yes] [Right: No] Edema: [Left: Yes] [Right: No] Calf Left: Right: Point of Measurement: 44 cm From Medial Instep 42 cm cm Ankle Left: Right: Point of Measurement: 11 cm From Medial Instep 25 cm cm Vascular Assessment Pulses: Dorsalis Pedis Palpable: [Left:Yes] Electronic Signature(s) Signed: 12/28/2018 3:01:15 PM By: Carlene Coria RN Entered By: Carlene Coria on 11/15/2018 08:13:56 -------------------------------------------------------------------------------- Multi Wound Chart Details Patient Name: Date of Service: John Griffin, John Griffin 11/15/2018 8:00 AM Medical Record Number:[id number] Patient Account Number: [id number] Date of Birth/Sex: Treating RN: April 08, 1950 (69 y.o. John Griffin Primary Care Camdin Hegner: Christain [place] Other Clinician: Referring Keondre Markson: Treating Phillippa Straub/Extender:Robson, Glendale Chard, Letta Moynahan in Treatment: 19 Vital Signs Height(in): 94 Pulse(bpm): 41 Weight(lbs): 41 Blood Pressure(mmHg): 101/52 Body Mass Index(BMI): 41 Temperature(F): 98.4 Respiratory 18 Rate(breaths/min): Photos: [2:No Photos] [3:No Photos]  [N/A:N/A] Wound Location: [2:Left Calcaneus] [3:Left Lower Leg - Anterior] [N/A:N/A] Wounding Event: [2:Gradually Appeared] [3:Shear/Friction] [N/A:N/A] Primary Etiology: [2:Neuropathic Ulcer-Non Diabetic] [3:Skin Tear] [N/A:N/A] Comorbid History: [2:Chronic Obstructive Pulmonary Disease (COPD), Sleep Apnea, Arrhythmia, Coronary Artery Arrhythmia, Coronary Artery Disease, Hypertension, Myocardial Infarction, Osteoarthritis] [3:Chronic Obstructive Pulmonary Disease (COPD), Sleep  Apnea, Disease, Hypertension, Myocardial Infarction, Osteoarthritis] [N/A:N/A] Date Acquired: [2:01/20/2017] [3:11/11/2018] [N/A:N/A] Weeks of Treatment: [2:19] [3:0] [N/A:N/A] Wound Status: [2:Open] [3:Open] [N/A:N/A] Measurements L x W x D 0.6x0.6x0.7 [3:2x2.1x0.1] [N/A:N/A] (cm) Area (cm) : [2:0.283] [3:3.299] [N/A:N/A] Volume (cm) : [2:0.198] [3:0.33] [N/A:N/A] % Reduction in Area: [2:82.00%] [3:-16.70%] [N/A:N/A] % Reduction in Volume: 91.60% [3:-16.60%] [  N/A:N/A] Classification: [2:Full Thickness Without Exposed Support Structures Exposed Support Structures] [3:Full Thickness Without] [N/A:N/A] Exudate Amount: [2:Medium] [3:Small] [N/A:N/A] Exudate Type: [2:Serosanguineous] [3:Serosanguineous] [N/A:N/A] Exudate Color: [2:red, brown] [3:red, brown] [N/A:N/A] Wound Margin: [2:Thickened] [3:Flat and Intact] [N/A:N/A] Granulation Amount: [2:Medium (34-66%)] [3:Large (67-100%)] [N/A:N/A] Granulation Quality: [2:Pink] [3:Red] [N/A:N/A] Necrotic Amount: [2:Medium (34-66%)] [3:None Present (0%)] [N/A:N/A] Exposed Structures: [2:Fat Layer (Subcutaneous Fat Layer (Subcutaneous N/A Tissue) Exposed: Yes Fascia: No Tendon: No Muscle: No Joint: No Bone: No] [3:Tissue) Exposed: Yes Fascia: No Tendon: No Muscle: No Joint: No Bone: No] Epithelialization: [2:Small (1-33%) Total Contact Cast] [3:None N/A] Treatment Notes Electronic Signature(s) Signed: 11/15/2018 5:48:14 PM By: Linton Ham MD Signed: 11/15/2018 6:04:10 PM  By: Levan Hurst RN, BSN Entered By: Linton Ham on 11/15/2018 09:23:03 -------------------------------------------------------------------------------- Multi-Disciplinary Care Plan Details Patient Name: Date of Service: John Griffin, John Griffin 11/15/2018 8:00 AM Medical Record Number:[id number] Patient Account Number: [id number] Date of Birth/Sex: Treating RN: [birth year] (68 y.o. John Griffin Primary Care Yoona Ishii: Christain [place] Other Clinician: Referring Adaleigh Warf: Treating Mesiah Manzo/Extender:Robson, Glendale Chard, Letta Moynahan in Treatment: 19 Active Inactive Wound/Skin Impairment Nursing Diagnoses: Impaired tissue integrity Knowledge deficit related to smoking impact on wound healing Knowledge deficit related to ulceration/compromised skin integrity Goals: Patient/caregiver will verbalize understanding of skin care regimen Date Initiated: 07/05/2018 Target Resolution Date: 11/26/2018 Goal Status: Active Ulcer/skin breakdown will have a volume reduction of 30% by week 4 Date Initiated: 07/05/2018 Date Inactivated: 08/02/2018 Target Resolution Date: 08/06/2018 Goal Status: Met Ulcer/skin breakdown will have a volume reduction of 50% by week 8 Date Initiated: 08/02/2018 Date Inactivated: 08/30/2018 Target Resolution Date: 09/03/2018 Goal Status: Met Ulcer/skin breakdown will have a volume reduction of 80% by week 12 Date Initiated: 08/30/2018 Date Inactivated: 10/11/2018 Target Resolution Date: 10/01/2018 Unmet Reason: unable to Goal Status: Unmet offload adequately Interventions: Assess patient/caregiver ability to obtain necessary supplies Assess patient/caregiver ability to perform ulcer/skin care regimen upon admission and as needed Assess ulceration(s) every visit Provide education on smoking Provide education on ulcer and skin care Notes: Electronic Signature(s) Signed: 11/15/2018 6:04:10 PM By: Levan Hurst RN, BSN Entered By: Levan Hurst on 11/15/2018  08:04:06 -------------------------------------------------------------------------------- Pain Assessment Details Patient Name: Date of Service: John Griffin, John Griffin 11/15/2018 8:00 AM Medical Record Number:[id number] Patient Account Number: [id number] Date of Birth/Sex: Treating RN: 09/02/1950 (67 y.o. Oval Linsey Primary Care Wylee Ogden: Christain [place] Other Clinician: Referring Fleur Audino: Treating Taiya Nutting/Extender:Robson, Glendale Chard, Jama Flavors Weeks in Treatment: 19 Active Problems Location of Pain Severity and Description of Pain Patient Has Paino Yes Site Locations Pain Location: Pain in Ulcers Rate the pain. Current Pain Level: 2 Worst Pain Level: 10 Least Pain Level: 0 Tolerable Pain Level: 8 Pain Management and Medication Current Pain Management: Medication: No Cold Application: No Rest: No Massage: No Activity: No T.E.N.S.: No Heat Application: No Leg drop or elevation: No Is the Current Pain Management Adequate: Adequate How does your wound impact your activities of daily livingo Sleep: No Bathing: No Appetite: No Relationship With Others: No Bladder Continence: No Emotions: No Bowel Continence: No Work: No Toileting: No Drive: No Dressing: No Hobbies: No Electronic Signature(s) Signed: 12/28/2018 3:01:15 PM By: Carlene Coria RN Entered By: Carlene Coria on 11/15/2018 08:15:42 -------------------------------------------------------------------------------- Patient/Caregiver Education Details Patient Name: [name] 10/26/2020andnbsp8:00 Date of Service: AM Medical Record [id number] Number: Patient Account Number: [id number] Treating RN: August 01, 1950 (68 y.o. Levan Hurst Date of Birth/Gender: M) Other Clinician: Primary Care Physician: Christain [place] Treating Linton Ham Referring Physician: Physician/Extender: Mindi Junker in Treatment: [phone]  Education Assessment Education Provided To: Patient Education Topics  Provided Wound/Skin Impairment: Methods: Explain/Verbal Responses: State content correctly Electronic Signature(s) Signed: 11/15/2018 6:04:10 PM By: Levan Hurst RN, BSN Entered By: Levan Hurst on 11/15/2018 08:04:15 -------------------------------------------------------------------------------- Wound Assessment Details Patient Name: Date of Service: John Griffin, John Griffin 11/15/2018 8:00 AM Medical Record Number:[id number] Patient Account Number: [id number] Date of Birth/Sex: Treating RN: Jun 30, 1950 (67 y.o. John Griffin) Carlene Coria Primary Care Sanvi Ehler: Christain [place] Other Clinician: Referring Anajah Sterbenz: Treating Kianah Harries/Extender:Robson, Glendale Chard, Jama Flavors Weeks in Treatment: 19 Wound Status Wound Number: 2 Primary Neuropathic Ulcer-Non Diabetic Etiology: Wound Location: Left Calcaneus Wound Open Wounding Event: Gradually Appeared Status: Date Acquired: 01/20/2017 Comorbid Chronic Obstructive Pulmonary Disease Weeks Of Treatment: 19 History: (COPD), Sleep Apnea, Arrhythmia, Coronary Clustered Wound: No Artery Disease, Hypertension, Myocardial Infarction, Osteoarthritis Photos Wound Measurements Length: (cm) 0.6 % Reduct Width: (cm) 0.6 % Reduct Depth: (cm) 0.7 Epitheli Area: (cm) 0.283 Tunneli Volume: (cm) 0.198 Undermi Wound Description Classification: Full Thickness Without Exposed Support Foul Odo Structures Slough/F Wound Thickened Margin: Exudate Medium Amount: Exudate Serosanguineous Type: Exudate red, brown Color: Wound Bed Granulation Amount: Medium (34-66%) Granulation Quality: Pink Fascia E Necrotic Amount: Medium (34-66%) Fat Laye Necrotic Quality: Adherent Slough Tendon E Muscle E Joint Ex Bone Exp r After Cleansing: No ibrino Yes Exposed Structure xposed: No r (Subcutaneous Tissue) Exposed: Yes xposed: No xposed: No posed: No osed: No ion in Area: 82% ion in Volume: 91.6% alization: Small (1-33%) ng: No ning: No Electronic  Signature(s) Signed: 11/16/2018 3:44:28 PM By: Mikeal Hawthorne EMT/HBOT Signed: 12/28/2018 3:01:15 PM By: Carlene Coria RN Entered By: Mikeal Hawthorne on 11/16/2018 11:05:20 -------------------------------------------------------------------------------- Wound Assessment Details Patient Name: Date of Service: John Griffin, John Griffin 11/15/2018 8:00 AM Medical Record Number:[id number] Patient Account Number: [id number] Date of Birth/Sex: Treating RN: 20-Mar-1950 (67 y.o. John Griffin) Carlene Coria Primary Care Jahrell Hamor: Christain [place] Other Clinician: Referring Derold Dorsch: Treating Lysa Livengood/Extender:Robson, Glendale Chard, Jama Flavors Weeks in Treatment: 19 Wound Status Wound Number: 3 Primary Skin Tear Etiology: Wound Location: Left Lower Leg - Anterior Wound Open Wounding Event: Shear/Friction Status: Date Acquired: 11/11/2018 Comorbid Chronic Obstructive Pulmonary Disease Weeks Of Treatment: 0 History: (COPD), Sleep Apnea, Arrhythmia, Coronary Clustered Wound: No Artery Disease, Hypertension, Myocardial Infarction, Osteoarthritis Photos Wound Measurements Length: (cm) 2 % Reduct Width: (cm) 2.1 % Reduct Depth: (cm) 0.1 Epitheli Area: (cm) 3.299 Tunneli Volume: (cm) 0.33 Undermi Wound Description Full Thickness Without Exposed Support Foul Odo Classification: Structures Slough/F Wound Flat and Intact Margin: Exudate Small Amount: Exudate Serosanguineous Type: Exudate red, brown Color: Wound Bed Granulation Amount: Large (67-100%) Granulation Quality: Red Fascia E Necrotic Amount: None Present (0%) Fat Laye Tendon E Muscle E Joint Ex Bone Exp r After Cleansing: No ibrino No Exposed Structure xposed: No r (Subcutaneous Tissue) Exposed: Yes xposed: No xposed: No posed: No osed: No ion in Area: -16.7% ion in Volume: -16.6% alization: None ng: No ning: No Electronic Signature(s) Signed: 11/16/2018 3:44:28 PM By: Mikeal Hawthorne EMT/HBOT Signed: 12/28/2018 3:01:15 PM By: Carlene Coria RN Entered By: Mikeal Hawthorne on 11/16/2018 11:05:43 -------------------------------------------------------------------------------- Vitals Details Patient Name: Date of Service: KENTA, LASTER 11/15/2018 8:00 AM Medical Record Number:[id number] Patient Account Number: [id number] Date of Birth/Sex: Treating RN: [birth year] (67 y.o. John Griffin) Dolores Lory, Fisher Primary Care Maryanne Huneycutt: Christain [place] Other Clinician: Referring Safal Halderman: Treating Natalynn Pedone/Extender:Robson, Glendale Chard, Jama Flavors Weeks in Treatment: 19 Vital Signs Time Taken: 08:14 Temperature (F): 98.4 Height (in): 76 Pulse (bpm): 79 Weight (lbs): 335 Respiratory Rate (breaths/min): 18 Body Mass Index (BMI): 40.8 Blood Pressure (  mmHg): 101/52 Reference Range: 80 - 120 mg / dl Electronic Signature(s) Signed: 12/28/2018 3:01:15 PM By: Carlene Coria RN Entered By: Carlene Coria on 11/15/2018 08:15:25

## 2018-12-29 NOTE — Progress Notes (Signed)
RAIN, FRIEDT ([id number]) Visit Report for 11/22/2018 Arrival Information Details Patient Name: Date of Service: John Griffin, John Griffin 11/22/2018 8:30 AM Medical Record Number:[id number] Patient Account Number: [id number] Date of Birth/Sex: Treating RN: 24-Sep-1950 (68 y.o. Jerilynn Mages) Carlene Coria Primary Care Preslie Depasquale: Christain [place] Other Clinician: Referring Tamryn Popko: Treating Kataleia Quaranta/Extender:Robson, Glendale Chard, Letta Moynahan in Treatment: 20 Visit Information History Since Last Visit All ordered tests and consults were No Patient Arrived: Ambulatory completed: Arrival Time: 08:22 Added or deleted any medications: No Accompanied By: self Any new allergies or adverse reactions: No Transfer Assistance: None Had a fall or experienced change in No Patient Identification Verified: Yes activities of daily living that may affect Secondary Verification Process Yes risk of falls: Completed: Signs or symptoms of abuse/neglect since No Patient Requires Transmission-Based No last visito Precautions: Hospitalized since last visit: No Patient Has Alerts: No Implantable device outside of the clinic No excluding cellular tissue based products placed in the center since last visit: Has Dressing in Place as Prescribed: Yes Has Compression in Place as Prescribed: Yes Has Footwear/Offloading in Place as Yes Prescribed: Right: Total Contact Cast Pain Present Now: No Electronic Signature(s) Signed: 12/29/2018 12:05:18 PM By: Carlene Coria RN Entered By: Carlene Coria on 11/22/2018 08:36:42 -------------------------------------------------------------------------------- Encounter Discharge Information Details Patient Name: Date of Service: John Griffin, John Griffin 11/22/2018 8:30 AM Medical Record [id number] Patient Account Number: [id number] Date of Birth/Sex: Treating RN: [birth year] (68 y.o. [name] Primary Care Alek Poncedeleon: Christain [place] Other Clinician: Referring Brihanna Devenport: Treating  Demiya Magno/Extender:Robson, Glendale Chard, Letta Moynahan in Treatment: 20 Encounter Discharge Information Items Post Procedure Vitals Discharge Condition: Stable Temperature (F): 98.2 Ambulatory Status: Cane Pulse (bpm): 98 Discharge Destination: Home Respiratory Rate (breaths/min): 20 Transportation: Private Auto Blood Pressure (mmHg): 168/89 Accompanied By: self Schedule Follow-up Appointment: Yes Clinical Summary of Care: Electronic Signature(s) Signed: 11/22/2018 5:49:34 PM By: Deon Pilling Entered By: Deon Pilling on 11/22/2018 09:45:58 -------------------------------------------------------------------------------- Lower Extremity Assessment Details Patient Name: Date of Service: John Griffin, John Griffin 11/22/2018 8:30 AM Medical Record [id number] Patient Account Number: [id number] Date of Birth/Sex: Treating RN: 25-May-1950 (68 y.o. Jerilynn Mages) Carlene Coria Primary Care Karey Suthers: Christain [place] Other Clinician: Referring Fillmore Bynum: Treating Aizley Stenseth/Extender:Robson, Glendale Chard, Jama Flavors Weeks in Treatment: 20 Edema Assessment Assessed: [Left: No] [Right: No] Edema: [Left: Yes] [Right: No] Calf Left: Right: Point of Measurement: 44 cm From Medial Instep 40 cm cm Ankle Left: Right: Point of Measurement: 11 cm From Medial Instep 24 cm cm Electronic Signature(s) Signed: 12/29/2018 12:05:18 PM By: Carlene Coria RN Entered By: Carlene Coria on 11/22/2018 08:37:38 -------------------------------------------------------------------------------- Multi Wound Chart Details Patient Name: Date of Service: John Griffin, John Griffin 11/22/2018 8:30 AM Medical Record [id number] Patient Account Number: [id number] Date of Birth/Sex: Treating RN: [birth year] (68 y.o. [name] Primary Care Lenzy Kerschner: Christain [place] Other Clinician: Referring Kallista Pae: Treating Maame Dack/Extender:Robson, Glendale Chard, Jama Flavors Weeks in Treatment: 20 Vital Signs Height(in): 74 Pulse(bpm): 72 Weight(lbs):  23 Blood Pressure(mmHg): 168/89 Body Mass Index(BMI): 41 Temperature(F): 98.2 Respiratory 20 Rate(breaths/min): Photos: [2:No Photos] [3:No Photos] [N/A:N/A] Wound Location: [2:Left Calcaneus] [3:Left Lower Leg - Anterior] [N/A:N/A] Wounding Event: [2:Gradually Appeared] [3:Shear/Friction] [N/A:N/A] Primary Etiology: [2:Neuropathic Ulcer-Non Diabetic] [3:Skin Tear] [N/A:N/A] Comorbid History: [2:Chronic Obstructive Pulmonary Disease (COPD), Sleep Apnea, Arrhythmia, Coronary Artery Arrhythmia, Coronary Artery Disease, Hypertension, Myocardial Infarction, Osteoarthritis] [3:Chronic Obstructive Pulmonary Disease (COPD), Sleep  Apnea, Disease, Hypertension, Myocardial Infarction, Osteoarthritis] [N/A:N/A] Date Acquired: [2:01/20/2017] [3:11/11/2018] [N/A:N/A] Weeks of Treatment: [2:20] [3:1] [N/A:N/A] Wound Status: [2:Open] [3:Open] [N/A:N/A] Measurements L x W x D 0.5x0.7x0.4 [3:1x0.6x0.1] [N/A:N/A] (cm)  Area (cm) : [2:0.275] [3:0.471] [N/A:N/A] Volume (cm) : [2:0.11] [3:0.047] [N/A:N/A] % Reduction in Area: [2:82.50%] [3:83.30%] [N/A:N/A] % Reduction in Volume: 95.30% [3:83.40%] [N/A:N/A] Classification: [2:Full Thickness Without Exposed Support Structures Exposed Support Structures] [3:Full Thickness Without] [N/A:N/A] Exudate Amount: [2:Medium] [3:Small] [N/A:N/A] Exudate Type: [2:Serosanguineous] [3:Serosanguineous] [N/A:N/A] Exudate Color: [2:red, brown] [3:red, brown] [N/A:N/A] Wound Margin: [2:Thickened] [3:Flat and Intact] [N/A:N/A] Granulation Amount: [2:Medium (34-66%)] [3:Large (67-100%)] [N/A:N/A] Granulation Quality: [2:Pink] [3:Red] [N/A:N/A] Necrotic Amount: [2:Medium (34-66%)] [3:None Present (0%)] [N/A:N/A] Exposed Structures: [2:Fat Layer (Subcutaneous Fat Layer (Subcutaneous N/A Tissue) Exposed: Yes Fascia: No Tendon: No Muscle: No Joint: No Bone: No] [3:Tissue) Exposed: Yes Fascia: No Tendon: No Muscle: No Joint: No Bone: No] Epithelialization: [2:Small (1-33%)]  [3:None] [N/A:N/A] Debridement: [2:Debridement - Excisional N/A] [N/A:N/A] Pre-procedure [2:09:04] [3:N/A] [N/A:N/A] Verification/Time Out Taken: Pain Control: [2:Other] [3:N/A] [N/A:N/A] Tissue Debrided: [2:Callus, Subcutaneous] [3:N/A] [N/A:N/A] Level: [2:Skin/Subcutaneous Tissue] [3:N/A] [N/A:N/A] Debridement Area (sq cm):0.35 [3:N/A] [N/A:N/A] Instrument: [2:Curette] [3:N/A] [N/A:N/A] Bleeding: [2:Minimum] [3:N/A] [N/A:N/A] Procedural Pain: [2:0] [3:N/A] [N/A:N/A] Post Procedural Pain: [2:0] [3:N/A] [N/A:N/A] Debridement Treatment Procedure was tolerated [3:N/A] [N/A:N/A] Response: [2:well] Post Debridement [2:0.7x0.7x0.4] [3:N/A] [N/A:N/A] Measurements L x W x D (cm) Post Debridement [2:0.154] [3:N/A] [N/A:N/A] Volume: (cm) Procedures Performed: Debridement [2:Total Contact Cast] [3:N/A] [N/A:N/A] Treatment Notes Electronic Signature(s) Signed: 11/22/2018 5:57:33 PM By: Linton Ham MD Signed: 11/22/2018 6:00:40 PM By: Levan Hurst RN, BSN Entered By: Linton Ham on 11/22/2018 09:14:36 -------------------------------------------------------------------------------- Multi-Disciplinary Care Plan Details Patient Name: Date of Service: John Griffin, John Griffin 11/22/2018 8:30 AM Medical Record Number:[id number] Patient Account Number: [id number] Date of Birth/Sex: Treating RN: [birth year] (68 y.o. Marvis Repress Primary Care Quashaun Lazalde: Christain [place] Other Clinician: Referring Dayson Aboud: Treating Dontavis Tschantz/Extender:Robson, Glendale Chard, Letta Moynahan in Treatment: 20 Active Inactive Wound/Skin Impairment Nursing Diagnoses: Impaired tissue integrity Knowledge deficit related to smoking impact on wound healing Knowledge deficit related to ulceration/compromised skin integrity Goals: Patient/caregiver will verbalize understanding of skin care regimen Date Initiated: 07/05/2018 Target Resolution Date: 12/17/2018 Goal Status: Active Ulcer/skin breakdown will have a volume  reduction of 30% by week 4 Date Initiated: 07/05/2018 Date Inactivated: 08/02/2018 Target Resolution Date: 08/06/2018 Goal Status: Met Ulcer/skin breakdown will have a volume reduction of 50% by week 8 Date Initiated: 08/02/2018 Date Inactivated: 08/30/2018 Target Resolution Date: 09/03/2018 Goal Status: Met Ulcer/skin breakdown will have a volume reduction of 80% by week 12 Date Initiated: 08/30/2018 Date Inactivated: 10/11/2018 Target Resolution Date: 10/01/2018 Unmet Reason: unable to Goal Status: Unmet offload adequately Interventions: Assess patient/caregiver ability to obtain necessary supplies Assess patient/caregiver ability to perform ulcer/skin care regimen upon admission and as needed Assess ulceration(s) every visit Provide education on smoking Provide education on ulcer and skin care Notes: Electronic Signature(s) Signed: 11/25/2018 5:16:49 PM By: Kela Millin Entered By: Kela Millin on 11/22/2018 08:19:08 -------------------------------------------------------------------------------- Pain Assessment Details Patient Name: Date of Service: John Griffin, John Griffin 11/22/2018 8:30 AM Medical Record Number:[id number] Patient Account Number: [id number] Date of Birth/Sex: Treating RN: 06/24/1950 (68 y.o. Oval Linsey Primary Care Kahleel Fadeley: Christain [place] Other Clinician: Referring Jadeyn Hargett: Treating Ozan Maclay/Extender:Robson, Glendale Chard, Jama Flavors Weeks in Treatment: 20 Active Problems Location of Pain Severity and Description of Pain Patient Has Paino No Site Locations Pain Management and Medication Current Pain Management: Electronic Signature(s) Signed: 12/29/2018 12:05:18 PM By: Carlene Coria RN Entered By: Carlene Coria on 11/22/2018 08:37:22 -------------------------------------------------------------------------------- Patient/Caregiver Education Details Patient Name: Date of Service: [name] 11/2/2020andnbsp8:30 AM Medical Record Number:[id number]  Patient Account Number: [id number] Date of Birth/Gender: March 11, 1950 (68 y.o. M) Treating RN: Kela Millin Primary  Care Physician: Christain [place] Other Clinician: Referring Physician: Treating Physician/Extender:Robson, Glendale Chard, Letta Moynahan in Treatment: 20 Education Assessment Education Provided To: Patient Education Topics Provided Smoking and Wound Healing: Methods: Explain/Verbal Responses: State content correctly Wound/Skin Impairment: Methods: Explain/Verbal Responses: State content correctly Electronic Signature(s) Signed: 11/25/2018 5:16:49 PM By: Kela Millin Entered By: Kela Millin on 11/22/2018 08:19:26 -------------------------------------------------------------------------------- Wound Assessment Details Patient Name: Date of Service: John Griffin, John Griffin 11/22/2018 8:30 AM Medical Record Number:[id number] Patient Account Number: [id number] Date of Birth/Sex: Treating RN: 1950-11-09 (68 y.o. Jerilynn Mages) Carlene Coria Primary Care Myah Guynes: Christain [place] Other Clinician: Referring Lashanna Angelo: Treating Keslee Harrington/Extender:Robson, Glendale Chard, Jama Flavors Weeks in Treatment: 20 Wound Status Wound Number: 2 Primary Neuropathic Ulcer-Non Diabetic Etiology: Wound Location: Left Calcaneus Wound Open Wounding Event: Gradually Appeared Status: Date Acquired: 01/20/2017 Date Acquired: 01/20/2017 Comorbid Chronic Obstructive Pulmonary Disease Weeks Of Treatment: 20 History: (COPD), Sleep Apnea, Arrhythmia, Coronary Clustered Wound: No Artery Disease, Hypertension, Myocardial Infarction, Osteoarthritis Photos Wound Measurements Length: (cm) 0.5 % Reduct Width: (cm) 0.7 % Reduct Depth: (cm) 0.4 Epitheli Area: (cm) 0.275 Tunneli Volume: (cm) 0.11 Undermi Wound Description Full Thickness Without Exposed Support Foul Odo Classification: Structures Slough/F Wound Thickened Margin: Exudate Medium Amount: Exudate Serosanguineous Type: Exudate red,  brown Color: Wound Bed Granulation Amount: Medium (34-66%) Granulation Quality: Pink Fascia E Necrotic Amount: Medium (34-66%) Fat Laye Necrotic Quality: Adherent Slough Tendon E Muscle E Joint Ex Bone Exp r After Cleansing: No ibrino Yes Exposed Structure xposed: No r (Subcutaneous Tissue) Exposed: Yes xposed: No xposed: No posed: No osed: No ion in Area: 82.5% ion in Volume: 95.3% alization: Small (1-33%) ng: No ning: No Electronic Signature(s) Signed: 11/24/2018 3:21:07 PM By: Mikeal Hawthorne EMT/HBOT Signed: 12/29/2018 12:05:18 PM By: Carlene Coria RN Entered By: Mikeal Hawthorne on 11/24/2018 09:43:48 -------------------------------------------------------------------------------- Wound Assessment Details Patient Name: Date of Service: John Griffin, John Griffin 11/22/2018 8:30 AM Medical Record [id number] Patient Account Number: [id number] Date of Birth/Sex: Treating RN: 02-Aug-1950 (68 y.o. Jerilynn Mages) Carlene Coria Primary Care Monty Mccarrell: Christain [place] Other Clinician: Referring Breiana Stratmann: Treating Merriam Brandner/Extender:Robson, Glendale Chard, Jama Flavors Weeks in Treatment: 20 Wound Status Wound Number: 3 Primary Skin Tear Etiology: Wound Location: Left Lower Leg - Anterior Wound Open Wounding Event: Shear/Friction Status: Date Acquired: 11/11/2018 Comorbid Chronic Obstructive Pulmonary Disease Weeks Of Treatment: 1 History: (COPD), Sleep Apnea, Arrhythmia, Coronary Clustered Wound: No Artery Disease, Hypertension, Myocardial Infarction, Osteoarthritis Photos Wound Measurements Length: (cm) 1 % Reduct Width: (cm) 0.6 % Reduct Depth: (cm) 0.1 Epitheli Area: (cm) 0.471 Tunneli Volume: (cm) 0.047 Undermi Wound Description Full Thickness Without Exposed Support Foul Odo Classification: Structures Slough/F Wound Flat and Intact Margin: Exudate Small Amount: Exudate Serosanguineous Type: Exudate red, brown Color: Wound Bed Granulation Amount: Large  (67-100%) Granulation Quality: Red Fascia E Necrotic Amount: None Present (0%) Fat Laye Tendon E Muscle E Joint Ex Bone Exp r After Cleansing: No ibrino No Exposed Structure xposed: No r (Subcutaneous Tissue) Exposed: Yes xposed: No xposed: No posed: No osed: No ion in Area: 83.3% ion in Volume: 83.4% alization: None ng: No ning: No Electronic Signature(s) Signed: 11/24/2018 3:21:07 PM By: Mikeal Hawthorne EMT/HBOT Signed: 12/29/2018 12:05:18 PM By: Carlene Coria RN Entered By: Mikeal Hawthorne on 11/24/2018 09:42:50 -------------------------------------------------------------------------------- Vitals Details Patient Name: Date of Service: John Griffin, John Griffin 11/22/2018 8:30 AM Medical Record Number:[id number] Patient Account Number: [id number] Date of Birth/Sex: Treating RN: [birth year] (68 y.o. Oval Linsey Primary Care Alexsis Kathman: Christain [place] Other Clinician: Referring Orell Hurtado: Treating Etienne Mowers/Extender:Robson, Glendale Chard, Jama Flavors Weeks in Treatment:  20 Vital Signs Time Taken: 08:36 Temperature (F): 98.2 Height (in): 76 Pulse (bpm): 98 Weight (lbs): 335 Respiratory Rate (breaths/min): 20 Body Mass Index (BMI): 40.8 Blood Pressure (mmHg): 168/89 Reference Range: 80 - 120 mg / dl Electronic Signature(s) Signed: 12/29/2018 12:05:18 PM By: Carlene Coria RN Entered By: Carlene Coria on 11/22/2018 08:37:16

## 2019-02-05 ENCOUNTER — Other Ambulatory Visit: Payer: Self-pay | Admitting: Cardiovascular Disease

## 2019-02-08 NOTE — Telephone Encounter (Signed)
Rx(s) sent to pharmacy electronically.  

## 2019-02-10 ENCOUNTER — Other Ambulatory Visit: Payer: Self-pay | Admitting: *Deleted

## 2019-02-10 ENCOUNTER — Ambulatory Visit
Admission: RE | Admit: 2019-02-10 | Discharge: 2019-02-10 | Disposition: A | Discharge status: Home / Self Care | Payer: Medicare Other | Source: Ambulatory Visit | Attending: Acute Care | Admitting: Acute Care

## 2019-02-10 DIAGNOSIS — F17210 Nicotine dependence, cigarettes, uncomplicated: Secondary | ICD-10-CM

## 2019-02-10 DIAGNOSIS — Z87891 Personal history of nicotine dependence: Secondary | ICD-10-CM

## 2019-02-10 DIAGNOSIS — Z122 Encounter for screening for malignant neoplasm of respiratory organs: Secondary | ICD-10-CM

## 2019-02-10 NOTE — Progress Notes (Signed)
Please call patient and let them  know their  low dose Ct was read as a Lung RADS 2: nodules that are benign in appearance and behavior with a very low likelihood of becoming a clinically active cancer due to size or lack of growth. Recommendation per radiology is for a repeat LDCT in 12 months. .Please let them  know we will order and schedule their  annual screening scan for 01/2020. Please let them  know there was notation of CAD on their  scan.  Please remind the patient  that this is a non-gated exam therefore degree or severity of disease  cannot be determined. Please have them  follow up with their PCP regarding potential risk factor modification, dietary therapy or pharmacologic therapy if clinically indicated. Pt.  is  currently on statin therapy. Please place order for annual  screening scan for  01/2020 and fax results to PCP. Thanks so much. 

## 2019-02-11 ENCOUNTER — Other Ambulatory Visit: Payer: Self-pay

## 2019-02-11 MED ORDER — LOSARTAN POTASSIUM 25 MG PO TABS: 25.0000 mg | ORAL_TABLET | Freq: Every day | ORAL | 0 refills | Status: DC

## 2019-02-16 ENCOUNTER — Encounter: Payer: Self-pay | Admitting: Cardiovascular Disease

## 2019-02-16 ENCOUNTER — Telehealth: Payer: Self-pay | Admitting: Cardiovascular Disease

## 2019-02-16 NOTE — Telephone Encounter (Signed)
Called 02/14/19 and 02/16/19 to schedule patient to see Dr. Orlean Bradford will not complete---will mail letter requesting patient to call and schedule appointment

## 2019-02-18 ENCOUNTER — Other Ambulatory Visit: Payer: Self-pay | Admitting: Cardiovascular Disease

## 2019-02-22 ENCOUNTER — Other Ambulatory Visit: Payer: Self-pay

## 2019-02-22 ENCOUNTER — Other Ambulatory Visit: Payer: Self-pay | Admitting: Cardiovascular Disease

## 2019-02-22 MED ORDER — CLOPIDOGREL BISULFATE 75 MG PO TABS: 75.0000 mg | ORAL_TABLET | Freq: Every day | ORAL | 0 refills | Status: DC

## 2019-02-22 NOTE — Telephone Encounter (Signed)
*  STAT* If patient is at the pharmacy, call can be transferred to refill team.   1. Which medications need to be refilled? (please list name of each medication and dose if known)   Clopidogrel and Klor-Con  2. Which pharmacy/location (including street and city if local pharmacy) is medication to be sent to [facility] [place]   3. Do they need a 30 day or 90 day supply? 90 days and refills*

## 2019-03-15 NOTE — Telephone Encounter (Signed)
Sarah: can you please help direct me on what to advise this pt regarding this email, thanks!!  One year ago in Jan. your office enrolled me in a yearly low dose ct lung screening at no cost to me. Last year went fine. This year [facility] billed for $142.37.Marland Kitchen What happened to the free program? Thank you for time. John Griffin

## 2019-03-16 ENCOUNTER — Ambulatory Visit (INDEPENDENT_AMBULATORY_CARE_PROVIDER_SITE_OTHER): Payer: Medicare Other | Admitting: Cardiovascular Disease

## 2019-03-16 ENCOUNTER — Other Ambulatory Visit: Payer: Self-pay

## 2019-03-16 ENCOUNTER — Encounter: Payer: Self-pay | Admitting: Cardiovascular Disease

## 2019-03-16 VITALS — BP 140/80 | HR 89 | Temp 98.4°F | Ht 76.0 in | Wt 321.4 lb

## 2019-03-16 DIAGNOSIS — I10 Essential (primary) hypertension: Secondary | ICD-10-CM

## 2019-03-16 DIAGNOSIS — E785 Hyperlipidemia, unspecified: Secondary | ICD-10-CM

## 2019-03-16 DIAGNOSIS — G4733 Obstructive sleep apnea (adult) (pediatric): Secondary | ICD-10-CM | POA: Diagnosis not present

## 2019-03-16 DIAGNOSIS — I214 Non-ST elevation (NSTEMI) myocardial infarction: Secondary | ICD-10-CM

## 2019-03-16 LAB — HEPATIC FUNCTION PANEL
ALT: 57 IU/L — ABNORMAL HIGH (ref 0–44)
AST: 48 IU/L — ABNORMAL HIGH (ref 0–40)
Albumin: 4 g/dL (ref 3.8–4.8)
Alkaline Phosphatase: 162 IU/L — ABNORMAL HIGH (ref 39–117)
Bilirubin Total: 0.6 mg/dL (ref 0.0–1.2)
Bilirubin, Direct: 0.2 mg/dL (ref 0.00–0.40)
Total Protein: 6.8 g/dL (ref 6.0–8.5)

## 2019-03-16 LAB — LIPID PANEL
Chol/HDL Ratio: 3.4 ratio (ref 0.0–5.0)
Cholesterol, Total: 103 mg/dL (ref 100–199)
HDL: 30 mg/dL — ABNORMAL LOW (ref >39)
LDL Chol Calc (NIH): 54 mg/dL (ref 0–99)
Triglycerides: 102 mg/dL (ref 0–149)
VLDL Cholesterol Cal: 19 mg/dL (ref 5–40)

## 2019-03-16 NOTE — Patient Instructions (Signed)
Medication Instructions:  Your physician recommends that you continue on your current medications as directed. Please refer to the Current Medication list given to you today.  If you need a refill on your cardiac medications before your next appointment, please call your pharmacy.   Lab work: Lipids and Hepatic Function If you have labs (blood work) drawn today and your tests are completely normal, you will receive your results only by: [facility] (if you have MyChart) OR A paper copy in the mail If you have any lab test that is abnormal or we need to change your treatment, we will call you to review the results.  Testing/Procedures: NONE  Follow-Up: At [facility], you and your health needs are our priority.  As part of our continuing mission to provide you with exceptional heart care, we have created designated Provider Care Teams.  These Care Teams include your primary Cardiologist (physician) and Advanced Practice Providers (APPs -  Physician Assistants and Nurse Practitioners) who all work together to provide you with the care you need, when you need it. You may see Quay Burow, MD or one of the following Advanced Practice Providers on your designated Care Team:    Kerin Ransom, PA-C  [place], [place]  Coletta Memos, [place]  Your physician wants you to follow-up in: 1 year with Dr. [name]. You will receive a reminder letter in the mail two months in advance. If you don't receive a letter, please call our office to schedule the follow-up appointment.

## 2019-03-16 NOTE — Assessment & Plan Note (Signed)
History of non-STEMI 02/03/2016 with cardiac catheterization performed the following day revealing a total nondominant circumflex, 70% proximal LAD and mid dominant RCA with an EF of 50 to 55%.  He had inferoapical hypokinesia.  I elected to treat him medically at that time because of the subacute nature of the occlusion.  He has been on dual antiplatelet therapy.  He denies chest pain or shortness of breath

## 2019-03-16 NOTE — Assessment & Plan Note (Signed)
History of obstructive sleep apnea on CPAP which she benefits from 

## 2019-03-16 NOTE — Assessment & Plan Note (Signed)
History of essential hypertension blood pressure measured today at 140/80.  He is on losartan and metoprolol.

## 2019-03-16 NOTE — Telephone Encounter (Signed)
Forwarding to Kathlee Nations per Sarah request to look into this, thanks

## 2019-03-16 NOTE — Assessment & Plan Note (Signed)
History of ongoing tobacco abuse of 1 pack/day recalcitrant to risk factor modification. 

## 2019-03-16 NOTE — Assessment & Plan Note (Signed)
History of dyslipidemia on high-dose statin therapy.  We will recheck a fasting lipid liver profile this morning.

## 2019-03-16 NOTE — Progress Notes (Signed)
03/16/2019 John Griffin   06/28/1950  [id number]  Primary Physician Christain Sacramento, MD Primary Cardiologist: Lorretta Harp MD [name], [place]  HPI:  John Griffin is a 69 y.o.  moderatelyoverweight married Caucasian male father 2, grandfather to 3 grandchildren whose wife is also patient in mind. He is retired Geophysical data processor.I last saw him in the office  03/10/2018.Marland KitchenHe has a history of discontinued tobacco abuse of 45 pack years and COPD, treated hypertension and hyperlipidemia. He had non-STEMI 6 days prior to admission on 02/03/16 with post-MI angina. I catheterized him on 02/04/16 revealing 100 nondominant circumflex, 70% proximal LAD and mid dominant RCA and EF of 50-55%. There was inferoapical hypokinesia. I elected not to intervene because of the subacute nature of the occlusion.   Since I saw him a year ago he remained stable.  He unfortunately continues to smoke a pack per day recalcitrant to respect modification.  He does have some shortness of breath probably related to COPD.  He denies chest pain.   Current Meds  Medication Sig  . acetaminophen (TYLENOL) 500 MG tablet Take 1,000 mg by mouth every 6 (six) hours as needed for mild pain. Reported on 05/09/2015  . albuterol (PROVENTIL HFA;VENTOLIN HFA) 108 (90 Base) MCG/ACT inhaler Inhale 2 puffs into the lungs every 4 (four) hours as needed for wheezing or shortness of breath.  Marland Kitchen aspirin 81 MG chewable tablet Chew 1 tablet (81 mg total) by mouth daily.  Marland Kitchen atorvastatin (LIPITOR) 80 MG tablet Take 1 tablet (80 mg total) by mouth daily.  . calcium carbonate (TUMS EX) 750 MG chewable tablet Chew 1 tablet by mouth as needed for heartburn.   . cetirizine (ZYRTEC) 10 MG tablet Take 10 mg by mouth daily.   . Chlorpheniramine-DM (CORICIDIN HBP COUGH/COLD PO) Take 1 tablet by mouth at bedtime as needed (congestion).  . Cholecalciferol (D3 HIGH POTENCY) 2000 units CAPS Take 2,000 Units by mouth daily.   . clopidogrel  (PLAVIX) 75 MG tablet Take 1 tablet (75 mg total) by mouth daily. Please keep upcoming appt for refills. Thank you  . fluticasone (FLONASE) 50 MCG/ACT nasal spray Place 2 sprays into both nostrils daily.   . furosemide (LASIX) 20 MG tablet Take 1 tablet (20 mg total) by mouth daily.  . isosorbide mononitrate (IMDUR) 30 MG 24 hr tablet TAKE 1 TABLET BY MOUTH EVERY DAY  . losartan (COZAAR) 25 MG tablet Take 1 tablet (25 mg total) by mouth daily. Due for yearly follow up with cardiology  . metFORMIN (GLUCOPHAGE) 500 MG tablet Take by mouth.  . metoprolol tartrate (LOPRESSOR) 25 MG tablet TAKE 1 TABLET BY MOUTH TWICE A DAY  . nitroGLYCERIN (NITROSTAT) 0.4 MG SL tablet Place 1 tablet (0.4 mg total) under the tongue every 5 (five) minutes x 3 doses as needed for chest pain.  . potassium chloride (KLOR-CON M10) 10 MEQ tablet Take 1 tablet (10 mEq total) by mouth daily. Please make annual appt with Dr. [name] for refills. [phone].  Marland Kitchen tiotropium (SPIRIVA) 18 MCG inhalation capsule Place into inhaler and inhale.  . umeclidinium bromide (INCRUSE ELLIPTA) 62.5 MCG/INH AEPB Inhale 1 puff into the lungs daily.  . [DISCONTINUED] calcium carbonate (CALCIUM 600) 1500 (600 Ca) MG TABS tablet Take 1,200 mg of elemental calcium by mouth daily.      No Known Allergies  Social History   Socioeconomic History  . Marital status: Married    Spouse name: Not on file  . Number  of children: Not on file  . Years of education: Not on file  . Highest education level: Not on file  Occupational History  . Not on file  Tobacco Use  . Smoking status: Current Every Day Smoker    Packs/day: 1.00    Years: 47.00    Pack years: 47.00    Types: Cigarettes  . Smokeless tobacco: Never Used  . Tobacco comment: back to smoking a 1 ppd since 2018  Substance and Sexual Activity  . Alcohol use: Yes    Alcohol/week: 0.0 standard drinks    Comment: occasional   . Drug use: No  . Sexual activity: Yes  Other Topics Concern   . 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 [name] with [place]   Social Determinants of Health   Financial Resource Strain:   . Difficulty of Paying Living Expenses: Not on file  Food Insecurity:   . Worried About Charity fundraiser in the Last Year: Not on file  . Ran Out of Food in the Last Year: Not on file  Transportation Needs:   . Lack of Transportation (Medical): Not on file  . Lack of Transportation (Non-Medical): Not on file  Physical Activity:   . Days of Exercise per Week: Not on file  . Minutes of Exercise per Session: Not on file  Stress:   . Feeling of Stress : Not on file  Social Connections:   . Frequency of Communication with Friends and Family: Not on file  . Frequency of Social Gatherings with Friends and Family: Not on file  . Attends Religious Services: Not on file  . Active Member of Clubs or Organizations: Not on file  . Attends Archivist Meetings: Not on file  . Marital Status: Not on file  Intimate Partner Violence:   . Fear of Current or Ex-Partner: Not on file  . Emotionally Abused: Not on file  . Physically Abused: Not on file  . Sexually Abused: Not on file     Review of Systems: General: negative for chills, fever, night sweats or weight changes.  Cardiovascular: negative for chest pain, dyspnea on exertion, edema, orthopnea, palpitations, paroxysmal nocturnal dyspnea or shortness of breath Dermatological: negative for rash Respiratory: negative for cough or wheezing Urologic: negative for hematuria Abdominal: negative for nausea, vomiting, diarrhea, bright red blood per rectum, melena, or hematemesis Neurologic: negative for visual changes, syncope, or dizziness All other systems reviewed and are otherwise negative except as noted above.    Blood pressure 140/80, pulse 89, temperature 98.4 F (36.9 C), height 6\' 4"  (1.93  m), weight (!) 321 lb 6.4 oz (145.8 kg), SpO2 90 %.  General appearance: alert and no distress Neck: no adenopathy, no carotid bruit, no JVD, supple, symmetrical, trachea midline and thyroid not enlarged, symmetric, no tenderness/mass/nodules Lungs: clear to auscultation bilaterally Heart: regular rate and rhythm, S1, S2 normal, no murmur, click, rub or gallop Extremities: extremities normal, atraumatic, no cyanosis or edema Pulses: 2+ and symmetric Skin: Skin color, texture, turgor normal. No rashes or lesions Neurologic: Alert and oriented X 3, normal strength and tone. Normal symmetric reflexes. Normal coordination and gait  EKG sinus rhythm at 89 with left axis deviation and nonspecific ST and T wave changes.  I personally reviewed this EKG.  ASSESSMENT AND PLAN:   Tobacco use History of ongoing tobacco abuse of  1 pack/day recalcitrant to risk factor modification.  Essential hypertension History of essential hypertension blood pressure measured today at 140/80.  He is on losartan and metoprolol.  OSA (obstructive sleep apnea) History of obstructive sleep apnea on CPAP which she benefits from.  NSTEMI (non-ST elevation myocardial infarction) ([place]) History of non-STEMI 02/03/2016 with cardiac catheterization performed the following day revealing a total nondominant circumflex, 70% proximal LAD and mid dominant RCA with an EF of 50 to 55%.  He had inferoapical hypokinesia.  I elected to treat him medically at that time because of the subacute nature of the occlusion.  He has been on dual antiplatelet therapy.  He denies chest pain or shortness of breath  Dyslipidemia, goal LDL below 70 History of dyslipidemia on high-dose statin therapy.  We will recheck a fasting lipid liver profile this morning.      Lorretta Harp MD FACP,FACC,FAHA, [facility] 03/16/2019 10:26 AM

## 2019-03-17 ENCOUNTER — Other Ambulatory Visit: Payer: Self-pay | Admitting: Student

## 2019-03-17 ENCOUNTER — Other Ambulatory Visit: Payer: Self-pay | Admitting: Cardiovascular Disease

## 2019-03-22 NOTE — Telephone Encounter (Signed)
Kathlee Nations, please see pt's mychart message and advise on it for pt. Thanks!

## 2019-05-15 ENCOUNTER — Other Ambulatory Visit: Payer: Self-pay | Admitting: Cardiovascular Disease

## 2019-05-17 NOTE — Telephone Encounter (Signed)
Rx request sent to pharmacy.  

## 2019-07-11 ENCOUNTER — Other Ambulatory Visit: Payer: Self-pay | Admitting: Cardiovascular Disease

## 2019-08-08 ENCOUNTER — Other Ambulatory Visit: Payer: Self-pay | Admitting: Cardiovascular Disease

## 2019-08-09 ENCOUNTER — Other Ambulatory Visit: Payer: Self-pay

## 2019-08-09 MED ORDER — LOSARTAN POTASSIUM 25 MG PO TABS: 25.0000 mg | ORAL_TABLET | Freq: Every day | ORAL | 0 refills | Status: DC

## 2019-09-29 ENCOUNTER — Other Ambulatory Visit: Payer: Self-pay | Admitting: Student

## 2019-09-29 ENCOUNTER — Other Ambulatory Visit: Payer: Self-pay

## 2019-09-29 NOTE — Telephone Encounter (Signed)
This is Dr. Berry's pt 

## 2019-10-05 NOTE — Telephone Encounter (Signed)
Patient checking on status of [facility] refill for Incruse.

## 2019-11-07 ENCOUNTER — Other Ambulatory Visit: Payer: Self-pay | Admitting: Cardiovascular Disease

## 2020-02-01 ENCOUNTER — Other Ambulatory Visit: Payer: Self-pay | Admitting: *Deleted

## 2020-02-01 DIAGNOSIS — F17210 Nicotine dependence, cigarettes, uncomplicated: Secondary | ICD-10-CM

## 2020-02-01 DIAGNOSIS — Z87891 Personal history of nicotine dependence: Secondary | ICD-10-CM

## 2020-02-07 ENCOUNTER — Other Ambulatory Visit: Payer: Self-pay | Admitting: Cardiovascular Disease

## 2020-03-19 ENCOUNTER — Other Ambulatory Visit: Payer: Self-pay | Admitting: Cardiovascular Disease

## 2020-03-28 ENCOUNTER — Ambulatory Visit: Payer: Medicare Other | Admitting: Cardiovascular Disease

## 2020-04-03 ENCOUNTER — Other Ambulatory Visit: Payer: Self-pay | Admitting: Cardiovascular Disease

## 2020-04-26 ENCOUNTER — Other Ambulatory Visit: Payer: Self-pay | Admitting: Cardiovascular Disease

## 2020-04-30 ENCOUNTER — Other Ambulatory Visit: Payer: Self-pay | Admitting: Cardiovascular Disease

## 2020-05-14 ENCOUNTER — Other Ambulatory Visit: Payer: Self-pay | Admitting: Cardiovascular Disease

## 2020-05-25 ENCOUNTER — Encounter: Payer: Self-pay | Admitting: Cardiovascular Disease

## 2020-05-25 ENCOUNTER — Other Ambulatory Visit: Payer: Self-pay

## 2020-05-25 ENCOUNTER — Ambulatory Visit (INDEPENDENT_AMBULATORY_CARE_PROVIDER_SITE_OTHER): Payer: Medicare Other | Admitting: Cardiovascular Disease

## 2020-05-25 VITALS — BP 138/74 | HR 88 | Ht 75.0 in | Wt 318.6 lb

## 2020-05-25 DIAGNOSIS — Z720 Tobacco use: Secondary | ICD-10-CM | POA: Diagnosis not present

## 2020-05-25 DIAGNOSIS — I10 Essential (primary) hypertension: Secondary | ICD-10-CM | POA: Diagnosis not present

## 2020-05-25 DIAGNOSIS — E785 Hyperlipidemia, unspecified: Secondary | ICD-10-CM | POA: Diagnosis not present

## 2020-05-25 DIAGNOSIS — I2510 Atherosclerotic heart disease of native coronary artery without angina pectoris: Secondary | ICD-10-CM

## 2020-05-25 DIAGNOSIS — I214 Non-ST elevation (NSTEMI) myocardial infarction: Secondary | ICD-10-CM | POA: Diagnosis not present

## 2020-05-25 DIAGNOSIS — G4733 Obstructive sleep apnea (adult) (pediatric): Secondary | ICD-10-CM

## 2020-05-25 LAB — LIPID PANEL
Chol/HDL Ratio: 3.2 ratio (ref 0.0–5.0)
Cholesterol, Total: 105 mg/dL (ref 100–199)
HDL: 33 mg/dL — ABNORMAL LOW (ref >39)
LDL Chol Calc (NIH): 51 mg/dL (ref 0–99)
Triglycerides: 114 mg/dL (ref 0–149)
VLDL Cholesterol Cal: 21 mg/dL (ref 5–40)

## 2020-05-25 LAB — HEPATIC FUNCTION PANEL
ALT: 49 IU/L — ABNORMAL HIGH (ref 0–44)
AST: 38 IU/L (ref 0–40)
Albumin: 4.2 g/dL (ref 3.8–4.8)
Alkaline Phosphatase: 139 IU/L — ABNORMAL HIGH (ref 44–121)
Bilirubin Total: 0.6 mg/dL (ref 0.0–1.2)
Bilirubin, Direct: 0.21 mg/dL (ref 0.00–0.40)
Total Protein: 7 g/dL (ref 6.0–8.5)

## 2020-05-25 NOTE — Assessment & Plan Note (Signed)
History of essential hypertension a blood pressure measured today 138/74.  He is on losartan and metoprolol.

## 2020-05-25 NOTE — Progress Notes (Signed)
05/25/2020 John Griffin   [birth year]  [id number]  Primary Physician Christain Sacramento, MD Primary Cardiologist: Lorretta Harp MD [name], [place]  HPI:  John Griffin is a 70 y.o.  moderatelyoverweight married Caucasian male father 2, grandfather to 3 grandchildren whose wife is also patient in mind. He is retired Geophysical data processor.I last saw him in the office  03/16/2019.Marland KitchenHe has a history of discontinued tobacco abuse of 45 pack years and COPD, treated hypertension and hyperlipidemia. He had non-STEMI 6 days prior to admission on 02/03/16 with post-MI angina. I catheterized him on 02/04/16 revealing 100 nondominant circumflex, 70% proximal LAD and mid dominant RCA and EF of 50-55%. There was inferoapical hypokinesia. I elected not to intervene because of the subacute nature of the occlusion.  Since I saw him a year ago he remained stable.  He unfortunately continues to smoke a pack per day recalcitrant to respect modification.  He does have some shortness of breath probably related to COPD.  He denies chest pain.   Current Meds  Medication Sig  . acetaminophen (TYLENOL) 500 MG tablet Take 1,000 mg by mouth every 6 (six) hours as needed for mild pain. Reported on 05/09/2015  . albuterol (PROVENTIL HFA;VENTOLIN HFA) 108 (90 Base) MCG/ACT inhaler Inhale 2 puffs into the lungs every 4 (four) hours as needed for wheezing or shortness of breath.  Marland Kitchen aspirin 81 MG chewable tablet Chew 1 tablet (81 mg total) by mouth daily.  Marland Kitchen atorvastatin (LIPITOR) 80 MG tablet TAKE 1 TABLET BY MOUTH EVERY DAY  . calcium carbonate (TUMS EX) 750 MG chewable tablet Chew 1 tablet by mouth as needed for heartburn.   . cetirizine (ZYRTEC) 10 MG tablet Take 10 mg by mouth daily.   . Chlorpheniramine-DM (CORICIDIN HBP COUGH/COLD PO) Take 1 tablet by mouth at bedtime as needed (congestion).  . Cholecalciferol 50 MCG (2000 UT) CAPS Take 2,000 Units by mouth daily.   . clopidogrel (PLAVIX) 75 MG tablet Take  1 tablet (75 mg total) by mouth daily.  . fluticasone (FLONASE) 50 MCG/ACT nasal spray Place 2 sprays into both nostrils daily.   . furosemide (LASIX) 20 MG tablet TAKE 1 TABLET BY MOUTH EVERY DAY  . isosorbide mononitrate (IMDUR) 30 MG 24 hr tablet TAKE 1 TABLET BY MOUTH EVERY DAY  . KLOR-CON M10 10 MEQ tablet TAKE 1 TABLET BY MOUTH EVERY DAY  . losartan (COZAAR) 25 MG tablet TAKE 1 TABLET (25 MG TOTAL) BY MOUTH DAILY. DUE FOR YEARLY FOLLOW UP WITH CARDIOLOGY  . metFORMIN (GLUCOPHAGE) 500 MG tablet Take by mouth.  . metoprolol tartrate (LOPRESSOR) 25 MG tablet TAKE 1 TABLET BY MOUTH TWICE A DAY  . nitroGLYCERIN (NITROSTAT) 0.4 MG SL tablet Place 1 tablet (0.4 mg total) under the tongue every 5 (five) minutes x 3 doses as needed for chest pain.  Marland Kitchen umeclidinium bromide (INCRUSE ELLIPTA) 62.5 MCG/INH AEPB Inhale 1 puff into the lungs daily.     No Known Allergies  Social History   Socioeconomic History  . Marital status: Married    Spouse name: Not on file  . Number of children: Not on file  . Years of education: Not on file  . Highest education level: Not on file  Occupational History  . Not on file  Tobacco Use  . Smoking status: Current Every Day Smoker    Packs/day: 1.00    Years: 47.00    Pack years: 47.00    Types: Cigarettes  . Smokeless  tobacco: Never Used  . Tobacco comment: back to smoking a 1 ppd since 2018  Vaping Use  . Vaping Use: Never used  Substance and Sexual Activity  . Alcohol use: Yes    Alcohol/week: 0.0 standard drinks    Comment: occasional   . Drug use: No  . Sexual activity: Yes  Other Topics Concern  . 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 [name] with [place]   Social Determinants of Health   Financial Resource Strain: Not on file  Food Insecurity: Not on file  Transportation Needs: Not on file  Physical Activity: Not on file  Stress:  Not on file  Social Connections: Not on file  Intimate Partner Violence: Not on file     Review of Systems: General: negative for chills, fever, night sweats or weight changes.  Cardiovascular: negative for chest pain, dyspnea on exertion, edema, orthopnea, palpitations, paroxysmal nocturnal dyspnea or shortness of breath Dermatological: negative for rash Respiratory: negative for cough or wheezing Urologic: negative for hematuria Abdominal: negative for nausea, vomiting, diarrhea, bright red blood per rectum, melena, or hematemesis Neurologic: negative for visual changes, syncope, or dizziness All other systems reviewed and are otherwise negative except as noted above.    Blood pressure 138/74, pulse 88, height 6\' 3"  (1.905 m), weight (!) 318 lb 9.6 oz (144.5 kg), SpO2 96 %.  General appearance: alert and no distress Neck: no adenopathy, no carotid bruit, no JVD, supple, symmetrical, trachea midline and thyroid not enlarged, symmetric, no tenderness/mass/nodules Lungs: clear to auscultation bilaterally Heart: regular rate and rhythm, S1, S2 normal, no murmur, click, rub or gallop Extremities: extremities normal, atraumatic, no cyanosis or edema Pulses: 2+ and symmetric Skin: Skin color, texture, turgor normal. No rashes or lesions Neurologic: Alert and oriented X 3, normal strength and tone. Normal symmetric reflexes. Normal coordination and gait  EKG sinus rhythm 88 with left anterior fascicular block and nonspecific ST and T wave changes.  I personally reviewed this EKG.  ASSESSMENT AND PLAN:   Tobacco use History of ongoing tobacco use 1 pack/day with COPD, recalcitrant respiratory modification.  Essential hypertension History of essential hypertension a blood pressure measured today 138/74.  He is on losartan and metoprolol.  OSA (obstructive sleep apnea) History of obstructive sleep apnea on CPAP  Dyslipidemia, goal LDL below 70 History of dyslipidemia on statin therapy  with lipid profile performed 03/16/2019 revealing total cholesterol of 103, LDL 54 and HDL of 30.  We will recheck a lipid and liver profile.  CAD (coronary artery disease) History of CAD status post non-STEMI 02/04/2016.  This revealed a total nondominant circumflex, 70% proximal LAD and mid dominant RCA with an EF of 50 to 55%.  There was inferoapical hypokinesia.  Because the event happened 6 days prior to his heart cath and he was asymptomatic I elected not to intervene but to treat him medically.  He said no recurrent symptoms.      Lorretta Harp MD [place], [facility] 05/25/2020 9:55 AM

## 2020-05-25 NOTE — Patient Instructions (Signed)

## 2020-05-25 NOTE — Assessment & Plan Note (Signed)
History of CAD status post non-STEMI 02/04/2016.  This revealed a total nondominant circumflex, 70% proximal LAD and mid dominant RCA with an EF of 50 to 55%.  There was inferoapical hypokinesia.  Because the event happened 6 days prior to his heart cath and he was asymptomatic I elected not to intervene but to treat him medically.  He said no recurrent symptoms.

## 2020-05-25 NOTE — Assessment & Plan Note (Signed)
History of ongoing tobacco use 1 pack/day with COPD, recalcitrant respiratory modification.

## 2020-05-25 NOTE — Assessment & Plan Note (Signed)
History of obstructive sleep apnea on CPAP. 

## 2020-05-25 NOTE — Assessment & Plan Note (Signed)
History of dyslipidemia on statin therapy with lipid profile performed 03/16/2019 revealing total cholesterol of 103, LDL 54 and HDL of 30.  We will recheck a lipid and liver profile.

## 2020-05-29 ENCOUNTER — Other Ambulatory Visit: Payer: Self-pay | Admitting: Cardiovascular Disease

## 2020-06-05 ENCOUNTER — Other Ambulatory Visit: Payer: Self-pay | Admitting: Cardiovascular Disease

## 2020-06-06 ENCOUNTER — Other Ambulatory Visit: Payer: Self-pay

## 2020-06-06 MED ORDER — POTASSIUM CHLORIDE CRYS ER 10 MEQ PO TBCR: 10.0000 meq | EXTENDED_RELEASE_TABLET | Freq: Every day | ORAL | 3 refills | Status: DC

## 2020-06-06 MED ORDER — FUROSEMIDE 20 MG PO TABS: 20.0000 mg | ORAL_TABLET | Freq: Every day | ORAL | 3 refills | Status: DC

## 2020-06-06 MED ORDER — ISOSORBIDE MONONITRATE ER 30 MG PO TB24: 30.0000 mg | ORAL_TABLET | Freq: Every day | ORAL | 3 refills | Status: DC

## 2020-06-20 ENCOUNTER — Other Ambulatory Visit: Payer: Self-pay | Admitting: Cardiovascular Disease

## 2020-06-28 ENCOUNTER — Other Ambulatory Visit: Payer: Self-pay | Admitting: Cardiovascular Disease

## 2020-06-29 ENCOUNTER — Other Ambulatory Visit: Payer: Self-pay | Admitting: Cardiovascular Disease

## 2020-09-12 ENCOUNTER — Other Ambulatory Visit: Payer: Self-pay | Admitting: Cardiovascular Disease

## 2020-11-27 ENCOUNTER — Ambulatory Visit (INDEPENDENT_AMBULATORY_CARE_PROVIDER_SITE_OTHER): Payer: Medicare Other | Admitting: Adult Health

## 2020-11-27 ENCOUNTER — Encounter: Payer: Self-pay | Admitting: Adult Health

## 2020-11-27 ENCOUNTER — Other Ambulatory Visit: Payer: Self-pay

## 2020-11-27 VITALS — BP 138/74 | HR 97 | Temp 97.9°F | Ht 76.0 in | Wt 314.0 lb

## 2020-11-27 DIAGNOSIS — F17210 Nicotine dependence, cigarettes, uncomplicated: Secondary | ICD-10-CM

## 2020-11-27 DIAGNOSIS — J449 Chronic obstructive pulmonary disease, unspecified: Secondary | ICD-10-CM | POA: Diagnosis not present

## 2020-11-27 DIAGNOSIS — I2510 Atherosclerotic heart disease of native coronary artery without angina pectoris: Secondary | ICD-10-CM

## 2020-11-27 DIAGNOSIS — Z720 Tobacco use: Secondary | ICD-10-CM

## 2020-11-27 DIAGNOSIS — G4733 Obstructive sleep apnea (adult) (pediatric): Secondary | ICD-10-CM

## 2020-11-27 MED ORDER — ALBUTEROL SULFATE HFA 108 (90 BASE) MCG/ACT IN AERS: 2.0000 | INHALATION_SPRAY | RESPIRATORY_TRACT | 11 refills | Status: DC | PRN

## 2020-11-27 MED ORDER — ALBUTEROL SULFATE HFA 108 (90 BASE) MCG/ACT IN AERS: 2.0000 | INHALATION_SPRAY | RESPIRATORY_TRACT | 4 refills | Status: DC | PRN

## 2020-11-27 NOTE — Assessment & Plan Note (Signed)
Moderate to severe COPD-currently compensated.  Unfortunately patient is unable to afford Spiriva.  We discussed restarting this and trying to get through patient assistance.  Patient says he did not notice any difference since he has been off of it.  And prefers to stay off of the inhaler right now.  Patient uses albuterol less than once a week. Do recommend that he restart the low-dose CT screening program.  Smoking cessation was discussed in detail  Plan  Patient Instructions  Order for new CPAP machine .  Continue on CPAP At bedtime. Keep up good work.  Saline nasal rinses Twice daily  As needed   Saline nasal gel At bedtime  .  Work on healthy weight  Do not drive if sleepy.  Albuterol inhaler As needed   Work on not smoking .  Refer back to LDCT chest screening program.  Follow up with Dr. Elsworth Soho  In 6 months and As needed.

## 2020-11-27 NOTE — Assessment & Plan Note (Signed)
Excellent control and compliance.  Unfortunately patient's CPAP machine has broken.  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  Order for new CPAP has been sent to his DME   Plan  Patient Instructions  Order for new CPAP machine .  Continue on CPAP At bedtime. Keep up good work.  Saline nasal rinses Twice daily  As needed   Saline nasal gel At bedtime  .  Work on healthy weight  Do not drive if sleepy.  Albuterol inhaler As needed   Work on not smoking .  Refer back to LDCT chest screening program.  Follow up with Dr. Elsworth Soho  In 6 months and As needed.

## 2020-11-27 NOTE — Assessment & Plan Note (Signed)
Smoking cessation discussed in detail.  Referral to the low-dose CT screening program

## 2020-11-27 NOTE — Patient Instructions (Signed)
Order for new CPAP machine .  Continue on CPAP At bedtime. Keep up good work.  Saline nasal rinses Twice daily  As needed   Saline nasal gel At bedtime  .  Work on healthy weight  Do not drive if sleepy.  Albuterol inhaler As needed   Work on not smoking .  Refer back to LDCT chest screening program.  Follow up with Dr. Elsworth Soho  In 6 months and As needed.

## 2020-11-27 NOTE — Progress Notes (Signed)
@Patient  ID: John Griffin, male    DOB: [birth year], 70 y.o.   MRN: [id number]  Chief Complaint  Patient presents with   Follow-up    Referring provider: Christain Sacramento, MD  HPI: 70 yo male active smoker followed for COPD and OSA  Medical history significant for Diastolic CHF , DM ,   TEST/EVENTS :  Spirometry 11/2017 shows drop in FEV1 to 50% with ratio 82 PFT (04/2015)  FEV1  2.30  56%.  DLCO 66% HST (06/2015)  AHI 44  11/27/2020 Follow up: COPD and OSA  Patient returns for a follow-up visit.  Last seen July 2020.  Patient has underlying moderate to severe COPD.  Previously on Spiriva.  Patient got Spiriva through the medication assistance program.  Patient says during the pandemic he ran out of his prescription and was unable to get approval for the Spiriva.  He has been off of Spiriva for 1.5 years.  Patient says he is really not noticed any difference in his breathing since stopping Spiriva.  He has had no COPD exacerbations.  Been on no antibiotics or prednisone since seen over 2 years ago.  Patient does continue to smoke.  We discussed smoking cessation.  Patient says he will try to cut back but is not ready to quit yet.  Patient previously participated in the low-dose CT screening program.  He missed his CT this year.  We discussed restarting this and he is agreeable. Patient says he is sedentary.  We discussed activity as tolerated.  Patient's COVID-vaccine/booster and flu shot are up-to-date.  Patient says he has been very careful during the pandemic.  Has not had COVID-19 infection. Patient lives at home with his wife.  Is able to drive.  Does light activities but gets short of breath with prolonged walking.   Patient has underlying severe sleep apnea.  On nocturnal CPAP.  Patient says he wears a CPAP every single night cannot go without his CPAP.  Unfortunately his CPAP broke a few days ago.  He did get in touch with his homecare company.  And they have given him a loaner but he  needs an order for new CPAP machine.  Patient wears his CPAP on average 10 hours each night.  Patient is on CPAP 10 to 15 cm H2O.   No Known Allergies  Immunization History  Administered Date(s) Administered   Influenza, High Dose Seasonal PF 10/24/2015, 02/23/2017, 12/04/2017   Influenza,inj,Quad PF,6+ Mos 02/28/2015   Pneumococcal Conjugate-13 02/25/2016   Pneumococcal Polysaccharide-23 02/28/2015    Past Medical History:  Diagnosis Date   Arthritis    hands  and shoulders   CAD (coronary artery disease)    a. 01/2016: NSTEMI with cath showing 100% Prox Cx stenosis, 70% Prox LAD, 75% 1st Diag, and 60-70% RCA stenosis with medical therapy pursued as the LCx had collateral flow noted   COPD (chronic obstructive pulmonary disease) (HCC)    Dyspnea    Hypertension    Myocardial infarction ([place])    01/2016   Pneumonia    Prostate cancer ([place])    s/p prostatectomy   Sleep apnea    cpap   Tobacco use    Umbilical hernia    Urinary frequency     Tobacco History: Social History   Tobacco Use  Smoking Status Every Day   Packs/day: 1.00   Years: 47.00   Pack years: 47.00   Types: Cigarettes  Smokeless Tobacco Never  Tobacco Comments   back to smoking  a 1 ppd since 2018   Ready to quit: No Counseling given: Yes Tobacco comments: back to smoking a 1 ppd since 2018   Outpatient Medications Prior to Visit  Medication Sig Dispense Refill   acetaminophen (TYLENOL) 500 MG tablet Take 1,000 mg by mouth every 6 (six) hours as needed for mild pain. Reported on 05/09/2015     aspirin 81 MG chewable tablet Chew 1 tablet (81 mg total) by mouth daily.     atorvastatin (LIPITOR) 80 MG tablet TAKE 1 TABLET BY MOUTH EVERY DAY 90 tablet 2   calcium carbonate (TUMS EX) 750 MG chewable tablet Chew 1 tablet by mouth as needed for heartburn.      cetirizine (ZYRTEC) 10 MG tablet Take 10 mg by mouth daily.      Chlorpheniramine-DM (CORICIDIN HBP COUGH/COLD PO) Take 1 tablet by mouth at  bedtime as needed (congestion).     Cholecalciferol 50 MCG (2000 UT) CAPS Take 2,000 Units by mouth daily.      clopidogrel (PLAVIX) 75 MG tablet TAKE 1 TABLET BY MOUTH EVERY DAY 90 tablet 0   fluticasone (FLONASE) 50 MCG/ACT nasal spray Place 2 sprays into both nostrils daily.      furosemide (LASIX) 20 MG tablet Take 1 tablet (20 mg total) by mouth daily. 90 tablet 3   isosorbide mononitrate (IMDUR) 30 MG 24 hr tablet Take 1 tablet (30 mg total) by mouth daily. 90 tablet 3   losartan (COZAAR) 25 MG tablet TAKE 1 TABLET (25 MG TOTAL) BY MOUTH DAILY. DUE FOR YEARLY FOLLOW UP WITH CARDIOLOGY 90 tablet 2   metFORMIN (GLUCOPHAGE) 500 MG tablet Take by mouth.     metoprolol tartrate (LOPRESSOR) 25 MG tablet TAKE 1 TABLET BY MOUTH TWICE A DAY 180 tablet 2   nitroGLYCERIN (NITROSTAT) 0.4 MG SL tablet Place 1 tablet (0.4 mg total) under the tongue every 5 (five) minutes x 3 doses as needed for chest pain. 25 tablet 2   potassium chloride (KLOR-CON M10) 10 MEQ tablet Take 1 tablet (10 mEq total) by mouth daily. 90 tablet 3   albuterol (PROVENTIL HFA;VENTOLIN HFA) 108 (90 Base) MCG/ACT inhaler Inhale 2 puffs into the lungs every 4 (four) hours as needed for wheezing or shortness of breath. 18 g 3   umeclidinium bromide (INCRUSE ELLIPTA) 62.5 MCG/INH AEPB Inhale 1 puff into the lungs daily. (Patient not taking: Reported on 11/27/2020) 3 each 3   No facility-administered medications prior to visit.     Review of Systems:   Constitutional:   No  weight loss, night sweats,  Fevers, chills,  +fatigue, or  lassitude.  HEENT:   No headaches,  Difficulty swallowing,  Tooth/dental problems, or  Sore throat,                No sneezing, itching, ear ache, nasal congestion, post nasal drip,   CV:  No chest pain,  Orthopnea, PND, swelling in lower extremities, anasarca, dizziness, palpitations, syncope.   GI  No heartburn, indigestion, abdominal pain, nausea, vomiting, diarrhea, change in bowel habits, loss of  appetite, bloody stools.   Resp:   No excess mucus, no productive cough,  No non-productive cough,  No coughing up of blood.  No change in color of mucus.  No wheezing.  No chest wall deformity  Skin: no rash or lesions.  GU: no dysuria, change in color of urine, no urgency or frequency.  No flank pain, no hematuria   MS:  No joint pain or swelling.  No decreased range of motion.  No back pain.    Physical Exam  BP 138/74 (BP Location: Left Arm, Patient Position: Sitting, Cuff Size: Normal)   Pulse 97   Temp 97.9 F (36.6 C) (Oral)   Ht 6\' 4"  (1.93 m)   Wt (!) 314 lb (142.4 kg)   SpO2 95%   BMI 38.22 kg/m   GEN: A/Ox3; pleasant , NAD, well nourished    HEENT:  [place]/AT,   NOSE-clear, THROAT-clear, no lesions, no postnasal drip or exudate noted.   NECK:  Supple w/ fair ROM; no JVD; normal carotid impulses w/o bruits; no thyromegaly or nodules palpated; no lymphadenopathy.    RESP  Clear  P & A; w/o, wheezes/ rales/ or rhonchi. no accessory muscle use, no dullness to percussion  CARD:  RRR, no m/r/g, tr  peripheral edema, pulses intact, no cyanosis or clubbing.  GI:   Soft & nt; nml bowel sounds; no organomegaly or masses detected.   Musco: Warm bil, no deformities or joint swelling noted.   Neuro: alert, no focal deficits noted.    Skin: Warm, no lesions or rashes    Lab Results:  CBC      ProBNP No results found for: PROBNP  Imaging: No results found.    PFT Results Latest Ref Rng & Units 05/03/2015  FVC-Predicted Pre % 54  FVC-Post L 3.21  FVC-Predicted Post % 58  Pre FEV1/FVC % % 77  Post FEV1/FCV % % 73  FEV1-Pre L 2.30  FEV1-Predicted Pre % 56  FEV1-Post L 2.33  DLCO uncorrected ml/min/mmHg 25.84  DLCO UNC% % 66  DLCO corrected ml/min/mmHg 25.99  DLCO COR %Predicted % 66  DLVA Predicted % 98    No results found for: NITRICOXIDE      Assessment & Plan:   COPD (chronic obstructive pulmonary disease) (HCC) Moderate to severe  COPD-currently compensated.  Unfortunately patient is unable to afford Spiriva.  We discussed restarting this and trying to get through patient assistance.  Patient says he did not notice any difference since he has been off of it.  And prefers to stay off of the inhaler right now.  Patient uses albuterol less than once a week. Do recommend that he restart the low-dose CT screening program.  Smoking cessation was discussed in detail  Plan  Patient Instructions  Order for new CPAP machine .  Continue on CPAP At bedtime. Keep up good work.  Saline nasal rinses Twice daily  As needed   Saline nasal gel At bedtime  .  Work on healthy weight  Do not drive if sleepy.  Albuterol inhaler As needed   Work on not smoking .  Refer back to LDCT chest screening program.  Follow up with Dr. Elsworth Soho  In 6 months and As needed.        OSA (obstructive sleep apnea) Excellent control and compliance.  Unfortunately patient's CPAP machine has broken.  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  Order for new CPAP has been sent to his DME   Plan  Patient Instructions  Order for new CPAP machine .  Continue on CPAP At bedtime. Keep up good work.  Saline nasal rinses Twice daily  As needed   Saline nasal gel At bedtime  .  Work on healthy weight  Do not drive if sleepy.  Albuterol inhaler As needed   Work on not smoking .  Refer back to LDCT chest screening program.  Follow up  with Dr. Elsworth Soho  In 6 months and As needed.        Tobacco use Smoking cessation discussed in detail.  Referral to the low-dose CT screening program     Rexene Edison, NP 11/27/2020

## 2020-12-07 ENCOUNTER — Other Ambulatory Visit: Payer: Self-pay | Admitting: Cardiovascular Disease

## 2021-03-07 ENCOUNTER — Other Ambulatory Visit: Payer: Self-pay | Admitting: Cardiovascular Disease

## 2021-03-20 ENCOUNTER — Other Ambulatory Visit: Payer: Self-pay | Admitting: Cardiovascular Disease

## 2021-06-04 ENCOUNTER — Encounter: Payer: Self-pay | Admitting: Cardiovascular Disease

## 2021-06-04 ENCOUNTER — Ambulatory Visit (INDEPENDENT_AMBULATORY_CARE_PROVIDER_SITE_OTHER): Payer: Medicare Other | Admitting: Cardiovascular Disease

## 2021-06-04 VITALS — BP 124/78 | HR 99 | Ht 76.0 in | Wt 315.0 lb

## 2021-06-04 DIAGNOSIS — E785 Hyperlipidemia, unspecified: Secondary | ICD-10-CM | POA: Diagnosis not present

## 2021-06-04 DIAGNOSIS — I10 Essential (primary) hypertension: Secondary | ICD-10-CM | POA: Diagnosis not present

## 2021-06-04 DIAGNOSIS — G4733 Obstructive sleep apnea (adult) (pediatric): Secondary | ICD-10-CM

## 2021-06-04 DIAGNOSIS — I214 Non-ST elevation (NSTEMI) myocardial infarction: Secondary | ICD-10-CM | POA: Diagnosis not present

## 2021-06-04 DIAGNOSIS — Z720 Tobacco use: Secondary | ICD-10-CM | POA: Diagnosis not present

## 2021-06-04 DIAGNOSIS — I2510 Atherosclerotic heart disease of native coronary artery without angina pectoris: Secondary | ICD-10-CM

## 2021-06-04 LAB — LIPID PANEL
Chol/HDL Ratio: 3.1 ratio (ref 0.0–5.0)
Cholesterol, Total: 104 mg/dL (ref 100–199)
HDL: 34 mg/dL — ABNORMAL LOW (ref >39)
LDL Chol Calc (NIH): 51 mg/dL (ref 0–99)
Triglycerides: 98 mg/dL (ref 0–149)
VLDL Cholesterol Cal: 19 mg/dL (ref 5–40)

## 2021-06-04 LAB — HEPATIC FUNCTION PANEL
ALT: 44 IU/L (ref 0–44)
AST: 39 IU/L (ref 0–40)
Albumin: 4 g/dL (ref 3.7–4.7)
Alkaline Phosphatase: 142 IU/L — ABNORMAL HIGH (ref 44–121)
Bilirubin Total: 0.5 mg/dL (ref 0.0–1.2)
Bilirubin, Direct: 0.17 mg/dL (ref 0.00–0.40)
Total Protein: 6.8 g/dL (ref 6.0–8.5)

## 2021-06-04 NOTE — Patient Instructions (Signed)
Medication Instructions:  ?Your physician recommends that you continue on your current medications as directed. Please refer to the Current Medication list given to you today. ? ?*If you need a refill on your cardiac medications before your next appointment, please call your pharmacy* ? ? ?Lab Work: ?Your physician recommends that you have labs drawn today: lipid and liver profile ? ?If you have labs (blood work) drawn today and your tests are completely normal, you will receive your results only by: ?MyChart Message (if you have MyChart) OR ?A paper copy in the mail ?If you have any lab test that is abnormal or we need to change your treatment, we will call you to review the results. ? ? ?Follow-Up: ?At [facility], you and your health needs are our priority.  As part of our continuing mission to provide you with exceptional heart care, we have created designated Provider Care Teams.  These Care Teams include your primary Cardiologist (physician) and Advanced Practice Providers (APPs -  Physician Assistants and Nurse Practitioners) who all work together to provide you with the care you need, when you need it. ? ?We recommend signing up for the patient portal called "MyChart".  Sign up information is provided on this After Visit Summary.  MyChart is used to connect with patients for Virtual Visits (Telemedicine).  Patients are able to view lab/test results, encounter notes, upcoming appointments, etc.  Non-urgent messages can be sent to your provider as well.   ?To learn more about what you can do with MyChart, go to [email or web address].   ? ?Your next appointment:   ?12 month(s) ? ?The format for your next appointment:   ?In Person ? ?Provider:   ?Quay Burow, MD ?

## 2021-06-04 NOTE — Progress Notes (Signed)
? ? ? ?06/04/2021 ?John Griffin   ?1950/06/04  ?[id number] ? ?Primary Physician John Sacramento, MD ?Primary Cardiologist: Lorretta Harp MD John Griffin, [place] ? ?HPI:  John Griffin is a 71 y.o.  moderately overweight married Caucasian male father 2, grandfather to 3 grandchildren whose wife John Griffin is also patient in mind. He is retired Geophysical data processor. I last saw him in the office   05/25/2020.Marland Kitchen He has a history of discontinued tobacco abuse of 45 pack years and COPD, treated hypertension and hyperlipidemia. He had non-STEMI 6 days prior to admission on 02/03/16 with post-MI angina. I catheterized him on 02/04/16 revealing 100 nondominant circumflex, 70% proximal LAD and mid dominant RCA and EF of 50-55%. There was inferoapical hypokinesia. I elected not to intervene because of the subacute nature of the occlusion.  ?  ?Since I saw him a year ago he remained stable.  He unfortunately continues to smoke a pack per day recalcitrant to respect modification.  He does have some shortness of breath probably related to COPD.  He denies chest pain. ? ? ?Current Meds  ?Medication Sig  ? acetaminophen (TYLENOL) 500 MG tablet Take 1,000 mg by mouth every 6 (six) hours as needed for mild pain. Reported on 05/09/2015  ? albuterol (VENTOLIN HFA) 108 (90 Base) MCG/ACT inhaler Inhale 2 puffs into the lungs every 4 (four) hours as needed for wheezing or shortness of breath.  ? aspirin 81 MG chewable tablet Chew 1 tablet (81 mg total) by mouth daily.  ? atorvastatin (LIPITOR) 80 MG tablet TAKE 1 TABLET BY MOUTH EVERY DAY  ? calcium carbonate (TUMS EX) 750 MG chewable tablet Chew 1 tablet by mouth as needed for heartburn.   ? cetirizine (ZYRTEC) 10 MG tablet Take 10 mg by mouth daily.   ? Chlorpheniramine-DM (CORICIDIN HBP COUGH/COLD PO) Take 1 tablet by mouth at bedtime as needed (congestion).  ? Cholecalciferol 50 MCG (2000 UT) CAPS Take 2,000 Units by mouth daily.   ? clopidogrel (PLAVIX) 75 MG tablet TAKE 1 TABLET BY MOUTH  EVERY DAY  ? fluticasone (FLONASE) 50 MCG/ACT nasal spray Place 2 sprays into both nostrils daily.   ? furosemide (LASIX) 20 MG tablet Take 1 tablet (20 mg total) by mouth daily.  ? isosorbide mononitrate (IMDUR) 30 MG 24 hr tablet Take 1 tablet (30 mg total) by mouth daily.  ? losartan (COZAAR) 25 MG tablet TAKE 1 TABLET (25 MG TOTAL) BY MOUTH DAILY. DUE FOR YEARLY FOLLOW UP WITH CARDIOLOGY  ? metFORMIN (GLUCOPHAGE) 500 MG tablet Take by mouth.  ? metoprolol tartrate (LOPRESSOR) 25 MG tablet TAKE 1 TABLET BY MOUTH TWICE A DAY  ? nitroGLYCERIN (NITROSTAT) 0.4 MG SL tablet Place 1 tablet (0.4 mg total) under the tongue every 5 (five) minutes x 3 doses as needed for chest pain.  ? potassium chloride (KLOR-CON M10) 10 MEQ tablet Take 1 tablet (10 mEq total) by mouth daily.  ?  ? ?No Known Allergies ? ?Social History  ? ?Socioeconomic History  ? Marital status: Married  ?  Spouse name: Not on file  ? Number of children: Not on file  ? Years of education: Not on file  ? Highest education level: Not on file  ?Occupational History  ? Not on file  ?Tobacco Use  ? Smoking status: Every Day  ?  Packs/day: 1.00  ?  Years: 47.00  ?  Pack years: 47.00  ?  Types: Cigarettes  ? Smokeless tobacco: Never  ? Tobacco comments:  ?  back to smoking a 1 ppd since 2018  ?Vaping Use  ? Vaping Use: Never used  ?Substance and Sexual Activity  ? Alcohol use: Yes  ?  Alcohol/week: 0.0 standard drinks  ?  Comment: occasional   ? Drug use: No  ? Sexual activity: Yes  ?Other Topics Concern  ? Not on file  ?Social History Narrative  ? 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 Married  ? Wife sees Dr. [name] with [facility]  ? ?Social Determinants of Health  ? ?Financial Resource Strain: Not on file  ?Food Insecurity: Not on file  ?Transportation Needs: Not on file  ?Physical Activity: Not on file  ?Stress: Not on file  ?Social Connections: Not on file  ?Intimate Partner Violence: Not on file  ?  ? ?Review of  Systems: ?General: negative for chills, fever, night sweats or weight changes.  ?Cardiovascular: negative for chest pain, dyspnea on exertion, edema, orthopnea, palpitations, paroxysmal nocturnal dyspnea or shortness of breath ?Dermatological: negative for rash ?Respiratory: negative for cough or wheezing ?Urologic: negative for hematuria ?Abdominal: negative for nausea, vomiting, diarrhea, bright red blood per rectum, melena, or hematemesis ?Neurologic: negative for visual changes, syncope, or dizziness ?All other systems reviewed and are otherwise negative except as noted above. ? ? ? ?Blood pressure 124/78, Griffin 99, height '6\' 4"'$  (1.93 m), weight (!) 315 lb (142.9 kg).  ?General appearance: alert and no distress ?Neck: no adenopathy, no carotid bruit, no JVD, supple, symmetrical, trachea midline, and thyroid not enlarged, symmetric, no tenderness/mass/nodules ?Lungs: clear to auscultation bilaterally ?Heart: regular rate and rhythm, S1, S2 normal, no murmur, click, rub or gallop ?Extremities: extremities normal, atraumatic, no cyanosis or edema ?Pulses: 2+ and symmetric ?Skin: Skin color, texture, turgor normal. No rashes or lesions ?Neurologic: Grossly normal ? ?EKG sinus rhythm at 99 with left anterior fascicular block.  I personally reviewed this EKG. ? ?ASSESSMENT AND PLAN:  ? ?Tobacco use ?Ongoing tobacco abuse of 1 pack/day recalcitrant to risk factor modification. ? ?Essential hypertension ?History of essential hypertension a blood pressure measured today at 124/78.  He is on metoprolol. ? ?OSA (obstructive sleep apnea) ?History of obstructive sleep apnea on CPAP ? ?NSTEMI (non-ST elevation myocardial infarction) ([place]) ?History of CAD status post non-STEMI 02/03/2016 with catheterization by myself 02/04/2016 revealing 100% nondominant circumflex, 70% proximal LAD and mid dominant artery CA stenosis with an EF of 50 to 55%.  He had inferoapical hypokinesia.  I elected not to intervene because of the subacute  nature of the occlusion but had rather treat him medically.  He has been stable since. ? ?Dyslipidemia, goal LDL below 70 ?History of dyslipidemia on statin therapy with lipid profile performed 05/25/2020 revealing total cholesterol 105, LDL 51 and HDL 33. ? ? ? ? ?Lorretta Harp MD FACP,FACC,FAHA, FSCAI ?06/04/2021 ?8:44 AM ?

## 2021-06-04 NOTE — Assessment & Plan Note (Addendum)
History of obstructive sleep apnea on CPAP. 

## 2021-06-04 NOTE — Assessment & Plan Note (Signed)
Ongoing tobacco abuse of 1 pack/day recalcitrant to risk factor modification. 

## 2021-06-04 NOTE — Assessment & Plan Note (Signed)
History of CAD status post non-STEMI 02/03/2016 with catheterization by myself 02/04/2016 revealing 100% nondominant circumflex, 70% proximal LAD and mid dominant artery CA stenosis with an EF of 50 to 55%.  He had inferoapical hypokinesia.  I elected not to intervene because of the subacute nature of the occlusion but had rather treat him medically.  He has been stable since. ?

## 2021-06-04 NOTE — Assessment & Plan Note (Signed)
History of dyslipidemia on statin therapy with lipid profile performed 05/25/2020 revealing total cholesterol 105, LDL 51 and HDL 33. ?

## 2021-06-04 NOTE — Assessment & Plan Note (Signed)
History of essential hypertension a blood pressure measured today at 124/78.  He is on metoprolol. ?

## 2021-06-09 ENCOUNTER — Other Ambulatory Visit: Payer: Self-pay | Admitting: Cardiovascular Disease

## 2021-06-27 DIAGNOSIS — G4733 Obstructive sleep apnea (adult) (pediatric): Secondary | ICD-10-CM

## 2021-07-18 ENCOUNTER — Other Ambulatory Visit: Payer: Self-pay | Admitting: Gastroenterology

## 2021-07-26 ENCOUNTER — Telehealth: Payer: Self-pay | Admitting: Adult Health

## 2021-07-26 ENCOUNTER — Other Ambulatory Visit: Payer: Self-pay | Admitting: *Deleted

## 2021-07-26 DIAGNOSIS — G4733 Obstructive sleep apnea (adult) (pediatric): Secondary | ICD-10-CM

## 2021-07-26 NOTE — Telephone Encounter (Signed)
Order for new CPAP machine was placed 12/07/2020 to [facility].  Copied and pasted initial order and put in new Amb DME order again.  Routing to [facility] to send office visit notes per [facility] request.  ATC Jamie at [facility].  Line rang.  No answer.  No VM.  [facility]: The order for the CPAP was placed to [facility] back in November of 2022.  I placed a new order.  Can you please follow up and see what happened with the initial order?  Please also fax office visit notes to [facility].  Thank you.

## 2021-07-30 NOTE — Telephone Encounter (Signed)
John Griffin - can you please check on this one?

## 2021-07-31 NOTE — Telephone Encounter (Signed)
Called [facility] and was on hold for 6 minutes.  Will call back.

## 2021-08-01 NOTE — Telephone Encounter (Signed)
Called [facility] and spoke to Karna Christmas - I asked about Nov order and she states due to backlog some orders were cancelled out - she isn't sure if that is what happened with this one.  She transferred me to repap dept because that is who had notes on the acct.  Spoke to [place].  She states they have all they need - there was some confusion on order that was faxed a few days ago due so sign/cosign for provider.  She states now they do have all they need.  Nothing further needed.

## 2021-08-02 ENCOUNTER — Telehealth: Payer: Self-pay | Admitting: Adult Health

## 2021-08-02 NOTE — Telephone Encounter (Signed)
[id number] States she didnt fill in information for order for humidifier. States she can send a new one or Tammy can just ammed the old one herself. See encounter from 7/7. Jamie's phone heavily broke up on this call, and this was the most I could piece together. Please advise.

## 2021-08-02 NOTE — Telephone Encounter (Signed)
Janie from [facility] states does not need any information from our office.

## 2021-08-02 NOTE — Telephone Encounter (Signed)
Called John Griffin from [facility] to confirm if she needed anything from our office regarding this patient. Roselyn Reef voiced no issues with the patient and that she did not require anything from [place] at this time. Nothing further needed

## 2021-09-10 ENCOUNTER — Encounter: Payer: Self-pay | Admitting: Cardiovascular Disease

## 2021-09-10 MED ORDER — METOPROLOL TARTRATE 25 MG PO TABS: 25.0000 mg | ORAL_TABLET | Freq: Two times a day (BID) | ORAL | 2 refills | Status: DC

## 2021-09-14 ENCOUNTER — Other Ambulatory Visit: Payer: Self-pay | Admitting: Cardiovascular Disease

## 2021-09-16 ENCOUNTER — Telehealth: Payer: Self-pay | Admitting: Acute Care

## 2021-09-16 ENCOUNTER — Other Ambulatory Visit: Payer: Self-pay | Admitting: *Deleted

## 2021-09-16 DIAGNOSIS — Z87891 Personal history of nicotine dependence: Secondary | ICD-10-CM

## 2021-09-16 DIAGNOSIS — Z122 Encounter for screening for malignant neoplasm of respiratory organs: Secondary | ICD-10-CM

## 2021-09-16 DIAGNOSIS — F17210 Nicotine dependence, cigarettes, uncomplicated: Secondary | ICD-10-CM

## 2021-09-16 DIAGNOSIS — Z720 Tobacco use: Secondary | ICD-10-CM

## 2021-09-16 NOTE — Telephone Encounter (Signed)
Contacted patient by phone to schedule annual LDCT.  Patient states he wanted to talk with T. Parrett NP about it at his appt.  He didn't recall having to do this each each and did not wish to schedule it today.  New order placed for LDCT

## 2021-09-17 ENCOUNTER — Ambulatory Visit (INDEPENDENT_AMBULATORY_CARE_PROVIDER_SITE_OTHER): Payer: Medicare Other | Admitting: Adult Health

## 2021-09-17 ENCOUNTER — Ambulatory Visit (INDEPENDENT_AMBULATORY_CARE_PROVIDER_SITE_OTHER): Payer: Medicare Other

## 2021-09-17 ENCOUNTER — Encounter: Payer: Self-pay | Admitting: Adult Health

## 2021-09-17 VITALS — BP 138/62 | HR 88 | Temp 98.5°F | Ht 76.0 in | Wt 324.0 lb

## 2021-09-17 DIAGNOSIS — Z720 Tobacco use: Secondary | ICD-10-CM

## 2021-09-17 DIAGNOSIS — J449 Chronic obstructive pulmonary disease, unspecified: Secondary | ICD-10-CM | POA: Diagnosis not present

## 2021-09-17 DIAGNOSIS — I2510 Atherosclerotic heart disease of native coronary artery without angina pectoris: Secondary | ICD-10-CM | POA: Diagnosis not present

## 2021-09-17 DIAGNOSIS — G4733 Obstructive sleep apnea (adult) (pediatric): Secondary | ICD-10-CM | POA: Diagnosis not present

## 2021-09-17 NOTE — Assessment & Plan Note (Signed)
Reported excellent control and compliance on CPAP.  Patient has received a new CPAP machine.  Says it is working very well.  He has significant perceived benefit.  CPAP download is been requested.  Unfortunately the [facility] system is having technical issues and unable to do a remote download.  Plan  Patient Instructions  Continue on CPAP At bedtime. Keep up good work.  Chest xray today   Saline nasal rinses Twice daily  As needed   Saline nasal gel At bedtime  .  Work on healthy weight  Do not drive if sleepy.  Albuterol inhaler As needed   Work on not smoking .  Follow up with Dr. Elsworth Soho  In 6 months and As needed.

## 2021-09-17 NOTE — Assessment & Plan Note (Signed)
Moderate to severe COPD currently at baseline.  Patient says overall he is doing well.  Does not want to start on a maintenance regimen.  Patient is advised if he has to use increased albuterol to contact us.  We can look at additional medications as cost of medications is also been a barrier for him before.  He has tried Spiriva, Symbicort and Dulera in the past patient declines referral again to the low-dose CT screening program.  We will check chest x-ray today.  We did discuss smoking cessation in detail.

## 2021-09-17 NOTE — Assessment & Plan Note (Signed)
Smoking cessation discussed in detail Declines low-dose CT screening program.  Chest x-ray today

## 2021-09-17 NOTE — Progress Notes (Signed)
$'@Patient'P$  ID: John Griffin, male    DOB: [birth year], 71 y.o.   MRN: [id number]  Chief Complaint  Patient presents with   Follow-up    Referring provider: Christain Sacramento, MD  HPI: 71 year old male active smoker followed for COPD and obstructive sleep apnea Medical history significant for diastolic heart failure and diabetes   TEST/EVENTS :  Spirometry 11/2017 shows drop in FEV1 to 50% with ratio 82 PFT (04/2015)  FEV1  2.30  56%.  DLCO 66% HST (06/2015)  AHI 44  09/17/2021 Follow up: COPD and OSA  Patient returns for a follow-up visit.  Last seen November 2022.  Says overall breathing is doing about the same he gets winded with heavy activities.  Is somewhat sedentary.  Says he has balance issues at does not walk long distances.  I previously been on Spiriva in the past but did not have perceived benefit.  Denies any recent flare of cough or wheezing.  No recent steroid use.  Patient has been referred to the lung cancer screening program.  Patient says he is not going to go back because insurance would not cover and he had to pay for it out-of-pocket.  Patient has underlying severe sleep apnea.  Recently got a new CPAP machine.  Patient says he wears his CPAP every single night cannot go without it.  Usually gets in about 8 to 10 hours each day.  Patient says he feels that he benefits from CPAP with decreased daytime sleepiness and improve sleep hygiene. CPAP download has been requested.  We have contacted his DME company and currently the [facility] is down and has been down for the last couple weeks with technical issues.  DME liaison is working on current issue and will fax over download once available.  Patient continues to smoke.  We discussed smoking cessation in detail.    No Known Allergies  Immunization History  Administered Date(s) Administered   Influenza, High Dose Seasonal PF 10/24/2015, 02/23/2017, 12/04/2017, 10/21/2018, 11/21/2020   Influenza, Seasonal,  Injecte, Preservative Fre 02/28/2015   Influenza,inj,Quad PF,6+ Mos 02/28/2015   Influenza-Unspecified 11/21/2015   Janssen (J&J) SARS-COV-2 Vaccination 05/25/2019   Pfizer Covid-19 Vaccine Bivalent Booster 72yr & up 11/21/2020   Pneumococcal Conjugate-13 02/25/2016   Pneumococcal Polysaccharide-23 02/28/2015    Past Medical History:  Diagnosis Date   Arthritis    hands  and shoulders   CAD (coronary artery disease)    a. 01/2016: NSTEMI with cath showing 100% Prox Cx stenosis, 70% Prox LAD, 75% 1st Diag, and 60-70% RCA stenosis with medical therapy pursued as the LCx had collateral flow noted   COPD (chronic obstructive pulmonary disease) (HCC)    Dyspnea    Hypertension    [place] [place]    [place]   Pneumonia    [place]    s/p prostatectomy   Sleep apnea    cpap   Tobacco use    Umbilical hernia    Urinary frequency     Tobacco History: Social History   Tobacco Use  Smoking Status Every Day   Packs/day: 1.00   Years: 47.00   Total pack years: 47.00   Types: Cigarettes  Smokeless Tobacco Never  Tobacco Comments   back to smoking a 1 ppd since 2018   Ready to quit: No Counseling given: Yes Tobacco comments: back to smoking a 1 ppd since 2018   Outpatient Medications Prior to Visit  Medication Sig Dispense Refill   acetaminophen (TYLENOL) 500 MG  tablet Take 1,000 mg by mouth every 6 (six) hours as needed for mild pain. Reported on 05/09/2015     albuterol (VENTOLIN HFA) 108 (90 Base) MCG/ACT inhaler Inhale 2 puffs into the lungs every 4 (four) hours as needed for wheezing or shortness of breath. 18 g 4   aspirin 81 MG chewable tablet Chew 1 tablet (81 mg total) by mouth daily.     atorvastatin (LIPITOR) 80 MG tablet TAKE 1 TABLET BY MOUTH EVERY DAY 90 tablet 3   calcium carbonate (TUMS EX) 750 MG chewable tablet Chew 1 tablet by mouth as needed for heartburn.      cetirizine (ZYRTEC) 10 MG tablet Take 10 mg by mouth daily.       Chlorpheniramine-DM (CORICIDIN HBP COUGH/COLD PO) Take 1 tablet by mouth at bedtime as needed (congestion).     Cholecalciferol 50 MCG (2000 UT) CAPS Take 2,000 Units by mouth daily.      clopidogrel (PLAVIX) 75 MG tablet TAKE 1 TABLET BY MOUTH EVERY DAY 90 tablet 3   fluticasone (FLONASE) 50 MCG/ACT nasal spray Place 2 sprays into both nostrils daily.      furosemide (LASIX) 20 MG tablet TAKE 1 TABLET BY MOUTH EVERY DAY 90 tablet 3   isosorbide mononitrate (IMDUR) 30 MG 24 hr tablet TAKE 1 TABLET BY MOUTH EVERY DAY 90 tablet 3   KLOR-CON M10 10 MEQ tablet TAKE 1 TABLET BY MOUTH EVERY DAY 90 tablet 3   losartan (COZAAR) 25 MG tablet TAKE 1 TABLET (25 MG TOTAL) BY MOUTH DAILY. DUE FOR YEARLY FOLLOW UP WITH CARDIOLOGY 90 tablet 3   metFORMIN (GLUCOPHAGE) 500 MG tablet Take by mouth.     metoprolol tartrate (LOPRESSOR) 25 MG tablet Take 1 tablet (25 mg total) by mouth 2 (two) times daily. 180 tablet 2   nitroGLYCERIN (NITROSTAT) 0.4 MG SL tablet Place 1 tablet (0.4 mg total) under the tongue every 5 (five) minutes x 3 doses as needed for chest pain. 25 tablet 2   No facility-administered medications prior to visit.     Review of Systems:   Constitutional:   No  weight loss, night sweats,  Fevers, chills, + fatigue, or  lassitude.  HEENT:   No headaches,  Difficulty swallowing,  Tooth/dental problems, or  Sore throat,                No sneezing, itching, ear ache, nasal congestion, post nasal drip,   CV:  No chest pain,  Orthopnea, PND, swelling in lower extremities, anasarca, dizziness, palpitations, syncope.   GI  No heartburn, indigestion, abdominal pain, nausea, vomiting, diarrhea, change in bowel habits, loss of appetite, bloody stools.   Resp:   No excess mucus, no productive cough,  No non-productive cough,  No coughing up of blood.  No change in color of mucus.  No wheezing.  No chest wall deformity  Skin: no rash or lesions.  GU: no dysuria, change in color of urine, no urgency  or frequency.  No flank pain, no hematuria   MS: Chronic knee pain,    Physical Exam  BP 138/62 (BP Location: Left Arm, Cuff Size: Normal)   Pulse 88   Temp 98.5 F (36.9 C) (Oral)   Ht '6\' 4"'$  (1.93 m)   Wt (!) 324 lb (147 kg)   SpO2 96%   BMI 39.44 kg/m   GEN: A/Ox3; pleasant , NAD, well nourished    HEENT:  [place]/AT,   NOSE-clear, THROAT-clear, no lesions, no postnasal drip  or exudate noted.   NECK:  Supple w/ fair ROM; no JVD; normal carotid impulses w/o bruits; no thyromegaly or nodules palpated; no lymphadenopathy.    RESP  Clear  P & A; w/o, wheezes/ rales/ or rhonchi. no accessory muscle use, no dullness to percussion  CARD:  RRR, no m/r/g, tr  peripheral edema, pulses intact, no cyanosis or clubbing.  GI:   Soft & nt; nml bowel sounds; no organomegaly or masses detected.   Musco: Warm bil, no deformities or joint swelling noted.   Neuro: alert, no focal deficits noted.  Unsteady gait  Skin: Warm, no lesions or rashes    Lab Results:   BNP   ProBNP No results found for: "PROBNP"  Imaging: No results found.       Latest Ref Rng & Units 05/03/2015   11:44 AM  PFT Results  FVC-Predicted Pre % 54   FVC-Post L 3.21   FVC-Predicted Post % 58   Pre FEV1/FVC % % 77   Post FEV1/FCV % % 73   FEV1-Pre L 2.30   FEV1-Predicted Pre % 56   FEV1-Post L 2.33   DLCO uncorrected ml/min/mmHg 25.84   DLCO UNC% % 66   DLCO corrected ml/min/mmHg 25.99   DLCO COR %Predicted % 66   DLVA Predicted % 98     No results found for: "NITRICOXIDE"      Assessment & Plan:   COPD (chronic obstructive pulmonary disease) (HCC) Moderate to severe COPD currently at baseline.  Patient says overall he is doing well.  Does not want to start on a maintenance regimen.  Patient is advised if he has to use increased albuterol to contact us.  We can look at additional medications as cost of medications is also been a barrier for him before.  He has tried Spiriva, Symbicort and  Dulera in the past patient declines referral again to the low-dose CT screening program.  We will check chest x-ray today.  We did discuss smoking cessation in detail.  OSA (obstructive sleep apnea) Reported excellent control and compliance on CPAP.  Patient has received a new CPAP machine.  Says it is working very well.  He has significant perceived benefit.  CPAP download is been requested.  Unfortunately the [facility] system is having technical issues and unable to do a remote download.  Plan  Patient Instructions  Continue on CPAP At bedtime. Keep up good work.  Chest xray today   Saline nasal rinses Twice daily  As needed   Saline nasal gel At bedtime  .  Work on healthy weight  Do not drive if sleepy.  Albuterol inhaler As needed   Work on not smoking .  Follow up with Dr. Elsworth Soho  In 6 months and As needed.          Tobacco use Smoking cessation discussed in detail Declines low-dose CT screening program.  Chest x-ray today     Rexene Edison, NP 09/17/2021

## 2021-09-17 NOTE — Patient Instructions (Addendum)
Continue on CPAP At bedtime. Keep up good work.  Chest xray today   Saline nasal rinses Twice daily  As needed   Saline nasal gel At bedtime  .  Work on healthy weight  Do not drive if sleepy.  Albuterol inhaler As needed   Work on not smoking .  Follow up with Dr. Elsworth Soho  In 6 months and As needed.

## 2021-09-18 NOTE — Progress Notes (Signed)
Called and spoke with patient, advised of results/recommendations per Rexene Edison NP.  Nothing further needed.

## 2021-09-19 ENCOUNTER — Telehealth: Payer: Self-pay | Admitting: *Deleted

## 2021-09-19 NOTE — Telephone Encounter (Signed)
CORRECTION: PHONE # IS [phone] FAX# [phone]

## 2021-09-19 NOTE — Telephone Encounter (Signed)
   Pre-operative Risk Assessment    Patient Name: John Griffin  DOB: 1950-10-31 MRN: [id number]      Request for Surgical Clearance    Procedure:   COLONOSCOPY  Date of Surgery:  Clearance 10/04/21                                 Surgeon:  DR. HUNG Surgeon's Group or Practice Name:  [facility] Phone number:  [phone] Fax number:  [phone]   Type of Clearance Requested:   - Medical  - Pharmacy:  Hold Aspirin and Clopidogrel (Plavix)     Type of Anesthesia:   PROPOFOL    Additional requests/questions:    [name]   09/19/2021, 3:36 PM

## 2021-09-20 ENCOUNTER — Telehealth: Payer: Self-pay | Admitting: *Deleted

## 2021-09-20 NOTE — Telephone Encounter (Signed)
Left message to call back to set up Tele visit pre op appt

## 2021-09-20 NOTE — Telephone Encounter (Signed)
Please set up virtual telephone visit for cardiac clearance.  Note, based on previous cardiac clearance in August 2018 prior to GI procedure.  Per Dr. Kennon Holter recommendation the time, okay to hold dual antiplatelet therapy as patient did not receive stent.

## 2021-09-20 NOTE — Telephone Encounter (Signed)
Pt called back and is agreeable to plan of care for tele visit pre op appt 09/27/21 @ 2:40. Pt will need to hold Plavix at least x 5 days per preop provider Almyra Deforest, PAC. Pt instructed he can hold his Plavix 09/27/21. Pt thanked me for the help. I will update the requesting office pt has tele visit 09/27/21.   Med rec and consent are done.     Patient Consent for Virtual Visit        Trinten Salemi has provided verbal consent on 09/20/2021 for a virtual visit (video or telephone).   CONSENT FOR VIRTUAL VISIT FOR:  John Griffin  By participating in this virtual visit I agree to the following:  I hereby voluntarily request, consent and authorize [facility] and its employed or contracted physicians, physician assistants, nurse practitioners or other licensed health care professionals (the Practitioner), to provide me with telemedicine health care services (the "Services") as deemed necessary by the treating Practitioner. I acknowledge and consent to receive the Services by the Practitioner via telemedicine. I understand that the telemedicine visit will involve communicating with the Practitioner through live audiovisual communication technology and the disclosure of certain medical information by electronic transmission. I acknowledge that I have been given the opportunity to request an in-person assessment or other available alternative prior to the telemedicine visit and am voluntarily participating in the telemedicine visit.  I understand that I have the right to withhold or withdraw my consent to the use of telemedicine in the course of my care at any time, without affecting my right to future care or treatment, and that the Practitioner or I may terminate the telemedicine visit at any time. I understand that I have the right to inspect all information obtained and/or recorded in the course of the telemedicine visit and may receive copies of available information for a reasonable fee.  I  understand that some of the potential risks of receiving the Services via telemedicine include:  Delay or interruption in medical evaluation due to technological equipment failure or disruption; Information transmitted may not be sufficient (e.g. poor resolution of images) to allow for appropriate medical decision making by the Practitioner; and/or  In rare instances, security protocols could fail, causing a breach of personal health information.  Furthermore, I acknowledge that it is my responsibility to provide information about my medical history, conditions and care that is complete and accurate to the best of my ability. I acknowledge that Practitioner's advice, recommendations, and/or decision may be based on factors not within their control, such as incomplete or inaccurate data provided by me or distortions of diagnostic images or specimens that may result from electronic transmissions. I understand that the practice of medicine is not an exact science and that Practitioner makes no warranties or guarantees regarding treatment outcomes. I acknowledge that a copy of this consent can be made available to me via my patient portal ([facility]), or I can request a printed copy by calling the office of [facility].    I understand that my insurance will be billed for this visit.   I have read or had this consent read to me. I understand the contents of this consent, which adequately explains the benefits and risks of the Services being provided via telemedicine.  I have been provided ample opportunity to ask questions regarding this consent and the Services and have had my questions answered to my satisfaction. I give my informed consent for the services to be  provided through the use of telemedicine in my medical care  .

## 2021-09-20 NOTE — Telephone Encounter (Signed)
Pt called back and is agreeable to plan of care for tele visit pre op appt 09/27/21 @ 2:40. Pt will need to hold Plavix at least x 5 days per preop provider Almyra Deforest, PAC. Pt instructed he can hold his Plavix 09/27/21. Pt thanked me for the help. I will update the requesting office pt has tele visit 09/27/21.    Med rec and consent are done.

## 2021-09-20 NOTE — Telephone Encounter (Signed)
Per Isaac Laud, will need to hold blood thinners at least x 5 days

## 2021-09-26 ENCOUNTER — Telehealth: Payer: Self-pay | Admitting: Acute Care

## 2021-09-26 NOTE — Telephone Encounter (Signed)
noted 

## 2021-09-27 ENCOUNTER — Ambulatory Visit: Payer: Medicare Other | Attending: Cardiovascular Disease | Admitting: Physician Assistant

## 2021-09-27 ENCOUNTER — Encounter (HOSPITAL_COMMUNITY): Payer: Self-pay | Admitting: Gastroenterology

## 2021-09-27 DIAGNOSIS — Z01810 Encounter for preprocedural cardiovascular examination: Secondary | ICD-10-CM | POA: Insufficient documentation

## 2021-09-27 NOTE — Progress Notes (Signed)
Virtual Visit via Telephone Note   Because of John Griffin's co-morbid illnesses, he is at least at moderate risk for complications without adequate follow up.  This format is felt to be most appropriate for this patient at this time.  The patient did not have access to video technology/had technical difficulties with video requiring transitioning to audio format only (telephone).  All issues noted in this document were discussed and addressed.  No physical exam could be performed with this format.  Please refer to the patient's chart for his consent to telehealth for John Griffin.  Evaluation Performed:  Preoperative cardiovascular risk assessment _____________   Date:  09/27/2021   Patient ID:  John Griffin, DOB [birth year], MRN [id number] Patient Location:  Home Provider location:   Office  Primary Care Provider:  Christain Sacramento, John Griffin Primary Cardiologist:  John Burow, John Griffin  Chief Complaint / Patient Profile   71 y.o. y/o male with a h/o ongoing tobacco abuse (1 pack/day), hypertension, OSA, NSTEMI (cardiac catheterization 02/04/2016 revealing 100% nondominant circumflex, 70% proximal LAD and mid dominant artery stenosis), and dyslipidemia who is pending colonoscopy and presents today for telephonic preoperative cardiovascular risk assessment.  Past Medical History    Past Medical History:  Diagnosis Date   Arthritis    hands  and shoulders   CAD (coronary artery disease)    a. 01/2016: NSTEMI with cath showing 100% Prox Cx stenosis, 70% Prox LAD, 75% 1st Diag, and 60-70% RCA stenosis with medical therapy pursued as the LCx had collateral flow noted   COPD (chronic obstructive pulmonary disease) (HCC)    Dyspnea    Hypertension    Myocardial infarction ([place])    01/2016   Pneumonia    Prostate cancer [facility])    s/p prostatectomy   Sleep apnea    cpap   Tobacco use    Umbilical hernia    Urinary frequency    Past Surgical History:  Procedure Laterality Date    bone graft right ankle   1972   CARDIAC CATHETERIZATION N/A 02/04/2016   Procedure: Left Heart Cath and Coronary Angiography;  Surgeon: John Harp, John Griffin;  Location: [facility];  Service: Cardiovascular;  Laterality: N/A;   COLONOSCOPY WITH PROPOFOL N/A 09/19/2016   Procedure: COLONOSCOPY WITH PROPOFOL;  Surgeon: John Ada, John Griffin;  Location: [facility];  Service: Endoscopy;  Laterality: N/A;   HERNIA REPAIR     LYMPHADENECTOMY Bilateral 08/17/2014   Procedure: LYMPHADENECTOMY;  Surgeon: John Bring, John Griffin;  Location: [facility];  Service: Urology;  Laterality: Bilateral;   right shoulder surgery   [place] N/A 08/17/2014   Procedure: ROBOTIC ASSISTED LAPAROSCOPIC RADICAL PROSTATECTOMY LEVEL 3  AND UMBILICAL HERNIA REPAIR;  Surgeon: John Bring, John Griffin;  Location: [facility];  Service: Urology;  Laterality: N/A;    Allergies  No Known Allergies  History of Present Illness    John Griffin is a 71 y.o. male who presents via audio/video conferencing for a telehealth visit today.  Pt was last seen in cardiology clinic on 06/04/2021 by John Griffin.  At that time John Griffin was doing well .  The patient is now pending procedure as outlined above. Since his last visit, he has been doing pretty good from a cardiac standpoint.  He tells me that everything is going good.  He went to his breathing doctor last week and his blood pressure was well controlled.  He does not have any new shortness of  breath or chest pain.  He tells me that he can walk but not a significant distance.  He is able to do light and moderate housework and go up and down stairs.  For this reason he is scored a 5.07 METS on the DASI.  This exceeds the 4 METS minimum requirement.  Per office protocol and John Griffin, okay to hold aspirin x7 days and Plavix x5 days prior to the procedure.  Please resume medically safe to do so.  Home Medications    Prior to Admission medications    Medication Sig Start Date End Date Taking? Authorizing Provider  acetaminophen (TYLENOL) 500 MG tablet Take 1,000 mg by mouth every 6 (six) hours as needed for mild pain. Reported on 05/09/2015    Provider, Historical, John Griffin  albuterol (VENTOLIN HFA) 108 (90 Base) MCG/ACT inhaler Inhale 2 puffs into the lungs every 4 (four) hours as needed for wheezing or shortness of breath. 11/27/20   Parrett, John Mu, John Griffin  aspirin 81 MG chewable tablet Chew 1 tablet (81 mg total) by mouth daily. 02/07/16   Strader, Fransisco Hertz, PA-C  atorvastatin (LIPITOR) 80 MG tablet TAKE 1 TABLET BY MOUTH EVERY DAY 03/07/21   John Harp, John Griffin  calcium carbonate (TUMS EX) 750 MG chewable tablet Chew 1 tablet by mouth as needed for heartburn.     Provider, Historical, John Griffin  cetirizine (ZYRTEC) 10 MG tablet Take 10 mg by mouth daily.     Provider, Historical, John Griffin  Chlorpheniramine-DM (CORICIDIN HBP COUGH/COLD PO) Take 1 tablet by mouth at bedtime as needed (congestion).    Provider, Historical, John Griffin  Cholecalciferol 50 MCG (2000 UT) CAPS Take 2,000 Units by mouth daily.     Provider, Historical, John Griffin  clopidogrel (PLAVIX) 75 MG tablet TAKE 1 TABLET BY MOUTH EVERY DAY 12/10/20   John Harp, John Griffin  fluticasone [facility]) 50 MCG/ACT nasal spray Place 2 sprays into both nostrils daily.     Provider, Historical, John Griffin  furosemide (LASIX) 20 MG tablet TAKE 1 TABLET BY MOUTH EVERY DAY 06/11/21   John Harp, John Griffin  isosorbide mononitrate (IMDUR) 30 MG 24 hr tablet TAKE 1 TABLET BY MOUTH EVERY DAY 09/16/21   John Harp, John Griffin  KLOR-CON M10 10 MEQ tablet TAKE 1 TABLET BY MOUTH EVERY DAY 06/11/21   John Harp, John Griffin  losartan (COZAAR) 25 MG tablet TAKE 1 TABLET (25 MG TOTAL) BY MOUTH DAILY. DUE FOR YEARLY FOLLOW UP WITH CARDIOLOGY 03/07/21   John Harp, John Griffin  metFORMIN (GLUCOPHAGE) 500 MG tablet Take by mouth. 01/10/19   Provider, Historical, John Griffin  metoprolol tartrate (LOPRESSOR) 25 MG tablet Take 1 tablet (25 mg total) by mouth 2 (two)  times daily. 09/10/21   John Harp, John Griffin  nitroGLYCERIN (NITROSTAT) 0.4 MG SL tablet Place 1 tablet (0.4 mg total) under the tongue every 5 (five) minutes x 3 doses as needed for chest pain. 05/08/17   Erlene Quan, PA-C    Physical Exam    Vital Signs:  Bueford Arp does not have vital signs available for review today.  Given telephonic nature of communication, physical exam is limited. AAOx3. NAD. Normal affect.  Speech and respirations are unlabored.  Accessory Clinical Findings    None  Assessment & Plan    1.  Preoperative Cardiovascular Risk Assessment:  Mr. Bernardini's perioperative risk of a major cardiac event is 0.9% according to the Revised Cardiac Risk Index (RCRI).  Therefore, he is at low risk for perioperative complications.  His functional capacity is good at 5.07 METs according to the [facility] Activity Status Index (DASI). Recommendations: According to ACC/AHA guidelines, no further cardiovascular testing needed.  The patient may proceed to surgery at acceptable risk.   Antiplatelet and/or Anticoagulation Recommendations: Aspirin can be held for 7 days prior to his surgery.  Please resume Aspirin post operatively when it is felt to be safe from a bleeding standpoint.  Clopidogrel (Plavix) can be held for 5 days prior to his surgery and resumed as soon as possible post op.  A copy of this note will be routed to requesting surgeon.  Time:   Today, I have spent 10 minutes with the patient with telehealth technology discussing medical history, symptoms, and management plan.     Elgie Collard, PA-C  09/27/2021, 9:58 AM

## 2021-10-04 ENCOUNTER — Ambulatory Visit (HOSPITAL_BASED_OUTPATIENT_CLINIC_OR_DEPARTMENT_OTHER): Payer: Medicare Other | Admitting: Physician Assistant

## 2021-10-04 ENCOUNTER — Ambulatory Visit (HOSPITAL_COMMUNITY)
Admission: RE | Admit: 2021-10-04 | Discharge: 2021-10-04 | Disposition: A | Discharge status: Home / Self Care | Payer: Medicare Other | Attending: Gastroenterology | Admitting: Gastroenterology

## 2021-10-04 ENCOUNTER — Ambulatory Visit (HOSPITAL_COMMUNITY): Payer: Medicare Other | Admitting: Physician Assistant

## 2021-10-04 ENCOUNTER — Other Ambulatory Visit: Payer: Self-pay

## 2021-10-04 ENCOUNTER — Encounter (HOSPITAL_COMMUNITY)
Admission: RE | Disposition: A | Discharge status: Home / Self Care | Payer: Self-pay | Source: Home / Self Care | Attending: Gastroenterology

## 2021-10-04 ENCOUNTER — Encounter (HOSPITAL_COMMUNITY): Payer: Self-pay | Admitting: Gastroenterology

## 2021-10-04 DIAGNOSIS — I509 Heart failure, unspecified: Secondary | ICD-10-CM | POA: Diagnosis not present

## 2021-10-04 DIAGNOSIS — F17210 Nicotine dependence, cigarettes, uncomplicated: Secondary | ICD-10-CM

## 2021-10-04 DIAGNOSIS — I2510 Atherosclerotic heart disease of native coronary artery without angina pectoris: Secondary | ICD-10-CM | POA: Diagnosis not present

## 2021-10-04 DIAGNOSIS — I110 Hypertensive heart disease with heart failure: Secondary | ICD-10-CM

## 2021-10-04 DIAGNOSIS — Z6838 Body mass index (BMI) 38.0-38.9, adult: Secondary | ICD-10-CM | POA: Insufficient documentation

## 2021-10-04 DIAGNOSIS — K635 Polyp of colon: Secondary | ICD-10-CM

## 2021-10-04 DIAGNOSIS — M1990 Unspecified osteoarthritis, unspecified site: Secondary | ICD-10-CM | POA: Diagnosis not present

## 2021-10-04 DIAGNOSIS — G4730 Sleep apnea, unspecified: Secondary | ICD-10-CM | POA: Insufficient documentation

## 2021-10-04 DIAGNOSIS — I252 Old myocardial infarction: Secondary | ICD-10-CM | POA: Diagnosis not present

## 2021-10-04 DIAGNOSIS — Z1211 Encounter for screening for malignant neoplasm of colon: Secondary | ICD-10-CM | POA: Diagnosis present

## 2021-10-04 DIAGNOSIS — Z79899 Other long term (current) drug therapy: Secondary | ICD-10-CM | POA: Insufficient documentation

## 2021-10-04 DIAGNOSIS — Z86010 Personal history of colonic polyps: Secondary | ICD-10-CM | POA: Insufficient documentation

## 2021-10-04 DIAGNOSIS — J449 Chronic obstructive pulmonary disease, unspecified: Secondary | ICD-10-CM | POA: Diagnosis not present

## 2021-10-04 DIAGNOSIS — Z7902 Long term (current) use of antithrombotics/antiplatelets: Secondary | ICD-10-CM | POA: Insufficient documentation

## 2021-10-04 HISTORY — PX: POLYPECTOMY: SHX5525

## 2021-10-04 HISTORY — PX: COLONOSCOPY WITH PROPOFOL: SHX5780

## 2021-10-04 LAB — GLUCOSE, CAPILLARY: Glucose-Capillary: 128 mg/dL — ABNORMAL HIGH (ref 70–99)

## 2021-10-04 SURGERY — COLONOSCOPY WITH PROPOFOL
Anesthesia: Monitor Anesthesia Care

## 2021-10-04 MED ORDER — SODIUM CHLORIDE 0.9 % IV SOLN: INTRAVENOUS | Status: DC

## 2021-10-04 MED ORDER — PROPOFOL 500 MG/50ML IV EMUL
INTRAVENOUS | Status: DC | PRN
  Administered 2021-10-04: 70 mg via INTRAVENOUS
  Administered 2021-10-04: 100 ug/kg/min via INTRAVENOUS
  Administered 2021-10-04: 100 mg via INTRAVENOUS

## 2021-10-04 MED ORDER — LACTATED RINGERS IV SOLN
INTRAVENOUS | Status: AC | PRN
  Administered 2021-10-04: 1000 mL via INTRAVENOUS

## 2021-10-04 MED ORDER — LABETALOL HCL 5 MG/ML IV SOLN
INTRAVENOUS | Status: DC | PRN
  Administered 2021-10-04 (×3): 5 mg via INTRAVENOUS

## 2021-10-04 MED ORDER — PROPOFOL 1000 MG/100ML IV EMUL
INTRAVENOUS | Status: AC
  Filled 2021-10-04: qty 100

## 2021-10-04 SURGICAL SUPPLY — 22 items

## 2021-10-04 NOTE — Anesthesia Preprocedure Evaluation (Signed)
Anesthesia Evaluation  Patient identified by MRN, date of birth, ID band Patient awake    Reviewed: Allergy & Precautions, NPO status , Patient's Chart, lab work & pertinent test results, reviewed documented beta blocker date and time   History of Anesthesia Complications Negative for: history of anesthetic complications  Airway Mallampati: II  TM Distance: >3 FB Neck ROM: Full    Dental  (+) Edentulous Upper, Edentulous Lower   Pulmonary shortness of breath, sleep apnea and Continuous Positive Airway Pressure Ventilation , COPD,  COPD inhaler, Current Smoker, former smoker,    breath sounds clear to auscultation       Cardiovascular hypertension, Pt. on medications and Pt. on home beta blockers (-) angina+ CAD, + Past MI and +CHF   Rhythm:Regular  ECHO 18 Left ventricle: The endocardium is not well seen. the inferior  wall appears to be hypokinetic. The cavity size was normal. Wall  thickness was normal. Systolic function was mildly reduced. The  estimated ejection fraction was in the range of 45% to 50%.  Features are consistent with a pseudonormal left ventricular  filling pattern, with concomitant abnormal relaxation and increased  filling pressure (grade 2 diastolic dysfunction).   Cath 18 There is mild left ventricular systolic dysfunction. LV end diastolic pressure is moderately elevated. The left ventricular ejection fraction is 50-55% by visual estimate. Mid RCA lesion, 70 %stenosed. Dist RCA lesion, 60 %stenosed. Prox Cx lesion, 100 %stenosed. Ost LAD to Prox LAD lesion, 70 %stenosed. 1st Diag lesion, 75 %stenosed.    Neuro/Psych negative neurological ROS  negative psych ROS   GI/Hepatic negative GI ROS, Neg liver ROS,   Endo/Other  Morbid obesity  Renal/GU negative Renal ROS     Musculoskeletal  (+) Arthritis ,   Abdominal   Peds  Hematology negative hematology ROS (+)   Anesthesia Other  Findings   Reproductive/Obstetrics                             Anesthesia Physical  Anesthesia Plan  ASA: 3  Anesthesia Plan: MAC   Post-op Pain Management: Minimal or no pain anticipated   Induction: Intravenous  PONV Risk Score and Plan: 1 and Treatment may vary due to age or medical condition and Propofol infusion  Airway Management Planned: Natural Airway and Simple Face Mask  Additional Equipment: None  Intra-op Plan:   Post-operative Plan:   Informed Consent: I have reviewed the patients History and Physical, chart, labs and discussed the procedure including the risks, benefits and alternatives for the proposed anesthesia with the patient or authorized representative who has indicated his/her understanding and acceptance.     Dental advisory given  Plan Discussed with: CRNA, Surgeon and Anesthesiologist  Anesthesia Plan Comments:         Anesthesia Quick Evaluation

## 2021-10-04 NOTE — Discharge Instructions (Signed)

## 2021-10-04 NOTE — H&P (Signed)
[name] HPI: Recently he noticed some mild hematochezia.  There was some blood tinged water.  He denies any problems with chest pain, SOB, or MI.  His last colonoscopy in 2018 showed that he had 4-5 adenomas that were small.  The patient does not know his A1C, but he states that he is still "prediabetes".  He continues with Plavix for his CAD.  Dr. [name] is his cardiologist.  Past Medical History:  Diagnosis Date   Arthritis    hands  and shoulders   CAD (coronary artery disease)    a. 01/2016: NSTEMI with cath showing 100% Prox Cx stenosis, 70% Prox LAD, 75% 1st Diag, and 60-70% RCA stenosis with medical therapy pursued as the LCx had collateral flow noted   COPD (chronic obstructive pulmonary disease) ([place])    Dyspnea    Hypertension    Myocardial infarction ([place])    01/2016   Pneumonia    Prostate cancer [facility])    s/p prostatectomy   Sleep apnea    cpap   Tobacco use    Umbilical hernia    Urinary frequency     Past Surgical History:  Procedure Laterality Date   bone graft right ankle   1972   CARDIAC CATHETERIZATION N/A 02/04/2016   Procedure: Left Heart Cath and Coronary Angiography;  Surgeon: Lorretta Harp, MD;  Location: [place] [facility];  Service: Cardiovascular;  Laterality: N/A;   COLONOSCOPY WITH PROPOFOL N/A 09/19/2016   Procedure: COLONOSCOPY WITH PROPOFOL;  Surgeon: Carol Ada, MD;  [facility];  Service: Endoscopy;  Laterality: N/A;   HERNIA REPAIR     LYMPHADENECTOMY Bilateral 08/17/2014   Procedure: LYMPHADENECTOMY;  Surgeon: Raynelle Bring, MD;  Location: [facility];  Service: Urology;  Laterality: Bilateral;   right shoulder surgery   [place] N/A 08/17/2014   Procedure: ROBOTIC ASSISTED LAPAROSCOPIC RADICAL PROSTATECTOMY LEVEL 3  AND UMBILICAL HERNIA REPAIR;  Surgeon: Raynelle Bring, MD;  Location: [facility];  Service: Urology;  Laterality: N/A;    Family History  Problem Relation Age of Onset    Dementia Mother    Heart attack Father 30   CAD Father        s/p cabg    Social History:  reports that he has been smoking cigarettes. He has a 47.00 pack-year smoking history. He has never used smokeless tobacco. He reports current alcohol use. He reports that he does not use drugs.  Allergies:  Allergies  Allergen Reactions   Lisinopril Cough    Medications: Scheduled: Continuous:  sodium chloride     lactated ringers 1,000 mL (10/04/21 0943)    Results for orders placed or performed during the hospital encounter of 10/04/21 (from the past 24 hour(s))  Glucose, capillary     Status: Abnormal   Collection Time: 10/04/21  9:40 AM  Result Value Ref Range   Glucose-Capillary 128 (H) 70 - 99 mg/dL     No results found.  ROS:  As stated above in the HPI otherwise negative.  Blood pressure 119/68, pulse 88, temperature 97.7 F (36.5 C), temperature source Tympanic, resp. rate 10, height '6\' 4"'$  (1.93 m), weight (!) 142.9 kg, SpO2 93 %.    PE: Gen: NAD, Alert and Oriented HEENT:  [place]/AT, EOMI Neck: Supple, no LAD Lungs: CTA Bilaterally CV: RRR without M/G/R ABD: Soft, NTND, +BS Ext: No C/C/E  Assessment/Plan: 1) Personal history of polyps - colonoscopy.  Mea Ozga D 10/04/2021, 10:10 AM

## 2021-10-04 NOTE — Op Note (Signed)
[facility] Patient Name: John Griffin Procedure Date: 10/04/2021 MRN: [id number] Attending MD: Carol Ada , MD Date of Birth: Jun 13, 1950 CSN: [id number] Age: 71 Admit Type: Inpatient Procedure:                Colonoscopy Indications:              High risk colon cancer surveillance: Personal                            history of colonic polyps Providers:                Carol Ada, MD, Dulcy Fanny, [facility]                            Technician, Technician, Brien Mates, RNFA Referring MD:              Medicines:                 Complications:            No immediate complications. Estimated Blood Loss:     Estimated blood loss: none. Procedure:                Pre-Anesthesia Assessment:                           - Prior to the procedure, a History and Physical                            was performed, and patient medications and                            allergies were reviewed. The patient's tolerance of                            previous anesthesia was also reviewed. The risks                            and benefits of the procedure and the sedation                            options and risks were discussed with the patient.                            All questions were answered, and informed consent                            was obtained. Prior Anticoagulants: The patient has                            taken Plavix (clopidogrel), last dose was 8 days                            prior to procedure. ASA Grade Assessment: III - A  patient with severe systemic disease. After                            reviewing the risks and benefits, the patient was                            deemed in satisfactory condition to undergo the                            procedure.                           - Sedation was administered by an anesthesia                            professional. Deep sedation was attained.                            After obtaining informed consent, the colonoscope                            was passed under direct vision. Throughout the                            procedure, the patient's blood pressure, pulse, and                            oxygen saturations were monitored continuously. The                            CF-HQ190L ([id number]) Olympus colonoscope was                            introduced through the anus and advanced to the the                            cecum, identified by appendiceal orifice and                            ileocecal valve. The colonoscopy was performed                            without difficulty. The patient tolerated the                            procedure well. The quality of the bowel                            preparation was evaluated using the BBPS [facility]                            Bowel Preparation Scale) with scores of: Right                            Colon =  3 (entire mucosa seen well with no residual                            staining, small fragments of stool or opaque                            liquid), Transverse Colon = 2 (minor amount of                            residual staining, small fragments of stool and/or                            opaque liquid, but mucosa seen well) and Left Colon                            = 3 (entire mucosa seen well with no residual                            staining, small fragments of stool or opaque                            liquid). The total BBPS score equals 8. The quality                            of the bowel preparation was good. The ileocecal                            valve, appendiceal orifice, and rectum were                            photographed. Scope In: 10:14:37 AM Scope Out: 10:25:04 AM Scope Withdrawal Time: 0 hours 9 minutes 12 seconds  Total Procedure Duration: 0 hours 10 minutes 27 seconds  Findings:      Two sessile polyps were found in the ascending colon and cecum. The       polyps were  3 to 4 mm in size. These polyps were removed with a cold       snare. Resection and retrieval were complete. Impression:               - Two 3 to 4 mm polyps in the ascending colon and                            in the cecum, removed with a cold snare. Resected                            and retrieved. Moderate Sedation:      Not Applicable - Patient had care per Anesthesia. Recommendation:           - Patient has a contact number available for                            emergencies. The signs and symptoms of potential  delayed complications were discussed with the                            patient. Return to normal activities tomorrow.                            Written discharge instructions were provided to the                            patient.                           - Resume previous diet.                           - Continue present medications.                           - Await pathology results.                           - Repeat colonoscopy is not recommended for                            surveillance.                           - Resume Plavix. Procedure Code(s):        --- Professional ---                           [phone], Colonoscopy, flexible; with removal of                            tumor(s), polyp(s), or other lesion(s) by snare                            technique Diagnosis Code(s):        --- Professional ---                           K63.5, Polyp of colon                           Z86.010, Personal history of colonic polyps CPT copyright 2019 American Medical Association. All rights reserved. The codes documented in this report are preliminary and upon coder review may  be revised to meet current compliance requirements. Carol Ada, MD Carol Ada, MD 10/04/2021 10:30:10 AM This report has been signed electronically. Number of Addenda: 0

## 2021-10-04 NOTE — Transfer of Care (Signed)
Immediate Anesthesia Transfer of Care Note  Patient: John Griffin  Procedure(s) Performed: COLONOSCOPY WITH PROPOFOL POLYPECTOMY  Patient Location: PACU  Anesthesia Type:MAC  Level of Consciousness: awake  Airway & Oxygen Therapy: Patient Spontanous Breathing  Post-op Assessment: Report given to RN  Post vital signs: stable  Last Vitals:  Vitals Value Taken Time  BP 130/86 10/04/21 1034  Temp 36.4 C 10/04/21 1033  Pulse 94 10/04/21 1039  Resp 19 10/04/21 1039  SpO2 95 % 10/04/21 1039  Vitals shown include unvalidated device data.  Last Pain:  Vitals:   10/04/21 1033  TempSrc: Tympanic  PainSc:          Complications: No notable events documented.

## 2021-10-06 ENCOUNTER — Encounter (HOSPITAL_COMMUNITY): Payer: Self-pay | Admitting: Gastroenterology

## 2021-10-07 LAB — SURGICAL PATHOLOGY

## 2021-10-15 ENCOUNTER — Other Ambulatory Visit: Payer: Medicare Other

## 2021-10-18 NOTE — Anesthesia Postprocedure Evaluation (Signed)
Anesthesia Post Note  Patient: Kj Imbert  Procedure(s) Performed: COLONOSCOPY WITH PROPOFOL POLYPECTOMY     Patient location during evaluation: PACU Anesthesia Type: MAC Level of consciousness: awake and alert Pain management: pain level controlled Vital Signs Assessment: post-procedure vital signs reviewed and stable Respiratory status: spontaneous breathing, nonlabored ventilation, respiratory function stable and patient connected to nasal cannula oxygen Cardiovascular status: stable and blood pressure returned to baseline Postop Assessment: no apparent nausea or vomiting Anesthetic complications: no   No notable events documented.  Last Vitals:  Vitals:   10/04/21 1040 10/04/21 1050  BP:  109/61  Pulse: 92 93  Resp: (!) 21 (!) 29  Temp:    SpO2: 93% 96%    Last Pain:  Vitals:   10/04/21 1050  TempSrc:   PainSc: 0-No pain                 Nieshia Larmon

## 2021-11-26 ENCOUNTER — Other Ambulatory Visit: Payer: Self-pay | Admitting: Cardiovascular Disease

## 2021-11-27 DIAGNOSIS — I700 Atherosclerosis of aorta: Secondary | ICD-10-CM | POA: Insufficient documentation

## 2022-03-10 ENCOUNTER — Other Ambulatory Visit: Payer: Self-pay | Admitting: Cardiovascular Disease

## 2022-04-01 ENCOUNTER — Telehealth: Payer: Self-pay | Admitting: Cardiovascular Disease

## 2022-04-01 NOTE — Telephone Encounter (Signed)
*  STAT* If patient is at the pharmacy, call can be transferred to refill team.   1. Which medications need to be refilled? (please list name of each medication and dose if known)   losartan (COZAAR) 25 MG tablet    2. Which pharmacy/location (including street and city if local pharmacy) is medication to be sent to?  [id number] - [place], [place] - [address]    3. Do they need a 30 day or 90 day supply? 90 day   Pt is currently out of medication

## 2022-04-04 MED ORDER — LOSARTAN POTASSIUM 25 MG PO TABS: 25.0000 mg | ORAL_TABLET | Freq: Every day | ORAL | 1 refills | Status: DC

## 2022-04-04 NOTE — Telephone Encounter (Signed)
Called and left a voice message stating that his refill for Losartan 25 mg daily has been sent to his requested pharmacy and to contact our office if pharmacy has not received Rx from our office.

## 2022-06-04 ENCOUNTER — Other Ambulatory Visit: Payer: Self-pay | Admitting: Cardiovascular Disease

## 2022-06-24 ENCOUNTER — Other Ambulatory Visit: Payer: Self-pay | Admitting: Cardiovascular Disease

## 2022-07-02 ENCOUNTER — Inpatient Hospital Stay (HOSPITAL_COMMUNITY)
Admission: EM | Admit: 2022-07-02 | Discharge: 2022-07-05 | DRG: 240 | Disposition: A | Discharge status: Home / Self Care | Payer: Medicare Other | Attending: Internal Medicine | Admitting: Internal Medicine

## 2022-07-02 ENCOUNTER — Emergency Department (HOSPITAL_COMMUNITY): Payer: Medicare Other

## 2022-07-02 ENCOUNTER — Other Ambulatory Visit: Payer: Self-pay

## 2022-07-02 DIAGNOSIS — Z79899 Other long term (current) drug therapy: Secondary | ICD-10-CM

## 2022-07-02 DIAGNOSIS — Z8546 Personal history of malignant neoplasm of prostate: Secondary | ICD-10-CM

## 2022-07-02 DIAGNOSIS — I83019 Varicose veins of right lower extremity with ulcer of unspecified site: Secondary | ICD-10-CM

## 2022-07-02 DIAGNOSIS — E1152 Type 2 diabetes mellitus with diabetic peripheral angiopathy with gangrene: Secondary | ICD-10-CM | POA: Diagnosis not present

## 2022-07-02 DIAGNOSIS — I96 Gangrene, not elsewhere classified: Secondary | ICD-10-CM

## 2022-07-02 DIAGNOSIS — J449 Chronic obstructive pulmonary disease, unspecified: Secondary | ICD-10-CM | POA: Diagnosis present

## 2022-07-02 DIAGNOSIS — Z888 Allergy status to other drugs, medicaments and biological substances status: Secondary | ICD-10-CM

## 2022-07-02 DIAGNOSIS — I5022 Chronic systolic (congestive) heart failure: Secondary | ICD-10-CM | POA: Diagnosis present

## 2022-07-02 DIAGNOSIS — Z7984 Long term (current) use of oral hypoglycemic drugs: Secondary | ICD-10-CM

## 2022-07-02 DIAGNOSIS — I252 Old myocardial infarction: Secondary | ICD-10-CM

## 2022-07-02 DIAGNOSIS — E785 Hyperlipidemia, unspecified: Secondary | ICD-10-CM | POA: Diagnosis present

## 2022-07-02 DIAGNOSIS — Z7902 Long term (current) use of antithrombotics/antiplatelets: Secondary | ICD-10-CM

## 2022-07-02 DIAGNOSIS — Z7982 Long term (current) use of aspirin: Secondary | ICD-10-CM

## 2022-07-02 DIAGNOSIS — Z6838 Body mass index (BMI) 38.0-38.9, adult: Secondary | ICD-10-CM

## 2022-07-02 DIAGNOSIS — Z8249 Family history of ischemic heart disease and other diseases of the circulatory system: Secondary | ICD-10-CM

## 2022-07-02 DIAGNOSIS — L97919 Non-pressure chronic ulcer of unspecified part of right lower leg with unspecified severity: Secondary | ICD-10-CM | POA: Diagnosis not present

## 2022-07-02 DIAGNOSIS — I2510 Atherosclerotic heart disease of native coronary artery without angina pectoris: Secondary | ICD-10-CM | POA: Diagnosis present

## 2022-07-02 DIAGNOSIS — I8390 Asymptomatic varicose veins of unspecified lower extremity: Secondary | ICD-10-CM | POA: Diagnosis present

## 2022-07-02 DIAGNOSIS — I110 Hypertensive heart disease with heart failure: Secondary | ICD-10-CM | POA: Diagnosis present

## 2022-07-02 DIAGNOSIS — I998 Other disorder of circulatory system: Principal | ICD-10-CM

## 2022-07-02 DIAGNOSIS — E669 Obesity, unspecified: Secondary | ICD-10-CM | POA: Diagnosis present

## 2022-07-02 DIAGNOSIS — F17210 Nicotine dependence, cigarettes, uncomplicated: Secondary | ICD-10-CM | POA: Diagnosis present

## 2022-07-02 DIAGNOSIS — H9190 Unspecified hearing loss, unspecified ear: Secondary | ICD-10-CM | POA: Diagnosis present

## 2022-07-02 DIAGNOSIS — G4733 Obstructive sleep apnea (adult) (pediatric): Secondary | ICD-10-CM | POA: Diagnosis present

## 2022-07-02 LAB — CBC WITH DIFFERENTIAL/PLATELET
Abs Immature Granulocytes: 0.03 10*3/uL (ref 0.00–0.07)
Basophils Absolute: 0.1 10*3/uL (ref 0.0–0.1)
Basophils Relative: 1 %
Eosinophils Absolute: 0.2 10*3/uL (ref 0.0–0.5)
Eosinophils Relative: 2 %
HCT: 48.2 % (ref 39.0–52.0)
Hemoglobin: 16 g/dL (ref 13.0–17.0)
Immature Granulocytes: 0 %
Lymphocytes Relative: 21 %
Lymphs Abs: 1.9 10*3/uL (ref 0.7–4.0)
MCH: 32.1 pg (ref 26.0–34.0)
MCHC: 33.2 g/dL (ref 30.0–36.0)
MCV: 96.8 fL (ref 80.0–100.0)
Monocytes Absolute: 0.8 10*3/uL (ref 0.1–1.0)
Monocytes Relative: 9 %
Neutro Abs: 6 10*3/uL (ref 1.7–7.7)
Neutrophils Relative %: 67 %
Platelets: 200 10*3/uL (ref 150–400)
RBC: 4.98 MIL/uL (ref 4.22–5.81)
RDW: 13.1 % (ref 11.5–15.5)
WBC: 9.1 10*3/uL (ref 4.0–10.5)
nRBC: 0 % (ref 0.0–0.2)

## 2022-07-02 LAB — COMPREHENSIVE METABOLIC PANEL
ALT: 54 U/L — ABNORMAL HIGH (ref 0–44)
AST: 47 U/L — ABNORMAL HIGH (ref 15–41)
Albumin: 3.4 g/dL — ABNORMAL LOW (ref 3.5–5.0)
Alkaline Phosphatase: 139 U/L — ABNORMAL HIGH (ref 38–126)
Anion gap: 9 (ref 5–15)
BUN: 13 mg/dL (ref 8–23)
CO2: 26 mmol/L (ref 22–32)
Calcium: 9 mg/dL (ref 8.9–10.3)
Chloride: 102 mmol/L (ref 98–111)
Creatinine, Ser: 0.64 mg/dL (ref 0.61–1.24)
GFR, Estimated: >60 mL/min (ref >60)
Glucose, Bld: 121 mg/dL — ABNORMAL HIGH (ref 70–99)
Potassium: 3.9 mmol/L (ref 3.5–5.1)
Sodium: 137 mmol/L (ref 135–145)
Total Bilirubin: 1 mg/dL (ref 0.3–1.2)
Total Protein: 6.6 g/dL (ref 6.5–8.1)

## 2022-07-02 LAB — SURGICAL PCR SCREEN
MRSA, PCR: NEGATIVE
Staphylococcus aureus: NEGATIVE

## 2022-07-02 LAB — TYPE AND SCREEN
ABO/RH(D): A NEG
Antibody Screen: NEGATIVE

## 2022-07-02 LAB — PROTIME-INR
INR: 1 (ref 0.8–1.2)
Prothrombin Time: 13.4 seconds (ref 11.4–15.2)

## 2022-07-02 LAB — GLUCOSE, CAPILLARY: Glucose-Capillary: 111 mg/dL — ABNORMAL HIGH (ref 70–99)

## 2022-07-02 LAB — APTT: aPTT: 31 seconds (ref 24–36)

## 2022-07-02 MED ORDER — INSULIN ASPART 100 UNIT/ML IJ SOLN
0.0000 [IU] | Freq: Three times a day (TID) | INTRAMUSCULAR | Status: DC
  Administered 2022-07-03 – 2022-07-05 (×5): 2 [IU] via SUBCUTANEOUS

## 2022-07-02 MED ORDER — ALBUTEROL SULFATE (2.5 MG/3ML) 0.083% IN NEBU: 3.0000 mL | INHALATION_SOLUTION | RESPIRATORY_TRACT | Status: DC | PRN

## 2022-07-02 MED ORDER — NICOTINE 21 MG/24HR TD PT24
21.0000 mg | MEDICATED_PATCH | Freq: Every day | TRANSDERMAL | Status: DC
  Administered 2022-07-02: 21 mg via TRANSDERMAL
  Filled 2022-07-02 (×2): qty 1

## 2022-07-02 MED ORDER — ENOXAPARIN SODIUM 40 MG/0.4ML IJ SOSY
40.0000 mg | PREFILLED_SYRINGE | INTRAMUSCULAR | Status: DC
  Administered 2022-07-03 – 2022-07-04 (×2): 40 mg via SUBCUTANEOUS
  Filled 2022-07-02 (×2): qty 0.4

## 2022-07-02 MED ORDER — METOPROLOL TARTRATE 25 MG PO TABS
25.0000 mg | ORAL_TABLET | Freq: Two times a day (BID) | ORAL | Status: DC
  Administered 2022-07-02 – 2022-07-05 (×6): 25 mg via ORAL
  Filled 2022-07-02 (×6): qty 1

## 2022-07-02 MED ORDER — ATORVASTATIN CALCIUM 80 MG PO TABS
80.0000 mg | ORAL_TABLET | Freq: Every day | ORAL | Status: DC
  Administered 2022-07-02 – 2022-07-05 (×4): 80 mg via ORAL
  Filled 2022-07-02 (×4): qty 1

## 2022-07-02 NOTE — Progress Notes (Signed)
New Admission Note:   Arrival Method: Stretcher  Mental Orientation: alert and oriented x 4  Telemetry:none  Assessment: Completed Skin:wound on dorsal area of right foot and black 5th toe  IV:20 G R AC  Pain: 0/10  Tubes: none Safety Measures: Safety Fall Prevention Plan has been given, discussed and signed Admission: Completed [facility] Orientation: Patient has been orientated to the room, unit and staff.  Family: wife and son   Orders have been reviewed and implemented. Will continue to monitor the patient. Call light has been placed within reach and bed alarm has been activated.   Stacie Glaze LPN [facility] Renal Phone: [phone]

## 2022-07-02 NOTE — Consult Note (Signed)
Reason for Consult:Left toe gangrene Referring Physician: Daine Gravel Time called: 1015 Time at bedside: 1021   John Griffin is an 72 y.o. male.  HPI: John Griffin stubbed his left foot on a Lego about a week ago. He was seen at [facility] and dx with fxs of the 4th and 5th toes. A few days ago the toe swelled and the skin sloughed and began to turn dark. He was seen at his PCP's office and was awaiting a referral to orthopedics but decided to seek evaluation here in the ED and orthopedic surgery was consulted. He denies any other symptoms and his neuropathy is complete.  Past Medical History:  Diagnosis Date   Arthritis    hands  and shoulders   CAD (coronary artery disease)    a. 01/2016: NSTEMI with cath showing 100% Prox Cx stenosis, 70% Prox LAD, 75% 1st Diag, and 60-70% RCA stenosis with medical therapy pursued as the LCx had collateral flow noted   COPD (chronic obstructive pulmonary disease) ([facility])    Dyspnea    Hypertension    Myocardial infarction ([facility])    01/2016   Pneumonia    Prostate cancer [facility])    s/p prostatectomy   Sleep apnea    cpap   Tobacco use    Umbilical hernia    Urinary frequency     Past Surgical History:  Procedure Laterality Date   bone graft right ankle   1972   CARDIAC CATHETERIZATION N/A 02/04/2016   Procedure: Left Heart Cath and Coronary Angiography;  Surgeon: Runell Gess, MD;  Location: [facility];  Service: Cardiovascular;  Laterality: N/A;   COLONOSCOPY WITH PROPOFOL N/A 09/19/2016   Procedure: COLONOSCOPY WITH PROPOFOL;  Surgeon: Jeani Hawking, MD;  [facility];  Service: Endoscopy;  Laterality: N/A;   COLONOSCOPY WITH PROPOFOL N/A 10/04/2021   Procedure: COLONOSCOPY WITH PROPOFOL;  Surgeon: Jeani Hawking, MD;  [facility];  Service: Gastroenterology;  Laterality: N/A;   HERNIA REPAIR     LYMPHADENECTOMY Bilateral 08/17/2014   Procedure: LYMPHADENECTOMY;  Surgeon: Heloise Purpura, MD;  Location[facility];  Service: Urology;   Laterality: Bilateral;   POLYPECTOMY  10/04/2021   Procedure: POLYPECTOMY;  Surgeon: Jeani Hawking, MD;  Location: [facility];  Service: Gastroenterology;;   right shoulder surgery   1992    ROBOT ASSISTED LAPAROSCOPIC RADICAL PROSTATECTOMY N/A 08/17/2014   Procedure: ROBOTIC ASSISTED LAPAROSCOPIC RADICAL PROSTATECTOMY LEVEL 3  AND UMBILICAL HERNIA REPAIR;  Surgeon: Heloise Purpura, MD;  [facility];  Service: Urology;  Laterality: N/A;    Family History  Problem Relation Age of Onset   Dementia Mother    Heart attack Father 62   CAD Father        s/p cabg    Social History:  reports that he has been smoking cigarettes. He has a 47.00 pack-year smoking history. He has never used smokeless tobacco. He reports current alcohol use. He reports that he does not use drugs.  Allergies:  Allergies  Allergen Reactions   Lisinopril Cough    Medications: I have reviewed the patient's current medications.  No results found for this or any previous visit (from the past 48 hour(s)).  No results found.  Review of Systems  HENT:  Negative for ear discharge, ear pain, hearing loss and tinnitus.   Eyes:  Negative for photophobia and pain.  Respiratory:  Negative for cough and shortness of breath.   Cardiovascular:  Negative for chest pain.  Gastrointestinal:  Negative for  abdominal pain, nausea and vomiting.  Genitourinary:  Negative for dysuria, flank pain, frequency and urgency.  Musculoskeletal:  Negative for arthralgias, back pain, myalgias and neck pain.  Neurological:  Negative for dizziness and headaches.  Hematological:  Does not bruise/bleed easily.  Psychiatric/Behavioral:  The patient is not nervous/anxious.    Blood pressure 133/82, pulse 82, temperature 97.8 F (36.6 C), temperature source Oral, resp. rate 16, SpO2 98 %. Physical Exam Constitutional:      General: He is not in acute distress.    Appearance: He is well-developed. He is not diaphoretic.  HENT:     Head:  Normocephalic and atraumatic.  Eyes:     General: No scleral icterus.       Right eye: No discharge.        Left eye: No discharge.     Conjunctiva/sclera: Conjunctivae normal.  Cardiovascular:     Rate and Rhythm: Normal rate and regular rhythm.  Pulmonary:     Effort: Pulmonary effort is normal. No respiratory distress.  Musculoskeletal:     Cervical back: Normal range of motion.  Feet:     Comments: Left: No traumatic wounds, ecchymosis, or rash  Gangrene and edema 5th toe  Sens DPN, SPN, TN absent  Motor EHL, ext, flex, evers 5/5  DP 2+, PT 2+, No significant edema Skin:    General: Skin is warm and dry.  Neurological:     Mental Status: He is alert.  Psychiatric:        Mood and Affect: Mood normal.        Behavior: Behavior normal.     Assessment/Plan: Left 5th toe gangrene -- Dr. Lajoyce Corners to evaluate, surgery either later today or Friday. Will need IM admission.    Freeman Caldron, PA-C Orthopedic Surgery [phone] 07/02/2022, 10:41 AM

## 2022-07-02 NOTE — ED Notes (Signed)
Pt has wound to left foot. Pt denies pain and states he does not feel his feet. Wound has worsened. He did finish a 10 day course of oral abx.

## 2022-07-02 NOTE — Progress Notes (Signed)
Orthopedic Tech Progress Note Patient Details:  John Griffin 1950/07/12 [id number]  Called in order to [employer] for a pair of GRADUATED COMPRESSION STOCKING (15-20 mmHg)  Patient ID: John Griffin, male   DOB: [birth year], 72 y.o.   MRN: [id number]  John Griffin 07/02/2022, 4:23 PM

## 2022-07-02 NOTE — ED Notes (Signed)
MD at bedside for pt admission.

## 2022-07-02 NOTE — ED Provider Notes (Signed)
[place] EMERGENCY DEPARTMENT AT [facility] Provider Note   CSN: [id number] Arrival date & time: 07/02/22  0856     History  Chief Complaint  Patient presents with   Foot Injury    John Griffin is a 72 y.o. male with HTN, CAD h/o NSTEMI, COPD, obesity, h/o prostate cancer, CHF presents with toe injury/discoloration. Patient is very hard of hearing. Accompanied by son and wife.  Patient reports stepping on a lego with L foot one week ago. Patient went to [facility] and then followed up with his family doctor but the wound appears to be worsening. Has had toe wrapped and wearing walking shoe, noted yesterday that toe began to look black in color. Patient states he has not been able to feel his feet for approximately one year so he has no pain. No f/c, systemic symptoms, otherwise feels normal.    Foot Injury      Home Medications Prior to Admission medications   Medication Sig Start Date End Date Taking? Authorizing Provider  acetaminophen (TYLENOL) 500 MG tablet Take 1,000 mg by mouth every 6 (six) hours as needed for mild pain. Reported on 05/09/2015    [provider]  albuterol (VENTOLIN HFA) 108 (90 Base) MCG/ACT inhaler Inhale 2 puffs into the lungs every 4 (four) hours as needed for wheezing or shortness of breath. 11/27/20   Parrett, Virgel Bouquet, NP  aspirin 81 MG chewable tablet Chew 1 tablet (81 mg total) by mouth daily. 02/07/16   Strader, Lennart Pall, PA-C  atorvastatin (LIPITOR) 80 MG tablet TAKE 1 TABLET BY MOUTH EVERY DAY 03/10/22   Runell Gess, MD  calcium carbonate (TUMS EX) 750 MG chewable tablet Chew 1 tablet by mouth as needed for heartburn.     [provider]  Chlorpheniramine-DM (CORICIDIN HBP COUGH/COLD PO) Take 1 tablet by mouth in the morning and at bedtime. Sinus congestion.    [provider]  clopidogrel (PLAVIX) 75 MG tablet TAKE 1 TABLET BY MOUTH EVERY DAY 11/26/21   Runell Gess, MD  fluticasone [facility]) 50  MCG/ACT nasal spray Place 2 sprays into both nostrils daily as needed (nasal allergies).    [provider]  furosemide (LASIX) 20 MG tablet TAKE 1 TABLET BY MOUTH EVERY DAY 06/24/22   Runell Gess, MD  isosorbide mononitrate (IMDUR) 30 MG 24 hr tablet TAKE 1 TABLET BY MOUTH EVERY DAY 09/16/21   Runell Gess, MD  KLOR-CON M10 10 MEQ tablet TAKE 1 TABLET BY MOUTH EVERY DAY 06/04/22   Runell Gess, MD  losartan (COZAAR) 25 MG tablet Take 1 tablet (25 mg total) by mouth daily. 04/04/22   Runell Gess, MD  metFORMIN (GLUCOPHAGE) 500 MG tablet Take 500 mg by mouth in the morning and at bedtime. 01/10/19   [provider]  metoprolol tartrate (LOPRESSOR) 25 MG tablet Take 1 tablet (25 mg total) by mouth 2 (two) times daily. 09/10/21   Runell Gess, MD  Na Sulfate-K Sulfate-Mg Sulf 17.5-3.13-1.6 GM/177ML SOLN Take 354 mLs by mouth as directed. 09/18/21   [provider]  nitroGLYCERIN (NITROSTAT) 0.4 MG SL tablet Place 1 tablet (0.4 mg total) under the tongue every 5 (five) minutes x 3 doses as needed for chest pain. 05/08/17   Abelino Derrick, PA-C      Allergies    Lisinopril    Review of Systems   Review of Systems A 10 point review of systems was performed and is negative unless  otherwise reported in HPI.  Physical Exam Updated Vital Signs BP 133/82 (BP Location: Right Arm)   Pulse 81   Temp 97.8 F (36.6 C) (Oral)   Resp 14   SpO2 98%  Physical Exam General: Normal appearing obese male, lying in bed.  HEENT: Sclera anicteric, MMM, trachea midline.  Cardiology: RRR, no murmurs/rubs/gallops. BL radial and DP pulses equal bilaterally.  Resp: Normal respiratory rate and effort. CTAB, no wheezes, rhonchi, crackles.  Abd: Soft, non-tender, non-distended. No rebound tenderness or guarding.  GU: Deferred. MSK: L 5th toe is black in color with skin sloughing off. Mild surrounding erythema and onto left 2-4th toes. L 4th toe w/ ecchymosis on medial  side. See media below. No sensation in BL feet. BL feet with intact DP/PT pulses.  Skin: warm, dry.  Neuro: A&Ox4, CNs II-XII grossly intact. MAEs.  Psych: Normal mood and affect.            ED Results / Procedures / Treatments   Labs (all labs ordered are listed, but only abnormal results are displayed) Labs Reviewed  COMPREHENSIVE METABOLIC PANEL - Abnormal; Notable for the following components:      Result Value   Glucose, Bld 121 (*)    Albumin 3.4 (*)    AST 47 (*)    ALT 54 (*)    Alkaline Phosphatase 139 (*)    All other components within normal limits  CBC WITH DIFFERENTIAL/PLATELET  PROTIME-INR  APTT  TYPE AND SCREEN    EKG None  Radiology DG Foot Complete Left  Result Date: 07/02/2022 CLINICAL DATA:  Fifth toe wound and ischemia with increasing pain and wound size after fall 9 days ago EXAM: LEFT FOOT - COMPLETE 3 VIEW COMPARISON:  Left foot radiographs dated 07/06/2018 FINDINGS: There is no evidence of fracture or dislocation. Degenerative changes of the great toe metatarsophalangeal joint. Soft tissue irregularity of the distal small toe. No definite underlying cortical erosion of the small toe. IMPRESSION: Soft tissue irregularity of the distal small toe in keeping with reported wound. No definite underlying cortical erosion of the small toe to suggest osteomyelitis. Electronically Signed   By: Agustin Cree M.D.   On: 07/02/2022 10:44    Procedures Procedures    Medications Ordered in ED Medications - No data to display  ED Course/ Medical Decision Making/ A&P                          Medical Decision Making Amount and/or Complexity of Data Reviewed Labs: ordered. Radiology: ordered. Decision-making details documented in ED Course.  Risk Decision regarding hospitalization.    This patient presents to the ED for concern of toe discoloration, this involves an extensive number of treatment options, and is a complaint that carries with it a high risk of  complications and morbidity.  I considered the following differential and admission for this acute, potentially life threatening condition.   MDM:    Greatest concern for ischemia of the L 5th toe given black in color, progressive. W/ h/o prediabetes. He has intact DP/PT pulses otherwise and no crepitus, lower c/f nec fasc or more proximal arterial ischemia. No compartment syndrome. L 4th toe also w/ ecchymosis, could have possible ischemia as well. Will obtain XR and labs and consult to orthopedic surgery. Dale  will come to see patient.  Clinical Course as of 07/02/22 1152  Wed Jul 02, 2022  1107 DG Foot Complete Left FINDINGS: There is no evidence  of fracture or dislocation. Degenerative changes of the great toe metatarsophalangeal joint. Soft tissue irregularity of the distal small toe. No definite underlying cortical erosion of the small toe.  IMPRESSION: Soft tissue irregularity of the distal small toe in keeping with reported wound. No definite underlying cortical erosion of the small toe to suggest osteomyelitis.    My interp: XR shows soft tissue irregularity, no signs of SQ air or osteo. No obvious fracture of toes as patient had reported. [HN]  1140 Dr. Lajoyce Corners will see the patient this after noon. Will likely have amputation on Friday. Consulted to medicine for admission. [HN]  1141 CMP shows mild transaminitis which patient has a history of.  CBC with no leukocytosis or anemia coags within normal limits. [HN]  1152 Admitted to medicine [HN]    Clinical Course User Index [HN] Loetta Rough, MD    Labs: I Ordered, and personally interpreted labs.  The pertinent results include:  those listed above  Imaging Studies ordered: I ordered imaging studies including left foot xray I independently visualized and interpreted imaging. I agree with the radiologist interpretation  Additional history obtained from family at bedside, chart review.     Reevaluation: After the interventions noted above, I reevaluated the patient and found that they have :stayed the same  Social Determinants of Health: Patient lives independently   Disposition:  Admitted to internal medicine with ortho following  Co morbidities that complicate the patient evaluation  Past Medical History:  Diagnosis Date   Arthritis    hands  and shoulders   CAD (coronary artery disease)    a. 01/2016: NSTEMI with cath showing 100% Prox Cx stenosis, 70% Prox LAD, 75% 1st Diag, and 60-70% RCA stenosis with medical therapy pursued as the LCx had collateral flow noted   COPD (chronic obstructive pulmonary disease) (HCC)    Dyspnea    Hypertension    Myocardial infarction (HCC)    01/2016   Pneumonia    Prostate cancer (HCC)    s/p prostatectomy   Sleep apnea    cpap   Tobacco use    Umbilical hernia    Urinary frequency      Medicines No orders of the defined types were placed in this encounter.   I have reviewed the patients home medicines and have made adjustments as needed  Problem List / ED Course: Problem List Items Addressed This Visit   None Visit Diagnoses     Ischemia of toe    -  Primary                   This note was created using dictation software, which may contain spelling or grammatical errors.    Loetta Rough, MD 07/02/22 1153

## 2022-07-02 NOTE — Progress Notes (Signed)
Patient with home CPAP for use, safety check completed. Patient will self place when ready.

## 2022-07-02 NOTE — ED Notes (Signed)
Patient transported to X-ray 

## 2022-07-02 NOTE — ED Notes (Signed)
ED TO INPATIENT HANDOFF REPORT  ED Nurse Name and Phone #: [phone]  S Name/Age/Gender John Griffin 72 y.o. male Room/Bed: 002C/002C  Code Status   Code Status: Full Code  Home/SNF/Other Home Patient oriented to: self, place, time, and situation Is this baseline? Yes   Triage Complete: Triage complete  Chief Complaint Gangrene of toe [facility]) [I96]  Triage Note Patient reports stepping on a lego with L foot one week ago. Patient went to [facility] and then followed up with his family doctor but the wound appears to be worsening. Patient states he has not been able to feel his feet for approximately one year.    Allergies Allergies  Allergen Reactions   Lisinopril Cough    Level of Care/Admitting Diagnosis ED Disposition     ED Disposition  Admit   Condition  --   Comment  Hospital Area: [facility] [[id number]]  Level of Care: Med-Surg [16]  May place patient in observation at [facility] or [facility] if equivalent level of care is available:: No  Covid Evaluation: Asymptomatic - no recent exposure (last 10 days) testing not required  Diagnosis: Gangrene of toe [facility]) [[id number]]  Admitting Physician: Earl Lagos [[id number]]  Attending Physician: Earl Lagos [phone]          B Medical/Surgery History Past Medical History:  Diagnosis Date   Arthritis    hands  and shoulders   CAD (coronary artery disease)    a. 01/2016: NSTEMI with cath showing 100% Prox Cx stenosis, 70% Prox LAD, 75% 1st Diag, and 60-70% RCA stenosis with medical therapy pursued as the LCx had collateral flow noted   COPD (chronic obstructive pulmonary disease) (HCC)    Dyspnea    Hypertension    Myocardial infarction (HCC)    01/2016   Pneumonia    Prostate cancer (HCC)    s/p prostatectomy   Sleep apnea    cpap   Tobacco use    Umbilical hernia    Urinary frequency    Past Surgical History:  Procedure Laterality Date   bone graft right ankle   1972    CARDIAC CATHETERIZATION N/A 02/04/2016   Procedure: Left Heart Cath and Coronary Angiography;  Surgeon: Runell Gess, MD;  Location: [facility] INVASIVE CV LAB;  Service: Cardiovascular;  Laterality: N/A;   COLONOSCOPY WITH PROPOFOL N/A 09/19/2016   Procedure: COLONOSCOPY WITH PROPOFOL;  Surgeon: Jeani Hawking, MD;  Location: [facility] ENDOSCOPY;  Service: Endoscopy;  Laterality: N/A;   COLONOSCOPY WITH PROPOFOL N/A 10/04/2021   Procedure: COLONOSCOPY WITH PROPOFOL;  Surgeon: Jeani Hawking, MD;  Location: [facility] ENDOSCOPY;  Service: Gastroenterology;  Laterality: N/A;   HERNIA REPAIR     LYMPHADENECTOMY Bilateral 08/17/2014   Procedure: LYMPHADENECTOMY;  Surgeon: Heloise Purpura, MD;  Location: [facility] ORS;  Service: Urology;  Laterality: Bilateral;   POLYPECTOMY  10/04/2021   Procedure: POLYPECTOMY;  Surgeon: Jeani Hawking, MD;  Location: [facility];  Service: Gastroenterology;;   right shoulder surgery   1992    ROBOT ASSISTED LAPAROSCOPIC RADICAL PROSTATECTOMY N/A 08/17/2014   Procedure: ROBOTIC ASSISTED LAPAROSCOPIC RADICAL PROSTATECTOMY LEVEL 3  AND UMBILICAL HERNIA REPAIR;  Surgeon: Heloise Purpura, MD;  Location: [facility] ORS;  Service: Urology;  Laterality: N/A;     A IV Location/Drains/Wounds Patient Lines/Drains/Airways Status     Active Line/Drains/Airways     Name Placement date Placement time Site Days   Peripheral IV 07/02/22 20 G Right Antecubital 07/02/22  1031  Antecubital  less than 1   Incision (  Closed) 08/17/14 Perineum Other (Comment) 08/17/14  1547  -- 2876   Incision (Closed) 08/17/14 Abdomen Other (Comment) 08/17/14  1547  -- 2876   Incision - 6 Ports Abdomen 1: Left;Lateral;Lower 2: Left;Lateral;Upper 3: Umbilicus;Superior 4: Right;Lateral;Lower 5: Right;Medial;Upper 6: Right;Lateral;Upper 08/17/14  1310  -- 2876            Intake/Output Last 24 hours No intake or output data in the 24 hours ending 07/02/22 1351  Labs/Imaging Results for orders placed or performed during the hospital  encounter of 07/02/22 (from the past 48 hour(s))  CBC with Differential     Status: None   Collection Time: 07/02/22  9:53 AM  Result Value Ref Range   WBC 9.1 4.0 - 10.5 K/uL   RBC 4.98 4.22 - 5.81 MIL/uL   Hemoglobin 16.0 13.0 - 17.0 g/dL   HCT 16.1 09.6 - 04.5 %   MCV 96.8 80.0 - 100.0 fL   MCH 32.1 26.0 - 34.0 pg   MCHC 33.2 30.0 - 36.0 g/dL   RDW 40.9 81.1 - 91.4 %   Platelets 200 150 - 400 K/uL   nRBC 0.0 0.0 - 0.2 %   Neutrophils Relative % 67 %   Neutro Abs 6.0 1.7 - 7.7 K/uL   Lymphocytes Relative 21 %   Lymphs Abs 1.9 0.7 - 4.0 K/uL   Monocytes Relative 9 %   Monocytes Absolute 0.8 0.1 - 1.0 K/uL   Eosinophils Relative 2 %   Eosinophils Absolute 0.2 0.0 - 0.5 K/uL   Basophils Relative 1 %   Basophils Absolute 0.1 0.0 - 0.1 K/uL   Immature Granulocytes 0 %   Abs Immature Granulocytes 0.03 0.00 - 0.07 K/uL    Comment: Performed at [facility], [address], [place], [place] [address]  Comprehensive metabolic panel     Status: Abnormal   Collection Time: 07/02/22  9:53 AM  Result Value Ref Range   Sodium 137 135 - 145 mmol/L   Potassium 3.9 3.5 - 5.1 mmol/L   Chloride 102 98 - 111 mmol/L   CO2 26 22 - 32 mmol/L   Glucose, Bld 121 (H) 70 - 99 mg/dL    Comment: Glucose reference range applies only to samples taken after fasting for at least 8 hours.   BUN 13 8 - 23 mg/dL   Creatinine, Ser 6.21 0.61 - 1.24 mg/dL   Calcium 9.0 8.9 - 30.8 mg/dL   Total Protein 6.6 6.5 - 8.1 g/dL   Albumin 3.4 (L) 3.5 - 5.0 g/dL   AST 47 (H) 15 - 41 U/L   ALT 54 (H) 0 - 44 U/L   Alkaline Phosphatase 139 (H) 38 - 126 U/L   Total Bilirubin 1.0 0.3 - 1.2 mg/dL   GFR, Estimated >65 >78 mL/min    Comment: (NOTE) Calculated using the CKD-EPI Creatinine Equation (2021)    Anion gap 9 5 - 15    Comment: Performed at [facility], [address], [place], [place] [address]  Protime-INR     Status: None   Collection Time: 07/02/22  9:53 AM  Result Value Ref Range    Prothrombin Time 13.4 11.4 - 15.2 seconds   INR 1.0 0.8 - 1.2    Comment: (NOTE) INR goal varies based on device and disease states. Performed at [facility], [address] [place], [place] [address]   APTT     Status: None   Collection Time: 07/02/22  9:53 AM  Result Value Ref Range   aPTT 31 24 - 36 seconds    Comment: Performed at [facility], [address], [place], [place] [address]  Type and screen [facility]     Status: None   Collection Time: 07/02/22 10:27 AM  Result Value Ref Range   ABO/RH(D) A NEG    Antibody Screen NEG    Sample Expiration      07/05/2022,2359 Performed at [facility], [address], [place], [place] [address]    DG Foot Complete Left  Result Date: 07/02/2022 CLINICAL DATA:  Fifth toe wound and ischemia with increasing pain and wound size after fall 9 days ago EXAM: LEFT FOOT - COMPLETE 3 VIEW COMPARISON:  Left foot radiographs dated 07/06/2018 FINDINGS: There is no evidence of fracture or dislocation. Degenerative changes of the great toe metatarsophalangeal joint. Soft tissue irregularity of the distal small toe. No definite underlying cortical erosion of the small toe. IMPRESSION: Soft tissue irregularity of the distal small toe in keeping with reported wound. No definite underlying cortical erosion of the small toe to suggest osteomyelitis. Electronically Signed   By: Agustin Cree M.D.   On: 07/02/2022 10:44    Pending Labs Unresulted Labs (From admission, onward)    None       Vitals/Pain Today's Vitals   07/02/22 1130 07/02/22 1230 07/02/22 1345 07/02/22 1348  BP:  121/65    Pulse: 81 77 79   Resp: 14 18 20    Temp:    97.8 F (36.6 C)  TempSrc:    Oral  SpO2: 98% 97% 96%   PainSc:        Isolation Precautions No active isolations  Medications Medications  enoxaparin (LOVENOX) injection 40 mg (40 mg Subcutaneous Not Given 07/02/22 1323)    Mobility walks with device     Focused  Assessments Pt has moderate bilateral pedal pulses Left necrotic toe Neuropathy - decreased sensation begins at the lower calf/shin  R Recommendations: See Admitting Provider Note  Report given to:   Additional Notes:   Unsure about procedure time.

## 2022-07-02 NOTE — Consult Note (Signed)
ORTHOPAEDIC CONSULTATION  REQUESTING PHYSICIAN: Earl Lagos, MD  Chief Complaint: Gangrenous changes left little toe.  HPI: John Griffin is a 72 y.o. male who presents with dry gangrene left little toe.  Patient states this started with blunt trauma to the little toe.  Patient has a history of prediabetes as well as tobacco use.  Patient has had venous swelling in both lower extremities.  Past Medical History:  Diagnosis Date   Arthritis    hands  and shoulders   CAD (coronary artery disease)    a. 01/2016: NSTEMI with cath showing 100% Prox Cx stenosis, 70% Prox LAD, 75% 1st Diag, and 60-70% RCA stenosis with medical therapy pursued as the LCx had collateral flow noted   COPD (chronic obstructive pulmonary disease) (HCC)    Dyspnea    Hypertension    Myocardial infarction (HCC)    01/2016   Pneumonia    Prostate cancer [facility])    s/p prostatectomy   Sleep apnea    cpap   Tobacco use    Umbilical hernia    Urinary frequency    Past Surgical History:  Procedure Laterality Date   bone graft right ankle   1972   CARDIAC CATHETERIZATION N/A 02/04/2016   Procedure: Left Heart Cath and Coronary Angiography;  Surgeon: Runell Gess, MD;  Location: [facility] INV[facility];  Service: Cardiovascular;  Laterality: N/A;   COLONOSCOPY WITH PROPOFOL N/A 09/19/2016   Procedure: COLONOSCOPY WITH PROPOFOL;  Surgeon: Jeani Hawking, MD;  Location: [facility] ENDOSCOPY;  Service: Endoscopy;  Laterality: N/A;   COLONOSCOPY WITH PROPOFOL N/A 10/04/2021   Procedure: COLONOSCOPY WITH PROPOFOL;  Surgeon: Jeani Hawking, MD;  Location: [facility] ENDOSCOPY;  Service: Gastroenterology;  Laterality: N/A;   HERNIA REPAIR     LYMPHADENECTOMY Bilateral 08/17/2014   Procedure: LYMPHADENECTOMY;  Surgeon: Heloise Purpura, MD;  Location: [facility] ORS;  Service: Urology;  Laterality: Bilateral;   POLYPECTOMY  10/04/2021   Procedure: POLYPECTOMY;  Surgeon: Jeani Hawking, MD;  Location: [facility];  Service: Gastroenterology;;    right shoulder surgery   1992    ROBOT ASSISTED LAPAROSCOPIC RADICAL PROSTATECTOMY N/A 08/17/2014   Procedure: ROBOTIC ASSISTED LAPAROSCOPIC RADICAL PROSTATECTOMY LEVEL 3  AND UMBILICAL HERNIA REPAIR;  Surgeon: Heloise Purpura, MD;  Location: [facility] ORS;  Service: Urology;  Laterality: N/A;   Social History   Socioeconomic History   Marital status: Married    Spouse name: Not on file   Number of children: Not on file   Years of education: Not on file   Highest education level: Not on file  Occupational History   Not on file  Tobacco Use   Smoking status: Every Day    Packs/day: 1.00    Years: 47.00    Additional pack years: 0.00    Total pack years: 47.00    Types: Cigarettes   Smokeless tobacco: Never   Tobacco comments:    back to smoking a 1 ppd since 2018  Vaping Use   Vaping Use: Never used  Substance and Sexual Activity   Alcohol use: Yes    Alcohol/week: 0.0 standard drinks of alcohol    Comment: occasional    Drug use: No   Sexual activity: Yes  Other Topics Concern   Not on file  Social History Narrative   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 Allyson Sabal with [facility]   Social Determinants of Health   Financial Resource Strain:  Not on file  Food Insecurity: No Food Insecurity (07/02/2022)   Hunger Vital Sign    Worried About Running Out of Food in the Last Year: Never true    Ran Out of Food in the Last Year: Never true  Transportation Needs: No Transportation Needs (07/02/2022)   PRAPARE - Administrator, Civil Service (Medical): No    Lack of Transportation (Non-Medical): No  Physical Activity: Not on file  Stress: Not on file  Social Connections: Not on file   Family History  Problem Relation Age of Onset   Dementia Mother    Heart attack Father 61   CAD Father        s/p cabg   - negative except otherwise stated in the family history section Allergies  Allergen Reactions    Lisinopril Cough   Prior to Admission medications   Medication Sig Start Date End Date Taking? Authorizing Provider  acetaminophen (TYLENOL) 500 MG tablet Take 1,000 mg by mouth every 6 (six) hours as needed for mild pain. Reported on 05/09/2015    [provider]  albuterol (VENTOLIN HFA) 108 (90 Base) MCG/ACT inhaler Inhale 2 puffs into the lungs every 4 (four) hours as needed for wheezing or shortness of breath. 11/27/20   Parrett, Virgel Bouquet, NP  aspirin 81 MG chewable tablet Chew 1 tablet (81 mg total) by mouth daily. 02/07/16   Strader, Lennart Pall, PA-C  atorvastatin (LIPITOR) 80 MG tablet TAKE 1 TABLET BY MOUTH EVERY DAY 03/10/22   Runell Gess, MD  calcium carbonate (TUMS EX) 750 MG chewable tablet Chew 1 tablet by mouth as needed for heartburn.     [provider]  Chlorpheniramine-DM (CORICIDIN HBP COUGH/COLD PO) Take 1 tablet by mouth in the morning and at bedtime. Sinus congestion.    [provider]  clindamycin (CLEOCIN) 300 MG capsule Take 300 mg by mouth 2 (two) times daily. 06/23/22   [provider]  clopidogrel (PLAVIX) 75 MG tablet TAKE 1 TABLET BY MOUTH EVERY DAY 11/26/21   Runell Gess, MD  fluticasone [facility]) 50 MCG/ACT nasal spray Place 2 sprays into both nostrils daily as needed (nasal allergies).    [provider]  furosemide (LASIX) 20 MG tablet TAKE 1 TABLET BY MOUTH EVERY DAY 06/24/22   Runell Gess, MD  isosorbide mononitrate (IMDUR) 30 MG 24 hr tablet TAKE 1 TABLET BY MOUTH EVERY DAY 09/16/21   Runell Gess, MD  KLOR-CON M10 10 MEQ tablet TAKE 1 TABLET BY MOUTH EVERY DAY 06/04/22   Runell Gess, MD  losartan (COZAAR) 25 MG tablet Take 1 tablet (25 mg total) by mouth daily. 04/04/22   Runell Gess, MD  metFORMIN (GLUCOPHAGE) 500 MG tablet Take 500 mg by mouth in the morning and at bedtime. 01/10/19   [provider]  metoprolol tartrate (LOPRESSOR) 25 MG tablet Take 1 tablet (25 mg total) by mouth  2 (two) times daily. 09/10/21   Runell Gess, MD  mupirocin ointment (BACTROBAN) 2 % Apply 1 Application topically 3 (three) times daily. 06/23/22 07/03/22  [provider]  Na Sulfate-K Sulfate-Mg Sulf 17.5-3.13-1.6 GM/177ML SOLN Take 354 mLs by mouth as directed. 09/18/21   [provider]  nitroGLYCERIN (NITROSTAT) 0.4 MG SL tablet Place 1 tablet (0.4 mg total) under the tongue every 5 (five) minutes x 3 doses as needed for chest pain. 05/08/17   Abelino Derrick, PA-C   DG Foot Complete Left  Result Date:  07/02/2022 CLINICAL DATA:  Fifth toe wound and ischemia with increasing pain and wound size after fall 9 days ago EXAM: LEFT FOOT - COMPLETE 3 VIEW COMPARISON:  Left foot radiographs dated 07/06/2018 FINDINGS: There is no evidence of fracture or dislocation. Degenerative changes of the great toe metatarsophalangeal joint. Soft tissue irregularity of the distal small toe. No definite underlying cortical erosion of the small toe. IMPRESSION: Soft tissue irregularity of the distal small toe in keeping with reported wound. No definite underlying cortical erosion of the small toe to suggest osteomyelitis. Electronically Signed   By: Agustin Cree M.D.   On: 07/02/2022 10:44   - pertinent xrays, CT, MRI studies were reviewed and independently interpreted  Positive ROS: All other systems have been reviewed and were otherwise negative with the exception of those mentioned in the HPI and as above.  Physical Exam: General: Alert, no acute distress Psychiatric: Patient is competent for consent with normal mood and affect Lymphatic: No axillary or cervical lymphadenopathy Cardiovascular: No pedal edema Respiratory: No cyanosis, no use of accessory musculature GI: No organomegaly, abdomen is soft and non-tender    Images:  @ENCIMAGES @  Labs:  Lab Results  Component Value Date   HGBA1C 6.0 (H) 02/03/2016   HGBA1C 5.8 (H) 02/24/2015   REPTSTATUS 07/08/2018 FINAL 07/05/2018    GRAMSTAIN  07/05/2018    RARE WBC PRESENT, PREDOMINANTLY MONONUCLEAR FEW GRAM POSITIVE COCCI MODERATE GRAM NEGATIVE RODS    CULT  07/05/2018    MODERATE ALCALIGENES FAECALIS WITHIN MIXED GROWTH Performed at [facility], [address] [place], [place] [address]    Ritta Slot 07/05/2018    Lab Results  Component Value Date   ALBUMIN 3.4 (L) 07/02/2022   ALBUMIN 4.0 06/04/2021   ALBUMIN 4.2 05/25/2020        Latest Ref Rng & Units 07/02/2022    9:53 AM 05/08/2017    9:42 AM 02/05/2016    2:24 AM  CBC EXTENDED  WBC 4.0 - 10.5 K/uL 9.1  10.6  14.3   RBC 4.22 - 5.81 MIL/uL 4.98  5.18  4.70   Hemoglobin 13.0 - 17.0 g/dL 60.4  54.0  98.1   HCT 39.0 - 52.0 % 48.2  47.7  44.3   Platelets 150 - 400 K/uL 200  235  226   NEUT# 1.7 - 7.7 K/uL 6.0     Lymph# 0.7 - 4.0 K/uL 1.9       Neurologic: Patient does not have protective sensation bilateral lower extremities.   MUSCULOSKELETAL:   Skin: Examination there is venous insufficiency of both legs with ulceration of the right lower extremity with pitting edema both legs.  Patient has a strong dorsalis pedis pulse on the left.  He has dry gangrenous changes of the left little toe.  There is no ascending cellulitis.  Hemoglobin 16.0 white cell count 9.1.  Hemoglobin A1c 6.0.  Assessment: Assessment prediabetes with history of tobacco use with dry gangrenous changes of the left little toe with a good dorsalis pedis pulse.  Plan: Plan: Will plan for a amputation of the left foot fifth ray on Friday.  Risks and benefits were discussed including risk of the wound not healing need for additional surgery.  We discussed smoking cessation as well as carb modified diet.  Thank you for the consult and the opportunity to see Mr. Nhat Leibman, MD [facility] [phone] 5:03 PM

## 2022-07-02 NOTE — H&P (Addendum)
NAME:  John Griffin, MRN:  [id number], DOB:  October 23, 1950, LOS: 0 ADMISSION DATE:  07/02/2022, Primary: John Banner, MD  CHIEF COMPLAINT:  toe injury   Medical Service: Internal Medicine Teaching Service         Attending Physician: Dr. Earl Lagos, MD    First Contact: Dr. Allena Katz Pager: [phone]  Second Contact: Dr. Ned Card Pager: [phone]       After Hours (After 5p/  First Contact Pager: [phone]  weekends / holidays): Second Contact Pager: [phone]   HISTORY OF PRESENT ILLNESS   John Griffin is 71yo person with history of NSTEMI, coronary artery disease, obstructive sleep apnea, hypertension, tobacco use disorder, COPD, prediabetes, prostate cancer s/p prostatectomy presenting to [facility] after a toe injury. Patient reports he stepped on a Lego a week and a half ago and cut the bottom of his 5th toe. A few days after this he was evaluated at an urgent care and had X-ray done, which revealed negative fracture. He received antibiotics and was given instructions for wound care. He followed up a few days later with his primary care office who referred him to orthopedic surgery. At that time the dressing on his toe was changed. This last Sunday (6/9) patient's son changed the dressing and his toes were pink. Yesterday 6/11 patient's wife changed the dressing which revealed black 5th toe. With the urge of patient's son, patient came to the Emergency Department this morning.    PCP: John Banner, MD  ED COURSE   Patient arrived to [facility] afebrile, hemodynamically stable sating well on room air. Initial lab work overall unrevealing, no leukocytosis, mild LFT elevation. Orthopedic surgery consulted, planning for amputation. IMTS subsequently consulted for admission.   PAST MEDICAL HISTORY   He,  has a past medical history of Arthritis, CAD (coronary artery disease), COPD (chronic obstructive pulmonary disease) (HCC), Dyspnea, Hypertension, Myocardial infarction (HCC), Pneumonia, Prostate  cancer (HCC), Sleep apnea, Tobacco use, Umbilical hernia, and Urinary frequency.   HOME MEDICATIONS   Prior to Admission medications   Medication Sig Start Date End Date Taking? Authorizing Provider  acetaminophen (TYLENOL) 500 MG tablet Take 1,000 mg by mouth every 6 (six) hours as needed for mild pain. Reported on 05/09/2015    [provider]  albuterol (VENTOLIN HFA) 108 (90 Base) MCG/ACT inhaler Inhale 2 puffs into the lungs every 4 (four) hours as needed for wheezing or shortness of breath. 11/27/20   Parrett, Virgel Bouquet, NP  aspirin 81 MG chewable tablet Chew 1 tablet (81 mg total) by mouth daily. 02/07/16   Strader, Lennart Pall, PA-C  atorvastatin (LIPITOR) 80 MG tablet TAKE 1 TABLET BY MOUTH EVERY DAY 03/10/22   Runell Gess, MD  calcium carbonate (TUMS EX) 750 MG chewable tablet Chew 1 tablet by mouth as needed for heartburn.     [provider]  Chlorpheniramine-DM (CORICIDIN HBP COUGH/COLD PO) Take 1 tablet by mouth in the morning and at bedtime. Sinus congestion.    [provider]  clindamycin (CLEOCIN) 300 MG capsule Take 300 mg by mouth 2 (two) times daily. 06/23/22   [provider]  clopidogrel (PLAVIX) 75 MG tablet TAKE 1 TABLET BY MOUTH EVERY DAY 11/26/21   Runell Gess, MD  fluticasone [facility]) 50 MCG/ACT nasal spray Place 2 sprays into both nostrils daily as needed (nasal allergies).    [provider]  furosemide (LASIX) 20 MG tablet TAKE 1 TABLET BY MOUTH EVERY DAY 06/24/22   Runell Gess, MD  isosorbide mononitrate (IMDUR) 30 MG 24 hr tablet TAKE 1 TABLET BY MOUTH EVERY DAY 09/16/21   Runell Gess, MD  KLOR-CON M10 10 MEQ tablet TAKE 1 TABLET BY MOUTH EVERY DAY 06/04/22   Runell Gess, MD  losartan (COZAAR) 25 MG tablet Take 1 tablet (25 mg total) by mouth daily. 04/04/22   Runell Gess, MD  metFORMIN (GLUCOPHAGE) 500 MG tablet Take 500 mg by mouth in the morning and at bedtime. 01/10/19   [provider]   metoprolol tartrate (LOPRESSOR) 25 MG tablet Take 1 tablet (25 mg total) by mouth 2 (two) times daily. 09/10/21   Runell Gess, MD  mupirocin ointment (BACTROBAN) 2 % Apply 1 Application topically 3 (three) times daily. 06/23/22 07/03/22  [provider]  Na Sulfate-K Sulfate-Mg Sulf 17.5-3.13-1.6 GM/177ML SOLN Take 354 mLs by mouth as directed. 09/18/21   [provider]  nitroGLYCERIN (NITROSTAT) 0.4 MG SL tablet Place 1 tablet (0.4 mg total) under the tongue every 5 (five) minutes x 3 doses as needed for chest pain. 05/08/17   Abelino Derrick, PA-C    ALLERGIES   Allergies as of 07/02/2022 - Review Complete 07/02/2022  Allergen Reaction Noted   Lisinopril Cough 09/30/2021    SOCIAL HISTORY   Patient lives with his wife, John Griffin, in [place]. 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 Currently smokes 1 pack per day of cigarettes, has been smoking for 50 years. Occasional alcohol use, no drug use. Typically uses cane with ambulation.   FAMILY HISTORY   His family history includes CAD in his father; Dementia in his mother; Heart attack (age of onset: 28) in his father.   REVIEW OF SYSTEMS   ROS per history of present illness.  PHYSICAL EXAMINATION   Blood pressure 137/72, pulse 88, temperature 97.6 F (36.4 C), temperature source Oral, resp. rate 18, height 6\' 4"  (1.93 m), weight (!) 142.4 kg, SpO2 96 %.    Filed Weights   07/02/22 1441  Weight: (!) 142.4 kg   GENERAL: Elderly person resting in bed in no acute distress HENT: Normocephalic, atraumatic. Moist mucous membranes. EYES: Vision grossly in tact. No scleral icterus or conjunctival injection. CV: Regular rate, rhythm. No murmurs appreciated. 2+ dorsalis pedis pulses bilaterally.  PULM: Normal work of breathing on room air. Clear to auscultation bilaterally. GI: Abdomen soft, non-tender, non-distended. Normoactive bowel sounds.  MSK: Normal bulk, tone.  1-2+ bilateral pitting edema to shins.  SKIN: Warm, dry. Mild excoriations on anterior right leg. 5th L toe gangrenous without any noticeable purulence, some mild bleeding surrounding. Medial part of 4th toe with mild bruising. L toes 3-5 erythematous, not well demarcated, but not warm  NEURO: Awake, alert, conversing appropriately. Decreased sensation bilateral shins and distally. PSYCH: Normal mood, affect, speech.   SIGNIFICANT DIAGNOSTIC TESTS   L FOOT XR 07/02/22 Soft tissue irregularity of the distal small toe in keeping with reported wound. No definite underlying cortical erosion of the small toe to suggest osteomyelitis.  LABS      Latest Ref Rng & Units 07/02/2022    9:53 AM 05/08/2017    9:42 AM 02/05/2016    2:24 AM  CBC  WBC 4.0 - 10.5 K/uL 9.1  10.6  14.3   Hemoglobin 13.0 - 17.0 g/dL 57.8  46.9  62.9   Hematocrit 39.0 - 52.0 % 48.2  47.7  44.3   Platelets 150 - 400 K/uL 200  235  226  Latest Ref Rng & Units 07/02/2022    9:53 AM 05/08/2017    9:42 AM 02/06/2016    4:51 AM  BMP  Glucose 70 - 99 mg/dL 161  096  045   BUN 8 - 23 mg/dL 13  11  18    Creatinine 0.61 - 1.24 mg/dL 4.09  8.11  9.14   BUN/Creat Ratio 10 - 24  16    Sodium 135 - 145 mmol/L 137  141  138   Potassium 3.5 - 5.1 mmol/L 3.9  4.0  3.7   Chloride 98 - 111 mmol/L 102  104  101   CO2 22 - 32 mmol/L 26  23  28    Calcium 8.9 - 10.3 mg/dL 9.0  9.4  8.8     CONSULTS   Orthopedic surgery  ASSESSMENT   Montavious Debates is 71yo person with history of NSTEMI, coronary artery disease, obstructive sleep apnea, hypertension, tobacco use disorder, COPD, prediabetes, prostate cancer s/p prostatectomy admitted 6/12 with gangrene of L fifth toe.   PLAN   Principal Problem:   Gangrene of toe (HCC)  #Gangrene of L 5th toe Patient presenting with gangrenous changes of left fifth toe in setting of recent injury. Patient initially completed 7d of clindamycin, no obvious infection on my exam. He is  hemodynamically stable, no signs of systemic infection either. Appears to have good peripheral pulses on my exam as well. Orthopedic surgery has been consulted and are planning for amputation of the 5th toe. Current plan for surgery on Friday. Patient reports no pain in the foot, as he has chronic peripheral neuropathy.  - Amputation with Dr. Lajoyce Corners on Friday 6/14 - NPO @ [facility] tomorrow night - Currently holding antiplatelets  #History of NSTEMI #Coronary artery disease #Hyperlipidemia Cardiologist is Dr. Allyson Sabal. Heart cath in 2018 revealed chronic obstruction of non-dominant circumflex, non-obstructive stenosis of proximal LAD. At the time elected to treat medically. No chest pain here. Holding anti-platelets in setting of upcoming surgery. Plan to re-start post-operatively.  - Hold home aspirin and Plavix until post-op - Continue home atorvastatin 80mg  daily - Continue home metoprolol tartrate 25mg  twice daily  #Obstructive sleep apnea #COPD No respiratory issues, not hypoxic here. Per last pulmonology note does not want to initiate maintenance regimen. Continue home CPAP and albuterol. - Albuterol inhaler q4h PRN - CPAP QHS  #Hypertension Normotensive. Will continue home medications.  - Imdur 30mg  daily - Losartan 25mg  daily  #Lower extremity edema Patient is on chronic lasix for this, however history more consistent with venous insufficiency. Upon chart review I do see his last EF in 2018 was 45-50%. We will continue with home lasix here, have him follow-up with his primary care and cardiologist for further management.  - Furosemide 20mg  daily  #Elevated LFT's AST/ALT mildly elevated in 47/54, not far from baseline. Suspect might have NAFLD. No current abdominal symptoms. Will need outpatient follow-up. - Follow-up outpatient for further work-up  #Prediabetes Last A1c 6.1%. Sugars at goal.  - SSI  #Tobacco use disorder - Nicotine patch ordered  BEST PRACTICE   DIET: CM IVF:  na DVT PPX: lovenox BOWEL: na CODE: FULL FAM COM: Discussed with patient and his wife at bedside  DISPO: Admit patient to Observation with expected length of stay less than 2 midnights.  Evlyn Kanner, MD Internal Medicine Resident PGY-3 Pager [phone] 07/02/22 3:11 PM

## 2022-07-02 NOTE — Plan of Care (Signed)
  Problem: Education: Goal: Knowledge of General Education information will improve Description Including pain rating scale, medication(s)/side effects and non-pharmacologic comfort measures Outcome: Progressing   

## 2022-07-02 NOTE — ED Triage Notes (Signed)
Patient reports stepping on a lego with L foot one week ago. Patient went to [facility] and then followed up with his family doctor but the wound appears to be worsening. Patient states he has not been able to feel his feet for approximately one year.

## 2022-07-02 NOTE — H&P (View-Only) (Signed)
 ORTHOPAEDIC CONSULTATION  REQUESTING PHYSICIAN: Narendra, Nischal, MD  Chief Complaint: Gangrenous changes left little toe.  HPI: John Griffin is a 71 y.o. male who presents with dry gangrene left little toe.  Patient states this started with blunt trauma to the little toe.  Patient has a history of prediabetes as well as tobacco use.  Patient has had venous swelling in both lower extremities.  Past Medical History:  Diagnosis Date   Arthritis    hands  and shoulders   CAD (coronary artery disease)    a. 01/2016: NSTEMI with cath showing 100% Prox Cx stenosis, 70% Prox LAD, 75% 1st Diag, and 60-70% RCA stenosis with medical therapy pursued as the LCx had collateral flow noted   COPD (chronic obstructive pulmonary disease) (HCC)    Dyspnea    Hypertension    Myocardial infarction (HCC)    01/2016   Pneumonia    Prostate cancer (HCC)    s/p prostatectomy   Sleep apnea    cpap   Tobacco use    Umbilical hernia    Urinary frequency    Past Surgical History:  Procedure Laterality Date   bone graft right ankle   1972   CARDIAC CATHETERIZATION N/A 02/04/2016   Procedure: Left Heart Cath and Coronary Angiography;  Surgeon: Jonathan J Berry, MD;  Location: [facility];  Service: Cardiovascular;  Laterality: N/A;   COLONOSCOPY WITH PROPOFOL N/A 09/19/2016   Procedure: COLONOSCOPY WITH PROPOFOL;  Surgeon: Hung, Patrick, MD;  Location: [facility];  Service: Endoscopy;  Laterality: N/A;   COLONOSCOPY WITH PROPOFOL N/A 10/04/2021   Procedure: COLONOSCOPY WITH PROPOFOL;  Surgeon: Hung, Patrick, MD;  Location: [facility];  Service: Gastroenterology;  Laterality: N/A;   HERNIA REPAIR     LYMPHADENECTOMY Bilateral 08/17/2014   Procedure: LYMPHADENECTOMY;  Surgeon: Lester Borden, MD;  Location: [facility];  Service: Urology;  Laterality: Bilateral;   POLYPECTOMY  10/04/2021   Procedure: POLYPECTOMY;  Surgeon: Hung, Patrick, MD;  Location: [facility];  Service: Gastroenterology;;    right shoulder surgery   1992    ROBOT ASSISTED LAPAROSCOPIC RADICAL PROSTATECTOMY N/A 08/17/2014   Procedure: ROBOTIC ASSISTED LAPAROSCOPIC RADICAL PROSTATECTOMY LEVEL 3  AND UMBILICAL HERNIA REPAIR;  Surgeon: Lester Borden, MD;  Location: [facility];  Service: Urology;  Laterality: N/A;   Social History   Socioeconomic History   Marital status: Married    Spouse name: Not on file   Number of children: Not on file   Years of education: Not on file   Highest education level: Not on file  Occupational History   Not on file  Tobacco Use   Smoking status: Every Day    Packs/day: 1.00    Years: 47.00    Additional pack years: 0.00    Total pack years: 47.00    Types: Cigarettes   Smokeless tobacco: Never   Tobacco comments:    back to smoking a 1 ppd since 2018  Vaping Use   Vaping Use: Never used  Substance and Sexual Activity   Alcohol use: Yes    Alcohol/week: 0.0 standard drinks of alcohol    Comment: occasional    Drug use: No   Sexual activity: Yes  Other Topics Concern   Not on file  Social History Narrative   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 Berry with [facility]   Social Determinants of Health   Financial Resource Strain:   Not on file  Food Insecurity: No Food Insecurity (07/02/2022)   Hunger Vital Sign    Worried About Running Out of Food in the Last Year: Never true    Ran Out of Food in the Last Year: Never true  Transportation Needs: No Transportation Needs (07/02/2022)   PRAPARE - Transportation    Lack of Transportation (Medical): No    Lack of Transportation (Non-Medical): No  Physical Activity: Not on file  Stress: Not on file  Social Connections: Not on file   Family History  Problem Relation Age of Onset   Dementia Mother    Heart attack Father 50   CAD Father        s/p cabg   - negative except otherwise stated in the family history section Allergies  Allergen Reactions    Lisinopril Cough   Prior to Admission medications   Medication Sig Start Date End Date Taking? Authorizing Provider  acetaminophen (TYLENOL) 500 MG tablet Take 1,000 mg by mouth every 6 (six) hours as needed for mild pain. Reported on 05/09/2015    [provider]  albuterol (VENTOLIN HFA) 108 (90 Base) MCG/ACT inhaler Inhale 2 puffs into the lungs every 4 (four) hours as needed for wheezing or shortness of breath. 11/27/20   Parrett, Tammy S, NP  aspirin 81 MG chewable tablet Chew 1 tablet (81 mg total) by mouth daily. 02/07/16   Strader, Brittany M, PA-C  atorvastatin (LIPITOR) 80 MG tablet TAKE 1 TABLET BY MOUTH EVERY DAY 03/10/22   Berry, Jonathan J, MD  calcium carbonate (TUMS EX) 750 MG chewable tablet Chew 1 tablet by mouth as needed for heartburn.     [provider]  Chlorpheniramine-DM (CORICIDIN HBP COUGH/COLD PO) Take 1 tablet by mouth in the morning and at bedtime. Sinus congestion.    [provider]  clindamycin (CLEOCIN) 300 MG capsule Take 300 mg by mouth 2 (two) times daily. 06/23/22   [provider]  clopidogrel (PLAVIX) 75 MG tablet TAKE 1 TABLET BY MOUTH EVERY DAY 11/26/21   Berry, Jonathan J, MD  fluticasone (FLONASE) 50 MCG/ACT nasal spray Place 2 sprays into both nostrils daily as needed (nasal allergies).    [provider]  furosemide (LASIX) 20 MG tablet TAKE 1 TABLET BY MOUTH EVERY DAY 06/24/22   Berry, Jonathan J, MD  isosorbide mononitrate (IMDUR) 30 MG 24 hr tablet TAKE 1 TABLET BY MOUTH EVERY DAY 09/16/21   Berry, Jonathan J, MD  KLOR-CON M10 10 MEQ tablet TAKE 1 TABLET BY MOUTH EVERY DAY 06/04/22   Berry, Jonathan J, MD  losartan (COZAAR) 25 MG tablet Take 1 tablet (25 mg total) by mouth daily. 04/04/22   Berry, Jonathan J, MD  metFORMIN (GLUCOPHAGE) 500 MG tablet Take 500 mg by mouth in the morning and at bedtime. 01/10/19   [provider]  metoprolol tartrate (LOPRESSOR) 25 MG tablet Take 1 tablet (25 mg total) by mouth  2 (two) times daily. 09/10/21   Berry, Jonathan J, MD  mupirocin ointment (BACTROBAN) 2 % Apply 1 Application topically 3 (three) times daily. 06/23/22 07/03/22  [provider]  Na Sulfate-K Sulfate-Mg Sulf 17.5-3.13-1.6 GM/177ML SOLN Take 354 mLs by mouth as directed. 09/18/21   [provider]  nitroGLYCERIN (NITROSTAT) 0.4 MG SL tablet Place 1 tablet (0.4 mg total) under the tongue every 5 (five) minutes x 3 doses as needed for chest pain. 05/08/17   Kilroy, Luke K, PA-C   DG Foot Complete Left  Result Date:   07/02/2022 CLINICAL DATA:  Fifth toe wound and ischemia with increasing pain and wound size after fall 9 days ago EXAM: LEFT FOOT - COMPLETE 3 VIEW COMPARISON:  Left foot radiographs dated 07/06/2018 FINDINGS: There is no evidence of fracture or dislocation. Degenerative changes of the great toe metatarsophalangeal joint. Soft tissue irregularity of the distal small toe. No definite underlying cortical erosion of the small toe. IMPRESSION: Soft tissue irregularity of the distal small toe in keeping with reported wound. No definite underlying cortical erosion of the small toe to suggest osteomyelitis. Electronically Signed   By: Limin  Xu M.D.   On: 07/02/2022 10:44   - pertinent xrays, CT, MRI studies were reviewed and independently interpreted  Positive ROS: All other systems have been reviewed and were otherwise negative with the exception of those mentioned in the HPI and as above.  Physical Exam: General: Alert, no acute distress Psychiatric: Patient is competent for consent with normal mood and affect Lymphatic: No axillary or cervical lymphadenopathy Cardiovascular: No pedal edema Respiratory: No cyanosis, no use of accessory musculature GI: No organomegaly, abdomen is soft and non-tender    Images:  @ENCIMAGES@  Labs:  Lab Results  Component Value Date   HGBA1C 6.0 (H) 02/03/2016   HGBA1C 5.8 (H) 02/24/2015   REPTSTATUS 07/08/2018 FINAL 07/05/2018    GRAMSTAIN  07/05/2018    RARE WBC PRESENT, PREDOMINANTLY MONONUCLEAR FEW GRAM POSITIVE COCCI MODERATE GRAM NEGATIVE RODS    CULT  07/05/2018    MODERATE ALCALIGENES FAECALIS WITHIN MIXED GROWTH Performed at [facility], [address] [place], [place] [address]    LABORGA ALCALIGENES FAECALIS 07/05/2018    Lab Results  Component Value Date   ALBUMIN 3.4 (L) 07/02/2022   ALBUMIN 4.0 06/04/2021   ALBUMIN 4.2 05/25/2020        Latest Ref Rng & Units 07/02/2022    9:53 AM 05/08/2017    9:42 AM 02/05/2016    2:24 AM  CBC EXTENDED  WBC 4.0 - 10.5 K/uL 9.1  10.6  14.3   RBC 4.22 - 5.81 MIL/uL 4.98  5.18  4.70   Hemoglobin 13.0 - 17.0 g/dL 16.0  16.4  15.0   HCT 39.0 - 52.0 % 48.2  47.7  44.3   Platelets 150 - 400 K/uL 200  235  226   NEUT# 1.7 - 7.7 K/uL 6.0     Lymph# 0.7 - 4.0 K/uL 1.9       Neurologic: Patient does not have protective sensation bilateral lower extremities.   MUSCULOSKELETAL:   Skin: Examination there is venous insufficiency of both legs with ulceration of the right lower extremity with pitting edema both legs.  Patient has a strong dorsalis pedis pulse on the left.  He has dry gangrenous changes of the left little toe.  There is no ascending cellulitis.  Hemoglobin 16.0 white cell count 9.1.  Hemoglobin A1c 6.0.  Assessment: Assessment prediabetes with history of tobacco use with dry gangrenous changes of the left little toe with a good dorsalis pedis pulse.  Plan: Plan: Will plan for a amputation of the left foot fifth ray on Friday.  Risks and benefits were discussed including risk of the wound not healing need for additional surgery.  We discussed smoking cessation as well as carb modified diet.  Thank you for the consult and the opportunity to see John Griffin  Kristi Norment, MD [facility] [phone] 5:03 PM      

## 2022-07-03 ENCOUNTER — Observation Stay (HOSPITAL_COMMUNITY): Payer: Medicare Other

## 2022-07-03 ENCOUNTER — Inpatient Hospital Stay (HOSPITAL_COMMUNITY): Payer: Medicare Other

## 2022-07-03 DIAGNOSIS — I5022 Chronic systolic (congestive) heart failure: Secondary | ICD-10-CM | POA: Diagnosis present

## 2022-07-03 DIAGNOSIS — E1152 Type 2 diabetes mellitus with diabetic peripheral angiopathy with gangrene: Secondary | ICD-10-CM | POA: Diagnosis not present

## 2022-07-03 DIAGNOSIS — I110 Hypertensive heart disease with heart failure: Secondary | ICD-10-CM | POA: Diagnosis not present

## 2022-07-03 DIAGNOSIS — Z7902 Long term (current) use of antithrombotics/antiplatelets: Secondary | ICD-10-CM | POA: Diagnosis not present

## 2022-07-03 DIAGNOSIS — Z8546 Personal history of malignant neoplasm of prostate: Secondary | ICD-10-CM | POA: Diagnosis not present

## 2022-07-03 DIAGNOSIS — G4733 Obstructive sleep apnea (adult) (pediatric): Secondary | ICD-10-CM | POA: Diagnosis present

## 2022-07-03 DIAGNOSIS — I2510 Atherosclerotic heart disease of native coronary artery without angina pectoris: Secondary | ICD-10-CM | POA: Diagnosis not present

## 2022-07-03 DIAGNOSIS — Z7984 Long term (current) use of oral hypoglycemic drugs: Secondary | ICD-10-CM | POA: Diagnosis not present

## 2022-07-03 DIAGNOSIS — R7989 Other specified abnormal findings of blood chemistry: Secondary | ICD-10-CM

## 2022-07-03 DIAGNOSIS — Z794 Long term (current) use of insulin: Secondary | ICD-10-CM

## 2022-07-03 DIAGNOSIS — Z6838 Body mass index (BMI) 38.0-38.9, adult: Secondary | ICD-10-CM | POA: Diagnosis not present

## 2022-07-03 DIAGNOSIS — I96 Gangrene, not elsewhere classified: Secondary | ICD-10-CM

## 2022-07-03 DIAGNOSIS — E669 Obesity, unspecified: Secondary | ICD-10-CM | POA: Diagnosis present

## 2022-07-03 DIAGNOSIS — J449 Chronic obstructive pulmonary disease, unspecified: Secondary | ICD-10-CM | POA: Diagnosis present

## 2022-07-03 DIAGNOSIS — I509 Heart failure, unspecified: Secondary | ICD-10-CM | POA: Diagnosis not present

## 2022-07-03 DIAGNOSIS — Z8249 Family history of ischemic heart disease and other diseases of the circulatory system: Secondary | ICD-10-CM | POA: Diagnosis not present

## 2022-07-03 DIAGNOSIS — E785 Hyperlipidemia, unspecified: Secondary | ICD-10-CM

## 2022-07-03 DIAGNOSIS — I252 Old myocardial infarction: Secondary | ICD-10-CM | POA: Diagnosis not present

## 2022-07-03 DIAGNOSIS — Z888 Allergy status to other drugs, medicaments and biological substances status: Secondary | ICD-10-CM | POA: Diagnosis not present

## 2022-07-03 DIAGNOSIS — I83019 Varicose veins of right lower extremity with ulcer of unspecified site: Secondary | ICD-10-CM | POA: Diagnosis not present

## 2022-07-03 DIAGNOSIS — L97919 Non-pressure chronic ulcer of unspecified part of right lower leg with unspecified severity: Secondary | ICD-10-CM | POA: Diagnosis present

## 2022-07-03 DIAGNOSIS — F17210 Nicotine dependence, cigarettes, uncomplicated: Secondary | ICD-10-CM | POA: Diagnosis not present

## 2022-07-03 DIAGNOSIS — I8390 Asymptomatic varicose veins of unspecified lower extremity: Secondary | ICD-10-CM | POA: Diagnosis present

## 2022-07-03 DIAGNOSIS — Z79899 Other long term (current) drug therapy: Secondary | ICD-10-CM | POA: Diagnosis not present

## 2022-07-03 DIAGNOSIS — H9190 Unspecified hearing loss, unspecified ear: Secondary | ICD-10-CM | POA: Diagnosis present

## 2022-07-03 DIAGNOSIS — Z7982 Long term (current) use of aspirin: Secondary | ICD-10-CM | POA: Diagnosis not present

## 2022-07-03 LAB — COMPREHENSIVE METABOLIC PANEL
ALT: 54 U/L — ABNORMAL HIGH (ref 0–44)
AST: 44 U/L — ABNORMAL HIGH (ref 15–41)
Albumin: 3.5 g/dL (ref 3.5–5.0)
Alkaline Phosphatase: 145 U/L — ABNORMAL HIGH (ref 38–126)
Anion gap: 12 (ref 5–15)
BUN: 11 mg/dL (ref 8–23)
CO2: 24 mmol/L (ref 22–32)
Calcium: 8.9 mg/dL (ref 8.9–10.3)
Chloride: 102 mmol/L (ref 98–111)
Creatinine, Ser: 0.68 mg/dL (ref 0.61–1.24)
GFR, Estimated: >60 mL/min (ref >60)
Glucose, Bld: 164 mg/dL — ABNORMAL HIGH (ref 70–99)
Potassium: 3.5 mmol/L (ref 3.5–5.1)
Sodium: 138 mmol/L (ref 135–145)
Total Bilirubin: 1.1 mg/dL (ref 0.3–1.2)
Total Protein: 6.9 g/dL (ref 6.5–8.1)

## 2022-07-03 LAB — VAS US ABI WITH/WO TBI
Left ABI: 1.16
Right ABI: 1.09

## 2022-07-03 LAB — GLUCOSE, CAPILLARY
Glucose-Capillary: 123 mg/dL — ABNORMAL HIGH (ref 70–99)
Glucose-Capillary: 114 mg/dL — ABNORMAL HIGH (ref 70–99)
Glucose-Capillary: 133 mg/dL — ABNORMAL HIGH (ref 70–99)
Glucose-Capillary: 123 mg/dL — ABNORMAL HIGH (ref 70–99)

## 2022-07-03 MED ORDER — FUROSEMIDE 20 MG PO TABS
20.0000 mg | ORAL_TABLET | Freq: Every day | ORAL | Status: DC
  Administered 2022-07-03 – 2022-07-05 (×3): 20 mg via ORAL
  Filled 2022-07-03 (×3): qty 1

## 2022-07-03 MED ORDER — ISOSORBIDE MONONITRATE ER 30 MG PO TB24
30.0000 mg | ORAL_TABLET | Freq: Every day | ORAL | Status: DC
  Administered 2022-07-03 – 2022-07-05 (×3): 30 mg via ORAL
  Filled 2022-07-03 (×3): qty 1

## 2022-07-03 MED ORDER — LOSARTAN POTASSIUM 25 MG PO TABS
25.0000 mg | ORAL_TABLET | Freq: Every day | ORAL | Status: DC
  Administered 2022-07-03 – 2022-07-05 (×3): 25 mg via ORAL
  Filled 2022-07-03 (×3): qty 1

## 2022-07-03 MED ORDER — CEFAZOLIN IN SODIUM CHLORIDE 3-0.9 GM/100ML-% IV SOLN
3.0000 g | INTRAVENOUS | Status: AC
  Administered 2022-07-04: 3 g via INTRAVENOUS
  Filled 2022-07-03: qty 100

## 2022-07-03 NOTE — Anesthesia Preprocedure Evaluation (Addendum)
Anesthesia Evaluation  Patient identified by MRN, date of birth, ID band Patient awake    Reviewed: Allergy & Precautions, H&P , NPO status , Patient's Chart, lab work & pertinent test results  Airway Mallampati: II  TM Distance: >3 FB Neck ROM: Full    Dental no notable dental hx. (+) Edentulous Upper, Edentulous Lower, Dental Advisory Given   Pulmonary sleep apnea and Continuous Positive Airway Pressure Ventilation , COPD,  COPD inhaler, Current Smoker and Patient abstained from smoking.   Pulmonary exam normal breath sounds clear to auscultation       Cardiovascular Exercise Tolerance: Good hypertension, Pt. on medications and Pt. on home beta blockers + CAD, + Past MI and +CHF   Rhythm:Regular Rate:Normal     Neuro/Psych negative neurological ROS  negative psych ROS   GI/Hepatic negative GI ROS, Neg liver ROS,,,  Endo/Other  diabetes, Type 2, Oral Hypoglycemic Agents  Morbid obesity  Renal/GU negative Renal ROS  negative genitourinary   Musculoskeletal  (+) Arthritis ,    Abdominal   Peds  Hematology negative hematology ROS (+)   Anesthesia Other Findings   Reproductive/Obstetrics negative OB ROS                             Anesthesia Physical Anesthesia Plan  ASA: 3  Anesthesia Plan: MAC   Post-op Pain Management: Minimal or no pain anticipated   Induction: Intravenous  PONV Risk Score and Plan: 1 and Propofol infusion and Ondansetron  Airway Management Planned: Natural Airway and Simple Face Mask  Additional Equipment:   Intra-op Plan:   Post-operative Plan:   Informed Consent: I have reviewed the patients History and Physical, chart, labs and discussed the procedure including the risks, benefits and alternatives for the proposed anesthesia with the patient or authorized representative who has indicated his/her understanding and acceptance.     Dental advisory  given  Plan Discussed with: CRNA  Anesthesia Plan Comments:        Anesthesia Quick Evaluation

## 2022-07-03 NOTE — TOC Initial Note (Signed)
Transition of Care [facility]) - Initial/Assessment Note    Patient Details  Name: John Griffin MRN: [id number] Date of Birth: 30-May-1950  Transition of Care [facility]) CM/SW Contact:    Ralene Bathe, LCSW Phone Number: 07/03/2022, 10:58 AM  Clinical Narrative:                 Transition of Care [facility]) - Inpatient Brief Assessment   Patient Details  Name: John Griffin MRN: [id number] Date of Birth: 02-Aug-1950  Transition of Care [facility]) CM/SW Contact:    Ralene Bathe, LCSW Phone Number: 07/03/2022, 10:58 AM   Transition of Care Asessment: Insurance and Status: Insurance coverage has been reviewed Patient has primary care physician: Yes Home environment has been reviewed: yes Prior level of function:: independent Prior/Current Home Services: No current home services Social Determinants of Health Reivew: SDOH reviewed no interventions necessary Readmission risk has been reviewed: Yes Transition of care needs: no transition of care needs at this time     Barriers to Discharge: Continued Medical Work up   Patient Goals and CMS Choice            Expected Discharge Plan and Services       Living arrangements for the past 2 months: Single Family Home                                      Prior Living Arrangements/Services Living arrangements for the past 2 months: Single Family Home Lives with:: Self                   Activities of Daily Living Home Assistive Devices/Equipment: Shower chair with back, Hand-held shower hose, Grab bars in shower, Cane (specify quad or straight) ADL Screening (condition at time of admission) Patient's cognitive ability adequate to safely complete daily activities?: Yes Is the patient deaf or have difficulty hearing?: Yes Does the patient have difficulty seeing, even when wearing glasses/contacts?: Yes Does the patient have difficulty concentrating, remembering, or making decisions?: No Patient able to express need for  assistance with ADLs?: Yes Does the patient have difficulty dressing or bathing?: No Independently performs ADLs?: Yes (appropriate for developmental age) Does the patient have difficulty walking or climbing stairs?: No Weakness of Legs: None Weakness of Arms/Hands: None  Permission Sought/Granted                  Emotional Assessment              Admission diagnosis:  Gangrene of toe (HCC) [I96] Ischemia of toe [I99.8] Patient Active Problem List   Diagnosis Date Noted   Gangrene of toe (HCC) 07/02/2022   Venous ulcer of right lower extremity with varicose veins (HCC) 07/02/2022   Thoracic aortic atherosclerosis (HCC) 11/27/2021   Elevated liver function tests 09/22/2018   Prediabetes 09/22/2018   CAD (coronary artery disease) 08/19/2016   Dyslipidemia, goal LDL below 70 02/29/2016   NSTEMI (non-ST elevation myocardial infarction) (HCC) 02/03/2016   Acute CHF (HCC) 02/03/2016   OSA (obstructive sleep apnea) 06/27/2015   COPD (chronic obstructive pulmonary disease) (HCC) 03/14/2015   Hypersomnia 03/14/2015   Exertional dyspnea 03/14/2015   Obesity 03/14/2015   Osteoarthritis 03/01/2015   Tobacco use    Essential hypertension    Prostate cancer (HCC) 08/17/2014   PCP:  Barbie Banner, MD Pharmacy:   [id number] - [place], [place] - [address]  [address] [place] [address] Phone: [phone] Fax: [phone]     Social Determinants of Health (SDOH) Social History: SDOH Screenings   Food Insecurity: No Food Insecurity (07/02/2022)  Housing: Low Risk  (07/02/2022)  Transportation Needs: No Transportation Needs (07/02/2022)  Utilities: Not At Risk (07/02/2022)  Tobacco Use: High Risk (10/06/2021)   SDOH Interventions:     Readmission Risk Interventions     No data to display

## 2022-07-03 NOTE — Progress Notes (Signed)
HD#0 Subjective:   Summary: This is a 72 year old male with a past medical history of NSTEMI, CAD, OSA, hypertension who presents to the emergency department concerns of left toe pain.  Patient admitted for further evaluation management of left fifth toe gangrene.  Overnight Events: No acute events overnight  Patient evaluated bedside this morning. Patient states no pain of left foot. States poor sensation of feet.  Patient at bedside with his wife.  Objective:  Vital signs in last 24 hours: Vitals:   07/02/22 1442 07/02/22 1811 07/02/22 2237 07/03/22 0248  BP: 137/72 (!) 148/74 135/74 (!) 140/80  Pulse: 88 100 (!) 106 94  Resp: 18 18 18 20   Temp: 97.6 F (36.4 C) 98.2 F (36.8 C) 98.3 F (36.8 C) 98 F (36.7 C)  TempSrc: Oral Oral Oral Oral  SpO2: 96% 94% 95% 93%  Weight:    (!) 141.7 kg  Height:       Supplemental O2: Room Air SpO2: 93 %   Physical Exam:  Constitutional: Resting in recliner, no acute distress HENT: normocephalic atraumatic Eyes: conjunctiva non-erythematous Cardiovascular: regular rate and rhythm, no m/r/g Pulmonary/Chest: normal work of breathing on room air, lungs clear to auscultation bilaterally Abdominal: soft, non-tender, non-distended Extremities: Left fifth toe with gangrene noted, 2+ pedal pulses, there is some concern for coldness to left toes compared to right toes  Filed Weights   07/02/22 1441 07/03/22 0248  Weight: (!) 142.4 kg (!) 141.7 kg     Intake/Output Summary (Last 24 hours) at 07/03/2022 1402 Last data filed at 07/03/2022 1221 Gross per 24 hour  Intake 840 ml  Output 1310 ml  Net -470 ml   Net IO Since Admission: -470 mL [07/03/22 1402]  Pertinent Labs:    Latest Ref Rng & Units 07/02/2022    9:53 AM 05/08/2017    9:42 AM 02/05/2016    2:24 AM  CBC  WBC 4.0 - 10.5 K/uL 9.1  10.6  14.3   Hemoglobin 13.0 - 17.0 g/dL 16.1  09.6  04.5   Hematocrit 39.0 - 52.0 % 48.2  47.7  44.3   Platelets 150 - 400 K/uL 200  235   226        Latest Ref Rng & Units 07/03/2022    8:57 AM 07/02/2022    9:53 AM 06/04/2021    9:31 AM  CMP  Glucose 70 - 99 mg/dL 409  811    BUN 8 - 23 mg/dL 11  13    Creatinine 9.14 - 1.24 mg/dL 7.82  9.56    Sodium 213 - 145 mmol/L 138  137    Potassium 3.5 - 5.1 mmol/L 3.5  3.9    Chloride 98 - 111 mmol/L 102  102    CO2 22 - 32 mmol/L 24  26    Calcium 8.9 - 10.3 mg/dL 8.9  9.0    Total Protein 6.5 - 8.1 g/dL 6.9  6.6  6.8   Total Bilirubin 0.3 - 1.2 mg/dL 1.1  1.0  0.5   Alkaline Phos 38 - 126 U/L 145  139  142   AST 15 - 41 U/L 44  47  39   ALT 0 - 44 U/L 54  54  44     Imaging: VAS [place] ABI WITH/WO TBI  Result Date: 07/03/2022  LOWER EXTREMITY DOPPLER STUDY Patient Name:  CARMERON GETTINGS  Date of Exam:   07/03/2022 Medical Rec #: [id number]      Accession #:  [id number] Date of Birth: 1950-08-26     Patient Gender: M Patient Age:   21 years Exam [facility] Procedure:      VAS [place] ABI WITH/WO TBI Referring Phys: NISCHAL NARENDRA --------------------------------------------------------------------------------  Indications: Gangrene of 5th digit (left foot) High Risk Factors: Hypertension, hyperlipidemia, current smoker, prior MI,                    coronary artery disease.  Comparison Study: No previous exams Performing Technologist: Hill, Jody RVT, RDMS  Examination Guidelines: A complete evaluation includes at minimum, Doppler waveform signals and systolic blood pressure reading at the level of bilateral brachial, anterior tibial, and posterior tibial arteries, when vessel segments are accessible. Bilateral testing is considered an integral part of a complete examination. Photoelectric Plethysmograph (PPG) waveforms and toe systolic pressure readings are included as required and additional duplex testing as needed. Limited examinations for reoccurring indications may be performed as noted.  ABI Findings: +---------+------------------+-----+---------+--------+ Right     Rt Pressure (mmHg)IndexWaveform Comment  +---------+------------------+-----+---------+--------+ Brachial 131                    triphasic         +---------+------------------+-----+---------+--------+ PTA      143               1.09 triphasic         +---------+------------------+-----+---------+--------+ DP       122               0.93 triphasic         +---------+------------------+-----+---------+--------+ Great Toe172               1.31 Normal            +---------+------------------+-----+---------+--------+ +---------+------------------+-----+---------+-------+ Left     Lt Pressure (mmHg)IndexWaveform Comment +---------+------------------+-----+---------+-------+ Brachial 127                    triphasic        +---------+------------------+-----+---------+-------+ PTA      145               1.11 triphasic        +---------+------------------+-----+---------+-------+ DP       152               1.16 triphasic        +---------+------------------+-----+---------+-------+ Great Toe145               1.11 Normal           +---------+------------------+-----+---------+-------+ +-------+-----------+-----------+------------+------------+ ABI/TBIToday's ABIToday's TBIPrevious ABIPrevious TBI +-------+-----------+-----------+------------+------------+ Right  1.09       1.31                                +-------+-----------+-----------+------------+------------+ Left   1.16       1.11                                +-------+-----------+-----------+------------+------------+  Summary: Right: Resting right ankle-brachial index is within normal range. The right toe-brachial index is elevated, likely representing small vessel calcification. Left: Resting left ankle-brachial index is within normal range. The left toe-brachial index is normal. *See table(s) above for measurements and observations.     Preliminary     Assessment/Plan:    Principal Problem:   Gangrene of toe (HCC) Active Problems:  Venous ulcer of right lower extremity with varicose veins (HCC)   Patient Summary: Zachari Farooqi is a 72 y.o. with a pertinent past medical history of NSTEMI, CAD, OSA, hypertension who presents to the emergency department concerns of left toe pain.  Patient admitted for further evaluation management of left fifth toe gangrene.  #Gangrene of left fifth toe Patient evaluated bedside this morning.  On exam patient has 2+ pedal pulses.  There is some coldness noted to left toes compared to right.  Reviewed orthopedic note, will plan to take patient for amputation on 07/04/2022.  Currently no white count.  Patient remains afebrile.  No concerns for infection at this time.  Will make n.p.o. at midnight.  Will continue to hold aspirin and Plavix.  Will obtain ABIs. -Plan for hypertension on Friday 06/14 -N.p.o. at midnight -Hold home aspirin and Plavix -Continue to monitor for any signs of infection -Plan to consult PT/OT postop -Follow-up ABIs  #History of NSTEMI #CAD #Hyperlipidemia #Heart failure with reduced ejection fraction Patient currently with no concern for heart failure exacerbation.  Patient denies any chest pain.  Not concern for ACS at this time.  Will have to monitor for this as patient is currently off his home aspirin and Plavix.  Will continue with other measures with atorvastatin 80 mg daily as well as metoprolol tartrate 25 mg daily. -Continue to monitor for any signs of ACS during hospitalization -Hold home antiplatelets until postop -Continue atorvastatin 80 mg daily -Continue Lasix 20 mg daily -Continue workup outpatient  #Hypertension Blood pressure slightly elevated this morning into the 130s and 140s.  Resume home antihypertensives. -Resume home losartan 25 mg daily -Resume home Imdur 30 mg daily Continue to monitor blood pressures  #OSA #COPD No acute concern for exacerbation at this time.  Per  pulmonology notes, no need for maintenance therapy at this time. -CPAP at night -Albuterol as needed nebulizer  #Type 2 diabetes mellitus Well-controlled with most recent A1c 6.1 on 06/25/2022. -Sliding scale insulin -Monitor CBGs  #Elevated LFTs Will repeat LFTs this morning.  Unclear etiology, could be nonalcoholic fatty liver disease.  Could also be related to underlying gangrene and could be reactive. -Repeat CMP pending -Continue workup outpatient -Obtain right upper quadrant ultrasound  Diet: Carb-Modified IVF: None,None VTE: Enoxaparin Code: Full PT/OT recs: None, none  Dispo: Anticipated discharge to Home in 3 days pending clinical improvment.   Modena Slater DO Internal Medicine Resident PGY-1 [phone] Please contact the on call pager after 5 pm and on weekends at [phone].

## 2022-07-03 NOTE — Progress Notes (Signed)
ABI exam has been completed.    Results can be found under chart review under CV PROC. 07/03/2022 1:26 PM Lovelee Forner RVT, RDMS

## 2022-07-03 NOTE — Hospital Course (Addendum)
John Griffin is a 72 y.o. with a pertinent past medical history of NSTEMI, CAD, OSA, hypertension who presents to the emergency department concerns of left toe pain.  Patient admitted for further evaluation management of left fifth toe gangrene.   #Gangrene of left fifth toe Patient presented to the emergency department concerns of left toe gangrene.  Patient initially stepped on a Lego a couple weeks ago, and initially was treated with antibiotics.  He noted that it turned black prior to arrival, and decided to present to the emergency room.  Patient was found to have gangrene of left fifth toe.  During hospital stay, patient had amputation.  Patient tolerated well.  Patient worked with PT/OT and progressed well during hospitalization.  Pain was well-controlled.  Initially held dual antiplatelet therapy with aspirin and Plavix in anticipation for surgery.  Resumed on discharge.  Did have ABIs during hospitalization in which showed minor right small vessel disease, but otherwise normal ABIs.    #History of NSTEMI #CAD #Hyperlipidemia #Heart failure with reduced ejection fraction No acute concerns during hospitalization for ACS or heart failure exacerbation.  Initially held aspirin and statin given anticipated surgery.  Postsurgery, hemoglobin remained stable, and patient was discharged with home aspirin and Plavix.  Patient continued atorvastatin and Lasix during hospitalization.    #Hypertension Hospitalization, patient blood pressure measured well.  Patient remained on home losartan and Imdur.  Patient discharged with losartan 25 mg daily and Imdur 30 mg daily.   #OSA #COPD Patient did not have any concerns for acute COPD exacerbation during hospitalization.  Patient remained on CPAP at night.  Patient did not require any breathing treatments.    #Type 2 diabetes mellitus Metformin held on admission.  Resumed at discharge.     #Elevated LFTs Patient found to have elevated LFTs.  Patient did  have right upper quadrant ultrasound during hospitalization, which did not show any concern for cirrhosis. AST and ALT remained stable during hospitalization, continue to work up outpatient.    A.  Left fifth toe gangrene: Ensure surgical site is healing well.  Ensure good surgical site is pain control.  Ensure patient resume his home aspirin and Plavix.  Refills as necessary.  B.  History of NSTEMI: Patient remains on aspirin and Plavix.  Continue to workup outpatient.  No acute concerns during hospitalization for ACS.  C.  Elevated LFTs: On admission, patient elevated LFTs.  Patient had right upper quadrant ultrasound which was unrevealing.  Could have been reactive.  Follow-up CMP outpatient.  Follow-up LFTs outpatient.

## 2022-07-04 ENCOUNTER — Encounter (HOSPITAL_COMMUNITY)
Admission: EM | Disposition: A | Discharge status: Home / Self Care | Payer: Self-pay | Source: Home / Self Care | Attending: Internal Medicine

## 2022-07-04 ENCOUNTER — Encounter (HOSPITAL_COMMUNITY): Payer: Self-pay | Admitting: Internal Medicine

## 2022-07-04 ENCOUNTER — Inpatient Hospital Stay (HOSPITAL_COMMUNITY): Payer: Medicare Other | Admitting: Anesthesiology

## 2022-07-04 ENCOUNTER — Other Ambulatory Visit: Payer: Self-pay

## 2022-07-04 DIAGNOSIS — I509 Heart failure, unspecified: Secondary | ICD-10-CM

## 2022-07-04 DIAGNOSIS — I110 Hypertensive heart disease with heart failure: Secondary | ICD-10-CM

## 2022-07-04 DIAGNOSIS — I2510 Atherosclerotic heart disease of native coronary artery without angina pectoris: Secondary | ICD-10-CM | POA: Diagnosis not present

## 2022-07-04 DIAGNOSIS — E1152 Type 2 diabetes mellitus with diabetic peripheral angiopathy with gangrene: Secondary | ICD-10-CM

## 2022-07-04 DIAGNOSIS — I252 Old myocardial infarction: Secondary | ICD-10-CM | POA: Diagnosis not present

## 2022-07-04 DIAGNOSIS — E785 Hyperlipidemia, unspecified: Secondary | ICD-10-CM | POA: Diagnosis not present

## 2022-07-04 DIAGNOSIS — Z7984 Long term (current) use of oral hypoglycemic drugs: Secondary | ICD-10-CM

## 2022-07-04 DIAGNOSIS — I96 Gangrene, not elsewhere classified: Secondary | ICD-10-CM | POA: Diagnosis not present

## 2022-07-04 DIAGNOSIS — F17210 Nicotine dependence, cigarettes, uncomplicated: Secondary | ICD-10-CM

## 2022-07-04 HISTORY — PX: AMPUTATION: SHX166

## 2022-07-04 LAB — CBC
HCT: 43.9 % (ref 39.0–52.0)
Hemoglobin: 14.6 g/dL (ref 13.0–17.0)
MCH: 31.3 pg (ref 26.0–34.0)
MCHC: 33.3 g/dL (ref 30.0–36.0)
MCV: 94.2 fL (ref 80.0–100.0)
Platelets: 190 10*3/uL (ref 150–400)
RBC: 4.66 MIL/uL (ref 4.22–5.81)
RDW: 13.1 % (ref 11.5–15.5)
WBC: 9.5 10*3/uL (ref 4.0–10.5)
nRBC: 0 % (ref 0.0–0.2)

## 2022-07-04 LAB — BASIC METABOLIC PANEL
Anion gap: 9 (ref 5–15)
BUN: 15 mg/dL (ref 8–23)
CO2: 27 mmol/L (ref 22–32)
Calcium: 8.8 mg/dL — ABNORMAL LOW (ref 8.9–10.3)
Chloride: 103 mmol/L (ref 98–111)
Creatinine, Ser: 0.75 mg/dL (ref 0.61–1.24)
GFR, Estimated: >60 mL/min (ref >60)
Glucose, Bld: 122 mg/dL — ABNORMAL HIGH (ref 70–99)
Potassium: 3.6 mmol/L (ref 3.5–5.1)
Sodium: 139 mmol/L (ref 135–145)

## 2022-07-04 LAB — GLUCOSE, CAPILLARY
Glucose-Capillary: 143 mg/dL — ABNORMAL HIGH (ref 70–99)
Glucose-Capillary: 119 mg/dL — ABNORMAL HIGH (ref 70–99)
Glucose-Capillary: 112 mg/dL — ABNORMAL HIGH (ref 70–99)
Glucose-Capillary: 143 mg/dL — ABNORMAL HIGH (ref 70–99)
Glucose-Capillary: 109 mg/dL — ABNORMAL HIGH (ref 70–99)
Glucose-Capillary: 123 mg/dL — ABNORMAL HIGH (ref 70–99)

## 2022-07-04 SURGERY — AMPUTATION, FOOT, RAY
Anesthesia: Monitor Anesthesia Care | Site: Fifth Toe | Laterality: Left

## 2022-07-04 MED ORDER — HYDROMORPHONE HCL 1 MG/ML IJ SOLN: 0.2500 mg | INTRAMUSCULAR | Status: DC | PRN

## 2022-07-04 MED ORDER — PROPOFOL 10 MG/ML IV BOLUS
INTRAVENOUS | Status: AC
  Filled 2022-07-04: qty 20

## 2022-07-04 MED ORDER — PHENOL 1.4 % MT LIQD: 1.0000 | OROMUCOSAL | Status: DC | PRN

## 2022-07-04 MED ORDER — METOPROLOL TARTRATE 5 MG/5ML IV SOLN: 2.0000 mg | INTRAVENOUS | Status: DC | PRN

## 2022-07-04 MED ORDER — ONDANSETRON HCL 4 MG/2ML IJ SOLN: 4.0000 mg | Freq: Four times a day (QID) | INTRAMUSCULAR | Status: DC | PRN

## 2022-07-04 MED ORDER — JUVEN PO PACK
1.0000 | PACK | Freq: Two times a day (BID) | ORAL | Status: DC
  Administered 2022-07-04 – 2022-07-05 (×3): 1 via ORAL
  Filled 2022-07-04 (×3): qty 1

## 2022-07-04 MED ORDER — GUAIFENESIN-DM 100-10 MG/5ML PO SYRP: 15.0000 mL | ORAL_SOLUTION | ORAL | Status: DC | PRN

## 2022-07-04 MED ORDER — LIDOCAINE 2% (20 MG/ML) 5 ML SYRINGE
INTRAMUSCULAR | Status: AC
  Filled 2022-07-04: qty 5

## 2022-07-04 MED ORDER — ZINC SULFATE 220 (50 ZN) MG PO CAPS
220.0000 mg | ORAL_CAPSULE | Freq: Every day | ORAL | Status: DC
  Administered 2022-07-04 – 2022-07-05 (×2): 220 mg via ORAL
  Filled 2022-07-04 (×2): qty 1

## 2022-07-04 MED ORDER — FENTANYL CITRATE (PF) 100 MCG/2ML IJ SOLN
INTRAMUSCULAR | Status: DC | PRN
  Administered 2022-07-04 (×2): 50 ug via INTRAVENOUS

## 2022-07-04 MED ORDER — SODIUM CHLORIDE 0.9 % IV SOLN: INTRAVENOUS | Status: DC

## 2022-07-04 MED ORDER — PROPOFOL 10 MG/ML IV BOLUS
INTRAVENOUS | Status: DC | PRN
  Administered 2022-07-04: 20 mg via INTRAVENOUS
  Administered 2022-07-04 (×4): 10 mg via INTRAVENOUS
  Administered 2022-07-04: 30 mg via INTRAVENOUS
  Administered 2022-07-04: 10 mg via INTRAVENOUS

## 2022-07-04 MED ORDER — BISACODYL 5 MG PO TBEC: 5.0000 mg | DELAYED_RELEASE_TABLET | Freq: Every day | ORAL | Status: DC | PRN

## 2022-07-04 MED ORDER — VITAMIN C 500 MG PO TABS
1000.0000 mg | ORAL_TABLET | Freq: Every day | ORAL | Status: DC
  Administered 2022-07-04 – 2022-07-05 (×2): 1000 mg via ORAL
  Filled 2022-07-04 (×2): qty 2

## 2022-07-04 MED ORDER — HYDROCODONE-ACETAMINOPHEN 7.5-325 MG PO TABS: 1.0000 | ORAL_TABLET | ORAL | Status: DC | PRN

## 2022-07-04 MED ORDER — LIDOCAINE HCL (PF) 1 % IJ SOLN
INTRAMUSCULAR | Status: DC | PRN
  Administered 2022-07-04: 10 mL

## 2022-07-04 MED ORDER — CEFAZOLIN SODIUM-DEXTROSE 2-4 GM/100ML-% IV SOLN
2.0000 g | Freq: Three times a day (TID) | INTRAVENOUS | Status: AC
  Administered 2022-07-04 (×2): 2 g via INTRAVENOUS
  Filled 2022-07-04 (×2): qty 100

## 2022-07-04 MED ORDER — MORPHINE SULFATE (PF) 2 MG/ML IV SOLN: 0.5000 mg | INTRAVENOUS | Status: DC | PRN

## 2022-07-04 MED ORDER — ACETAMINOPHEN 325 MG PO TABS: 325.0000 mg | ORAL_TABLET | Freq: Four times a day (QID) | ORAL | Status: DC | PRN

## 2022-07-04 MED ORDER — MAGNESIUM SULFATE 2 GM/50ML IV SOLN: 2.0000 g | Freq: Every day | INTRAVENOUS | Status: DC | PRN

## 2022-07-04 MED ORDER — FENTANYL CITRATE (PF) 100 MCG/2ML IJ SOLN
INTRAMUSCULAR | Status: AC
  Filled 2022-07-04: qty 2

## 2022-07-04 MED ORDER — 0.9 % SODIUM CHLORIDE (POUR BTL) OPTIME
TOPICAL | Status: DC | PRN
  Administered 2022-07-04: 1000 mL

## 2022-07-04 MED ORDER — HYDRALAZINE HCL 20 MG/ML IJ SOLN: 5.0000 mg | INTRAMUSCULAR | Status: DC | PRN

## 2022-07-04 MED ORDER — HYDROCODONE-ACETAMINOPHEN 10-325 MG PO TABS: 1.0000 | ORAL_TABLET | ORAL | Status: DC | PRN

## 2022-07-04 MED ORDER — LABETALOL HCL 5 MG/ML IV SOLN: 10.0000 mg | INTRAVENOUS | Status: DC | PRN

## 2022-07-04 MED ORDER — ALUM & MAG HYDROXIDE-SIMETH 200-200-20 MG/5ML PO SUSP: 15.0000 mL | ORAL | Status: DC | PRN

## 2022-07-04 MED ORDER — POLYETHYLENE GLYCOL 3350 17 G PO PACK: 17.0000 g | PACK | Freq: Every day | ORAL | Status: DC | PRN

## 2022-07-04 MED ORDER — HYDROCODONE-ACETAMINOPHEN 5-325 MG PO TABS: 1.0000 | ORAL_TABLET | ORAL | Status: DC | PRN

## 2022-07-04 MED ORDER — POTASSIUM CHLORIDE CRYS ER 20 MEQ PO TBCR: 20.0000 meq | EXTENDED_RELEASE_TABLET | Freq: Every day | ORAL | Status: DC | PRN

## 2022-07-04 MED ORDER — LACTATED RINGERS IV SOLN: INTRAVENOUS | Status: DC | PRN

## 2022-07-04 MED ORDER — LIDOCAINE HCL (PF) 1 % IJ SOLN
INTRAMUSCULAR | Status: AC
  Filled 2022-07-04: qty 30

## 2022-07-04 MED ORDER — PANTOPRAZOLE SODIUM 40 MG PO TBEC
40.0000 mg | DELAYED_RELEASE_TABLET | Freq: Every day | ORAL | Status: DC
  Administered 2022-07-04 – 2022-07-05 (×2): 40 mg via ORAL
  Filled 2022-07-04 (×2): qty 1

## 2022-07-04 MED ORDER — LIDOCAINE HCL (CARDIAC) PF 100 MG/5ML IV SOSY
PREFILLED_SYRINGE | INTRAVENOUS | Status: DC | PRN
  Administered 2022-07-04: 40 mg via INTRAVENOUS

## 2022-07-04 MED ORDER — ONDANSETRON HCL 4 MG/2ML IJ SOLN
INTRAMUSCULAR | Status: DC | PRN
  Administered 2022-07-04: 4 mg via INTRAVENOUS

## 2022-07-04 MED ORDER — MAGNESIUM CITRATE PO SOLN: 1.0000 | Freq: Once | ORAL | Status: DC | PRN

## 2022-07-04 MED ORDER — DOCUSATE SODIUM 100 MG PO CAPS
100.0000 mg | ORAL_CAPSULE | Freq: Every day | ORAL | Status: DC
  Filled 2022-07-04 (×2): qty 1

## 2022-07-04 SURGICAL SUPPLY — 36 items
BAG COUNTER SPONGE SURGICOUNT (BAG) ×1 IMPLANT
BAG SPNG CNTER NS LX DISP (BAG) ×1
BLADE SAW SGTL MED 73X18.5 STR (BLADE) IMPLANT
BLADE SURG 21 STRL SS (BLADE) ×1 IMPLANT
BNDG COHESIVE 4X5 TAN STRL (GAUZE/BANDAGES/DRESSINGS) ×1 IMPLANT
BNDG GAUZE DERMACEA FLUFF 4 (GAUZE/BANDAGES/DRESSINGS) ×1 IMPLANT
BNDG GZE DERMACEA 4 6PLY (GAUZE/BANDAGES/DRESSINGS)
CANISTER PREVENA PLUS 150 (CANNISTER) IMPLANT
COVER SURGICAL LIGHT HANDLE (MISCELLANEOUS) ×2 IMPLANT
DRAPE DERMATAC (DRAPES) IMPLANT
DRAPE U-SHAPE 47X51 STRL (DRAPES) ×2 IMPLANT
DRESSING PEEL AND PLC PRVNA 13 (GAUZE/BANDAGES/DRESSINGS) IMPLANT
DRSG ADAPTIC 3X8 NADH LF (GAUZE/BANDAGES/DRESSINGS) ×1 IMPLANT
DRSG PEEL AND PLACE PREVENA 13 (GAUZE/BANDAGES/DRESSINGS) ×1
DURAPREP 26ML APPLICATOR (WOUND CARE) ×1 IMPLANT
ELECT REM PT RETURN 9FT ADLT (ELECTROSURGICAL) ×1
ELECTRODE REM PT RTRN 9FT ADLT (ELECTROSURGICAL) ×1 IMPLANT
GAUZE PAD ABD 8X10 STRL (GAUZE/BANDAGES/DRESSINGS) ×2 IMPLANT
GAUZE SPONGE 4X4 12PLY STRL (GAUZE/BANDAGES/DRESSINGS) ×1 IMPLANT
GLOVE BIOGEL PI IND STRL 9 (GLOVE) ×1 IMPLANT
GLOVE SURG ORTHO 9.0 STRL STRW (GLOVE) ×1 IMPLANT
GOWN STRL REUS W/ TWL XL LVL3 (GOWN DISPOSABLE) ×2 IMPLANT
GOWN STRL REUS W/TWL XL LVL3 (GOWN DISPOSABLE) ×2
KIT BASIN OR (CUSTOM PROCEDURE TRAY) ×1 IMPLANT
KIT TURNOVER KIT B (KITS) ×1 IMPLANT
NDL HYPO 21X1.5 SAFETY (NEEDLE) IMPLANT
NEEDLE HYPO 21X1.5 SAFETY (NEEDLE) ×1 IMPLANT
NS IRRIG 1000ML POUR BTL (IV SOLUTION) ×1 IMPLANT
PACK ORTHO EXTREMITY (CUSTOM PROCEDURE TRAY) ×1 IMPLANT
PAD ARMBOARD 7.5X6 YLW CONV (MISCELLANEOUS) ×2 IMPLANT
STOCKINETTE IMPERVIOUS LG (DRAPES) IMPLANT
SUT ETHILON 2 0 PSLX (SUTURE) ×1 IMPLANT
SYR CONTROL 10ML LL (SYRINGE) IMPLANT
TOWEL GREEN STERILE (TOWEL DISPOSABLE) ×1 IMPLANT
TUBE CONNECTING 12X1/4 (SUCTIONS) ×1 IMPLANT
YANKAUER SUCT BULB TIP NO VENT (SUCTIONS) ×1 IMPLANT

## 2022-07-04 NOTE — Evaluation (Addendum)
Physical Therapy Evaluation Patient Details Name: John Griffin MRN: [id number] DOB: 07/24/1950 Today's Date: 07/04/2022  History of Present Illness  72 yo male admitted 6/12 with left toe gangrene s/p Lt 5th ray amp 6/14. PMhx: CAD, OSA, HTN, DM  Clinical Impression  Pt pleasant and very willing to mobilize stating no pain. Pt reports initial wound was from neuropathy and stepping on childs building block. Pt educated for need to wear post op shoe LLE as well as Rt shoe for protection and height assist and need to keep weight on lt heel as much as possible and limited mobility at home. Pt educated for [facility] parts and use with carrying strap attached. Pt with decreased transfers, gait and activity tolerance who will benefit from acute therapy to maximize independence. Encouraged mobility over the weekend if remains inpt.  Pt denied need for [facility] at home and reports he can use urinal to limited trips walking to the bathroom. Pt unable to perform any NWB activity and heavily reliant on putting weight through left heel. Will follow acutely to maximize safety and stability. Pt declined attempting stairs this session but anticipate he will require full weight bearing on LLE to ascend steps.     Recommendations for follow up therapy are one component of a multi-disciplinary discharge planning process, led by the attending physician.  Recommendations may be updated based on patient status, additional functional criteria and insurance authorization.  Follow Up Recommendations       Assistance Recommended at Discharge PRN  Patient can return home with the following  Assistance with cooking/housework;Help with stairs or ramp for entrance    Equipment Recommendations Rolling walker (2 wheels)  Recommendations for Other Services       Functional Status Assessment Patient has had a recent decline in their functional status and demonstrates the ability to make significant improvements in function in a  reasonable and predictable amount of time.     Precautions / Restrictions Precautions Precautions: Fall;Other (comment) Precaution Comments: VAC Restrictions Weight Bearing Restrictions: Yes Other Position/Activity Restrictions: heel weight bearing      Mobility  Bed Mobility Overal bed mobility: Modified Independent                  Transfers Overall transfer level: Modified independent                      Ambulation/Gait Ambulation/Gait assistance: Min guard Gait Distance (Feet): 100 Feet Assistive device: Rolling walker (2 wheels) Gait Pattern/deviations: Step-through pattern, Decreased stride length   Gait velocity interpretation: 1.31 - 2.62 ft/sec, indicative of limited community ambulator   General Gait Details: pt with RLE shorter than LLE with fused ankle on Rt. Pt walking with unsteady gait and foot drop on right with reliance on RW to off weight LLE and for balance. Pt did not have Rt shoe present to attempt to assist with height discrepancy. Cues for safety, position in RW and weight on left heel  Stairs Stairs:  (pt denied needing to attempt acutely)          Wheelchair Mobility    Modified Rankin (Stroke Patients Only)       Balance Overall balance assessment: Needs assistance Sitting-balance support: No upper extremity supported, Feet supported Sitting balance-Leahy Scale: Fair     Standing balance support: Bilateral upper extremity supported Standing balance-Leahy Scale: Poor Standing balance comment: RW for gait  Pertinent Vitals/Pain Pain Assessment Pain Assessment: No/denies pain    Home Living Family/patient expects to be discharged to:: Private residence Living Arrangements: Spouse/significant other Available Help at Discharge: Family;Available PRN/intermittently Type of Home: House Home Access: Stairs to enter Entrance Stairs-Rails: Right;Left;Can reach both Entrance  Stairs-Number of Steps: 6   Home Layout: One level Home Equipment: Shower seat;Hand held shower head;Cane - single point      Prior Function Prior Level of Function : Independent/Modified Independent;Driving                     Hand Dominance        Extremity/Trunk Assessment   Upper Extremity Assessment Upper Extremity Assessment: Overall WFL for tasks assessed    Lower Extremity Assessment Lower Extremity Assessment: Generalized weakness    Cervical / Trunk Assessment Cervical / Trunk Assessment: Kyphotic  Communication   Communication: HOH  Cognition Arousal/Alertness: Awake/alert Behavior During Therapy: WFL for tasks assessed/performed Overall Cognitive Status: Within Functional Limits for tasks assessed                                          General Comments      Exercises     Assessment/Plan    PT Assessment Patient needs continued PT services  PT Problem List Decreased activity tolerance;Decreased balance;Decreased mobility;Decreased knowledge of use of DME       PT Treatment Interventions Gait training;Functional mobility training;Therapeutic activities;Patient/family education;Stair training;DME instruction    PT Goals (Current goals can be found in the Care Plan section)  Acute Rehab PT Goals Patient Stated Goal: return home PT Goal Formulation: With patient/family Time For Goal Achievement: 07/11/22 Potential to Achieve Goals: Good    Frequency Min 3X/week     Co-evaluation               AM-PAC PT "6 Clicks" Mobility  Outcome Measure Help needed turning from your back to your side while in a flat bed without using bedrails?: None Help needed moving from lying on your back to sitting on the side of a flat bed without using bedrails?: None Help needed moving to and from a bed to a chair (including a wheelchair)?: None Help needed standing up from a chair using your arms (e.g., wheelchair or bedside chair)?:  None Help needed to walk in hospital room?: A Little Help needed climbing 3-5 steps with a railing? : A Little 6 Click Score: 22    End of Session   Activity Tolerance: Patient tolerated treatment well Patient left: in chair;with call bell/phone within reach;with family/visitor present Nurse Communication: Mobility status PT Visit Diagnosis: Other abnormalities of gait and mobility (R26.89)    Time: 1610-9604 PT Time Calculation (min) (ACUTE ONLY): 24 min   Charges:   PT Evaluation $PT Eval Moderate Complexity: 1 Mod          Altamese Deguire P, PT Acute Rehabilitation Services Office: [phone]   Enedina Finner Caira Poche 07/04/2022, 12:42 PM

## 2022-07-04 NOTE — Progress Notes (Signed)
This RN tried Engineer, drilling for procedure twice. RN will try again in a few minutes.   Devonne Doughty Lorana Maffeo

## 2022-07-04 NOTE — Anesthesia Postprocedure Evaluation (Signed)
Anesthesia Post Note  Patient: John Griffin  Procedure(s) Performed: LEFT FOOT 5TH RAY AMPUTATION (Left: Fifth Toe)     Patient location during evaluation: PACU Anesthesia Type: MAC Level of consciousness: awake and alert Pain management: pain level controlled Vital Signs Assessment: post-procedure vital signs reviewed and stable Respiratory status: spontaneous breathing, nonlabored ventilation and respiratory function stable Cardiovascular status: stable and blood pressure returned to baseline Postop Assessment: no apparent nausea or vomiting Anesthetic complications: no  No notable events documented.  Last Vitals:  Vitals:   07/04/22 0832 07/04/22 0900  BP:  126/71  Pulse: 78 79  Resp: (!) 24 16  Temp:  36.5 C  SpO2: 94% 94%    Last Pain:  Vitals:   07/04/22 0900  TempSrc: Oral  PainSc:                  Poseidon Pam,W. EDMOND

## 2022-07-04 NOTE — Progress Notes (Signed)
Inpatient Rehab Admissions Coordinator:  Consult received. Await therapy evaluations to help determine appropriate rehab venue.   Bartow Zylstra Graves Madden, MS, CCC-SLP Admissions Coordinator [phone]  

## 2022-07-04 NOTE — Progress Notes (Signed)
Orthopedic Tech Progress Note Patient Details:  John Griffin November 21, 1950 [id number]  Patient ID: Angela Nevin, male   DOB: 17-May-1950, 72 y.o.   MRN: [id number] Reached out to RN via secure chat in regards to patients shoe size, awaiting response. Darleen Crocker 07/04/2022, 9:59 AM

## 2022-07-04 NOTE — Progress Notes (Signed)
Inpatient Rehab Admissions Coordinator:  Consult received. PT recommending no follow. Will sign off.   Wolfgang Phoenix, MS, CCC-SLP Admissions Coordinator [phone]

## 2022-07-04 NOTE — Op Note (Signed)
07/02/2022 - 07/04/2022  7:55 AM  PATIENT:  John Griffin    PRE-OPERATIVE DIAGNOSIS:  Gangrene Left Little Toe  POST-OPERATIVE DIAGNOSIS:  Same  PROCEDURE:  LEFT FOOT 5TH RAY AMPUTATION Local tissue rearrangement for wound closure 4 x 10 cm. Application of Prevena 13 cm wound VAC.  SURGEON:  Nadara Mustard, MD  PHYSICIAN ASSISTANT:None ANESTHESIA:   General  PREOPERATIVE INDICATIONS:  John Griffin is a  72 y.o. male with a diagnosis of Gangrene Left Little Toe who failed conservative measures and elected for surgical management.    The risks benefits and alternatives were discussed with the patient preoperatively including but not limited to the risks of infection, bleeding, nerve injury, cardiopulmonary complications, the need for revision surgery, among others, and the patient was willing to proceed.  OPERATIVE IMPLANTS:   * No implants in log *  @ENCIMAGES @  OPERATIVE FINDINGS: Patient had a strong dorsalis pedis pulse and good petechial bleeding but did have ischemic changes to the fourth toe.  OPERATIVE PROCEDURE: Patient brought the operating room and underwent a MAC anesthetic.  The left lower extremity was then prepped using DuraPrep draped into a sterile field a timeout was called.  Patient's surgical area was locally infiltrated with 10 cc of 1% lidocaine plain.  After adequate levels anesthesia were obtained a racquet incision was made around the ulcerative tissue in the gangrenous toe.  This left a wound that was 4 x 10 cm.  The edges were undermined to allow for local tissue rearrangement.  There was good petechial bleeding electrocautery was used hemostasis.  The wound was irrigated with normal saline.  Local tissue rearrangement was used to close the wound 10 x 4 cm with 2-0 nylon.  The wound closed well without tension on the skin.  A Prevena 13 cm wound VAC was applied this had a good suction fit patient was taken the PACU in stable condition.   DISCHARGE  PLANNING:  Antibiotic duration: Continue antibiotics for 24 hours postoperatively.  Weightbearing: Ideally patient should be nonweightbearing on the left lower extremity however patient may be weightbearing on the left heel as needed for gait training.  Pain medication: Low-dose opioid pathway  Dressing care/ Wound VAC: Wound VAC continue for 1 week.  Ambulatory devices: Walker or crutches  Discharge to: Anticipate discharge to home.  Follow-up: In the office 1 week post operative.

## 2022-07-04 NOTE — Progress Notes (Addendum)
HD#1 Subjective:   Summary: This is a 72 year old male with a past medical history of NSTEMI, CAD, OSA, hypertension who presents to the emergency department concerns of left toe pain.  Patient admitted for further evaluation management of left fifth toe gangrene.  Overnight Events: No acute events overnight  Patient evaluated bedside this morning. He had surgery this AM and has no pain. He is just hungry.  He has no other complaints today.  Objective:  Vital signs in last 24 hours: Vitals:   07/03/22 0248 07/03/22 1838 07/03/22 2124 07/04/22 0456  BP: (!) 140/80 129/69 126/86 127/75  Pulse: 94 (!) 115 94 71  Resp: 20 18 18 20   Temp: 98 F (36.7 C) 99.6 F (37.6 C) 97.9 F (36.6 C) 98.4 F (36.9 C)  TempSrc: Oral Oral Oral Oral  SpO2: 93% 93% 94% 94%  Weight: (!) 141.7 kg     Height:       Supplemental O2: Room Air SpO2: 94 %   Physical Exam:  Constitutional: Resting in recliner, no acute distress HENT: normocephalic atraumatic Eyes: conjunctiva non-erythematous Cardiovascular: regular rate and rhythm, no m/r/g Pulmonary/Chest: normal work of breathing on room air, lungs clear to auscultation bilaterally Abdominal: soft, non-tender, non-distended Extremities: Wound VAC noted to left foot postop, minimal bleeding appreciated  Filed Weights   07/02/22 1441 07/03/22 0248  Weight: (!) 142.4 kg (!) 141.7 kg     Intake/Output Summary (Last 24 hours) at 07/04/2022 0631 Last data filed at 07/04/2022 0500 Gross per 24 hour  Intake 580 ml  Output 280 ml  Net 300 ml    Net IO Since Admission: -470 mL [07/04/22 0631]  Pertinent Labs:    Latest Ref Rng & Units 07/04/2022    2:40 AM 07/02/2022    9:53 AM 05/08/2017    9:42 AM  CBC  WBC 4.0 - 10.5 K/uL 9.5  9.1  10.6   Hemoglobin 13.0 - 17.0 g/dL 16.1  09.6  04.5   Hematocrit 39.0 - 52.0 % 43.9  48.2  47.7   Platelets 150 - 400 K/uL 190  200  235        Latest Ref Rng & Units 07/04/2022    2:40 AM 07/03/2022     8:57 AM 07/02/2022    9:53 AM  CMP  Glucose 70 - 99 mg/dL 409  811  914   BUN 8 - 23 mg/dL 15  11  13    Creatinine 0.61 - 1.24 mg/dL 7.82  9.56  2.13   Sodium 135 - 145 mmol/L 139  138  137   Potassium 3.5 - 5.1 mmol/L 3.6  3.5  3.9   Chloride 98 - 111 mmol/L 103  102  102   CO2 22 - 32 mmol/L 27  24  26    Calcium 8.9 - 10.3 mg/dL 8.8  8.9  9.0   Total Protein 6.5 - 8.1 g/dL  6.9  6.6   Total Bilirubin 0.3 - 1.2 mg/dL  1.1  1.0   Alkaline Phos 38 - 126 U/L  145  139   AST 15 - 41 U/L  44  47   ALT 0 - 44 U/L  54  54     Imaging: US Abdomen Limited RUQ (LIVER/GB)  Result Date: 07/03/2022 CLINICAL DATA:  Elevated LFTs EXAM: ULTRASOUND ABDOMEN LIMITED RIGHT UPPER QUADRANT COMPARISON:  None Available. FINDINGS: Gallbladder: No gallstones or wall thickening visualized. No sonographic Murphy sign noted by sonographer. Common bile duct: Diameter: 4.6  mm. Liver: No focal lesion identified. Within normal limits in parenchymal echogenicity. Portal vein is patent on color Doppler imaging with normal direction of blood flow towards the liver. Other: None. IMPRESSION: No acute abnormality noted. Electronically Signed   By: Alcide Clever M.D.   On: 07/03/2022 23:30   VAS [place] ABI WITH/WO TBI  Result Date: 07/03/2022  LOWER EXTREMITY DOPPLER STUDY Patient Name:  John Griffin  Date of Exam:   07/03/2022 Medical Rec #: [id number]      Accession #:    [id number] Date of Birth: 1950/12/20     Patient Gender: M Patient Age:   65 years Exam Location:  [facility] Procedure:      VAS [place] ABI WITH/WO TBI Referring Phys: NISCHAL NARENDRA --------------------------------------------------------------------------------  Indications: Gangrene of 5th digit (left foot) High Risk Factors: Hypertension, hyperlipidemia, current smoker, prior MI,                    coronary artery disease.  Comparison Study: No previous exams Performing Technologist: Hill, Jody RVT, RDMS  Examination Guidelines: A complete evaluation  includes at minimum, Doppler waveform signals and systolic blood pressure reading at the level of bilateral brachial, anterior tibial, and posterior tibial arteries, when vessel segments are accessible. Bilateral testing is considered an integral part of a complete examination. Photoelectric Plethysmograph (PPG) waveforms and toe systolic pressure readings are included as required and additional duplex testing as needed. Limited examinations for reoccurring indications may be performed as noted.  ABI Findings: +---------+------------------+-----+---------+--------+ Right    Rt Pressure (mmHg)IndexWaveform Comment  +---------+------------------+-----+---------+--------+ Brachial 131                    triphasic         +---------+------------------+-----+---------+--------+ PTA      143               1.09 triphasic         +---------+------------------+-----+---------+--------+ DP       122               0.93 triphasic         +---------+------------------+-----+---------+--------+ Great Toe172               1.31 Normal            +---------+------------------+-----+---------+--------+ +---------+------------------+-----+---------+-------+ Left     Lt Pressure (mmHg)IndexWaveform Comment +---------+------------------+-----+---------+-------+ Brachial 127                    triphasic        +---------+------------------+-----+---------+-------+ PTA      145               1.11 triphasic        +---------+------------------+-----+---------+-------+ DP       152               1.16 triphasic        +---------+------------------+-----+---------+-------+ Great Toe145               1.11 Normal           +---------+------------------+-----+---------+-------+ +-------+-----------+-----------+------------+------------+ ABI/TBIToday's ABIToday's TBIPrevious ABIPrevious TBI +-------+-----------+-----------+------------+------------+ Right  1.09       1.31                                 +-------+-----------+-----------+------------+------------+ Left   1.16       1.11                                +-------+-----------+-----------+------------+------------+  Summary: Right: Resting right ankle-brachial index is within normal range. The right toe-brachial index is elevated, likely representing small vessel calcification. Left: Resting left ankle-brachial index is within normal range. The left toe-brachial index is normal. *See table(s) above for measurements and observations.  Electronically signed by Sherald Hess MD on 07/03/2022 at 3:52:11 PM.    Final     Assessment/Plan:   Principal Problem:   Gangrene of toe (HCC) Active Problems:   Venous ulcer of right lower extremity with varicose veins (HCC)   Patient Summary: John Griffin is a 72 y.o. with a pertinent past medical history of NSTEMI, CAD, OSA, hypertension who presents to the emergency department concerns of left toe pain.  Patient admitted for further evaluation management of left fifth toe gangrene.  #Gangrene of left fifth toe Patient evaluated postop this morning.  Patient states that surgery went well.  He denies any pain.  On exam there is a wound VAC noted to left foot.  Will have patient get up and move with PT/OT.  Will assist with pain control if needed.  Will resume diet as well.  ABIs did not show any significant evidence of PAD.  I anticipate wound healing will be good. -Patient postop day 1 from left fifth toe amputation -Will resume aspirin and Plavix tomorrow -PT OT following -Continue to monitor for any signs of postop infections -Resume diet  #History of NSTEMI #CAD #Hyperlipidemia #Heart failure with reduced ejection fraction Currently no concerns of heart failure exacerbation.  No concerns of ACS at this time. -Will resume antiplatelets tomorrow -Continue metoprolol tartrate 25 mg daily -Continue with losartan 25 mg daily -Continue atorvastatin 80 mg  daily -Continue Lasix 20 mg daily  -Will need to continue outpatient workup for GDMT, as well as seeing the patient is to be on DAPT.    #Hypertension Blood pressure this morning well. -Continue losartan 25 mg daily -Continue Imdur 30 mg daily -Continue to monitor blood pressures   #OSA #COPD No acute concern for exacerbation at this time. Per pulmonology notes, no need for maintenance therapy at this time. -CPAP at night -Albuterol as needed nebulizer  #Type 2 diabetes mellitus Fasting glucose this morning 122.  Will ensure proper glucose control with goal less than 180 for proper wound healing.  Well-controlled with most recent A1c 6.1 on 06/25/2022. -Sliding scale insulin -Monitor CBGs -Goal glucose less than 190  #Elevated LFTs Given elevated AST and ALT, did obtain right upper quadrant ultrasound, which did not reveal much.  This could be NASH.  Could also be reactive to underlying gangrene.  Will continue to monitor. -Continue workup outpatient  Diet: Carb-Modified IVF: None,None VTE: Enoxaparin Code: Full PT/OT recs: None, none  Dispo: Anticipated discharge to Home in 2 days pending clinical improvment.   Modena Slater DO Internal Medicine Resident PGY-1 [phone] Please contact the on call pager after 5 pm and on weekends at [phone].

## 2022-07-04 NOTE — Interval H&P Note (Signed)
History and Physical Interval Note:  07/04/2022 6:34 AM  John Griffin  has presented today for surgery, with the diagnosis of Gangrene Left Little Toe.  The various methods of treatment have been discussed with the patient and family. After consideration of risks, benefits and other options for treatment, the patient has consented to  Procedure(s): LEFT FOOT 5TH RAY AMPUTATION (Left) as a surgical intervention.  The patient's history has been reviewed, patient examined, no change in status, stable for surgery.  I have reviewed the patient's chart and labs.  Questions were answered to the patient's satisfaction.     Nadara Mustard

## 2022-07-04 NOTE — Progress Notes (Signed)
Orthopedic Tech Progress Note Patient Details:  John Griffin [birth year] [id number]  Ortho Devices Type of Ortho Device: Postop shoe/boot Ortho Device/Splint Location: LLE Ortho Device/Splint Interventions: Ordered, Adjustment, Application   Post Interventions Patient Tolerated: Well Instructions Provided: Care of device, Adjustment of device  [name] 07/04/2022, 10:33 AM

## 2022-07-04 NOTE — Transfer of Care (Signed)
Immediate Anesthesia Transfer of Care Note  Patient: John Griffin  Procedure(s) Performed: LEFT FOOT 5TH RAY AMPUTATION (Left: Fifth Toe)  Patient Location: PACU  Anesthesia Type:MAC  Level of Consciousness: awake, alert , and oriented  Airway & Oxygen Therapy: Patient Spontanous Breathing  Post-op Assessment: Report given to RN and Post -op Vital signs reviewed and stable  Post vital signs: Reviewed and stable  Last Vitals:  Vitals Value Taken Time  BP 95/78 07/04/22 0803  Temp    Pulse 81 07/04/22 0806  Resp 24 07/04/22 0806  SpO2 92 % 07/04/22 0806  Vitals shown include unvalidated device data.  Last Pain:  Vitals:   07/04/22 0727  TempSrc: Oral  PainSc: 0-No pain      Patients Stated Pain Goal: 0 (07/04/22 0727)  Complications: No notable events documented.

## 2022-07-05 ENCOUNTER — Other Ambulatory Visit (HOSPITAL_COMMUNITY): Payer: Self-pay

## 2022-07-05 ENCOUNTER — Encounter (HOSPITAL_COMMUNITY): Payer: Self-pay | Admitting: Orthopedic Surgery

## 2022-07-05 LAB — CBC
HCT: 42.2 % (ref 39.0–52.0)
Hemoglobin: 14 g/dL (ref 13.0–17.0)
MCH: 31.5 pg (ref 26.0–34.0)
MCHC: 33.2 g/dL (ref 30.0–36.0)
MCV: 94.8 fL (ref 80.0–100.0)
Platelets: 180 10*3/uL (ref 150–400)
RBC: 4.45 MIL/uL (ref 4.22–5.81)
RDW: 13 % (ref 11.5–15.5)
WBC: 11.4 10*3/uL — ABNORMAL HIGH (ref 4.0–10.5)
nRBC: 0 % (ref 0.0–0.2)

## 2022-07-05 LAB — GLUCOSE, CAPILLARY
Glucose-Capillary: 124 mg/dL — ABNORMAL HIGH (ref 70–99)
Glucose-Capillary: 137 mg/dL — ABNORMAL HIGH (ref 70–99)

## 2022-07-05 MED ORDER — MUPIROCIN 2 % EX OINT
1.0000 | TOPICAL_OINTMENT | Freq: Three times a day (TID) | CUTANEOUS | 0 refills | Status: DC
  Filled 2022-07-05: qty 30, 30d supply, fill #0

## 2022-07-05 NOTE — Progress Notes (Signed)
   07/05/22 0200  BiPAP/CPAP/SIPAP  $ Non-Invasive Home Ventilator  Initial  BiPAP/CPAP/SIPAP Pt Type Adult  BiPAP/CPAP/SIPAP Resmed  Mask Type Full face mask  Mask Size Large  Patient Home Equipment Yes  Safety Check Completed by RT for Home Unit Yes, no issues noted   Pt placed himself on his home unit and is sleeping comfortably with no distress

## 2022-07-05 NOTE — Progress Notes (Signed)
Patient ID: John Griffin, male   DOB: 28-Mar-1950, 72 y.o.   MRN: [id number] Patient is postoperative day 1 left foot fifth ray amputation.  I resealed the VAC dressing this has a good suction fit.  Patient may discharge to home at this time with the portable Prevena VAC.  I will see him in the office this week.

## 2022-07-05 NOTE — TOC Transition Note (Signed)
Transition of Care [facility]) - CM/SW Discharge Note   Patient Details  Name: John Griffin MRN: [id number] Date of Birth: [birth year]  Transition of Care [facility]) CM/SW Contact:  Tom-Johnson, Hershal Coria, RN Phone Number: 07/05/2022, 11:12 AM   Clinical Narrative:     Patient is scheduled for discharge today.  Readmission Risk Assessment done. Hospital f/u and discharge instructions on AVS. RW ordered from [employer] and [facility] to deliver to patient at bedside.  Wife, Olegario Messier to transport at discharge.  No further TOC needs noted.       Final next level of care: Home/Self Care Barriers to Discharge: Barriers Resolved   Patient Goals and CMS Choice CMS Medicare.gov Compare Post Acute Care list provided to:: Patient Choice offered to / list presented to : Patient  Discharge Placement                  Patient to be transferred to facility by: Wife Name of family member notified: [facility]    Discharge Plan and Services Additional resources added to the After Visit Summary for                  DME Arranged: Walker rolling DME Agency: [employer] Date DME Agency Contacted: 07/05/22 Time DME Agency Contacted: [phone] Representative spoke with at DME Agency: [facility] HH Arranged: NA HH Agency: NA        Social Determinants of Health (SDOH) Interventions SDOH Screenings   Food Insecurity: No Food Insecurity (07/02/2022)  Housing: Low Risk  (07/02/2022)  Transportation Needs: No Transportation Needs (07/02/2022)  Utilities: Not At Risk (07/02/2022)  Tobacco Use: High Risk (07/05/2022)     Readmission Risk Interventions    07/05/2022   10:21 AM  Readmission Risk Prevention Plan  Transportation Screening Complete  PCP or Specialist Appt within 5-7 Days Complete  Home Care Screening Complete  Medication Review (RN CM) Referral to Pharmacy

## 2022-07-05 NOTE — Discharge Summary (Signed)
Name: John Griffin MRN: [id number] DOB: 07-Jul-1950 72 y.o. PCP: Barbie Banner, MD  Date of Admission: 07/02/2022  8:59 AM Date of Discharge: 07/05/2022 Attending Physician: Dr. Cleda Daub  Discharge Diagnosis: Principal Problem:   Gangrene of toe (HCC) Active Problems:   Venous ulcer of right lower extremity with varicose veins (HCC)    Discharge Medications: Allergies as of 07/05/2022       Reactions   Lisinopril Cough        Medication List     TAKE these medications    acetaminophen 500 MG tablet Commonly known as: TYLENOL Take 1,000 mg by mouth every 6 (six) hours as needed for mild pain. Reported on 05/09/2015   albuterol 108 (90 Base) MCG/ACT inhaler Commonly known as: VENTOLIN HFA Inhale 2 puffs into the lungs every 4 (four) hours as needed for wheezing or shortness of breath.   aspirin 81 MG chewable tablet Chew 1 tablet (81 mg total) by mouth daily.   atorvastatin 80 MG tablet Commonly known as: LIPITOR TAKE 1 TABLET BY MOUTH EVERY DAY   cetirizine 10 MG tablet Commonly known as: ZYRTEC Take 10 mg by mouth daily.   clopidogrel 75 MG tablet Commonly known as: PLAVIX TAKE 1 TABLET BY MOUTH EVERY DAY   CORICIDIN HBP COUGH/COLD PO Take 1 tablet by mouth in the morning and at bedtime. Sinus congestion.   fluticasone 50 MCG/ACT nasal spray Commonly known as: FLONASE Place 2 sprays into both nostrils daily as needed (nasal allergies).   furosemide 20 MG tablet Commonly known as: LASIX TAKE 1 TABLET BY MOUTH EVERY DAY   isosorbide mononitrate 30 MG 24 hr tablet Commonly known as: IMDUR TAKE 1 TABLET BY MOUTH EVERY DAY   Klor-Con M10 10 MEQ tablet Generic drug: potassium chloride TAKE 1 TABLET BY MOUTH EVERY DAY   losartan 25 MG tablet Commonly known as: COZAAR Take 1 tablet (25 mg total) by mouth daily.   metFORMIN 500 MG tablet Commonly known as: GLUCOPHAGE Take 500 mg by mouth in the morning and at bedtime.   metoprolol tartrate 25  MG tablet Commonly known as: LOPRESSOR Take 1 tablet (25 mg total) by mouth 2 (two) times daily.   mupirocin ointment 2 % Commonly known as: BACTROBAN Apply 1 Application topically 3 (three) times daily for 10 days.   nitroGLYCERIN 0.4 MG SL tablet Commonly known as: NITROSTAT Place 1 tablet (0.4 mg total) under the tongue every 5 (five) minutes x 3 doses as needed for chest pain.               Discharge Care Instructions  (From admission, onward)           Start     Ordered   07/05/22 0000  Leave dressing on - Keep it clean, dry, and intact until clinic visit        07/05/22 0852            Disposition and follow-up:   [name] was discharged from [facility] in Stable condition.  At the hospital follow up visit please address:  1.  Follow-up:  A.  Left fifth toe gangrene: Ensure surgical site is healing well.  Ensure good surgical site is pain control.  Ensure patient resume his home aspirin and Plavix.  Refills as necessary. Patient to follow up with Dr. Lajoyce Corners in 1 week.   B.  History of NSTEMI: Patient remains on aspirin and Plavix.  Continue to workup outpatient.  No  acute concerns during hospitalization for ACS. Consider taking patient off of DAPT moving forward as it has been over year he has been on aspirin and plavix.   C.  Elevated LFTs: On admission, patient elevated LFTs.  Patient had right upper quadrant ultrasound which was unrevealing.  Could have been reactive.  Follow-up CMP outpatient.  Follow-up LFTs outpatient.  D. Leukocytosis: Patient had elevated white count post op likely reactive in the setting of surgery. Please follow up outpatient to ensure resolution.   2.  Labs / imaging needed at time of follow-up: CBC, BMP  3.  Pending labs/ test needing follow-up: N/A  4.  Medication Changes  No medication changes made.   Follow-up Appointments:  Follow-up Information     Nadara Mustard, MD Follow up in 1 week(s).    Specialty: Orthopedic Surgery Contact information: [address] [place] [address] [phone]         Barbie Banner, MD. Schedule an appointment as soon as possible for a visit in 1 week(s).   Specialty: Family Medicine Why: Make hospital follow up appointment Contact information: [address] [place] [address] [phone]                 Hospital Course by problem list: John Griffin is a 72 y.o. with a pertinent past medical history of NSTEMI, CAD, OSA, hypertension who presents to the emergency department concerns of left toe pain.  Patient admitted for further evaluation management of left fifth toe gangrene.   #Gangrene of left fifth toe Patient presented to the emergency department concerns of left toe gangrene.  Patient initially stepped on a Lego a couple weeks ago, and initially was treated with antibiotics.  He noted that it turned black prior to arrival, and decided to present to the emergency room.  Patient was found to have gangrene of left fifth toe.  During hospital stay, patient had amputation.  Patient tolerated well.  Patient worked with PT/OT and progressed well during hospitalization.  Pain was well-controlled.  Initially held dual antiplatelet therapy with aspirin and Plavix in anticipation for surgery.  Resumed on discharge.  Did have ABIs during hospitalization in which showed minor right small vessel disease, but otherwise normal ABIs.   #Leukocytosis  Patient had elevated white count post op likely reactive in the setting of surgery. Please follow up outpatient to ensure resolution.    #History of NSTEMI #CAD #Hyperlipidemia #Heart failure with reduced ejection fraction No acute concerns during hospitalization for ACS or heart failure exacerbation.  Initially held aspirin and statin given anticipated surgery.  Postsurgery, hemoglobin remained stable, and patient was discharged with home aspirin and Plavix.  Patient continued  atorvastatin and Lasix during hospitalization.    #Hypertension Hospitalization, patient blood pressure measured well.  Patient remained on home losartan and Imdur.  Patient discharged with losartan 25 mg daily and Imdur 30 mg daily.   #OSA #COPD Patient did not have any concerns for acute COPD exacerbation during hospitalization.  Patient remained on CPAP at night.  Patient did not require any breathing treatments.    #Type 2 diabetes mellitus Metformin held on admission.  Resumed at discharge.     #Elevated LFTs Patient found to have elevated LFTs.  Patient did have right upper quadrant ultrasound during hospitalization, which did not show any concern for cirrhosis. AST and ALT remained stable during hospitalization, continue to work up outpatient.    Discharge Subjective:  Patient evaluated at bedside this AM. He has  a little bit of his pain where he had surgery, but not much. Has some drainage going into wound vac. He states that he is ready to go home.   Discharge Exam:   BP (!) 114/59 (BP Location: Left Arm)   Pulse 88   Temp 98.5 F (36.9 C) (Oral)   Resp 20   Ht 6\' 4"  (1.93 m)   Wt (!) 141.7 kg   SpO2 91%   BMI 38.03 kg/m  Constitutional: Resting in recliner, no acute distress Cardiovascular: regular rate and rhythm, no m/r/g Pulmonary/Chest: normal work of breathing on room air, lungs clear to auscultation bilaterally Abdominal: soft, non-tender, non-distended MSK: wound VAC to left foot post op, minimal bleeding appreciated, 1+ edema appreciated   Pertinent Labs, Studies, and Procedures:     Latest Ref Rng & Units 07/05/2022    1:39 AM 07/04/2022    2:40 AM 07/02/2022    9:53 AM  CBC  WBC 4.0 - 10.5 K/uL 11.4  9.5  9.1   Hemoglobin 13.0 - 17.0 g/dL 95.1  88.4  16.6   Hematocrit 39.0 - 52.0 % 42.2  43.9  48.2   Platelets 150 - 400 K/uL 180  190  200        Latest Ref Rng & Units 07/04/2022    2:40 AM 07/03/2022    8:57 AM 07/02/2022    9:53 AM  CMP  Glucose  70 - 99 mg/dL 063  016  010   BUN 8 - 23 mg/dL 15  11  13    Creatinine 0.61 - 1.24 mg/dL 9.32  3.55  7.32   Sodium 135 - 145 mmol/L 139  138  137   Potassium 3.5 - 5.1 mmol/L 3.6  3.5  3.9   Chloride 98 - 111 mmol/L 103  102  102   CO2 22 - 32 mmol/L 27  24  26    Calcium 8.9 - 10.3 mg/dL 8.8  8.9  9.0   Total Protein 6.5 - 8.1 g/dL  6.9  6.6   Total Bilirubin 0.3 - 1.2 mg/dL  1.1  1.0   Alkaline Phos 38 - 126 U/L  145  139   AST 15 - 41 U/L  44  47   ALT 0 - 44 U/L  54  54     US Abdomen Limited RUQ (LIVER/GB)  Result Date: 07/03/2022 CLINICAL DATA:  Elevated LFTs EXAM: ULTRASOUND ABDOMEN LIMITED RIGHT UPPER QUADRANT COMPARISON:  None Available. FINDINGS: Gallbladder: No gallstones or wall thickening visualized. No sonographic Murphy sign noted by sonographer. Common bile duct: Diameter: 4.6 mm. Liver: No focal lesion identified. Within normal limits in parenchymal echogenicity. Portal vein is patent on color Doppler imaging with normal direction of blood flow towards the liver. Other: None. IMPRESSION: No acute abnormality noted. Electronically Signed   By: Alcide Clever M.D.   On: 07/03/2022 23:30   VAS [place] ABI WITH/WO TBI  Result Date: 07/03/2022  LOWER EXTREMITY DOPPLER STUDY Patient Name:  John Griffin  Date of Exam:   07/03/2022 Medical Rec #: [id number]      Accession #:    [id number] Date of Birth: [birth year]     Patient Gender: M Patient Age:   5 years Exam Location:  [facility] Procedure:      VAS [place] ABI WITH/WO TBI Referring Phys: NISCHAL NARENDRA --------------------------------------------------------------------------------  Indications: Gangrene of 5th digit (left foot) High Risk Factors: Hypertension, hyperlipidemia, current smoker, prior MI,  coronary artery disease.  Comparison Study: No previous exams Performing Technologist: Hill, Jody RVT, RDMS  Examination Guidelines: A complete evaluation includes at minimum, Doppler waveform signals and systolic  blood pressure reading at the level of bilateral brachial, anterior tibial, and posterior tibial arteries, when vessel segments are accessible. Bilateral testing is considered an integral part of a complete examination. Photoelectric Plethysmograph (PPG) waveforms and toe systolic pressure readings are included as required and additional duplex testing as needed. Limited examinations for reoccurring indications may be performed as noted.  ABI Findings: +---------+------------------+-----+---------+--------+ Right    Rt Pressure (mmHg)IndexWaveform Comment  +---------+------------------+-----+---------+--------+ Brachial 131                    triphasic         +---------+------------------+-----+---------+--------+ PTA      143               1.09 triphasic         +---------+------------------+-----+---------+--------+ DP       122               0.93 triphasic         +---------+------------------+-----+---------+--------+ Great Toe172               1.31 Normal            +---------+------------------+-----+---------+--------+ +---------+------------------+-----+---------+-------+ Left     Lt Pressure (mmHg)IndexWaveform Comment +---------+------------------+-----+---------+-------+ Brachial 127                    triphasic        +---------+------------------+-----+---------+-------+ PTA      145               1.11 triphasic        +---------+------------------+-----+---------+-------+ DP       152               1.16 triphasic        +---------+------------------+-----+---------+-------+ Great Toe145               1.11 Normal           +---------+------------------+-----+---------+-------+ +-------+-----------+-----------+------------+------------+ ABI/TBIToday's ABIToday's TBIPrevious ABIPrevious TBI +-------+-----------+-----------+------------+------------+ Right  1.09       1.31                                 +-------+-----------+-----------+------------+------------+ Left   1.16       1.11                                +-------+-----------+-----------+------------+------------+  Summary: Right: Resting right ankle-brachial index is within normal range. The right toe-brachial index is elevated, likely representing small vessel calcification. Left: Resting left ankle-brachial index is within normal range. The left toe-brachial index is normal. *See table(s) above for measurements and observations.  Electronically signed by Sherald Hess MD on 07/03/2022 at 3:52:11 PM.    Final    DG Foot Complete Left  Result Date: 07/02/2022 CLINICAL DATA:  Fifth toe wound and ischemia with increasing pain and wound size after fall 9 days ago EXAM: LEFT FOOT - COMPLETE 3 VIEW COMPARISON:  Left foot radiographs dated 07/06/2018 FINDINGS: There is no evidence of fracture or dislocation. Degenerative changes of the great toe metatarsophalangeal joint. Soft tissue irregularity of the distal small toe. No definite underlying cortical erosion of the small toe. IMPRESSION: Soft tissue irregularity of the  distal small toe in keeping with reported wound. No definite underlying cortical erosion of the small toe to suggest osteomyelitis. Electronically Signed   By: Agustin Cree M.D.   On: 07/02/2022 10:44     Discharge Instructions: Discharge Instructions     Call MD for:  difficulty breathing, headache or visual disturbances   Complete by: As directed    Call MD for:  extreme fatigue   Complete by: As directed    Call MD for:  persistant nausea and vomiting   Complete by: As directed    Call MD for:  redness, tenderness, or signs of infection (pain, swelling, redness, odor or green/yellow discharge around incision site)   Complete by: As directed    Call MD for:  severe uncontrolled pain   Complete by: As directed    Call MD for:  temperature >100.4   Complete by: As directed    Diet - low sodium heart healthy    Complete by: As directed    Discharge instructions   Complete by: As directed    Mr. John Griffin,  It was a pleasure taking care of you at [facility] You were admitted for gangrene of the left toe and we had to amputate the toe. We are discharging you home now that you are doing better. Please follow the following instructions.   1) Please follow up with Dr. Audrie Lia office in about 1 week. Please call the office to schedule your appointment. [phone]. The address is [address] [place] [address]   2) We did not make any medications changes, please take all of your medications as prescribed.   3) Please follow up with your primary care doctor in about 1 week for hospital follow up.  4) If you start seeing the rest of your foot get darker, see worseing redness, or develop fevers please return back to the emergency department   Take care,  Dr. Modena Slater, DO   Increase activity slowly   Complete by: As directed    Leave dressing on - Keep it clean, dry, and intact until clinic visit   Complete by: As directed    Negative Pressure Wound Therapy - Incisional   Complete by: As directed        Signed: Modena Slater, DO 07/05/2022, 8:52 AM   Pager: [phone]

## 2022-07-05 NOTE — Progress Notes (Signed)
John Griffin to be D/C'd home per MD order. Discussed with the patient and all questions fully answered. Preveena wound vac connected to patient and instructions provided. Skin clean, dry and intact without evidence of skin break down, no evidence of skin tears noted.  IV catheter discontinued intact. Site without signs and symptoms of complications. Dressing and pressure applied.  An After Visit Summary was printed and given to the patient.  Patient escorted via WC, and D/C home via private auto.  [name]  07/05/2022

## 2022-07-05 NOTE — TOC CM/SW Note (Signed)
Transition of Care [facility]) - Inpatient Brief Assessment   Patient Details  Name: John Griffin MRN: [id number] Date of Birth: [birth year]  [facility] Care [facility]) CM/SW Contact:    Tom-Johnson, Hershal Coria, RN Phone Number: 07/05/2022, 2:36 PM   Clinical Narrative:  CM spoke with patient and wife at bedside about discharge needs. Admitted with Rt 5th toe gangrene. Orthopedics following. Patient states he was told he will be having the toe amputated tomorrow 07/04/22 by Dr. Lajoyce Corners.    From home with wife, has two supportive children. Retired, independent with care and drive self prior to admission. Has a cane, shower seat and CPAP at home. PCP is Benedetto Goad, MD/[place], [place] and uses [facility] in [place].    No TOC needs or recommendations noted at this time. CM will continue to follow as patient progresses with care towards discharge.    Transition of Care Asessment: Insurance and Status: Insurance coverage has been reviewed Patient has primary care physician: Yes Home environment has been reviewed: yes Prior level of function:: independent Prior/Current Home Services: No current home services Social Determinants of Health Reivew: SDOH reviewed no interventions necessary Readmission risk has been reviewed: Yes Transition of care needs: no transition of care needs at this time

## 2022-07-05 NOTE — Discharge Instructions (Addendum)
Mr. John Griffin,  It was a pleasure taking care of you at [facility]. You were admitted for gangrene of the left toe and we had to amputate the toe. We are discharging you home now that you are doing better. Please follow the following instructions.   1) Please follow up with Dr. Audrie Lia office in about 1 week. Please call the office to schedule your appointment. [phone]. The address is [address] [place] [address]   2) We did not make any medications changes, please take all of your medications as prescribed.   3) Please follow up with your primary care doctor in about 1 week for hospital follow up.  4) If you start seeing the rest of your foot get darker, see worseing redness, or develop fevers please return back to the emergency department   Take care,  Dr. Modena Slater, DO

## 2022-07-05 NOTE — Evaluation (Signed)
Occupational Therapy Evaluation Patient Details Name: John Griffin MRN: [id number] DOB: 03/03/1950 Today's Date: 07/05/2022   History of Present Illness 72 yo male admitted 6/12 with left toe gangrene s/p Lt 5th ray amp 6/14. PMhx: CAD, OSA, HTN, DM   Clinical Impression   Pt admitted for concerns listed above. PTA pt reported that he was independent with all ADL's and IADL's including driving, however he reports he fatigues quickly. At this time pt presents with decreased balance and endurance. He is requiring up to supervision level during ADL's, as well as increased time and effort with use of RW for safety. Pt has no further skilled OT needs at this time and will be discharged from acute OT.      Recommendations for follow up therapy are one component of a multi-disciplinary discharge planning process, led by the attending physician.  Recommendations may be updated based on patient status, additional functional criteria and insurance authorization.   Assistance Recommended at Discharge Set up Supervision/Assistance  Patient can return home with the following A little help with walking and/or transfers;A little help with bathing/dressing/bathroom;Assistance with cooking/housework;Help with stairs or ramp for entrance    Functional Status Assessment  Patient has had a recent decline in their functional status and demonstrates the ability to make significant improvements in function in a reasonable and predictable amount of time.  Equipment Recommendations   (RW)    Recommendations for Other Services       Precautions / Restrictions Precautions Precautions: Fall;Other (comment) Precaution Comments: VAC Restrictions Weight Bearing Restrictions: Yes LLE Weight Bearing: Weight bearing as tolerated Other Position/Activity Restrictions: heel weight bearing      Mobility Bed Mobility Overal bed mobility: Modified Independent                  Transfers Overall transfer  level: Modified independent                 General transfer comment: Increased time and effort      Balance Overall balance assessment: Mild deficits observed, not formally tested             Standing balance comment: RW for gait                           ADL either performed or assessed with clinical judgement   ADL Overall ADL's : Needs assistance/impaired Eating/Feeding: Independent;Sitting   Grooming: Supervision/safety;Standing   Upper Body Bathing: Independent;Sitting   Lower Body Bathing: Supervison/ safety;Sit to/from stand   Upper Body Dressing : Independent;Sitting   Lower Body Dressing: Supervision/safety;Sit to/from stand   Toilet Transfer: Supervision/safety;Ambulation   Toileting- Clothing Manipulation and Hygiene: Supervision/safety;Sit to/from stand       Functional mobility during ADLs: Supervision/safety;Rolling walker (2 wheels) General ADL Comments: Overall supervision for safety and increased time due to mild balance deficits     Vision Baseline Vision/History: 0 No visual deficits Ability to See in Adequate Light: 0 Adequate Patient Visual Report: No change from baseline Vision Assessment?: No apparent visual deficits     Perception Perception Perception Tested?: No   Praxis Praxis Praxis tested?: Not tested    Pertinent Vitals/Pain Pain Assessment Pain Assessment: No/denies pain     Hand Dominance Right   Extremity/Trunk Assessment Upper Extremity Assessment Upper Extremity Assessment: Overall WFL for tasks assessed   Lower Extremity Assessment Lower Extremity Assessment: Generalized weakness   Cervical / Trunk Assessment Cervical / Trunk Assessment: Kyphotic   Communication  Communication Communication: HOH   Cognition Arousal/Alertness: Awake/alert Behavior During Therapy: WFL for tasks assessed/performed Overall Cognitive Status: Within Functional Limits for tasks assessed                                        General Comments  VSS on RA. fatigues quickly    Exercises     Shoulder Instructions      Home Living Family/patient expects to be discharged to:: Private residence Living Arrangements: Spouse/significant other Available Help at Discharge: Family;Available PRN/intermittently Type of Home: House Home Access: Stairs to enter [employer] of Steps: 6 Entrance Stairs-Rails: Right;Left;Can reach both Home Layout: One level     Bathroom Shower/Tub: Producer, television/film/video: Standard Bathroom Accessibility: Yes How Accessible: Accessible via walker Home Equipment: Shower seat;Hand held shower head;Cane - single point          Prior Functioning/Environment Prior Level of Function : Independent/Modified Independent;Driving                        OT Problem List: Decreased strength;Decreased activity tolerance;Decreased knowledge of use of DME or AE;Impaired balance (sitting and/or standing)      OT Treatment/Interventions:      OT Goals(Current goals can be found in the care plan section) Acute Rehab OT Goals Patient Stated Goal: To go home OT Goal Formulation: With patient Time For Goal Achievement: 07/05/22 Potential to Achieve Goals: Good  OT Frequency:      Co-evaluation              AM-PAC OT "6 Clicks" Daily Activity     Outcome Measure Help from another person eating meals?: None Help from another person taking care of personal grooming?: A Little Help from another person toileting, which includes using toliet, bedpan, or urinal?: A Little Help from another person bathing (including washing, rinsing, drying)?: A Little Help from another person to put on and taking off regular upper body clothing?: None Help from another person to put on and taking off regular lower body clothing?: A Little 6 Click Score: 20   End of Session Equipment Utilized During Treatment: Rolling walker (2 wheels) Nurse  Communication: Mobility status  Activity Tolerance: Patient tolerated treatment well Patient left: with call bell/phone within reach;in chair;with family/visitor present  OT Visit Diagnosis: Unsteadiness on feet (R26.81);Other abnormalities of gait and mobility (R26.89);Muscle weakness (generalized) (M62.81)                Time: 1308-6578 OT Time Calculation (min): 22 min Charges:  OT General Charges $OT Visit: 1 Visit OT Evaluation $OT Eval Low Complexity: 1 Low  Trish Mage, OTR/L [facility]  Derrien Anschutz Elane Bing Plume 07/05/2022, 9:33 AM

## 2022-07-10 ENCOUNTER — Encounter: Payer: Self-pay | Admitting: Orthopedic Surgery

## 2022-07-11 ENCOUNTER — Ambulatory Visit (HOSPITAL_BASED_OUTPATIENT_CLINIC_OR_DEPARTMENT_OTHER): Admission: RE | Admit: 2022-07-11 | Payer: Medicare Other | Source: Ambulatory Visit

## 2022-07-11 ENCOUNTER — Other Ambulatory Visit (HOSPITAL_BASED_OUTPATIENT_CLINIC_OR_DEPARTMENT_OTHER): Payer: Self-pay | Admitting: Family Medicine

## 2022-07-11 DIAGNOSIS — I824Y2 Acute embolism and thrombosis of unspecified deep veins of left proximal lower extremity: Secondary | ICD-10-CM

## 2022-07-12 ENCOUNTER — Ambulatory Visit (HOSPITAL_BASED_OUTPATIENT_CLINIC_OR_DEPARTMENT_OTHER)
Admission: RE | Admit: 2022-07-12 | Discharge: 2022-07-12 | Disposition: A | Discharge status: Home / Self Care | Payer: Medicare Other | Source: Ambulatory Visit | Attending: Family Medicine | Admitting: Family Medicine

## 2022-07-12 DIAGNOSIS — I824Y2 Acute embolism and thrombosis of unspecified deep veins of left proximal lower extremity: Secondary | ICD-10-CM

## 2022-07-14 ENCOUNTER — Ambulatory Visit (INDEPENDENT_AMBULATORY_CARE_PROVIDER_SITE_OTHER): Payer: Medicare Other | Admitting: Orthopedic Surgery

## 2022-07-14 ENCOUNTER — Encounter: Payer: Self-pay | Admitting: Orthopedic Surgery

## 2022-07-14 DIAGNOSIS — Z89422 Acquired absence of other left toe(s): Secondary | ICD-10-CM

## 2022-07-14 NOTE — Progress Notes (Signed)
To clarify final discharge diagnosis: Chronic systolic heart failure.  Gust Rung, DO 1:13 PM 07/14/22

## 2022-07-14 NOTE — Progress Notes (Signed)
Office Visit Note   Patient: John Griffin           Date of Birth: 1950-09-06           MRN: [id number] Visit Date: 07/14/2022              Requested by: Barbie Banner, MD [address] [place],  [place] [address] PCP: Barbie Banner, MD  Chief Complaint  Patient presents with   Left Foot - Routine Post Op    07/04/2022 left foot 5th ray amputation       HPI: Patient is a 72 year old gentleman who is 10 days status post left foot fifth ray amputation.  The wound VAC is removed.  Assessment & Plan: Visit Diagnoses:  1. History of complete ray amputation of fifth toe of left foot (HCC)     Plan: Patient will begin Dial soap cleansing dry dressing changes elevation nonweightbearing.  Follow-Up Instructions: Return in about 3 weeks (around 08/04/2022).   Ortho Exam  Patient is alert, oriented, no adenopathy, well-dressed, normal affect, normal respiratory effort. Examination the wound edges are well-approximated there is no dehiscence no cellulitis no drainage there is some mild maceration.  Imaging: No results found. No images are attached to the encounter.  Labs: Lab Results  Component Value Date   HGBA1C 6.0 (H) 02/03/2016   HGBA1C 5.8 (H) 02/24/2015   REPTSTATUS 07/08/2018 FINAL 07/05/2018   GRAMSTAIN  07/05/2018    RARE WBC PRESENT, PREDOMINANTLY MONONUCLEAR FEW GRAM POSITIVE COCCI MODERATE GRAM NEGATIVE RODS    CULT  07/05/2018    MODERATE ALCALIGENES FAECALIS WITHIN MIXED GROWTH Performed at [facility], [address] [place], [place] [address]    Ritta Slot 07/05/2018     Lab Results  Component Value Date   ALBUMIN 3.5 07/03/2022   ALBUMIN 3.4 (L) 07/02/2022   ALBUMIN 4.0 06/04/2021    Lab Results  Component Value Date   MG 2.3 02/25/2015   No results found for: "VD25OH"  No results found for: "PREALBUMIN"    Latest Ref Rng & Units 07/05/2022    1:39 AM 07/04/2022    2:40 AM 07/02/2022    9:53 AM  CBC  EXTENDED  WBC 4.0 - 10.5 K/uL 11.4  9.5  9.1   RBC 4.22 - 5.81 MIL/uL 4.45  4.66  4.98   Hemoglobin 13.0 - 17.0 g/dL 82.9  56.2  13.0   HCT 39.0 - 52.0 % 42.2  43.9  48.2   Platelets 150 - 400 K/uL 180  190  200   NEUT# 1.7 - 7.7 K/uL   6.0   Lymph# 0.7 - 4.0 K/uL   1.9      There is no height or weight on file to calculate BMI.  Orders:  No orders of the defined types were placed in this encounter.  No orders of the defined types were placed in this encounter.    Procedures: No procedures performed  Clinical Data: No additional findings.  ROS:  All other systems negative, except as noted in the HPI. Review of Systems  Objective: Vital Signs: There were no vitals taken for this visit.  Specialty Comments:  No specialty comments available.  PMFS History: Patient Active Problem List   Diagnosis Date Noted   Gangrene of toe (HCC) 07/02/2022   Venous ulcer of right lower extremity with varicose veins (HCC) 07/02/2022   Thoracic aortic atherosclerosis (HCC) 11/27/2021   Elevated liver function tests 09/22/2018  Prediabetes 09/22/2018   CAD (coronary artery disease) 08/19/2016   Dyslipidemia, goal LDL below 70 02/29/2016   NSTEMI (non-ST elevation myocardial infarction) (HCC) 02/03/2016   Acute CHF (HCC) 02/03/2016   OSA (obstructive sleep apnea) 06/27/2015   COPD (chronic obstructive pulmonary disease) (HCC) 03/14/2015   Hypersomnia 03/14/2015   Exertional dyspnea 03/14/2015   Obesity 03/14/2015   Osteoarthritis 03/01/2015   Tobacco use    Essential hypertension    Prostate cancer (HCC) 08/17/2014   Past Medical History:  Diagnosis Date   Arthritis    hands  and shoulders   CAD (coronary artery disease)    a. 01/2016: NSTEMI with cath showing 100% Prox Cx stenosis, 70% Prox LAD, 75% 1st Diag, and 60-70% RCA stenosis with medical therapy pursued as the LCx had collateral flow noted   COPD (chronic obstructive pulmonary disease) (HCC)    Dyspnea     Hypertension    Myocardial infarction (HCC)    01/2016   Pneumonia    Prostate cancer [facility])    s/p prostatectomy   Sleep apnea    cpap   Tobacco use    Umbilical hernia    Urinary frequency     Family History  Problem Relation Age of Onset   Dementia Mother    Heart attack Father 69   CAD Father        s/p cabg    Past Surgical History:  Procedure Laterality Date   AMPUTATION Left 07/04/2022   Procedure: LEFT FOOT 5TH RAY AMPUTATION;  Surgeon: Nadara Mustard, MD;  Location: [facility] OR;  Service: Orthopedics;  Laterality: Left;   bone graft right ankle   1972   CARDIAC CATHETERIZATION N/A 02/04/2016   Procedure: Left Heart Cath and Coronary Angiography;  Surgeon: Runell Gess, MD;  Location: [facility] INVASIVE CV LAB;  Service: Cardiovascular;  Laterality: N/A;   COLONOSCOPY WITH PROPOFOL N/A 09/19/2016   Procedure: COLONOSCOPY WITH PROPOFOL;  Surgeon: Jeani Hawking, MD;  Location: [facility] ENDOSCOPY;  Service: Endoscopy;  Laterality: N/A;   COLONOSCOPY WITH PROPOFOL N/A 10/04/2021   Procedure: COLONOSCOPY WITH PROPOFOL;  Surgeon: Jeani Hawking, MD;  Location: [facility] ENDOSCOPY;  Service: Gastroenterology;  Laterality: N/A;   HERNIA REPAIR     LYMPHADENECTOMY Bilateral 08/17/2014   Procedure: LYMPHADENECTOMY;  Surgeon: Heloise Purpura, MD;  Location: [facility] ORS;  Service: Urology;  Laterality: Bilateral;   POLYPECTOMY  10/04/2021   Procedure: POLYPECTOMY;  Surgeon: Jeani Hawking, MD;  Location: [facility];  Service: Gastroenterology;;   right shoulder surgery   1992    ROBOT ASSISTED LAPAROSCOPIC RADICAL PROSTATECTOMY N/A 08/17/2014   Procedure: ROBOTIC ASSISTED LAPAROSCOPIC RADICAL PROSTATECTOMY LEVEL 3  AND UMBILICAL HERNIA REPAIR;  Surgeon: Heloise Purpura, MD;  Location: [facility] ORS;  Service: Urology;  Laterality: N/A;   Social History   Occupational History   Not on file  Tobacco Use   Smoking status: Every Day    Packs/day: 1.00    Years: 47.00    Additional pack years: 0.00    Total pack years:  47.00    Types: Cigarettes   Smokeless tobacco: Never   Tobacco comments:    back to smoking a 1 ppd since 2018  Vaping Use   Vaping Use: Never used  Substance and Sexual Activity   Alcohol use: Yes    Alcohol/week: 0.0 standard drinks of alcohol    Comment: occasional    Drug use: No   Sexual activity: Yes

## 2022-07-25 ENCOUNTER — Encounter: Payer: Self-pay | Admitting: Cardiovascular Disease

## 2022-07-25 ENCOUNTER — Ambulatory Visit: Payer: Medicare Other | Attending: Cardiovascular Disease | Admitting: Cardiovascular Disease

## 2022-07-25 VITALS — BP 118/73 | HR 86 | Ht 76.0 in | Wt 310.8 lb

## 2022-07-25 DIAGNOSIS — I10 Essential (primary) hypertension: Secondary | ICD-10-CM | POA: Diagnosis not present

## 2022-07-25 DIAGNOSIS — I2510 Atherosclerotic heart disease of native coronary artery without angina pectoris: Secondary | ICD-10-CM | POA: Insufficient documentation

## 2022-07-25 DIAGNOSIS — E785 Hyperlipidemia, unspecified: Secondary | ICD-10-CM | POA: Diagnosis not present

## 2022-07-25 DIAGNOSIS — G4733 Obstructive sleep apnea (adult) (pediatric): Secondary | ICD-10-CM | POA: Insufficient documentation

## 2022-07-25 DIAGNOSIS — Z720 Tobacco use: Secondary | ICD-10-CM | POA: Diagnosis not present

## 2022-07-25 NOTE — Assessment & Plan Note (Signed)
History of non-STEMI 02/03/2016 with post MI angina.  I catheterized him on 02/04/2016 revealing an occluded nondominant circumflex, 70% proximal LAD and mid dominant RCA with an EF of 50 to 55%.  There was inferoapical hypokinesia.  I elected not to intervene because of the subacute nature of the occlusion.  He has done well since that time denying chest pain or shortness of breath.

## 2022-07-25 NOTE — Assessment & Plan Note (Signed)
History of dyslipidemia on high-dose statin therapy with lipid profile performed 06/04/2021 revealing total cholesterol 104, LDL 51 and HDL 34.

## 2022-07-25 NOTE — Assessment & Plan Note (Signed)
History of obstructive sleep apnea on CPAP. 

## 2022-07-25 NOTE — Assessment & Plan Note (Signed)
History of essential hypertension with blood pressure measured at 118/73.  He is on losartan and metoprolol.

## 2022-07-25 NOTE — Assessment & Plan Note (Signed)
History of ongoing tobacco abuse of 1 pack/day recalcitrant to risk factor modification. 

## 2022-07-25 NOTE — Patient Instructions (Signed)
Medication Instructions:  Your physician recommends that you continue on your current medications as directed. Please refer to the Current Medication list given to you today.  *If you need a refill on your cardiac medications before your next appointment, please call your pharmacy*   Lab Work: Your physician recommends that you have labs drawn today: Lipid/Liver panel  If you have labs (blood work) drawn today and your tests are completely normal, you will receive your results only by: MyChart Message (if you have MyChart) OR A paper copy in the mail If you have any lab test that is abnormal or we need to change your treatment, we will call you to review the results.   Follow-Up: At [facility], you and your health needs are our priority.  As part of our continuing mission to provide you with exceptional heart care, we have created designated Provider Care Teams.  These Care Teams include your primary Cardiologist (physician) and Advanced Practice Providers (APPs -  Physician Assistants and Nurse Practitioners) who all work together to provide you with the care you need, when you need it.  We recommend signing up for the patient portal called "MyChart".  Sign up information is provided on this After Visit Summary.  MyChart is used to connect with patients for Virtual Visits (Telemedicine).  Patients are able to view lab/test results, encounter notes, upcoming appointments, etc.  Non-urgent messages can be sent to your provider as well.   To learn more about what you can do with MyChart, go to [email or web address].    Your next appointment:   12 month(s)  Provider:   Jonathan Berry, MD    

## 2022-07-25 NOTE — Progress Notes (Signed)
07/25/2022 John Griffin   August 21, 1950  [id number]  Primary Physician Barbie Banner, MD Primary Cardiologist: Runell Gess MD Nicholes Calam[place], [place]  HPI:  John Griffin is a 72 y.o.  moderately overweight married Caucasian male father 2, grandfather to 3 grandchildren whose wife John Griffin is also patient in mind. He is retired Social research officer, government. I last saw him in the office   06/04/2021.Marland Kitchen He has a history of discontinued tobacco abuse of 45 pack years and COPD, treated hypertension and hyperlipidemia. He had non-STEMI 6 days prior to admission on 02/03/16 with post-MI angina. I catheterized him on 02/04/16 revealing 100 nondominant circumflex, 70% proximal LAD and mid dominant RCA and EF of 50-55%. There was inferoapical hypokinesia. I elected not to intervene because of the subacute nature of the occlusion.    Since I saw him a year ago he remained stable.  He unfortunately continues to smoke a pack per day recalcitrant to respect modification.  He does have some shortness of breath probably related to COPD.  He denies chest pain.  He had trauma to his left fifth toe with resultant ulcer that was nonhealing.  He ultimately underwent left fifth toe ray resection by Dr. Lajoyce Corners .   Current Meds  Medication Sig   albuterol (VENTOLIN HFA) 108 (90 Base) MCG/ACT inhaler Inhale 2 puffs into the lungs every 4 (four) hours as needed for wheezing or shortness of breath.   aspirin 81 MG chewable tablet Chew 1 tablet (81 mg total) by mouth daily.   atorvastatin (LIPITOR) 80 MG tablet TAKE 1 TABLET BY MOUTH EVERY DAY   cetirizine (ZYRTEC) 10 MG tablet Take 10 mg by mouth daily.   Chlorpheniramine-DM (CORICIDIN HBP COUGH/COLD PO) Take 1 tablet by mouth in the morning and at bedtime. Sinus congestion.   clopidogrel (PLAVIX) 75 MG tablet TAKE 1 TABLET BY MOUTH EVERY DAY   fluticasone (FLONASE) 50 MCG/ACT nasal spray Place 2 sprays into both nostrils daily as needed (nasal allergies).   furosemide  (LASIX) 20 MG tablet TAKE 1 TABLET BY MOUTH EVERY DAY   isosorbide mononitrate (IMDUR) 30 MG 24 hr tablet TAKE 1 TABLET BY MOUTH EVERY DAY   KLOR-CON M10 10 MEQ tablet TAKE 1 TABLET BY MOUTH EVERY DAY   losartan (COZAAR) 25 MG tablet Take 1 tablet (25 mg total) by mouth daily.   metFORMIN (GLUCOPHAGE) 500 MG tablet Take 500 mg by mouth in the morning and at bedtime.   metoprolol tartrate (LOPRESSOR) 25 MG tablet Take 1 tablet (25 mg total) by mouth 2 (two) times daily.   nitroGLYCERIN (NITROSTAT) 0.4 MG SL tablet Place 1 tablet (0.4 mg total) under the tongue every 5 (five) minutes x 3 doses as needed for chest pain.     Allergies  Allergen Reactions   Lisinopril Cough    Social History   Socioeconomic History   Marital status: Married    Spouse name: Not on file   Number of children: Not on file   Years of education: Not on file   Highest education level: Not on file  Occupational History   Not on file  Tobacco Use   Smoking status: Every Day    Packs/day: 1.00    Years: 47.00    Additional pack years: 0.00    Total pack years: 47.00    Types: Cigarettes   Smokeless tobacco: Never   Tobacco comments:    back to smoking a 1 ppd since 2018  Vaping Use  Vaping Use: Never used  Substance and Sexual Activity   Alcohol use: Yes    Alcohol/week: 0.0 standard drinks of alcohol    Comment: occasional    Drug use: No   Sexual activity: Yes  Other Topics Concern   Not on file  Social History Narrative   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 Allyson Sabal with [facility]   Social Determinants of Health   Financial Resource Strain: Not on file  Food Insecurity: No Food Insecurity (07/02/2022)   Hunger Vital Sign    Worried About Running Out of Food in the Last Year: Never true    Ran Out of Food in the Last Year: Never true  Transportation Needs: No Transportation Needs (07/02/2022)   PRAPARE - Doctor, general practice (Medical): No    Lack of Transportation (Non-Medical): No  Physical Activity: Not on file  Stress: Not on file  Social Connections: Not on file  Intimate Partner Violence: Not At Risk (07/02/2022)   Humiliation, Afraid, Rape, and Kick questionnaire    Fear of Current or Ex-Partner: No    Emotionally Abused: No    Physically Abused: No    Sexually Abused: No     Review of Systems: General: negative for chills, fever, night sweats or weight changes.  Cardiovascular: negative for chest pain, dyspnea on exertion, edema, orthopnea, palpitations, paroxysmal nocturnal dyspnea or shortness of breath Dermatological: negative for rash Respiratory: negative for cough or wheezing Urologic: negative for hematuria Abdominal: negative for nausea, vomiting, diarrhea, bright red blood per rectum, melena, or hematemesis Neurologic: negative for visual changes, syncope, or dizziness All other systems reviewed and are otherwise negative except as noted above.    Blood pressure 118/73, pulse 86, height 6\' 4"  (1.93 m), weight (!) 310 lb 12.8 oz (141 kg), SpO2 92 %.  General appearance: alert and no distress Neck: no adenopathy, no carotid bruit, no JVD, supple, symmetrical, trachea midline, and thyroid not enlarged, symmetric, no tenderness/mass/nodules Lungs: clear to auscultation bilaterally Heart: regular rate and rhythm, S1, S2 normal, no murmur, click, rub or gallop Extremities: extremities normal, atraumatic, no cyanosis or edema Pulses: 2+ and symmetric Skin: Skin color, texture, turgor normal. No rashes or lesions Neurologic: Grossly normal  EKG EKG Interpretation Date/Time:  Friday July 25 2022 08:00:28 EDT Ventricular Rate:  86 PR Interval:  190 QRS Duration:  116 QT Interval:  390 QTC Calculation: 466 R Axis:   -58  Text Interpretation: Normal sinus rhythm Left anterior fascicular block When compared with ECG of 04-Feb-2016 06:52, ST no longer depressed in  Anterolateral leads Confirmed by Nanetta Batty [phone]) on 07/25/2022 8:11:53 AM    ASSESSMENT AND PLAN:   Tobacco use History of ongoing tobacco abuse of 1 pack/day recalcitrant to risk factor modification.  Essential hypertension History of essential hypertension with blood pressure measured at 118/73.  He is on losartan and metoprolol.  OSA (obstructive sleep apnea) History of obstructive sleep apnea on CPAP  CAD (coronary artery disease) History of non-STEMI 02/03/2016 with post MI angina.  I catheterized him on 02/04/2016 revealing an occluded nondominant circumflex, 70% proximal LAD and mid dominant RCA with an EF of 50 to 55%.  There was inferoapical hypokinesia.  I elected not to intervene because of the subacute nature of the occlusion.  He has done well since that time denying chest pain or shortness of breath.  Dyslipidemia, goal LDL  below 70 History of dyslipidemia on high-dose statin therapy with lipid profile performed 06/04/2021 revealing total cholesterol 104, LDL 51 and HDL 34.     Runell Gess MD [facility], [facility] 07/25/2022 8:28 AM

## 2022-07-26 LAB — LIPID PANEL
Chol/HDL Ratio: 3.6 ratio (ref 0.0–5.0)
Cholesterol, Total: 104 mg/dL (ref 100–199)
HDL: 29 mg/dL — ABNORMAL LOW (ref >39)
LDL Chol Calc (NIH): 55 mg/dL (ref 0–99)
Triglycerides: 105 mg/dL (ref 0–149)
VLDL Cholesterol Cal: 20 mg/dL (ref 5–40)

## 2022-07-26 LAB — HEPATIC FUNCTION PANEL
ALT: 79 IU/L — ABNORMAL HIGH (ref 0–44)
AST: 50 IU/L — ABNORMAL HIGH (ref 0–40)
Albumin: 3.9 g/dL (ref 3.8–4.8)
Alkaline Phosphatase: 140 IU/L — ABNORMAL HIGH (ref 44–121)
Bilirubin Total: 0.7 mg/dL (ref 0.0–1.2)
Bilirubin, Direct: 0.24 mg/dL (ref 0.00–0.40)
Total Protein: 6.6 g/dL (ref 6.0–8.5)

## 2022-07-28 ENCOUNTER — Other Ambulatory Visit: Payer: Self-pay

## 2022-07-28 DIAGNOSIS — E785 Hyperlipidemia, unspecified: Secondary | ICD-10-CM

## 2022-08-04 ENCOUNTER — Ambulatory Visit: Payer: Medicare Other | Admitting: Orthopedic Surgery

## 2022-08-04 DIAGNOSIS — Z89422 Acquired absence of other left toe(s): Secondary | ICD-10-CM

## 2022-08-05 ENCOUNTER — Encounter: Payer: Self-pay | Admitting: Orthopedic Surgery

## 2022-08-05 NOTE — Progress Notes (Signed)
Office Visit Note   Patient: John Griffin           Date of Birth: [birth year]           MRN: [id number] Visit Date: 08/04/2022              Requested by: Barbie Banner, MD [address] [place],  [place] [address] PCP: Barbie Banner, MD  Chief Complaint  Patient presents with   Left Foot - Routine Post Op    07/04/2022 left foot 5th ray amputation       HPI: Patient is a 72 year old gentleman who is seen 4 weeks status post left foot fifth ray amputation.  Patient is weightbearing in a postoperative shoe and walker.  Assessment & Plan: Visit Diagnoses:  1. History of complete ray amputation of fifth toe of left foot (HCC)     Plan: Necrotic tissue was debrided patient has a strong pulse will continue with Dial soap cleansing protected weightbearing.  Follow-Up Instructions: Return in about 2 weeks (around 08/18/2022).   Ortho Exam  Patient is alert, oriented, no adenopathy, well-dressed, normal affect, normal respiratory effort. Examination there is some necrotic tissue along the incision the tissue was debrided there was good brisk bleeding.  Patient has a strong dorsalis pedis pulse to palpation.  The ulcer measures 2 x 4 cm.  Imaging: No results found. No images are attached to the encounter.  Labs: Lab Results  Component Value Date   HGBA1C 6.0 (H) 02/03/2016   HGBA1C 5.8 (H) 02/24/2015   REPTSTATUS 07/08/2018 FINAL 07/05/2018   GRAMSTAIN  07/05/2018    RARE WBC PRESENT, PREDOMINANTLY MONONUCLEAR FEW GRAM POSITIVE COCCI MODERATE GRAM NEGATIVE RODS    CULT  07/05/2018    MODERATE ALCALIGENES FAECALIS WITHIN MIXED GROWTH Performed at [facility], [address] [place], [place] [address]    Ritta Slot 07/05/2018     Lab Results  Component Value Date   ALBUMIN 3.9 07/25/2022   ALBUMIN 3.5 07/03/2022   ALBUMIN 3.4 (L) 07/02/2022    Lab Results  Component Value Date   MG 2.3 02/25/2015   No results found for:  "VD25OH"  No results found for: "PREALBUMIN"    Latest Ref Rng & Units 07/05/2022    1:39 AM 07/04/2022    2:40 AM 07/02/2022    9:53 AM  CBC EXTENDED  WBC 4.0 - 10.5 K/uL 11.4  9.5  9.1   RBC 4.22 - 5.81 MIL/uL 4.45  4.66  4.98   Hemoglobin 13.0 - 17.0 g/dL 86.5  78.4  69.6   HCT 39.0 - 52.0 % 42.2  43.9  48.2   Platelets 150 - 400 K/uL 180  190  200   NEUT# 1.7 - 7.7 K/uL   6.0   Lymph# 0.7 - 4.0 K/uL   1.9      There is no height or weight on file to calculate BMI.  Orders:  No orders of the defined types were placed in this encounter.  No orders of the defined types were placed in this encounter.    Procedures: No procedures performed  Clinical Data: No additional findings.  ROS:  All other systems negative, except as noted in the HPI. Review of Systems  Objective: Vital Signs: There were no vitals taken for this visit.  Specialty Comments:  No specialty comments available.  PMFS History: Patient Active Problem List   Diagnosis Date Noted   Gangrene of toe (HCC) 07/02/2022  Venous ulcer of right lower extremity with varicose veins (HCC) 07/02/2022   Thoracic aortic atherosclerosis (HCC) 11/27/2021   Elevated liver function tests 09/22/2018   Prediabetes 09/22/2018   CAD (coronary artery disease) 08/19/2016   Dyslipidemia, goal LDL below 70 02/29/2016   NSTEMI (non-ST elevation myocardial infarction) (HCC) 02/03/2016   Acute CHF (HCC) 02/03/2016   OSA (obstructive sleep apnea) 06/27/2015   COPD (chronic obstructive pulmonary disease) (HCC) 03/14/2015   Hypersomnia 03/14/2015   Exertional dyspnea 03/14/2015   Obesity 03/14/2015   Osteoarthritis 03/01/2015   Tobacco use    Essential hypertension    Prostate cancer (HCC) 08/17/2014   Past Medical History:  Diagnosis Date   Arthritis    hands  and shoulders   CAD (coronary artery disease)    a. 01/2016: NSTEMI with cath showing 100% Prox Cx stenosis, 70% Prox LAD, 75% 1st Diag, and 60-70% RCA  stenosis with medical therapy pursued as the LCx had collateral flow noted   COPD (chronic obstructive pulmonary disease) (HCC)    Dyspnea    Hypertension    Myocardial infarction (HCC)    01/2016   Pneumonia    Prostate cancer [facility])    s/p prostatectomy   Sleep apnea    cpap   Tobacco use    Umbilical hernia    Urinary frequency     Family History  Problem Relation Age of Onset   Dementia Mother    Heart attack Father 29   CAD Father        s/p cabg    Past Surgical History:  Procedure Laterality Date   AMPUTATION Left 07/04/2022   Procedure: LEFT FOOT 5TH RAY AMPUTATION;  Surgeon: Nadara Mustard, MD;  Location: [facility] OR;  Service: Orthopedics;  Laterality: Left;   bone graft right ankle   1972   CARDIAC CATHETERIZATION N/A 02/04/2016   Procedure: Left Heart Cath and Coronary Angiography;  Surgeon: Runell Gess, MD;  Location: [facility] INVASIVE CV LAB;  Service: Cardiovascular;  Laterality: N/A;   COLONOSCOPY WITH PROPOFOL N/A 09/19/2016   Procedure: COLONOSCOPY WITH PROPOFOL;  Surgeon: Jeani Hawking, MD;  Location: [facility];  Service: Endoscopy;  Laterality: N/A;   COLONOSCOPY WITH PROPOFOL N/A 10/04/2021   Procedure: COLONOSCOPY WITH PROPOFOL;  Surgeon: Jeani Hawking, MD;  Location: [facility];  Service: Gastroenterology;  Laterality: N/A;   HERNIA REPAIR     LYMPHADENECTOMY Bilateral 08/17/2014   Procedure: LYMPHADENECTOMY;  Surgeon: Heloise Purpura, MD;  Location: [facility];  Service: Urology;  Laterality: Bilateral;   POLYPECTOMY  10/04/2021   Procedure: POLYPECTOMY;  Surgeon: Jeani Hawking, MD;  Location: [facility];  Service: Gastroenterology;;   right shoulder surgery   1992    ROBOT ASSISTED LAPAROSCOPIC RADICAL PROSTATECTOMY N/A 08/17/2014   Procedure: ROBOTIC ASSISTED LAPAROSCOPIC RADICAL PROSTATECTOMY LEVEL 3  AND UMBILICAL HERNIA REPAIR;  Surgeon: Heloise Purpura, MD;  Location: [facility];  Service: Urology;  Laterality: N/A;   Social History   Occupational History   Not  on file  Tobacco Use   Smoking status: Every Day    Current packs/day: 1.00    Average packs/day: 1 pack/day for 47.0 years (47.0 ttl pk-yrs)    Types: Cigarettes   Smokeless tobacco: Never   Tobacco comments:    back to smoking a 1 ppd since 2018  Vaping Use   Vaping status: Never Used  Substance and Sexual Activity   Alcohol use: Yes    Alcohol/week: 0.0 standard drinks of alcohol    Comment:  occasional    Drug use: No   Sexual activity: Yes

## 2022-08-18 ENCOUNTER — Ambulatory Visit: Payer: Medicare Other | Admitting: Orthopedic Surgery

## 2022-08-18 DIAGNOSIS — Z89422 Acquired absence of other left toe(s): Secondary | ICD-10-CM

## 2022-08-29 ENCOUNTER — Other Ambulatory Visit: Payer: Self-pay | Admitting: Cardiovascular Disease

## 2022-08-30 ENCOUNTER — Other Ambulatory Visit: Payer: Self-pay | Admitting: Cardiovascular Disease

## 2022-09-01 ENCOUNTER — Encounter: Payer: Self-pay | Admitting: Orthopedic Surgery

## 2022-09-01 NOTE — Progress Notes (Signed)
Patient is a 72 year old gentleman who is 6 weeks status post left foot fifth ray amputation he is in a postoperative shoe and a walker.  Examination there is small amount of serosanguineous drainage.  There is an ulcer that is 2 x 3 cm.  Patient will continue with wound care dressing changes minimize weightbearing.

## 2022-09-08 ENCOUNTER — Ambulatory Visit: Payer: Medicare Other | Admitting: Orthopedic Surgery

## 2022-09-08 DIAGNOSIS — Z89422 Acquired absence of other left toe(s): Secondary | ICD-10-CM

## 2022-09-09 ENCOUNTER — Encounter: Payer: Self-pay | Admitting: Orthopedic Surgery

## 2022-09-09 NOTE — Progress Notes (Signed)
Office Visit Note   Patient: John Griffin           Date of Birth: 04-Feb-1950           MRN: [id number] Visit Date: 09/08/2022              Requested by: Barbie Banner, MD [address] [place],  [place] [address] PCP: Barbie Banner, MD  Chief Complaint  Patient presents with   Left Foot - Routine Post Op    07/04/2022 left foot 5th ray amputation       HPI: Patient is a 72 year old gentleman who is 2 months status post left foot fifth ray amputation.  Assessment & Plan: Visit Diagnoses:  1. History of complete ray amputation of fifth toe of left foot (HCC)     Plan: Continue with routine wound care.  Follow-Up Instructions: Return in about 4 weeks (around 10/06/2022).   Ortho Exam  Patient is alert, oriented, no adenopathy, well-dressed, normal affect, normal respiratory effort. Examination patient has 2 ulcers 1 is 5 mm in diameter the other is 1 x 0.5 cm with healthy granulation tissue we will continue with Dial soap cleansing dry dressing changes and compression.  Imaging: No results found. No images are attached to the encounter.  Labs: Lab Results  Component Value Date   HGBA1C 6.0 (H) 02/03/2016   HGBA1C 5.8 (H) 02/24/2015   REPTSTATUS 07/08/2018 FINAL 07/05/2018   GRAMSTAIN  07/05/2018    RARE WBC PRESENT, PREDOMINANTLY MONONUCLEAR FEW GRAM POSITIVE COCCI MODERATE GRAM NEGATIVE RODS    CULT  07/05/2018    MODERATE ALCALIGENES FAECALIS WITHIN MIXED GROWTH Performed at [facility], [address], [place], [place] [address]    Ritta Slot 07/05/2018     Lab Results  Component Value Date   ALBUMIN 3.9 07/25/2022   ALBUMIN 3.5 07/03/2022   ALBUMIN 3.4 (L) 07/02/2022    Lab Results  Component Value Date   MG 2.3 02/25/2015   No results found for: "VD25OH"  No results found for: "PREALBUMIN"    Latest Ref Rng & Units 07/05/2022    1:39 AM 07/04/2022    2:40 AM 07/02/2022    9:53 AM  CBC EXTENDED  WBC 4.0 -  10.5 K/uL 11.4  9.5  9.1   RBC 4.22 - 5.81 MIL/uL 4.45  4.66  4.98   Hemoglobin 13.0 - 17.0 g/dL 66.0  63.0  16.0   HCT 39.0 - 52.0 % 42.2  43.9  48.2   Platelets 150 - 400 K/uL 180  190  200   NEUT# 1.7 - 7.7 K/uL   6.0   Lymph# 0.7 - 4.0 K/uL   1.9      There is no height or weight on file to calculate BMI.  Orders:  No orders of the defined types were placed in this encounter.  No orders of the defined types were placed in this encounter.    Procedures: No procedures performed  Clinical Data: No additional findings.  ROS:  All other systems negative, except as noted in the HPI. Review of Systems  Objective: Vital Signs: There were no vitals taken for this visit.  Specialty Comments:  No specialty comments available.  PMFS History: Patient Active Problem List   Diagnosis Date Noted   Gangrene of toe (HCC) 07/02/2022   Venous ulcer of right lower extremity with varicose veins (HCC) 07/02/2022   Thoracic aortic atherosclerosis (HCC) 11/27/2021   Elevated liver function tests  09/22/2018   Prediabetes 09/22/2018   CAD (coronary artery disease) 08/19/2016   Dyslipidemia, goal LDL below 70 02/29/2016   NSTEMI (non-ST elevation myocardial infarction) (HCC) 02/03/2016   Acute CHF (HCC) 02/03/2016   OSA (obstructive sleep apnea) 06/27/2015   COPD (chronic obstructive pulmonary disease) (HCC) 03/14/2015   Hypersomnia 03/14/2015   Exertional dyspnea 03/14/2015   Obesity 03/14/2015   Osteoarthritis 03/01/2015   Tobacco use    Essential hypertension    Prostate cancer (HCC) 08/17/2014   Past Medical History:  Diagnosis Date   Arthritis    hands  and shoulders   CAD (coronary artery disease)    a. 01/2016: NSTEMI with cath showing 100% Prox Cx stenosis, 70% Prox LAD, 75% 1st Diag, and 60-70% RCA stenosis with medical therapy pursued as the LCx had collateral flow noted   COPD (chronic obstructive pulmonary disease) (HCC)    Dyspnea    Hypertension    Myocardial  infarction (HCC)    01/2016   Pneumonia    Prostate cancer [facility])    s/p prostatectomy   Sleep apnea    cpap   Tobacco use    Umbilical hernia    Urinary frequency     Family History  Problem Relation Age of Onset   Dementia Mother    Heart attack Father 37   CAD Father        s/p cabg    Past Surgical History:  Procedure Laterality Date   AMPUTATION Left 07/04/2022   Procedure: LEFT FOOT 5TH RAY AMPUTATION;  Surgeon: Nadara Mustard, MD;  Location: [facility];  Service: Orthopedics;  Laterality: Left;   bone graft right ankle   1972   CARDIAC CATHETERIZATION N/A 02/04/2016   Procedure: Left Heart Cath and Coronary Angiography;  Surgeon: Runell Gess, MD;  Location: [facility] INVASIVE CV LAB;  Service: Cardiovascular;  Laterality: N/A;   COLONOSCOPY WITH PROPOFOL N/A 09/19/2016   Procedure: COLONOSCOPY WITH PROPOFOL;  Surgeon: Jeani Hawking, MD;  [facility];  Service: Endoscopy;  Laterality: N/A;   COLONOSCOPY WITH PROPOFOL N/A 10/04/2021   Procedure: COLONOSCOPY WITH PROPOFOL;  Surgeon: Jeani Hawking, MD;  [facility];  Service: Gastroenterology;  Laterality: N/A;   HERNIA REPAIR     LYMPHADENECTOMY Bilateral 08/17/2014   Procedure: LYMPHADENECTOMY;  Surgeon: Heloise Purpura, MD;  [facility];  Service: Urology;  Laterality: Bilateral;   POLYPECTOMY  10/04/2021   Procedure: POLYPECTOMY;  Surgeon: Jeani Hawking, MD;  Location: [facility];  Service: Gastroenterology;;   right shoulder surgery   1992    ROBOT ASSISTED LAPAROSCOPIC RADICAL PROSTATECTOMY N/A 08/17/2014   Procedure: ROBOTIC ASSISTED LAPAROSCOPIC RADICAL PROSTATECTOMY LEVEL 3  AND UMBILICAL HERNIA REPAIR;  Surgeon: Heloise Purpura, MD;  Location: [facility];  Service: Urology;  Laterality: N/A;   Social History   Occupational History   Not on file  Tobacco Use   Smoking status: Every Day    Current packs/day: 1.00    Average packs/day: 1 pack/day for 47.0 years (47.0 ttl pk-yrs)    Types: Cigarettes    Smokeless tobacco: Never   Tobacco comments:    back to smoking a 1 ppd since 2018  Vaping Use   Vaping status: Never Used  Substance and Sexual Activity   Alcohol use: Yes    Alcohol/week: 0.0 standard drinks of alcohol    Comment: occasional    Drug use: No   Sexual activity: Yes

## 2022-10-09 ENCOUNTER — Encounter: Payer: Self-pay | Admitting: Orthopedic Surgery

## 2022-10-09 ENCOUNTER — Ambulatory Visit (INDEPENDENT_AMBULATORY_CARE_PROVIDER_SITE_OTHER): Payer: Medicare Other | Admitting: Orthopedic Surgery

## 2022-10-09 DIAGNOSIS — Z89422 Acquired absence of other left toe(s): Secondary | ICD-10-CM

## 2022-10-09 NOTE — Progress Notes (Signed)
Office Visit Note   Patient: John Griffin           Date of Birth: August 04, 1950           MRN: [id number] Visit Date: 10/09/2022              Requested by: Barbie Banner, MD [address] [place],  [place] [address] PCP: Barbie Banner, MD  Chief Complaint  Patient presents with   Left Foot - Routine Post Op     07/04/2022 left foot 5th ray amputation         HPI: Patient is a 73 year old gentleman who is 3 months status post left foot fifth ray amputation.  He is currently in a postoperative shoe.  Assessment & Plan: Visit Diagnoses:  1. History of complete ray amputation of fifth toe of left foot (HCC)     Plan: Patient may advance to regular shoes without restrictions.  Recommend use of his compression socks for the venous insufficiency.  Follow-Up Instructions: No follow-ups on file.   Ortho Exam  Patient is alert, oriented, no adenopathy, well-dressed, normal affect, normal respiratory effort. Examination patient does have venous stasis swelling of the left lower extremity he has a good pulse the incision is well-healed he is not wearing compression socks at this time.  Imaging: No results found. No images are attached to the encounter.  Labs: Lab Results  Component Value Date   HGBA1C 6.0 (H) 02/03/2016   HGBA1C 5.8 (H) 02/24/2015   REPTSTATUS 07/08/2018 FINAL 07/05/2018   GRAMSTAIN  07/05/2018    RARE WBC PRESENT, PREDOMINANTLY MONONUCLEAR FEW GRAM POSITIVE COCCI MODERATE GRAM NEGATIVE RODS    CULT  07/05/2018    MODERATE ALCALIGENES FAECALIS WITHIN MIXED GROWTH Performed at [facility], [address], [place], [place] [address]    Ritta Slot 07/05/2018     Lab Results  Component Value Date   ALBUMIN 3.9 07/25/2022   ALBUMIN 3.5 07/03/2022   ALBUMIN 3.4 (L) 07/02/2022    Lab Results  Component Value Date   MG 2.3 02/25/2015   No results found for: "VD25OH"  No results found for: "PREALBUMIN"     Latest Ref Rng & Units 07/05/2022    1:39 AM 07/04/2022    2:40 AM 07/02/2022    9:53 AM  CBC EXTENDED  WBC 4.0 - 10.5 K/uL 11.4  9.5  9.1   RBC 4.22 - 5.81 MIL/uL 4.45  4.66  4.98   Hemoglobin 13.0 - 17.0 g/dL 24.4  01.0  27.2   HCT 39.0 - 52.0 % 42.2  43.9  48.2   Platelets 150 - 400 K/uL 180  190  200   NEUT# 1.7 - 7.7 K/uL   6.0   Lymph# 0.7 - 4.0 K/uL   1.9      There is no height or weight on file to calculate BMI.  Orders:  No orders of the defined types were placed in this encounter.  No orders of the defined types were placed in this encounter.    Procedures: No procedures performed  Clinical Data: No additional findings.  ROS:  All other systems negative, except as noted in the HPI. Review of Systems  Objective: Vital Signs: There were no vitals taken for this visit.  Specialty Comments:  No specialty comments available.  PMFS History: Patient Active Problem List   Diagnosis Date Noted   Gangrene of toe (HCC) 07/02/2022   Venous ulcer of right lower extremity  with varicose veins (HCC) 07/02/2022   Thoracic aortic atherosclerosis (HCC) 11/27/2021   Elevated liver function tests 09/22/2018   Prediabetes 09/22/2018   CAD (coronary artery disease) 08/19/2016   Dyslipidemia, goal LDL below 70 02/29/2016   NSTEMI (non-ST elevation myocardial infarction) (HCC) 02/03/2016   Acute CHF (HCC) 02/03/2016   OSA (obstructive sleep apnea) 06/27/2015   COPD (chronic obstructive pulmonary disease) (HCC) 03/14/2015   Hypersomnia 03/14/2015   Exertional dyspnea 03/14/2015   Obesity 03/14/2015   Osteoarthritis 03/01/2015   Tobacco use    Essential hypertension    Prostate cancer (HCC) 08/17/2014   Past Medical History:  Diagnosis Date   Arthritis    hands  and shoulders   CAD (coronary artery disease)    a. 01/2016: NSTEMI with cath showing 100% Prox Cx stenosis, 70% Prox LAD, 75% 1st Diag, and 60-70% RCA stenosis with medical therapy pursued as the LCx had  collateral flow noted   COPD (chronic obstructive pulmonary disease) (HCC)    Dyspnea    Hypertension    Myocardial infarction (HCC)    01/2016   Pneumonia    Prostate cancer [facility])    s/p prostatectomy   Sleep apnea    cpap   Tobacco use    Umbilical hernia    Urinary frequency     Family History  Problem Relation Age of Onset   Dementia Mother    Heart attack Father 52   CAD Father        s/p cabg    Past Surgical History:  Procedure Laterality Date   AMPUTATION Left 07/04/2022   Procedure: LEFT FOOT 5TH RAY AMPUTATION;  Surgeon: Nadara Mustard, MD;  Location: [facility];  Service: Orthopedics;  Laterality: Left;   bone graft right ankle   1972   CARDIAC CATHETERIZATION N/A 02/04/2016   Procedure: Left Heart Cath and Coronary Angiography;  Surgeon: Runell Gess, MD;  Location: [facility] INVASIVE CV LAB;  Service: Cardiovascular;  Laterality: N/A;   COLONOSCOPY WITH PROPOFOL N/A 09/19/2016   Procedure: COLONOSCOPY WITH PROPOFOL;  Surgeon: Jeani Hawking, MD;  [facility];  Service: Endoscopy;  Laterality: N/A;   COLONOSCOPY WITH PROPOFOL N/A 10/04/2021   Procedure: COLONOSCOPY WITH PROPOFOL;  Surgeon: Jeani Hawking, MD;  [facility];  Service: Gastroenterology;  Laterality: N/A;   HERNIA REPAIR     LYMPHADENECTOMY Bilateral 08/17/2014   Procedure: LYMPHADENECTOMY;  Surgeon: Heloise Purpura, MD;  [facility];  Service: Urology;  Laterality: Bilateral;   POLYPECTOMY  10/04/2021   Procedure: POLYPECTOMY;  Surgeon: Jeani Hawking, MD;  Location: [facility];  Service: Gastroenterology;;   right shoulder surgery   1992    ROBOT ASSISTED LAPAROSCOPIC RADICAL PROSTATECTOMY N/A 08/17/2014   Procedure: ROBOTIC ASSISTED LAPAROSCOPIC RADICAL PROSTATECTOMY LEVEL 3  AND UMBILICAL HERNIA REPAIR;  Surgeon: Heloise Purpura, MD;  Location: [facility];  Service: Urology;  Laterality: N/A;   Social History   Occupational History   Not on file  Tobacco Use   Smoking status: Every Day     Current packs/day: 1.00    Average packs/day: 1 pack/day for 47.0 years (47.0 ttl pk-yrs)    Types: Cigarettes   Smokeless tobacco: Never   Tobacco comments:    back to smoking a 1 ppd since 2018  Vaping Use   Vaping status: Never Used  Substance and Sexual Activity   Alcohol use: Yes    Alcohol/week: 0.0 standard drinks of alcohol    Comment: occasional    Drug use:  No   Sexual activity: Yes

## 2022-10-29 LAB — HEPATIC FUNCTION PANEL
ALT: 66 [IU]/L — ABNORMAL HIGH (ref 0–44)
AST: 46 [IU]/L — ABNORMAL HIGH (ref 0–40)
Albumin: 4 g/dL (ref 3.8–4.8)
Alkaline Phosphatase: 167 [IU]/L — ABNORMAL HIGH (ref 44–121)
Bilirubin Total: 0.4 mg/dL (ref 0.0–1.2)
Bilirubin, Direct: 0.15 mg/dL (ref 0.00–0.40)
Total Protein: 6.8 g/dL (ref 6.0–8.5)

## 2022-11-17 ENCOUNTER — Telehealth: Payer: Self-pay | Admitting: Internal Medicine

## 2022-11-17 NOTE — Telephone Encounter (Signed)
Needs new patient approval

## 2022-11-20 NOTE — Telephone Encounter (Signed)
Appt scheduled/ patient aware.

## 2022-11-20 NOTE — Telephone Encounter (Signed)
Called patient left voicemail to call office to schedule new patient appointment.

## 2022-11-27 ENCOUNTER — Other Ambulatory Visit: Payer: Self-pay | Admitting: Cardiovascular Disease

## 2022-12-30 ENCOUNTER — Encounter: Payer: Self-pay | Admitting: Internal Medicine

## 2022-12-30 ENCOUNTER — Ambulatory Visit: Payer: Medicare Other | Admitting: Internal Medicine

## 2022-12-30 VITALS — BP 126/73 | HR 97 | Ht 76.0 in | Wt 288.8 lb

## 2022-12-30 DIAGNOSIS — R7401 Elevation of levels of liver transaminase levels: Secondary | ICD-10-CM

## 2022-12-30 DIAGNOSIS — J449 Chronic obstructive pulmonary disease, unspecified: Secondary | ICD-10-CM

## 2022-12-30 DIAGNOSIS — Z6835 Body mass index (BMI) 35.0-35.9, adult: Secondary | ICD-10-CM

## 2022-12-30 DIAGNOSIS — I10 Essential (primary) hypertension: Secondary | ICD-10-CM

## 2022-12-30 DIAGNOSIS — Z23 Encounter for immunization: Secondary | ICD-10-CM

## 2022-12-30 DIAGNOSIS — R7303 Prediabetes: Secondary | ICD-10-CM

## 2022-12-30 DIAGNOSIS — I2510 Atherosclerotic heart disease of native coronary artery without angina pectoris: Secondary | ICD-10-CM | POA: Diagnosis not present

## 2022-12-30 DIAGNOSIS — G4733 Obstructive sleep apnea (adult) (pediatric): Secondary | ICD-10-CM | POA: Diagnosis not present

## 2022-12-30 DIAGNOSIS — Z1159 Encounter for screening for other viral diseases: Secondary | ICD-10-CM

## 2022-12-30 DIAGNOSIS — R7989 Other specified abnormal findings of blood chemistry: Secondary | ICD-10-CM

## 2022-12-30 DIAGNOSIS — E785 Hyperlipidemia, unspecified: Secondary | ICD-10-CM

## 2022-12-30 DIAGNOSIS — Z720 Tobacco use: Secondary | ICD-10-CM

## 2022-12-30 DIAGNOSIS — C61 Malignant neoplasm of prostate: Secondary | ICD-10-CM

## 2022-12-30 DIAGNOSIS — E669 Obesity, unspecified: Secondary | ICD-10-CM

## 2022-12-30 MED ORDER — ALBUTEROL SULFATE HFA 108 (90 BASE) MCG/ACT IN AERS: 2.0000 | INHALATION_SPRAY | RESPIRATORY_TRACT | 4 refills | Status: AC | PRN

## 2022-12-30 NOTE — Assessment & Plan Note (Signed)
History of NSTEMI (2018).  Underwent LHC at that time, revealing nondominant LCx occlusion and 70% proximal LAD lesion.  Followed by cardiology (Dr. Allyson Sabal).  He remains on DAPT (ASA/Plavix) indefinitely and is additionally prescribed atorvastatin and metoprolol.  No medication changes are indicated today.

## 2022-12-30 NOTE — Assessment & Plan Note (Signed)
Adequately controlled on current antihypertensive regimen consisting of losartan 25 mg daily, Imdur 30 mg daily, and metoprolol tartrate 25 mg twice daily.

## 2022-12-30 NOTE — Assessment & Plan Note (Signed)
S/p prostatectomy in 2016.  Not actively followed by urology or oncology.  Denies recent urinary symptoms.

## 2022-12-30 NOTE — Assessment & Plan Note (Signed)
Lipid panel updated in July.  Total cholesterol 104 and LDL 55.  He is currently prescribed atorvastatin 80 mg daily.

## 2022-12-30 NOTE — Assessment & Plan Note (Signed)
He endorses a history of prediabetes.  Currently prescribed metformin 500 mg twice daily.  Repeat A1c ordered today.

## 2022-12-30 NOTE — Assessment & Plan Note (Signed)
He endorses current tobacco use, smoking 1 pack/day of cigarettes and has been smoking since 1972.  He is currently precontemplative with regards to cessation and declines further lung cancer screening.

## 2022-12-30 NOTE — Assessment & Plan Note (Signed)
Influenza vaccine administered today.

## 2022-12-30 NOTE — Assessment & Plan Note (Signed)
Mild AST/ALT elevation noted on labs dating back at least 3 years.  Suspect underlying fatty liver changes.  He has lost close to 30 pounds since labs were last updated.  Repeat labs ordered today.

## 2022-12-30 NOTE — Assessment & Plan Note (Signed)
He endorses nightly compliance with CPAP

## 2022-12-30 NOTE — Progress Notes (Signed)
New Patient Office Visit  Subjective    Patient ID: John Griffin, male    DOB: Feb 02, 1950  Age: 72 y.o. MRN: [id number]  CC:  Chief Complaint  Patient presents with   Establish Care   HPI John Griffin presents to establish care.  He is a 72 year old male who endorses a past medical history significant for CAD, HTN, prediabetes, COPD, OSA, history of prostate cancer, seasonal allergies, and HLD.  Previously followed by Dr. Andrey Campanile at [facility] family practice.  Mr. [facility] reports feeling well today.  He is asymptomatic and has no acute concerns to discuss aside from desiring to establish care.  He is a retired Social research officer, government.  He endorses current tobacco use, smoking 1 pack/day of cigarettes since 1972.  Denies alcohol and illicit drug use.  Family medical history significant for CAD, Alzheimer's dementia, and asthma.  Chronic medical conditions and outstanding preventative care items discussed today are individually addressed in A/P below.  Outpatient Encounter Medications as of 12/30/2022  Medication Sig   aspirin 81 MG chewable tablet Chew 1 tablet (81 mg total) by mouth daily.   atorvastatin (LIPITOR) 80 MG tablet TAKE 1 TABLET BY MOUTH EVERY DAY   cetirizine (ZYRTEC) 10 MG tablet Take 10 mg by mouth daily.   clopidogrel (PLAVIX) 75 MG tablet TAKE 1 TABLET BY MOUTH EVERY DAY   fluticasone (FLONASE) 50 MCG/ACT nasal spray Place 2 sprays into both nostrils daily as needed (nasal allergies).   furosemide (LASIX) 20 MG tablet TAKE 1 TABLET BY MOUTH EVERY DAY   isosorbide mononitrate (IMDUR) 30 MG 24 hr tablet TAKE 1 TABLET BY MOUTH EVERY DAY   KLOR-CON M10 10 MEQ tablet TAKE 1 TABLET BY MOUTH EVERY DAY   losartan (COZAAR) 25 MG tablet TAKE 1 TABLET (25 MG TOTAL) BY MOUTH DAILY.   metFORMIN (GLUCOPHAGE) 500 MG tablet Take 500 mg by mouth in the morning and at bedtime.   metoprolol tartrate (LOPRESSOR) 25 MG tablet TAKE 1 TABLET BY MOUTH TWICE A DAY   nitroGLYCERIN (NITROSTAT) 0.4  MG SL tablet Place 1 tablet (0.4 mg total) under the tongue every 5 (five) minutes x 3 doses as needed for chest pain.   [DISCONTINUED] albuterol (VENTOLIN HFA) 108 (90 Base) MCG/ACT inhaler Inhale 2 puffs into the lungs every 4 (four) hours as needed for wheezing or shortness of breath.   acetaminophen (TYLENOL) 500 MG tablet Take 1,000 mg by mouth every 6 (six) hours as needed for mild pain. Reported on 05/09/2015 (Patient not taking: Reported on 07/25/2022)   albuterol (VENTOLIN HFA) 108 (90 Base) MCG/ACT inhaler Inhale 2 puffs into the lungs every 4 (four) hours as needed for wheezing or shortness of breath.   [DISCONTINUED] Chlorpheniramine-DM (CORICIDIN HBP COUGH/COLD PO) Take 1 tablet by mouth in the morning and at bedtime. Sinus congestion. (Patient not taking: Reported on 12/30/2022)   No facility-administered encounter medications on file as of 12/30/2022.    Past Medical History:  Diagnosis Date   Arthritis    hands  and shoulders   CAD (coronary artery disease)    a. 01/2016: NSTEMI with cath showing 100% Prox Cx stenosis, 70% Prox LAD, 75% 1st Diag, and 60-70% RCA stenosis with medical therapy pursued as the LCx had collateral flow noted   COPD (chronic obstructive pulmonary disease) (HCC)    Dyspnea    Hypertension    Myocardial infarction (HCC)    01/2016   Pneumonia    Prostate cancer [facility])    s/p prostatectomy  Sleep apnea    cpap   Tobacco use    Umbilical hernia    Urinary frequency     Past Surgical History:  Procedure Laterality Date   AMPUTATION Left 07/04/2022   Procedure: LEFT FOOT 5TH RAY AMPUTATION;  Surgeon: Nadara Mustard, MD;  Location: [facility];  Service: Orthopedics;  Laterality: Left;   bone graft right ankle   1972   CARDIAC CATHETERIZATION N/A 02/04/2016   Procedure: Left Heart Cath and Coronary Angiography;  Surgeon: Runell Gess, MD;  Location: [facility] INVASIVE CV LAB;  Service: Cardiovascular;  Laterality: N/A;   COLONOSCOPY WITH PROPOFOL N/A 09/19/2016    Procedure: COLONOSCOPY WITH PROPOFOL;  Surgeon: Jeani Hawking, MD;  Location: [facility] ENDOSCOPY;  Service: Endoscopy;  Laterality: N/A;   COLONOSCOPY WITH PROPOFOL N/A 10/04/2021   Procedure: COLONOSCOPY WITH PROPOFOL;  Surgeon: Jeani Hawking, MD;  Location: [facility] ENDOSCOPY;  Service: Gastroenterology;  Laterality: N/A;   HERNIA REPAIR     LYMPHADENECTOMY Bilateral 08/17/2014   Procedure: LYMPHADENECTOMY;  Surgeon: Heloise Purpura, MD;  Location: [facility] ORS;  Service: Urology;  Laterality: Bilateral;   POLYPECTOMY  10/04/2021   Procedure: POLYPECTOMY;  Surgeon: Jeani Hawking, MD;  Location: [facility];  Service: Gastroenterology;;   right shoulder surgery   1992    ROBOT ASSISTED LAPAROSCOPIC RADICAL PROSTATECTOMY N/A 08/17/2014   Procedure: ROBOTIC ASSISTED LAPAROSCOPIC RADICAL PROSTATECTOMY LEVEL 3  AND UMBILICAL HERNIA REPAIR;  Surgeon: Heloise Purpura, MD;  Location: [facility] ORS;  Service: Urology;  Laterality: N/A;    Family History  Problem Relation Age of Onset   Dementia Mother    Heart attack Father 24   CAD Father        s/p cabg    Social History   Socioeconomic History   Marital status: Married    Spouse name: Not on file   Number of children: Not on file   Years of education: Not on file   Highest education level: Never attended school  Occupational History   Not on file  Tobacco Use   Smoking status: Every Day    Current packs/day: 1.00    Average packs/day: 1 pack/day for 47.0 years (47.0 ttl pk-yrs)    Types: Cigarettes   Smokeless tobacco: Never   Tobacco comments:    back to smoking a 1 ppd since 2018  Vaping Use   Vaping status: Never Used  Substance and Sexual Activity   Alcohol use: Yes    Alcohol/week: 0.0 standard drinks of alcohol    Comment: occasional    Drug use: No   Sexual activity: Yes  Other Topics Concern   Not on file  Social History Narrative   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 Allyson Sabal with  [facility]   Social Determinants of Health   Financial Resource Strain: Low Risk  (12/29/2022)   Overall Financial Resource Strain (CARDIA)    Difficulty of Paying Living Expenses: Not hard at all  Food Insecurity: No Food Insecurity (12/29/2022)   Hunger Vital Sign    Worried About Running Out of Food in the Last Year: Never true    Ran Out of Food in the Last Year: Never true  Transportation Needs: No Transportation Needs (12/29/2022)   PRAPARE - Administrator, Civil Service (Medical): No    Lack of Transportation (Non-Medical): No  Physical Activity: Unknown (12/29/2022)   Exercise Vital Sign    Days  of Exercise per Week: 0 days    Minutes of Exercise per Session: Not on file  Stress: No Stress Concern Present (12/29/2022)   [employer] of Occupational Health - Occupational Stress Questionnaire    Feeling of Stress : Not at all  Social Connections: Moderately Isolated (12/29/2022)   Social Connection and Isolation Panel [NHANES]    Frequency of Communication with Friends and Family: Twice a week    Frequency of Social Gatherings with Friends and Family: Once a week    Attends Religious Services: Never    Database administrator or Organizations: No    Attends Engineer, structural: Not on file    Marital Status: Married  Catering manager Violence: Not At Risk (07/02/2022)   Humiliation, Afraid, Rape, and Kick questionnaire    Fear of Current or Ex-Partner: No    Emotionally Abused: No    Physically Abused: No    Sexually Abused: No   Review of Systems  Constitutional:  Negative for chills and fever.  HENT:  Negative for sore throat.   Respiratory:  Negative for cough and shortness of breath.   Cardiovascular:  Negative for chest pain, palpitations and leg swelling.  Gastrointestinal:  Negative for abdominal pain, blood in stool, constipation, diarrhea, nausea and vomiting.  Genitourinary:  Negative for dysuria and hematuria.  Musculoskeletal:   Negative for myalgias.  Skin:  Negative for itching and rash.  Neurological:  Negative for dizziness and headaches.  Psychiatric/Behavioral:  Negative for depression and suicidal ideas.    Objective    BP 126/73 (BP Location: Left Arm, Patient Position: Sitting, Cuff Size: Large)   Pulse 97   Ht 6\' 4"  (1.93 m)   Wt 288 lb 12.8 oz (131 kg)   SpO2 92%   BMI 35.15 kg/m   Physical Exam Vitals reviewed.  Constitutional:      General: He is not in acute distress.    Appearance: Normal appearance. He is obese. He is not ill-appearing.  HENT:     Head: Normocephalic and atraumatic.     Right Ear: External ear normal.     Left Ear: External ear normal.     Nose: Nose normal. No congestion or rhinorrhea.     Mouth/Throat:     Mouth: Mucous membranes are moist.     Pharynx: Oropharynx is clear.  Eyes:     General: No scleral icterus.    Extraocular Movements: Extraocular movements intact.     Conjunctiva/sclera: Conjunctivae normal.     Pupils: Pupils are equal, round, and reactive to light.  Cardiovascular:     Rate and Rhythm: Normal rate and regular rhythm.     Pulses: Normal pulses.     Heart sounds: Normal heart sounds. No murmur heard. Pulmonary:     Effort: Pulmonary effort is normal.     Breath sounds: Normal breath sounds. No wheezing, rhonchi or rales.  Abdominal:     General: Abdomen is flat. Bowel sounds are normal. There is no distension.     Palpations: Abdomen is soft.     Tenderness: There is no abdominal tenderness.  Musculoskeletal:        General: No swelling or deformity. Normal range of motion.     Cervical back: Normal range of motion.  Skin:    General: Skin is warm and dry.     Capillary Refill: Capillary refill takes less than 2 seconds.  Neurological:     General: No focal deficit present.  Mental Status: He is alert and oriented to person, place, and time.     Motor: No weakness.     Gait: Gait abnormal (ambulates with a walker).  Psychiatric:         Mood and Affect: Mood normal.        Behavior: Behavior normal.        Thought Content: Thought content normal.   Last CBC Lab Results  Component Value Date   WBC 11.4 (H) 07/05/2022   HGB 14.0 07/05/2022   HCT 42.2 07/05/2022   MCV 94.8 07/05/2022   MCH 31.5 07/05/2022   RDW 13.0 07/05/2022   PLT 180 07/05/2022   Last metabolic panel Lab Results  Component Value Date   GLUCOSE 122 (H) 07/04/2022   NA 139 07/04/2022   K 3.6 07/04/2022   CL 103 07/04/2022   CO2 27 07/04/2022   BUN 15 07/04/2022   CREATININE 0.75 07/04/2022   GFRNONAA >60 07/04/2022   CALCIUM 8.8 (L) 07/04/2022   PROT 6.8 10/28/2022   ALBUMIN 4.0 10/28/2022   BILITOT 0.4 10/28/2022   ALKPHOS 167 (H) 10/28/2022   AST 46 (H) 10/28/2022   ALT 66 (H) 10/28/2022   ANIONGAP 9 07/04/2022   Last lipids Lab Results  Component Value Date   CHOL 104 07/25/2022   HDL 29 (L) 07/25/2022   LDLCALC 55 07/25/2022   TRIG 105 07/25/2022   CHOLHDL 3.6 07/25/2022   Last hemoglobin A1c Lab Results  Component Value Date   HGBA1C 6.0 (H) 02/03/2016   Last thyroid functions Lab Results  Component Value Date   TSH 3.131 02/03/2016   Assessment & Plan:   Problem List Items Addressed This Visit       Essential hypertension - Primary    Adequately controlled on current antihypertensive regimen consisting of losartan 25 mg daily, Imdur 30 mg daily, and metoprolol tartrate 25 mg twice daily.      CAD (coronary artery disease)    History of NSTEMI (2018).  Underwent LHC at that time, revealing nondominant LCx occlusion and 70% proximal LAD lesion.  Followed by cardiology (Dr. Allyson Sabal).  He remains on DAPT (ASA/Plavix) indefinitely and is additionally prescribed atorvastatin and metoprolol.  No medication changes are indicated today.      COPD (chronic obstructive pulmonary disease) (HCC)    Previously followed by pulmonology.  Currently prescribed an albuterol inhaler for rescue use and has previously declined  to start on a maintenance regimen.  Asymptomatic currently.  Pulmonary exam is unremarkable.  He declines a referral for lung cancer screening.  Precontemplative with regards to smoking cessation.      OSA (obstructive sleep apnea)    He endorses nightly compliance with CPAP      Prostate cancer (HCC)    S/p prostatectomy in 2016.  Not actively followed by urology or oncology.  Denies recent urinary symptoms.      Tobacco use (Chronic)    He endorses current tobacco use, smoking 1 pack/day of cigarettes and has been smoking since 1972.  He is currently precontemplative with regards to cessation and declines further lung cancer screening.      Dyslipidemia, goal LDL below 70    Lipid panel updated in July.  Total cholesterol 104 and LDL 55.  He is currently prescribed atorvastatin 80 mg daily.      Prediabetes    He endorses a history of prediabetes.  Currently prescribed metformin 500 mg twice daily.  Repeat A1c ordered today.  Need for influenza vaccination    Influenza vaccine administered today      Elevated liver transaminase level    Mild AST/ALT elevation noted on labs dating back at least 3 years.  Suspect underlying fatty liver changes.  He has lost close to 30 pounds since labs were last updated.  Repeat labs ordered today.      Return in about 6 months (around 06/30/2023).   Billie Lade, MD

## 2022-12-30 NOTE — Patient Instructions (Signed)
It was a pleasure to see you today.  Thank you for giving [place] the opportunity to be involved in your care.  Below is a brief recap of your visit and next steps.  We will plan to see you again in 6 months.  Summary You have established care today We will check basic labs No medication changes were made. Albuterol refilled Follow up in 6 months

## 2022-12-30 NOTE — Assessment & Plan Note (Signed)
Previously followed by pulmonology.  Currently prescribed an albuterol inhaler for rescue use and has previously declined to start on a maintenance regimen.  Asymptomatic currently.  Pulmonary exam is unremarkable.  He declines a referral for lung cancer screening.  Precontemplative with regards to smoking cessation.

## 2023-01-01 LAB — CBC WITH DIFFERENTIAL/PLATELET
Basophils Absolute: 0.1 10*3/uL (ref 0.0–0.2)
Basos: 1 %
EOS (ABSOLUTE): 0.3 10*3/uL (ref 0.0–0.4)
Eos: 4 %
Hematocrit: 45.1 % (ref 37.5–51.0)
Hemoglobin: 14.8 g/dL (ref 13.0–17.7)
Immature Grans (Abs): 0 10*3/uL (ref 0.0–0.1)
Immature Granulocytes: 0 %
Lymphocytes Absolute: 1.9 10*3/uL (ref 0.7–3.1)
Lymphs: 26 %
MCH: 30.1 pg (ref 26.6–33.0)
MCHC: 32.8 g/dL (ref 31.5–35.7)
MCV: 92 fL (ref 79–97)
Monocytes Absolute: 0.7 10*3/uL (ref 0.1–0.9)
Monocytes: 9 %
Neutrophils Absolute: 4.4 10*3/uL (ref 1.4–7.0)
Neutrophils: 60 %
Platelets: 232 10*3/uL (ref 150–450)
RBC: 4.92 x10E6/uL (ref 4.14–5.80)
RDW: 14.2 % (ref 11.6–15.4)
WBC: 7.3 10*3/uL (ref 3.4–10.8)

## 2023-01-01 LAB — CMP14+EGFR
ALT: 68 [IU]/L — ABNORMAL HIGH (ref 0–44)
AST: 55 [IU]/L — ABNORMAL HIGH (ref 0–40)
Albumin: 4 g/dL (ref 3.8–4.8)
Alkaline Phosphatase: 188 [IU]/L — ABNORMAL HIGH (ref 44–121)
BUN/Creatinine Ratio: 15 (ref 10–24)
BUN: 12 mg/dL (ref 8–27)
Bilirubin Total: 0.6 mg/dL (ref 0.0–1.2)
CO2: 23 mmol/L (ref 20–29)
Calcium: 9.8 mg/dL (ref 8.6–10.2)
Chloride: 101 mmol/L (ref 96–106)
Creatinine, Ser: 0.78 mg/dL (ref 0.76–1.27)
Globulin, Total: 3.2 g/dL (ref 1.5–4.5)
Glucose: 117 mg/dL — ABNORMAL HIGH (ref 70–99)
Potassium: 4.3 mmol/L (ref 3.5–5.2)
Sodium: 140 mmol/L (ref 134–144)
Total Protein: 7.2 g/dL (ref 6.0–8.5)
eGFR: 94 mL/min/{1.73_m2} (ref >59)

## 2023-01-01 LAB — TSH+FREE T4
Free T4: 1.11 ng/dL (ref 0.82–1.77)
TSH: 4.25 u[IU]/mL (ref 0.450–4.500)

## 2023-01-01 LAB — HCV INTERPRETATION

## 2023-01-01 LAB — LIPID PANEL
Chol/HDL Ratio: 4.1 {ratio} (ref 0.0–5.0)
Cholesterol, Total: 115 mg/dL (ref 100–199)
HDL: 28 mg/dL — ABNORMAL LOW (ref >39)
LDL Chol Calc (NIH): 62 mg/dL (ref 0–99)
Triglycerides: 144 mg/dL (ref 0–149)
VLDL Cholesterol Cal: 25 mg/dL (ref 5–40)

## 2023-01-01 LAB — VITAMIN D 25 HYDROXY (VIT D DEFICIENCY, FRACTURES): Vit D, 25-Hydroxy: 61.5 ng/mL (ref 30.0–100.0)

## 2023-01-01 LAB — HCV AB W REFLEX TO QUANT PCR: HCV Ab: NONREACTIVE

## 2023-01-01 LAB — B12 AND FOLATE PANEL
Folate: 5.5 ng/mL (ref >3.0)
Vitamin B-12: 746 pg/mL (ref 232–1245)

## 2023-01-01 LAB — HEMOGLOBIN A1C
Est. average glucose Bld gHb Est-mCnc: 126 mg/dL
Hgb A1c MFr Bld: 6 % — ABNORMAL HIGH (ref 4.8–5.6)

## 2023-01-08 ENCOUNTER — Ambulatory Visit: Payer: Medicare Other | Admitting: Orthopedic Surgery

## 2023-01-08 DIAGNOSIS — Z89422 Acquired absence of other left toe(s): Secondary | ICD-10-CM

## 2023-01-11 ENCOUNTER — Encounter: Payer: Self-pay | Admitting: Orthopedic Surgery

## 2023-01-11 NOTE — Progress Notes (Signed)
Office Visit Note   Patient: John Griffin           Date of Birth: [birth year]           MRN: [id number] Visit Date: 01/08/2023              Requested by: Barbie Banner, MD [address] [place],  [place] [address] PCP: Billie Lade, MD  Chief Complaint  Patient presents with   Left Foot - Follow-up    07/04/2022 left foot 5th ray amputation      HPI: Patient is a 72 year old gentleman who is 6 months status post left foot fifth ray amputation he is in regular shoewear.  He does wear compression socks.  He states he is recently lost 30 pounds.  Patient sustained a recent laceration to his left leg.  Assessment & Plan: Visit Diagnoses:  1. History of complete ray amputation of fifth toe of left foot (HCC)     Plan: Recommended continue with the compression socks.  The left foot fifth ray amputation is well-healed.  Follow-Up Instructions: Return in about 4 weeks (around 02/05/2023).   Ortho Exam  Patient is alert, oriented, no adenopathy, well-dressed, normal affect, normal respiratory effort. Examination patient has a traumatic laceration to the left tibia.  There is no exposed bone or tendon.  No cellulitis no signs of infection.  The ray amputation is well-healed.  Imaging: No results found. No images are attached to the encounter.  Labs: Lab Results  Component Value Date   HGBA1C 6.0 (H) 12/30/2022   HGBA1C 6.0 (H) 02/03/2016   HGBA1C 5.8 (H) 02/24/2015   REPTSTATUS 07/08/2018 FINAL 07/05/2018   GRAMSTAIN  07/05/2018    RARE WBC PRESENT, PREDOMINANTLY MONONUCLEAR FEW GRAM POSITIVE COCCI MODERATE GRAM NEGATIVE RODS    CULT  07/05/2018    MODERATE ALCALIGENES FAECALIS WITHIN MIXED GROWTH Performed at [facility], [address], [place], [place] [address]    Ritta Slot 07/05/2018     Lab Results  Component Value Date   ALBUMIN 4.0 12/30/2022   ALBUMIN 4.0 10/28/2022   ALBUMIN 3.9 07/25/2022    Lab Results   Component Value Date   MG 2.3 02/25/2015   Lab Results  Component Value Date   VD25OH 61.5 12/30/2022    No results found for: "PREALBUMIN"    Latest Ref Rng & Units 12/30/2022    9:58 AM 07/05/2022    1:39 AM 07/04/2022    2:40 AM  CBC EXTENDED  WBC 3.4 - 10.8 x10E3/uL 7.3  11.4  9.5   RBC 4.14 - 5.80 x10E6/uL 4.92  4.45  4.66   Hemoglobin 13.0 - 17.7 g/dL 46.2  70.3  50.0   HCT 37.5 - 51.0 % 45.1  42.2  43.9   Platelets 150 - 450 x10E3/uL 232  180  190   NEUT# 1.4 - 7.0 x10E3/uL 4.4     Lymph# 0.7 - 3.1 x10E3/uL 1.9        There is no height or weight on file to calculate BMI.  Orders:  No orders of the defined types were placed in this encounter.  No orders of the defined types were placed in this encounter.    Procedures: No procedures performed  Clinical Data: No additional findings.  ROS:  All other systems negative, except as noted in the HPI. Review of Systems  Objective: Vital Signs: There were no vitals taken for this visit.  Specialty Comments:  No specialty  comments available.  PMFS History: Patient Active Problem List   Diagnosis Date Noted   Need for influenza vaccination 12/30/2022   Elevated liver transaminase level 12/30/2022   Gangrene of toe (HCC) 07/02/2022   Venous ulcer of right lower extremity with varicose veins (HCC) 07/02/2022   Thoracic aortic atherosclerosis (HCC) 11/27/2021   Elevated liver function tests 09/22/2018   Prediabetes 09/22/2018   CAD (coronary artery disease) 08/19/2016   Dyslipidemia, goal LDL below 70 02/29/2016   NSTEMI (non-ST elevation myocardial infarction) (HCC) 02/03/2016   Acute CHF (HCC) 02/03/2016   OSA (obstructive sleep apnea) 06/27/2015   COPD (chronic obstructive pulmonary disease) (HCC) 03/14/2015   Hypersomnia 03/14/2015   Exertional dyspnea 03/14/2015   Obesity 03/14/2015   Osteoarthritis 03/01/2015   Tobacco use    Essential hypertension    Prostate cancer (HCC) 08/17/2014   Past  Medical History:  Diagnosis Date   Arthritis    hands  and shoulders   CAD (coronary artery disease)    a. 01/2016: NSTEMI with cath showing 100% Prox Cx stenosis, 70% Prox LAD, 75% 1st Diag, and 60-70% RCA stenosis with medical therapy pursued as the LCx had collateral flow noted   COPD (chronic obstructive pulmonary disease) (HCC)    Dyspnea    Hypertension    Myocardial infarction (HCC)    01/2016   Pneumonia    Prostate cancer [facility])    s/p prostatectomy   Sleep apnea    cpap   Tobacco use    Umbilical hernia    Urinary frequency     Family History  Problem Relation Age of Onset   Dementia Mother    Heart attack Father 59   CAD Father        s/p cabg    Past Surgical History:  Procedure Laterality Date   AMPUTATION Left 07/04/2022   Procedure: LEFT FOOT 5TH RAY AMPUTATION;  Surgeon: Nadara Mustard, MD;  Location: [facility] OR;  Service: Orthopedics;  Laterality: Left;   bone graft right ankle   1972   CARDIAC CATHETERIZATION N/A 02/04/2016   Procedure: Left Heart Cath and Coronary Angiography;  Surgeon: Runell Gess, MD;  Location: [facility] INVASIVE CV LAB;  Service: Cardiovascular;  Laterality: N/A;   COLONOSCOPY WITH PROPOFOL N/A 09/19/2016   Procedure: COLONOSCOPY WITH PROPOFOL;  Surgeon: Jeani Hawking, MD;  Location: [facility];  Service: Endoscopy;  Laterality: N/A;   COLONOSCOPY WITH PROPOFOL N/A 10/04/2021   Procedure: COLONOSCOPY WITH PROPOFOL;  Surgeon: Jeani Hawking, MD;  Location: [facility];  Service: Gastroenterology;  Laterality: N/A;   HERNIA REPAIR     LYMPHADENECTOMY Bilateral 08/17/2014   Procedure: LYMPHADENECTOMY;  Surgeon: Heloise Purpura, MD;  Location: [facility]S;  Service: Urology;  Laterality: Bilateral;   POLYPECTOMY  10/04/2021   Procedure: POLYPECTOMY;  Surgeon: Jeani Hawking, MD;  Location: [facility];  Service: Gastroenterology;;   right shoulder surgery   1992    ROBOT ASSISTED LAPAROSCOPIC RADICAL PROSTATECTOMY N/A 08/17/2014   Procedure: ROBOTIC  ASSISTED LAPAROSCOPIC RADICAL PROSTATECTOMY LEVEL 3  AND UMBILICAL HERNIA REPAIR;  Surgeon: Heloise Purpura, MD;  Location: [facility];  Service: Urology;  Laterality: N/A;   Social History   Occupational History   Not on file  Tobacco Use   Smoking status: Every Day    Current packs/day: 1.00    Average packs/day: 1 pack/day for 47.0 years (47.0 ttl pk-yrs)    Types: Cigarettes   Smokeless tobacco: Never   Tobacco comments:    back to smoking  a 1 ppd since 2018  Vaping Use   Vaping status: Never Used  Substance and Sexual Activity   Alcohol use: Yes    Alcohol/week: 0.0 standard drinks of alcohol    Comment: occasional    Drug use: No   Sexual activity: Yes

## 2023-02-23 ENCOUNTER — Other Ambulatory Visit: Payer: Self-pay | Admitting: Internal Medicine

## 2023-02-23 MED ORDER — METFORMIN HCL 500 MG PO TABS: 500.0000 mg | ORAL_TABLET | Freq: Two times a day (BID) | ORAL | 0 refills | Status: DC

## 2023-03-07 ENCOUNTER — Other Ambulatory Visit: Payer: Self-pay | Admitting: Cardiovascular Disease

## 2023-03-10 ENCOUNTER — Other Ambulatory Visit: Payer: Self-pay

## 2023-03-10 MED ORDER — LOSARTAN POTASSIUM 25 MG PO TABS: 25.0000 mg | ORAL_TABLET | Freq: Every day | ORAL | 1 refills | Status: DC

## 2023-04-01 ENCOUNTER — Ambulatory Visit: Payer: Medicare Other

## 2023-04-12 ENCOUNTER — Other Ambulatory Visit: Payer: Self-pay | Admitting: Internal Medicine

## 2023-04-13 MED ORDER — METFORMIN HCL 500 MG PO TABS: 500.0000 mg | ORAL_TABLET | Freq: Two times a day (BID) | ORAL | 0 refills | Status: DC

## 2023-05-06 ENCOUNTER — Other Ambulatory Visit: Payer: Self-pay | Admitting: Internal Medicine

## 2023-05-20 ENCOUNTER — Other Ambulatory Visit: Payer: Self-pay | Admitting: Cardiovascular Disease

## 2023-06-11 ENCOUNTER — Encounter: Payer: Self-pay | Admitting: Cardiovascular Disease

## 2023-06-14 ENCOUNTER — Other Ambulatory Visit: Payer: Self-pay | Admitting: Cardiovascular Disease

## 2023-06-30 ENCOUNTER — Ambulatory Visit (INDEPENDENT_AMBULATORY_CARE_PROVIDER_SITE_OTHER): Payer: Medicare Other | Admitting: Internal Medicine

## 2023-06-30 ENCOUNTER — Encounter: Payer: Self-pay | Admitting: Internal Medicine

## 2023-06-30 VITALS — BP 136/80 | HR 66 | Ht 76.0 in | Wt 312.6 lb

## 2023-06-30 DIAGNOSIS — R7401 Elevation of levels of liver transaminase levels: Secondary | ICD-10-CM | POA: Diagnosis not present

## 2023-06-30 DIAGNOSIS — R7989 Other specified abnormal findings of blood chemistry: Secondary | ICD-10-CM

## 2023-06-30 DIAGNOSIS — G4733 Obstructive sleep apnea (adult) (pediatric): Secondary | ICD-10-CM | POA: Diagnosis not present

## 2023-06-30 DIAGNOSIS — E66812 Obesity, class 2: Secondary | ICD-10-CM | POA: Diagnosis not present

## 2023-06-30 DIAGNOSIS — N200 Calculus of kidney: Secondary | ICD-10-CM | POA: Diagnosis not present

## 2023-06-30 DIAGNOSIS — R3916 Straining to void: Secondary | ICD-10-CM

## 2023-06-30 DIAGNOSIS — I10 Essential (primary) hypertension: Secondary | ICD-10-CM

## 2023-06-30 DIAGNOSIS — Z720 Tobacco use: Secondary | ICD-10-CM

## 2023-06-30 DIAGNOSIS — R7303 Prediabetes: Secondary | ICD-10-CM

## 2023-06-30 MED ORDER — TAMSULOSIN HCL 0.4 MG PO CAPS: 0.4000 mg | ORAL_CAPSULE | Freq: Every day | ORAL | 0 refills | Status: DC

## 2023-06-30 MED ORDER — TIRZEPATIDE-WEIGHT MANAGEMENT 2.5 MG/0.5ML ~~LOC~~ SOLN: 2.5000 mg | SUBCUTANEOUS | 0 refills | Status: DC

## 2023-06-30 NOTE — Assessment & Plan Note (Signed)
 Chronic, stable.  Attributed to underlying hepatic steatosis.  We again reviewed that weight loss is the best treatment.  He continues to follow a low-carb, sugar-free diet but has gained weight since his last appointment.  Zepbound prescribed today as otherwise documented.  Repeat CMP ordered.

## 2023-06-30 NOTE — Patient Instructions (Signed)
 It was a pleasure to see you today.  Thank you for giving us  the opportunity to be involved in your care.  Below is a brief recap of your visit and next steps.  We will plan to see you again in 3 months.  Summary Add Zepbound for weight loss and sleep apnea Add Flomax to help with urination Check liver enzymes Follow up in 3 months

## 2023-06-30 NOTE — Assessment & Plan Note (Signed)
 A1c 6.0 on labs from December 2024.  He continues to take metformin  500 mg twice daily.  Zepbound prescribed today as otherwise documented.

## 2023-06-30 NOTE — Assessment & Plan Note (Signed)
 Adequately controlled on current antihypertensive regimen.

## 2023-06-30 NOTE — Assessment & Plan Note (Signed)
 He continues to 1 pack/day of cigarettes and remains precontemplative with regards to cessation.  He is due for lung cancer screening, which we reviewed again today, but he again declines a referral to the lung cancer screening program.

## 2023-06-30 NOTE — Assessment & Plan Note (Signed)
 Current weight 312 pounds.  BMI 38.  He has gained 24 pounds since his last appointment despite adhering to a low-carb, sugar-free diet.  He is interested in medication options for weight loss.  Known history of severe obstructive sleep apnea.  He endorses nightly compliance with CPAP.  Through shared decision making, Zepbound 2.5 mg weekly has been prescribed today.  Lifestyle modifications aimed at weight loss were reinforced.  Follow-up in 3 months for weight management.

## 2023-06-30 NOTE — Progress Notes (Signed)
 Established Patient Office Visit  Subjective   Patient ID: John Griffin, male    DOB: 13-Feb-1950  Age: 73 y.o. MRN: [id number]  Chief Complaint  Patient presents with   Hypertension    Six month follow up    Nephrolithiasis    Patient states he has had kidney stones for over a week, has been able to pass all but one   Obesity    Patient would like an appetite suppressor    John Griffin returns to care today for routine follow-up.  He was last evaluated by me in December 2024 as a new patient presenting to establish care.  No medication changes were made at that time, repeat labs ordered, and 16-month follow-up arranged.  In the interim he has been seen by orthopedic surgery and optometry.  There have otherwise been no acute interval events.  John Griffin reports feeling fairly well today.  He endorses straining with urination, which she attributes to kidney stones.  Denies flank pain and hematuria.  His additional concern is wanting to discuss medication options for weight loss.  He is following a low-carb, sugar-free diet but has gained weight since his last appointment.  Past Medical History:  Diagnosis Date   Arthritis    hands  and shoulders   CAD (coronary artery disease)    a. 01/2016: NSTEMI with cath showing 100% Prox Cx stenosis, 70% Prox LAD, 75% 1st Diag, and 60-70% RCA stenosis with medical therapy pursued as the LCx had collateral flow noted   Cataract    COPD (chronic obstructive pulmonary disease) (HCC)    Dyspnea    Hypertension    Myocardial infarction (HCC)    01/2016   Pneumonia    Prostate cancer [facility])    s/p prostatectomy   Sleep apnea    cpap   Tobacco use    Umbilical hernia    Urinary frequency    Past Surgical History:  Procedure Laterality Date   AMPUTATION Left 07/04/2022   Procedure: LEFT FOOT 5TH RAY AMPUTATION;  Surgeon: Timothy Ford, MD;  Location: [facility] OR;  Service: Orthopedics;  Laterality: Left;   bone graft right ankle   1972   CARDIAC  CATHETERIZATION N/A 02/04/2016   Procedure: Left Heart Cath and Coronary Angiography;  Surgeon: Avanell Leigh, MD;  Location: [facility] INVASIVE CV LAB;  Service: Cardiovascular;  Laterality: N/A;   COLONOSCOPY WITH PROPOFOL  N/A 09/19/2016   Procedure: COLONOSCOPY WITH PROPOFOL ;  Surgeon: Alvis Jourdain, MD;  Location: [facility] ENDOSCOPY;  Service: Endoscopy;  Laterality: N/A;   COLONOSCOPY WITH PROPOFOL  N/A 10/04/2021   Procedure: COLONOSCOPY WITH PROPOFOL ;  Surgeon: Alvis Jourdain, MD;  Location: [facility] ENDOSCOPY;  Service: Gastroenterology;  Laterality: N/A;   FRACTURE SURGERY     HERNIA REPAIR     LYMPHADENECTOMY Bilateral 08/17/2014   Procedure: LYMPHADENECTOMY;  Surgeon: Florencio Hunting, MD;  Location: [facility] ORS;  Service: Urology;  Laterality: Bilateral;   POLYPECTOMY  10/04/2021   Procedure: POLYPECTOMY;  Surgeon: Alvis Jourdain, MD;  Location: [facility];  Service: Gastroenterology;;   right shoulder surgery   1992   ROBOT ASSISTED LAPAROSCOPIC RADICAL PROSTATECTOMY N/A 08/17/2014   Procedure: ROBOTIC ASSISTED LAPAROSCOPIC RADICAL PROSTATECTOMY LEVEL 3  AND UMBILICAL HERNIA REPAIR;  Surgeon: Florencio Hunting, MD;  Location: [facility] ORS;  Service: Urology;  Laterality: N/A;   Social History   Tobacco Use   Smoking status: Every Day    Current packs/day: 1.00    Average packs/day: 1 pack/day for 52.0 years (52.0 ttl  pk-yrs)    Types: Cigarettes   Smokeless tobacco: Never   Tobacco comments:    back to smoking a 1 ppd since 2018  Vaping Use   Vaping status: Never Used  Substance Use Topics   Alcohol use: Not Currently    Comment: occasional    Drug use: No   Family History  Problem Relation Age of Onset   Dementia Mother    Heart attack Father 66   CAD Father        s/p cabg   Allergies  Allergen Reactions   Lisinopril  Cough   Review of Systems  Constitutional:  Negative for chills and fever.  HENT:  Negative for sore throat.   Respiratory:  Negative for cough and shortness of breath.    Cardiovascular:  Negative for chest pain, palpitations and leg swelling.  Gastrointestinal:  Negative for abdominal pain, blood in stool, constipation, diarrhea, nausea and vomiting.  Genitourinary:  Negative for dysuria and hematuria.       Straining with urination  Musculoskeletal:  Negative for myalgias.  Skin:  Negative for itching and rash.  Neurological:  Negative for dizziness and headaches.  Psychiatric/Behavioral:  Negative for depression and suicidal ideas.      Objective:     BP 136/80   Pulse 66   Ht 6\' 4"  (1.93 m)   Wt (!) 312 lb 9.6 oz (141.8 kg)   SpO2 98%   BMI 38.05 kg/m  BP Readings from Last 3 Encounters:  06/30/23 136/80  12/30/22 126/73  07/25/22 118/73   Physical Exam Vitals reviewed.  Constitutional:      General: He is not in acute distress.    Appearance: Normal appearance. He is obese. He is not ill-appearing.  HENT:     Head: Normocephalic and atraumatic.     Right Ear: External ear normal.     Left Ear: External ear normal.     Nose: Nose normal. No congestion or rhinorrhea.     Mouth/Throat:     Mouth: Mucous membranes are moist.     Pharynx: Oropharynx is clear.  Eyes:     General: No scleral icterus.    Extraocular Movements: Extraocular movements intact.     Conjunctiva/sclera: Conjunctivae normal.     Pupils: Pupils are equal, round, and reactive to light.  Cardiovascular:     Rate and Rhythm: Normal rate and regular rhythm.     Pulses: Normal pulses.     Heart sounds: Normal heart sounds. No murmur heard. Pulmonary:     Effort: Pulmonary effort is normal.     Breath sounds: Normal breath sounds. No wheezing, rhonchi or rales.  Abdominal:     General: Abdomen is flat. Bowel sounds are normal. There is no distension.     Palpations: Abdomen is soft.     Tenderness: There is no abdominal tenderness.  Musculoskeletal:        General: No swelling or deformity. Normal range of motion.     Cervical back: Normal range of motion.   Skin:    General: Skin is warm and dry.     Capillary Refill: Capillary refill takes less than 2 seconds.  Neurological:     General: No focal deficit present.     Mental Status: He is alert and oriented to person, place, and time.     Motor: No weakness.     Gait: Gait abnormal (ambulates with a walker).  Psychiatric:        Mood and Affect:  Mood normal.        Behavior: Behavior normal.        Thought Content: Thought content normal.   Last CBC Lab Results  Component Value Date   WBC 7.3 12/30/2022   HGB 14.8 12/30/2022   HCT 45.1 12/30/2022   MCV 92 12/30/2022   MCH 30.1 12/30/2022   RDW 14.2 12/30/2022   PLT 232 12/30/2022   Last metabolic panel Lab Results  Component Value Date   GLUCOSE 117 (H) 12/30/2022   NA 140 12/30/2022   K 4.3 12/30/2022   CL 101 12/30/2022   CO2 23 12/30/2022   BUN 12 12/30/2022   CREATININE 0.78 12/30/2022   EGFR 94 12/30/2022   CALCIUM  9.8 12/30/2022   PROT 7.2 12/30/2022   ALBUMIN 4.0 12/30/2022   LABGLOB 3.2 12/30/2022   BILITOT 0.6 12/30/2022   ALKPHOS 188 (H) 12/30/2022   AST 55 (H) 12/30/2022   ALT 68 (H) 12/30/2022   ANIONGAP 9 07/04/2022   Last lipids Lab Results  Component Value Date   CHOL 115 12/30/2022   HDL 28 (L) 12/30/2022   LDLCALC 62 12/30/2022   TRIG 144 12/30/2022   CHOLHDL 4.1 12/30/2022   Last hemoglobin A1c Lab Results  Component Value Date   HGBA1C 6.0 (H) 12/30/2022   Last thyroid functions Lab Results  Component Value Date   TSH 4.250 12/30/2022   Last vitamin D  Lab Results  Component Value Date   VD25OH 61.5 12/30/2022   Last vitamin B12 and Folate Lab Results  Component Value Date   VITAMINB12 746 12/30/2022   FOLATE 5.5 12/30/2022     Assessment & Plan:   Problem List Items Addressed This Visit       Essential hypertension   Adequately controlled on current antihypertensive regimen      OSA (obstructive sleep apnea)   Severe OSA with AHI 41.2 on sleep study from April  2017.  He endorses nightly compliance with CPAP.  As otherwise documented, I have prescribed Zepbound today in the setting of obesity, prediabetes, and severe obstructive sleep apnea.        Tobacco use (Chronic)   He continues to 1 pack/day of cigarettes and remains precontemplative with regards to cessation.  He is due for lung cancer screening, which we reviewed again today, but he again declines a referral to the lung cancer screening program.      Obesity   Current weight 312 pounds.  BMI 38.  He has gained 24 pounds since his last appointment despite adhering to a low-carb, sugar-free diet.  He is interested in medication options for weight loss.  Known history of severe obstructive sleep apnea.  He endorses nightly compliance with CPAP.  Through shared decision making, Zepbound 2.5 mg weekly has been prescribed today.  Lifestyle modifications aimed at weight loss were reinforced.  Follow-up in 3 months for weight management.      Prediabetes   A1c 6.0 on labs from December 2024.  He continues to take metformin  500 mg twice daily.  Zepbound prescribed today as otherwise documented.      Elevated liver transaminase level   Chronic, stable.  Attributed to underlying hepatic steatosis.  We again reviewed that weight loss is the best treatment.  He continues to follow a low-carb, sugar-free diet but has gained weight since his last appointment.  Zepbound prescribed today as otherwise documented.  Repeat CMP ordered.      Straining on urination   His additional concern today  is increase straining with urination over the last 2-3 weeks.  He attributes this to kidney stones, noting that he has passed all but 1 stone that seems to be "caught".  Denies dysuria, hematuria, and flank pain.  Will trial Flomax 0.4 mg daily for symptom relief.      Return in about 3 months (around 09/30/2023) for Weight management.   Tobi Fortes, MD

## 2023-06-30 NOTE — Assessment & Plan Note (Signed)
 Severe OSA with AHI 41.2 on sleep study from April 2017.  He endorses nightly compliance with CPAP.  As otherwise documented, I have prescribed Zepbound today in the setting of obesity, prediabetes, and severe obstructive sleep apnea.

## 2023-06-30 NOTE — Assessment & Plan Note (Signed)
 His additional concern today is increase straining with urination over the last 2-3 weeks.  He attributes this to kidney stones, noting that he has passed all but 1 stone that seems to be "caught".  Denies dysuria, hematuria, and flank pain.  Will trial Flomax 0.4 mg daily for symptom relief.

## 2023-07-01 ENCOUNTER — Ambulatory Visit: Payer: Self-pay | Admitting: Internal Medicine

## 2023-07-01 ENCOUNTER — Other Ambulatory Visit: Payer: Self-pay | Admitting: Internal Medicine

## 2023-07-01 ENCOUNTER — Encounter: Payer: Self-pay | Admitting: Internal Medicine

## 2023-07-01 DIAGNOSIS — R7303 Prediabetes: Secondary | ICD-10-CM

## 2023-07-01 DIAGNOSIS — E66812 Obesity, class 2: Secondary | ICD-10-CM

## 2023-07-01 DIAGNOSIS — R7989 Other specified abnormal findings of blood chemistry: Secondary | ICD-10-CM

## 2023-07-01 DIAGNOSIS — G4733 Obstructive sleep apnea (adult) (pediatric): Secondary | ICD-10-CM

## 2023-07-01 LAB — CMP14+EGFR
ALT: 51 IU/L — ABNORMAL HIGH (ref 0–44)
AST: 38 IU/L (ref 0–40)
Albumin: 3.9 g/dL (ref 3.8–4.8)
Alkaline Phosphatase: 172 IU/L — ABNORMAL HIGH (ref 44–121)
BUN/Creatinine Ratio: 19 (ref 10–24)
BUN: 17 mg/dL (ref 8–27)
Bilirubin Total: 0.4 mg/dL (ref 0.0–1.2)
CO2: 21 mmol/L (ref 20–29)
Calcium: 10.3 mg/dL — ABNORMAL HIGH (ref 8.6–10.2)
Chloride: 106 mmol/L (ref 96–106)
Creatinine, Ser: 0.88 mg/dL (ref 0.76–1.27)
Globulin, Total: 3 g/dL (ref 1.5–4.5)
Glucose: 99 mg/dL (ref 70–99)
Potassium: 4.4 mmol/L (ref 3.5–5.2)
Sodium: 145 mmol/L — ABNORMAL HIGH (ref 134–144)
Total Protein: 6.9 g/dL (ref 6.0–8.5)
eGFR: 91 mL/min/{1.73_m2} (ref >59)

## 2023-07-02 ENCOUNTER — Telehealth: Payer: Self-pay | Admitting: Pharmacy Technician

## 2023-07-02 ENCOUNTER — Other Ambulatory Visit (HOSPITAL_COMMUNITY): Payer: Self-pay

## 2023-07-02 ENCOUNTER — Telehealth: Payer: Self-pay | Admitting: Pharmacist

## 2023-07-02 NOTE — Telephone Encounter (Signed)
 Yes, an appeal will be filed for OSA.

## 2023-07-02 NOTE — Telephone Encounter (Signed)
 Pharmacy Patient Advocate Encounter   Received notification from Pt Calls Messages that prior authorization for Zepbound 2.5MG /0.5ML pen-injectors is required/requested.   Insurance verification completed.   The patient is insured through [facility] .   Per test claim: PA required; PA submitted to above mentioned insurance via CoverMyMeds Key/confirmation #/EOC [id number] Status is pending

## 2023-07-02 NOTE — Telephone Encounter (Signed)
 PA request has been Started. New Encounter has been or will be created for follow up. For additional info see Pharmacy Prior Auth telephone encounter from 07/02/2023.  It will most likely deny initially. If it does deny the appeals pharmacist will file an appeal for OSA.

## 2023-07-02 NOTE — Telephone Encounter (Signed)
 Pharmacy Patient Advocate Encounter  Received notification from [facility] that Prior Authorization for Zepbound 2.5MG /0.5ML pen-injectors has been DENIED.  Full denial letter will be uploaded to the media tab. See denial reason below.   PA #/Case ID/Reference #: Key: [id number]  Appeals pharmacist will file an appeal for OSA.

## 2023-07-02 NOTE — Telephone Encounter (Signed)
 E-Appeal has been submitted for Zepbound. Will advise when response is received or follow up in 1 week. Please be advised that most companies may take 30 days to make a decision.Appeal letter and supporting documentation have been uploaded and submitted via the [facility] website on 07/02/2023 @11 :13am.  Thank you, Dene Fines, PharmD Clinical Pharmacist  [place]  Direct Dial: [phone]

## 2023-07-02 NOTE — Telephone Encounter (Signed)
 The appeal for Zepbound has been approved by the insurance company:    Thank you, Dene Fines, PharmD Clinical Pharmacist  [place]  Direct Dial: [phone]

## 2023-07-22 ENCOUNTER — Other Ambulatory Visit: Payer: Self-pay | Admitting: Internal Medicine

## 2023-07-22 DIAGNOSIS — N200 Calculus of kidney: Secondary | ICD-10-CM

## 2023-07-30 ENCOUNTER — Other Ambulatory Visit: Payer: Self-pay | Admitting: Internal Medicine

## 2023-07-30 DIAGNOSIS — E66812 Obesity, class 2: Secondary | ICD-10-CM

## 2023-07-30 DIAGNOSIS — G4733 Obstructive sleep apnea (adult) (pediatric): Secondary | ICD-10-CM

## 2023-07-30 DIAGNOSIS — R7989 Other specified abnormal findings of blood chemistry: Secondary | ICD-10-CM

## 2023-07-30 DIAGNOSIS — R7303 Prediabetes: Secondary | ICD-10-CM

## 2023-07-31 ENCOUNTER — Other Ambulatory Visit: Payer: Self-pay

## 2023-07-31 DIAGNOSIS — E66812 Obesity, class 2: Secondary | ICD-10-CM

## 2023-07-31 DIAGNOSIS — R7989 Other specified abnormal findings of blood chemistry: Secondary | ICD-10-CM

## 2023-07-31 DIAGNOSIS — R7303 Prediabetes: Secondary | ICD-10-CM

## 2023-07-31 DIAGNOSIS — G4733 Obstructive sleep apnea (adult) (pediatric): Secondary | ICD-10-CM

## 2023-07-31 MED ORDER — TIRZEPATIDE-WEIGHT MANAGEMENT 2.5 MG/0.5ML ~~LOC~~ SOLN: 2.5000 mg | SUBCUTANEOUS | 2 refills | Status: DC

## 2023-08-03 NOTE — Progress Notes (Unsigned)
 Virtual Visit via Telephone Note   Because of John Griffin's co-morbid illnesses, he is at least at moderate risk for complications without adequate follow up.  This format is felt to be most appropriate for this patient at this time.  The patient did not have access to video technology/had technical difficulties with video requiring transitioning to audio format only (telephone).  All issues noted in this document were discussed and addressed.  No physical exam could be performed with this format.  Please refer to the patient's chart for his consent to telehealth for [facility].  Evaluation Performed:  Preoperative cardiovascular risk assessment _____________   Date:  08/03/2023   Patient ID:  John Griffin, DOB [birth year], MRN [id number] Patient Location:  Home Provider location:   Office  Primary Care Provider:  Bevely Doffing, FNP Primary Cardiologist:  John Lesches, MD  Chief Complaint John Griffin   73 y.o. y/o male with a h/o ongoing tobacco abuse (1 pack/day), hypertension, OSA, NSTEMI (cardiac catheterization 02/04/2016 revealing 100% nondominant circumflex, 70% proximal LAD and mid dominant artery stenosis), and dyslipidemia who is pending colonoscopy and presents today for telephonic preoperative cardiovascular risk assessment.  He was last seen by me back in 2023, but was seen by Dr. Lesches 07/25/2022. He had an NSTEMI 6 days prior to admission on 02/03/2016 with post MI angina.  Catheterization in 02/04/2016 revealing 100% nondominant circumflex, 70% proximal LAD and mid dominant RCA and EF 50 to 55%.  He continues to smoke a pack a day.  Some shortness of breath probably related to COPD.  Denies chest pain.  Head trauma with left fifth toe an ulcer that was nonhealing.  Ultimately underwent left fifth toe resection with Dr. Harden.  Today, he  who presents for cardiovascular follow-up.  He experiences no chest pain or shortness of breath. He uses a treadmill three to four  times a week, increasing his duration to about four minutes at a time. His shortness of breath remains stable.  He smokes approximately one pack of cigarettes daily, having switched to light cigarettes without significant reduction in quantity.  He continues to use a CPAP machine at night, which improves his sleep quality. He cannot sleep flat and prefers a recliner.  Recent labs in June showed a slight elevation in alkaline phosphatase. He started Zepbound  five weeks ago, resulting in a slight weight decrease. He is on a 2.5 mg weekly dose. Previous labs from December indicated good cholesterol levels, with triglycerides at 144 and total cholesterol at 115.  Reports no shortness of breath nor dyspnea on exertion. Reports no chest pain, pressure, or tightness. No edema, orthopnea, PND. Reports no palpitations.   Discussed the use of AI scribe software for clinical note transcription with the patient, who gave verbal consent to proceed.    Past Medical History    Past Medical History:  Diagnosis Date   Arthritis    hands  and shoulders   CAD (coronary artery disease)    a. 01/2016: NSTEMI with cath showing 100% Prox Cx stenosis, 70% Prox LAD, 75% 1st Diag, and 60-70% RCA stenosis with medical therapy pursued as the LCx had collateral flow noted   Cataract    COPD (chronic obstructive pulmonary disease) (HCC)    Dyspnea    Hypertension    Myocardial infarction (HCC)    01/2016   Pneumonia    Prostate cancer (HCC)    s/p prostatectomy   Sleep apnea    cpap   Tobacco use  Umbilical hernia    Urinary frequency    Past Surgical History:  Procedure Laterality Date   AMPUTATION Left 07/04/2022   Procedure: LEFT FOOT 5TH RAY AMPUTATION;  Surgeon: John Jerona GAILS, MD;  Location: [facility] OR;  Service: Orthopedics;  Laterality: Left;   bone graft right ankle   1972   CARDIAC CATHETERIZATION N/A 02/04/2016   Procedure: Left Heart Cath and Coronary Angiography;  Surgeon: John Griffin Lesches, MD;   Location: [facility] INVASIVE CV LAB;  Service: Cardiovascular;  Laterality: N/A;   COLONOSCOPY WITH PROPOFOL  N/A 09/19/2016   Procedure: COLONOSCOPY WITH PROPOFOL ;  Surgeon: John Dover, MD;  Location[facility]OS[facility];  Service: Endoscopy;  Laterality: N/A;   COLONOSCOPY WITH PROPOFOL  N/A 10/04/2021   Procedure: COLONOSCOPY WITH PROPOFOL ;  Surgeon: John Dover, MD;  Location: [facility] ENDOSCOPY;  Service: Gastroenterology;  Laterality: N/A;   FRACTURE SURGERY     HERNIA REPAIR     LYMPHADENECTOMY Bilateral 08/17/2014   Procedure: LYMPHADENECTOMY;  Surgeon: John Ferrara, MD;  Location: [facility] ORS;  Service: Urology;  Laterality: Bilateral;   POLYPECTOMY  10/04/2021   Procedure: POLYPECTOMY;  Surgeon: John Dover, MD;  Location: [facility];  Service: Gastroenterology;;   right shoulder surgery   1992   ROBOT ASSISTED LAPAROSCOPIC RADICAL PROSTATECTOMY N/A 08/17/2014   Procedure: ROBOTIC ASSISTED LAPAROSCOPIC RADICAL PROSTATECTOMY LEVEL 3  AND UMBILICAL HERNIA REPAIR;  Surgeon: John Ferrara, MD;  Location: [facility];  Service: Urology;  Laterality: N/A;    Allergies  Allergies  Allergen Reactions   Lisinopril  Cough    Home Medications    Prior to Admission medications   Medication Sig Start Date End Date Taking? Authorizing Provider  acetaminophen  (TYLENOL ) 500 MG tablet Take 1,000 mg by mouth every 6 (six) hours as needed for mild pain. Reported on 05/09/2015    [provider]  albuterol  (VENTOLIN  HFA) 108 (90 Base) MCG/ACT inhaler Inhale 2 puffs into the lungs every 4 (four) hours as needed for wheezing or shortness of breath. 11/27/20   Griffin, John RAMAN, NP  aspirin  81 MG chewable tablet Chew 1 tablet (81 mg total) by mouth daily. 02/07/16   Griffin, John HERO, PA-C  atorvastatin  (LIPITOR ) 80 MG tablet TAKE 1 TABLET BY MOUTH EVERY DAY 03/07/21   Griffin John JINNY, MD  calcium  carbonate (TUMS EX) 750 MG chewable tablet Chew 1 tablet by mouth as needed for heartburn.     [provider]  cetirizine (ZYRTEC) 10 MG tablet Take 10 mg by mouth daily.     [provider]  Chlorpheniramine-DM (CORICIDIN HBP COUGH/COLD PO) Take 1 tablet by mouth at bedtime as needed (congestion).    [provider]  Cholecalciferol 50 MCG (2000 UT) CAPS Take 2,000 Units by mouth daily.     [provider]  clopidogrel  (PLAVIX ) 75 MG tablet TAKE 1 TABLET BY MOUTH EVERY DAY 12/10/20   Griffin John JINNY, MD  fluticasone  (FLONASE ) 50 MCG/ACT nasal spray Place 2 sprays into both nostrils daily.     [provider]  furosemide  (LASIX ) 20 MG tablet TAKE 1 TABLET BY MOUTH EVERY DAY 06/11/21   Griffin John JINNY, MD  isosorbide  mononitrate (IMDUR ) 30 MG 24 hr tablet TAKE 1 TABLET BY MOUTH EVERY DAY 09/16/21   Griffin John JINNY, MD  KLOR-CON  M10 10 MEQ tablet TAKE 1 TABLET BY MOUTH EVERY DAY 06/11/21   Griffin John JINNY, MD  losartan  (COZAAR ) 25 MG tablet TAKE 1 TABLET (25 MG TOTAL) BY MOUTH DAILY. DUE FOR YEARLY FOLLOW  UP WITH CARDIOLOGY 03/07/21   Court John PARAS, MD  metFORMIN  (GLUCOPHAGE ) 500 MG tablet Take by mouth. 01/10/19   [provider]  metoprolol  tartrate (LOPRESSOR ) 25 MG tablet Take 1 tablet (25 mg total) by mouth 2 (two) times daily. 09/10/21   [facility] John PARAS, MD  nitroGLYCERIN  (NITROSTAT ) 0.4 MG SL tablet Place 1 tablet (0.4 mg total) under the tongue every 5 (five) minutes x 3 doses as needed for chest pain. 05/08/17   Maxie Herlene POUR, PA-C    Physical Exam    Vital Signs:  Tydarius Yawn does not have vital signs available for review today.  Given telephonic nature of communication, physical exam is limited. AAOx3. NAD. Normal affect.  Speech and respirations are unlabored.  Accessory Clinical Findings    None  Assessment & Plan   Elevated liver enzymes Liver enzymes improved, one enzyme slightly elevated. Zepbound  expected to enhance liver function. - Reassess liver function and Zepbound  dosage in September.  CAD -no chest  pain -continue current medication regimen -continue heart healthy/low-sodium diet  Obstructive sleep apnea CPAP effective in improving sleep quality.  Tobacco use Smokes one pack per day, switched to light cigarettes, no reduction efforts. -cessation advised  Toe amputation Surgical site healed without complications.   HLD -labs reviewed -due to lipid panel in Dec     Koralee Wedeking N [place], [place]  08/03/2023, 1:30 PM

## 2023-08-06 ENCOUNTER — Ambulatory Visit: Attending: Physician Assistant | Admitting: Physician Assistant

## 2023-08-06 VITALS — BP 122/68 | HR 99 | Ht 76.0 in | Wt 311.0 lb

## 2023-08-06 DIAGNOSIS — I10 Essential (primary) hypertension: Secondary | ICD-10-CM | POA: Diagnosis present

## 2023-08-06 DIAGNOSIS — G4733 Obstructive sleep apnea (adult) (pediatric): Secondary | ICD-10-CM | POA: Diagnosis present

## 2023-08-06 DIAGNOSIS — E785 Hyperlipidemia, unspecified: Secondary | ICD-10-CM | POA: Diagnosis present

## 2023-08-06 DIAGNOSIS — I2510 Atherosclerotic heart disease of native coronary artery without angina pectoris: Secondary | ICD-10-CM | POA: Diagnosis present

## 2023-08-06 DIAGNOSIS — Z720 Tobacco use: Secondary | ICD-10-CM | POA: Diagnosis present

## 2023-08-06 MED ORDER — LOSARTAN POTASSIUM 25 MG PO TABS: 25.0000 mg | ORAL_TABLET | Freq: Every day | ORAL | 3 refills | Status: AC

## 2023-08-06 MED ORDER — METOPROLOL TARTRATE 25 MG PO TABS: 25.0000 mg | ORAL_TABLET | Freq: Two times a day (BID) | ORAL | 3 refills | Status: AC

## 2023-08-06 MED ORDER — NITROGLYCERIN 0.4 MG SL SUBL: 0.4000 mg | SUBLINGUAL_TABLET | SUBLINGUAL | 3 refills | Status: AC | PRN

## 2023-08-06 MED ORDER — CLOPIDOGREL BISULFATE 75 MG PO TABS: 75.0000 mg | ORAL_TABLET | Freq: Every day | ORAL | 3 refills | Status: AC

## 2023-08-06 MED ORDER — FUROSEMIDE 20 MG PO TABS: 20.0000 mg | ORAL_TABLET | Freq: Every day | ORAL | 3 refills | Status: AC

## 2023-08-06 MED ORDER — POTASSIUM CHLORIDE CRYS ER 10 MEQ PO TBCR: 10.0000 meq | EXTENDED_RELEASE_TABLET | Freq: Every day | ORAL | 3 refills | Status: AC

## 2023-08-06 MED ORDER — ATORVASTATIN CALCIUM 80 MG PO TABS: 80.0000 mg | ORAL_TABLET | Freq: Every day | ORAL | 3 refills | Status: AC

## 2023-08-06 MED ORDER — ISOSORBIDE MONONITRATE ER 30 MG PO TB24: 30.0000 mg | ORAL_TABLET | Freq: Every day | ORAL | 3 refills | Status: AC

## 2023-08-06 NOTE — Patient Instructions (Signed)
 Medication Instructions:  Your physician recommends that you continue on your current medications as directed. Please refer to the Current Medication list given to you today.  *If you need a refill on your cardiac medications before your next appointment, please call your pharmacy*  Lab Work: None ordered  If you have labs (blood work) drawn today and your tests are completely normal, you will receive your results only by: MyChart Message (if you have MyChart) OR A paper copy in the mail If you have any lab test that is abnormal or we need to change your treatment, we will call you to review the results.  Testing/Procedures: None ordered  Follow-Up: At [facility], you and your health needs are our priority.  As part of our continuing mission to provide you with exceptional heart care, our providers are all part of one team.  This team includes your primary Cardiologist (physician) and Advanced Practice Providers or APPs (Physician Assistants and Nurse Practitioners) who all work together to provide you with the care you need, when you need it.  Your next appointment:   12 month(s)  Provider:   Lauro Portal, MD    We recommend signing up for the patient portal called "MyChart".  Sign up information is provided on this After Visit Summary.  MyChart is used to connect with patients for Virtual Visits (Telemedicine).  Patients are able to view lab/test results, encounter notes, upcoming appointments, etc.  Non-urgent messages can be sent to your provider as well.   To learn more about what you can do with MyChart, go to [email or web address].   Other Instructions

## 2023-09-30 ENCOUNTER — Other Ambulatory Visit: Payer: Self-pay

## 2023-09-30 ENCOUNTER — Ambulatory Visit

## 2023-09-30 VITALS — BP 111/64 | HR 99 | Ht 76.0 in | Wt 312.1 lb

## 2023-09-30 DIAGNOSIS — Z23 Encounter for immunization: Secondary | ICD-10-CM | POA: Diagnosis not present

## 2023-09-30 DIAGNOSIS — R7303 Prediabetes: Secondary | ICD-10-CM

## 2023-09-30 DIAGNOSIS — Z6837 Body mass index (BMI) 37.0-37.9, adult: Secondary | ICD-10-CM

## 2023-09-30 DIAGNOSIS — E66812 Obesity, class 2: Secondary | ICD-10-CM

## 2023-09-30 DIAGNOSIS — E785 Hyperlipidemia, unspecified: Secondary | ICD-10-CM | POA: Diagnosis not present

## 2023-09-30 MED ORDER — TIRZEPATIDE-WEIGHT MANAGEMENT 5 MG/0.5ML ~~LOC~~ SOLN: 5.0000 mg | SUBCUTANEOUS | 1 refills | Status: DC

## 2023-09-30 MED ORDER — ZEPBOUND 5 MG/0.5ML ~~LOC~~ SOAJ: 5.0000 mg | SUBCUTANEOUS | 1 refills | Status: DC

## 2023-09-30 NOTE — Progress Notes (Unsigned)
 Established Patient Office Visit  Subjective   Patient ID: John Griffin, male    DOB: 04/29/1950  Age: 73 y.o. MRN: [id number]  Chief Complaint  Patient presents with   Medical Management of Chronic Issues    3 month follow up for weight management    HPI Overweight/Obesity Complicated by toe amputation:  Goal on track: NO.  Unable to achieve goal weight loss through lifestyle modification alone; current treatment: Zepbound  2.5 mg;   Medications/Strategies previously tried: Zepbound   Baseline weight: 360; most recent weight: 312  Current meal patterns: breakfast: Biscuit, pastry or boiled egg; lunch: sandwich; dinner: grilled chicken, salad; snacks: fruit or veggies; drinks: water , milk  Current exercise: limited to walking  Extensive dietary counseling including education on focus on lean proteins, fruits and vegetables, whole grains and increased fiber consumption, adequate hydration  Extensive exercise counseling including eventual goal of 150 minutes of moderate intensity exercise weekly; role of resistance training; breaking up prolonged sedentary time  Recommended limiting carbs and increasing physical activity.   Discussed the use of AI scribe software for clinical note transcription with the patient, who gave verbal consent to proceed.  History of Present Illness    Nil Xiong is a 73 year old male who presents for follow-up regarding his medication management.  Medication management for weight loss - Currently taking Zepbound  2.5 mg weekly for three months - Administers injections on Wednesday mornings - Has three syringes remaining from a prescription received last week - Medication is effective but not as rapidly as desired - Expresses concern about medication cost if efficacy is insufficient - Considering increasing the dose  Dietary and lifestyle modifications - Adheres to a low sugar and low carbohydrate diet as recommended by orthopedic surgeon - Ensures  adequate hydration  Post-amputation status - Underwent little toe amputation one year ago due to non-healing fracture with subsequent infection - Amputation site has healed well without pain  Immunization status - Received influenza vaccination - Actively checking with local pharmacies for availability of new COVID vaccine      Patient Active Problem List   Diagnosis Date Noted   Straining on urination 06/30/2023   Need for influenza vaccination 12/30/2022   Elevated liver transaminase level 12/30/2022   Gangrene of toe (HCC) 07/02/2022   Venous ulcer of right lower extremity with varicose veins (HCC) 07/02/2022   Thoracic aortic atherosclerosis (HCC) 11/27/2021   Elevated liver function tests 09/22/2018   Prediabetes 09/22/2018   CAD (coronary artery disease) 08/19/2016   Dyslipidemia, goal LDL below 70 02/29/2016   NSTEMI (non-ST elevation myocardial infarction) (HCC) 02/03/2016   Acute CHF (HCC) 02/03/2016   OSA (obstructive sleep apnea) 06/27/2015   COPD (chronic obstructive pulmonary disease) (HCC) 03/14/2015   Hypersomnia 03/14/2015   Exertional dyspnea 03/14/2015   Obesity 03/14/2015   Osteoarthritis 03/01/2015   Tobacco use    Essential hypertension    Prostate cancer (HCC) 08/17/2014    ROS    Objective:     BP 111/64   Pulse 99   Ht 6' 4 (1.93 m)   Wt (!) 312 lb 1.3 oz (141.6 kg)   SpO2 90%   BMI 37.99 kg/m  BP Readings from Last 3 Encounters:  09/30/23 111/64  08/06/23 122/68  06/30/23 136/80   Wt Readings from Last 3 Encounters:  09/30/23 (!) 312 lb 1.3 oz (141.6 kg)  08/06/23 (!) 311 lb (141.1 kg)  06/30/23 (!) 312 lb 9.6 oz (141.8 kg)      Physical  Exam Vitals and nursing note reviewed.  Constitutional:      Appearance: Normal appearance. He is obese.  HENT:     Head: Normocephalic.  Eyes:     Extraocular Movements: Extraocular movements intact.     Pupils: Pupils are equal, round, and reactive to light.  Cardiovascular:     Rate  and Rhythm: Normal rate and regular rhythm.  Pulmonary:     Effort: Pulmonary effort is normal.     Breath sounds: Normal breath sounds.  Musculoskeletal:     Cervical back: Normal range of motion and neck supple.  Neurological:     Mental Status: He is alert and oriented to person, place, and time.     Gait: Gait abnormal.  Psychiatric:        Mood and Affect: Mood normal.        Thought Content: Thought content normal.     The ASCVD Risk score (Arnett DK, et al., 2019) failed to calculate for the following reasons:   Risk score cannot be calculated because patient has a medical history suggesting prior/existing ASCVD    Assessment & Plan:   Problem List Items Addressed This Visit       Other   Obesity   Current weight 312 pounds.  BMI 38.  He has not lost any more weight since his last appointment despite adhering to a low-carb, sugar-free diet.  Known history of severe obstructive sleep apnea.  He endorses nightly compliance with CPAP.   - Increase Zepbound  to 5 mg. - Emphasized adherence to dosing schedule. - Encouraged dietary modifications: low sugar, low carbohydrates. - Ensure adequate hydration.      Relevant Medications   tirzepatide  (ZEPBOUND ) 5 MG/0.5ML Pen   Dyslipidemia, goal LDL below 70   Re-evaluation of cholesterol levels needed. - Order fasting blood work to recheck cholesterol levels.      Relevant Orders   CMP14+EGFR (Completed)   Lipid panel (Completed)   Prediabetes - Primary   Prediabetes with recent dietary changes as advised by an orthopedic surgeon. - Order fasting blood work for A1c and cholesterol.      Relevant Orders   CMP14+EGFR (Completed)   Hemoglobin A1c (Completed)   Other Visit Diagnoses       Immunization due       Relevant Orders   Flu vaccine HIGH DOSE PF(Fluzone Trivalent) (Completed)           No follow-ups on file.    Leita Longs, FNP

## 2023-10-01 LAB — LIPID PANEL
Chol/HDL Ratio: 3.2 ratio (ref 0.0–5.0)
Cholesterol, Total: 99 mg/dL — ABNORMAL LOW (ref 100–199)
HDL: 31 mg/dL — ABNORMAL LOW
LDL Chol Calc (NIH): 43 mg/dL (ref 0–99)
Triglycerides: 142 mg/dL (ref 0–149)
VLDL Cholesterol Cal: 25 mg/dL (ref 5–40)

## 2023-10-01 LAB — CMP14+EGFR
ALT: 52 IU/L — ABNORMAL HIGH (ref 0–44)
AST: 40 IU/L (ref 0–40)
Albumin: 3.9 g/dL (ref 3.8–4.8)
Alkaline Phosphatase: 159 IU/L — ABNORMAL HIGH (ref 44–121)
BUN/Creatinine Ratio: 20 (ref 10–24)
BUN: 16 mg/dL (ref 8–27)
Bilirubin Total: 0.4 mg/dL (ref 0.0–1.2)
CO2: 22 mmol/L (ref 20–29)
Calcium: 9.7 mg/dL (ref 8.6–10.2)
Chloride: 104 mmol/L (ref 96–106)
Creatinine, Ser: 0.81 mg/dL (ref 0.76–1.27)
Globulin, Total: 2.7 g/dL (ref 1.5–4.5)
Glucose: 102 mg/dL — ABNORMAL HIGH (ref 70–99)
Potassium: 3.8 mmol/L (ref 3.5–5.2)
Sodium: 142 mmol/L (ref 134–144)
Total Protein: 6.6 g/dL (ref 6.0–8.5)
eGFR: 93 mL/min/1.73

## 2023-10-01 LAB — HEMOGLOBIN A1C
Est. average glucose Bld gHb Est-mCnc: 123 mg/dL
Hgb A1c MFr Bld: 5.9 % — ABNORMAL HIGH (ref 4.8–5.6)

## 2023-10-04 ENCOUNTER — Ambulatory Visit: Payer: Self-pay

## 2023-10-04 NOTE — Assessment & Plan Note (Signed)
 Prediabetes with recent dietary changes as advised by an orthopedic surgeon. - Order fasting blood work for A1c and cholesterol.

## 2023-10-04 NOTE — Assessment & Plan Note (Signed)
 Current weight 312 pounds.  BMI 38.  He has not lost any more weight since his last appointment despite adhering to a low-carb, sugar-free diet.  Known history of severe obstructive sleep apnea.  He endorses nightly compliance with CPAP.   - Increase Zepbound  to 5 mg. - Emphasized adherence to dosing schedule. - Encouraged dietary modifications: low sugar, low carbohydrates. - Ensure adequate hydration.

## 2023-10-04 NOTE — Assessment & Plan Note (Signed)
 Re-evaluation of cholesterol levels needed. - Order fasting blood work to recheck cholesterol levels.

## 2023-10-05 ENCOUNTER — Ambulatory Visit

## 2023-10-22 ENCOUNTER — Ambulatory Visit

## 2023-10-22 ENCOUNTER — Encounter: Payer: Self-pay | Admitting: Emergency Medicine

## 2023-10-22 VITALS — Ht 76.0 in | Wt 312.0 lb

## 2023-10-22 DIAGNOSIS — Z0001 Encounter for general adult medical examination with abnormal findings: Secondary | ICD-10-CM

## 2023-10-22 DIAGNOSIS — F17210 Nicotine dependence, cigarettes, uncomplicated: Secondary | ICD-10-CM | POA: Diagnosis not present

## 2023-10-22 DIAGNOSIS — Z0000 Encounter for general adult medical examination without abnormal findings: Secondary | ICD-10-CM

## 2023-10-22 NOTE — Progress Notes (Signed)
 Subjective:   John Griffin is a 73 y.o. who presents for a Medicare Wellness preventive visit.  As a reminder, Annual Wellness Visits don't include a physical exam, and some assessments may be limited, especially if this visit is performed virtually. We may recommend an in-person follow-up visit with your provider if needed.  Visit Complete: Virtual I connected with  John Griffin on 10/22/23 by a audio enabled telemedicine application and verified that I am speaking with the correct person using two identifiers.  Patient Location: Home  Provider Location: Home Office  I discussed the limitations of evaluation and management by telemedicine. The patient expressed understanding and agreed to proceed.  Vital Signs: Because this visit was a virtual/telehealth visit, some criteria may be missing or patient reported. Any vitals not documented were not able to be obtained and vitals that have been documented are patient reported.  VideoDeclined- This patient declined Librarian, academic. Therefore the visit was completed with audio only.  Persons Participating in Visit: Patient.  AWV Questionnaire: Yes: Patient Medicare AWV questionnaire was completed by the patient on 10/21/2023; I have confirmed that all information answered by patient is correct and no changes since this date.  Cardiac Risk Factors include: advanced age (>65men, >24 women);dyslipidemia;male gender;obesity (BMI >30kg/m2);sedentary lifestyle;Other (see comment), Risk factor comments: OSA on cpap, coronary artery disease, COPD     Objective:    Today's Vitals   10/22/23 0757  Weight: (!) 312 lb (141.5 kg)  Height: 6' 4 (1.93 m)  PainSc: 0-No pain   Body mass index is 37.98 kg/m.     10/22/2023    8:00 AM 07/02/2022    2:46 PM 10/04/2021    9:12 AM 09/19/2016   11:57 AM 09/17/2016    2:28 PM 02/03/2016    4:00 PM 02/03/2016    8:30 AM  Advanced Directives  Does Patient Have a Medical  Advance Directive? No No No No  No  No  No   Would patient like information on creating a medical advance directive? Yes (MAU/Ambulatory/Procedural Areas - Information given) No - Patient declined No - Patient declined No - Patient declined  No - Patient declined  No - Patient declined       Data saved with a previous flowsheet row definition    Current Medications (verified) Outpatient Encounter Medications as of 10/22/2023  Medication Sig   albuterol  (VENTOLIN  HFA) 108 (90 Base) MCG/ACT inhaler Inhale 2 puffs into the lungs every 4 (four) hours as needed for wheezing or shortness of breath.   aspirin  81 MG chewable tablet Chew 1 tablet (81 mg total) by mouth daily.   atorvastatin  (LIPITOR ) 80 MG tablet Take 1 tablet (80 mg total) by mouth daily.   cetirizine (ZYRTEC) 10 MG tablet Take 10 mg by mouth daily.   clopidogrel  (PLAVIX ) 75 MG tablet Take 1 tablet (75 mg total) by mouth daily.   fluticasone  (FLONASE ) 50 MCG/ACT nasal spray Place 2 sprays into both nostrils daily as needed (nasal allergies).   furosemide  (LASIX ) 20 MG tablet Take 1 tablet (20 mg total) by mouth daily.   isosorbide  mononitrate (IMDUR ) 30 MG 24 hr tablet Take 1 tablet (30 mg total) by mouth daily.   latanoprost (XALATAN) 0.005 % ophthalmic solution Place 1 drop into both eyes at bedtime.   losartan  (COZAAR ) 25 MG tablet Take 1 tablet (25 mg total) by mouth daily.   metFORMIN  (GLUCOPHAGE ) 500 MG tablet TAKE 1 TABLET (500 MG TOTAL) BY MOUTH IN THE  MORNING AND AT BEDTIME   metoprolol  tartrate (LOPRESSOR ) 25 MG tablet Take 1 tablet (25 mg total) by mouth 2 (two) times daily.   nitroGLYCERIN  (NITROSTAT ) 0.4 MG SL tablet Place 1 tablet (0.4 mg total) under the tongue every 5 (five) minutes x 3 doses as needed for chest pain.   potassium chloride  (KLOR-CON  M) 10 MEQ tablet Take 1 tablet (10 mEq total) by mouth daily.   tirzepatide  (ZEPBOUND ) 5 MG/0.5ML Pen Inject 5 mg into the skin once a week.   No facility-administered  encounter medications on file as of 10/22/2023.    Allergies (verified) Lisinopril    History: Past Medical History:  Diagnosis Date   Arthritis    hands  and shoulders   CAD (coronary artery disease)    a. 01/2016: NSTEMI with cath showing 100% Prox Cx stenosis, 70% Prox LAD, 75% 1st Diag, and 60-70% RCA stenosis with medical therapy pursued as the LCx had collateral flow noted   Cataract    COPD (chronic obstructive pulmonary disease) (HCC)    Dyspnea    Hypertension    Myocardial infarction (HCC)    01/2016   Pneumonia    Prostate cancer [facility])    s/p prostatectomy   Sleep apnea    cpap   Tobacco use    Umbilical hernia    Urinary frequency    Past Surgical History:  Procedure Laterality Date   AMPUTATION Left 07/04/2022   Procedure: LEFT FOOT 5TH RAY AMPUTATION;  Surgeon: Harden Jerona GAILS, MD;  Location: [facility] OR;  Service: Orthopedics;  Laterality: Left;   bone graft right ankle   1972   CARDIAC CATHETERIZATION N/A 02/04/2016   Procedure: Left Heart Cath and Coronary Angiography;  Surgeon: Dorn JINNY Lesches, MD;  Location: [facility] INVASIVE CV LAB;  Service: Cardiovascular;  Laterality: N/A;   COLONOSCOPY WITH PROPOFOL  N/A 09/19/2016   Procedure: COLONOSCOPY WITH PROPOFOL ;  Surgeon: Rollin Dover, MD;  Location: [facility] ENDOSCOPY;  Service: Endoscopy;  Laterality: N/A;   COLONOSCOPY WITH PROPOFOL  N/A 10/04/2021   Procedure: COLONOSCOPY WITH PROPOFOL ;  Surgeon: Rollin Dover, MD;  Location: [facility] ENDOSCOPY;  Service: Gastroenterology;  Laterality: N/A;   FRACTURE SURGERY     HERNIA REPAIR     LYMPHADENECTOMY Bilateral 08/17/2014   Procedure: LYMPHADENECTOMY;  Surgeon: Gretel Ferrara, MD;  Location: [facility] ORS;  Service: Urology;  Laterality: Bilateral;   POLYPECTOMY  10/04/2021   Procedure: POLYPECTOMY;  Surgeon: Rollin Dover, MD;  Location: [facility];  Service: Gastroenterology;;   right shoulder surgery   1992   ROBOT ASSISTED LAPAROSCOPIC RADICAL PROSTATECTOMY N/A 08/17/2014   Procedure:  ROBOTIC ASSISTED LAPAROSCOPIC RADICAL PROSTATECTOMY LEVEL 3  AND UMBILICAL HERNIA REPAIR;  Surgeon: Gretel Ferrara, MD;  Location: [facility] ORS;  Service: Urology;  Laterality: N/A;   Family History  Problem Relation Age of Onset   Dementia Mother    Heart attack Father 66   CAD Father        s/p cabg   Social History   Socioeconomic History   Marital status: Married    Spouse name: Not on file   Number of children: Not on file   Years of education: Not on file   Highest education level: 12th grade  Occupational History   Not on file  Tobacco Use   Smoking status: Every Day    Current packs/day: 1.00    Average packs/day: 1 pack/day for 53.1 years (53.1 ttl pk-yrs)    Types: Cigarettes    Start date: 09/10/1970   Smokeless  tobacco: Never   Tobacco comments:    back to smoking a 1 ppd since 2018  Vaping Use   Vaping status: Never Used  Substance and Sexual Activity   Alcohol use: Not Currently    Comment: occasional    Drug use: No   Sexual activity: Not Currently  Other Topics Concern   Not on file  Social History Narrative   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 Court with [facility]   Social Drivers of Health   Financial Resource Strain: Low Risk  (10/22/2023)   Overall Financial Resource Strain (CARDIA)    Difficulty of Paying Living Expenses: Not hard at all  Food Insecurity: No Food Insecurity (10/22/2023)   Hunger Vital Sign    Worried About Running Out of Food in the Last Year: Never true    Ran Out of Food in the Last Year: Never true  Transportation Needs: No Transportation Needs (10/22/2023)   PRAPARE - Administrator, Civil Service (Medical): No    Lack of Transportation (Non-Medical): No  Physical Activity: Insufficiently Active (10/22/2023)   Exercise Vital Sign    Days of Exercise per Week: 7 days    Minutes of Exercise per Session: 20 min  Stress: No Stress Concern Present  (10/22/2023)   [employer] of Occupational Health - Occupational Stress Questionnaire    Feeling of Stress: Not at all  Social Connections: Moderately Isolated (10/22/2023)   Social Connection and Isolation Panel    Frequency of Communication with Friends and Family: More than three times a week    Frequency of Social Gatherings with Friends and Family: Once a week    Attends Religious Services: Never    Database administrator or Organizations: No    Attends Engineer, structural: Never    Marital Status: Married    Tobacco Counseling Ready to quit: No Counseling given: Yes Tobacco comments: back to smoking a 1 ppd since 2018    Clinical Intake:  Pre-visit preparation completed: Yes  Pain : No/denies pain Pain Score: 0-No pain     BMI - recorded: 37.98 Nutritional Status: BMI > 30  Obese Nutritional Risks: None Diabetes: No  Lab Results  Component Value Date   HGBA1C 5.9 (H) 09/30/2023   HGBA1C 6.0 (H) 12/30/2022   HGBA1C 6.0 (H) 02/03/2016     How often do you need to have someone help you when you read instructions, pamphlets, or other written materials from your doctor or pharmacy?: 1 - Never  Interpreter Needed?: No  Information entered by :: Taijuan Serviss W CMA (AAMA)   Activities of Daily Living     10/22/2023    8:00 AM 10/21/2023   10:56 AM  In your present state of health, do you have any difficulty performing the following activities:  Hearing? 0 0  Vision? 0 0  Difficulty concentrating or making decisions? 0 0  Walking or climbing stairs? 0 0  Dressing or bathing? 0 0  Doing errands, shopping? 0 0  Preparing Food and eating ? N N  Using the Toilet? N N  In the past six months, have you accidently leaked urine? N Y  Do you have problems with loss of bowel control? N N  Managing your Medications? N N  Managing your Finances? N N  Housekeeping or managing your Housekeeping? N N    Patient Care Team: Bevely Doffing, FNP as  PCP - General  (Family Medicine) Court Dorn PARAS, MD as Consulting Physician (Cardiology) Leila Bound, [place] as Consulting Physician (Optometry) Harden Jerona GAILS, MD as Consulting Physician (Orthopedic Surgery) Rollin Dover, MD as Consulting Physician (Gastroenterology)  I have updated your Care Teams any recent Medical Services you may have received from other providers in the past year.     Assessment:   This is a routine wellness examination for John Griffin.  Hearing/Vision screen Hearing Screening - Comments:: Patient denies any hearing difficulties.     Goals Addressed               This Visit's Progress     Lose ~50lbs (pt-stated)          Depression Screen     10/22/2023    8:05 AM 09/30/2023    8:12 AM 06/30/2023    7:59 AM 12/30/2022    8:57 AM  PHQ 2/9 Scores  PHQ - 2 Score 0 0 0 0  PHQ- 9 Score 0 1 1 0     Fall Risk     10/21/2023   10:56 AM 06/30/2023    7:59 AM 12/30/2022    8:57 AM  Fall Risk   Falls in the past year? 0 0 1  Number falls in past yr: 1 0 1  Injury with Fall? 0 0 1  Risk for fall due to : History of fall(s);Impaired balance/gait;Impaired mobility Impaired balance/gait;Impaired mobility History of fall(s);Orthopedic patient;Impaired balance/gait  Follow up Falls evaluation completed;Education provided;Falls prevention discussed Falls evaluation completed;Education provided;Falls prevention discussed Falls evaluation completed    MEDICARE RISK AT HOME:  Medicare Risk at Home Any stairs in or around the home?: (Patient-Rptd) Yes If so, are there any without handrails?: (Patient-Rptd) No Home free of loose throw rugs in walkways, pet beds, electrical cords, etc?: (Patient-Rptd) Yes Adequate lighting in your home to reduce risk of falls?: (Patient-Rptd) Yes Life alert?: (Patient-Rptd) No Use of a cane, walker or w/c?: (Patient-Rptd) Yes Grab bars in the bathroom?: (Patient-Rptd) No Shower chair or bench in shower?: (Patient-Rptd) Yes Elevated toilet  seat or a handicapped toilet?: (Patient-Rptd) No  TIMED UP AND GO:  Was the test performed?  No  Cognitive Function: 6CIT completed        10/22/2023    8:04 AM  6CIT Screen  What Year? 0 points  What month? 0 points  What time? 0 points  Count back from 20 0 points  Months in reverse 0 points  Repeat phrase 0 points  Total Score 0 points    Immunizations Immunization History  Administered Date(s) Administered   Fluad Trivalent(High Dose 65+) 12/30/2022   INFLUENZA, HIGH DOSE SEASONAL PF 10/24/2015, 02/23/2017, 12/04/2017, 10/21/2018, 11/21/2020, 09/30/2023   Influenza, Seasonal, Injecte, Preservative Fre 02/28/2015   Influenza,inj,Quad PF,6+ Mos 02/28/2015   Influenza-Unspecified 11/21/2015   Janssen (J&J) SARS-COV-2 Vaccination 05/25/2019   Moderna Covid-19 Fall Seasonal Vaccine 97yrs & older 11/27/2021   PNEUMOCOCCAL CONJUGATE-20 11/27/2021   Pfizer Covid-19 Vaccine Bivalent Booster 12yrs & up 11/21/2020   Pneumococcal Conjugate-13 02/25/2016   Pneumococcal Polysaccharide-23 02/28/2015   Td (Adult),5 Lf Tetanus Toxid, Preservative Free 06/23/2022    Screening Tests Health Maintenance  Topic Date Due   Zoster Vaccines- Shingrix (1 of 2) Never done   Lung Cancer Screening  02/10/2020   DTaP/Tdap/Td (1 - Tdap) 06/24/2022   COVID-19 Vaccine (4 - 2025-26 season) 09/21/2023   Medicare Annual Wellness (AWV)  10/21/2024   Colonoscopy  10/05/2031   Pneumococcal Vaccine: 50+  Years  Completed   Influenza Vaccine  Completed   Hepatitis C Screening  Completed   HPV VACCINES  Aged Out   Meningococcal B Vaccine  Aged Out    Health Maintenance Health Maintenance Due  Topic Date Due   Zoster Vaccines- Shingrix (1 of 2) Never done   Lung Cancer Screening  02/10/2020   DTaP/Tdap/Td (1 - Tdap) 06/24/2022   COVID-19 Vaccine (4 - 2025-26 season) 09/21/2023   Health Maintenance Items Addressed: Referral sent for Low Dose Chest CT (smoker/hx smoking)  Additional  Screening:  Vision Screening: Recommended annual ophthalmology exams for early detection of glaucoma and other disorders of the eye. Would you like a referral to an eye doctor? No    Dental Screening: Recommended annual dental exams for proper oral hygiene  Community Resource Referral / Chronic Care Management: CRR required this visit?  No   CCM required this visit?  No   Plan:    I have personally reviewed and noted the following in the patient's chart:   Medical and social history Use of alcohol, tobacco or illicit drugs  Current medications and supplements including opioid prescriptions. Patient is not currently taking opioid prescriptions. Functional ability and status Nutritional status Physical activity Advanced directives List of other physicians Hospitalizations, surgeries, and ER visits in previous 12 months Vitals Screenings to include cognitive, depression, and falls Referrals and appointments  In addition, I have reviewed and discussed with patient certain preventive protocols, quality metrics, and best practice recommendations. A written personalized care plan for preventive services as well as general preventive health recommendations were provided to patient.   Nayeliz Hipp, CMA   10/22/2023   After Visit Summary: (MyChart) Due to this being a telephonic visit, the after visit summary with patients personalized plan was offered to patient via MyChart   Notes: Nothing significant to report at this time.

## 2023-10-22 NOTE — Patient Instructions (Signed)
 Mr. John Griffin,  Thank you for taking the time for your Medicare Wellness Visit. I appreciate your continued commitment to your health goals. Please review the care plan we discussed, and feel free to reach out if I can assist you further.  Medicare recommends these wellness visits once per year to help you and your care team stay ahead of potential health issues. These visits are designed to focus on prevention, allowing your provider to concentrate on managing your acute and chronic conditions during your regular appointments.  Please note that Annual Wellness Visits do not include a physical exam. Some assessments may be limited, especially if the visit was conducted virtually. If needed, we may recommend a separate in-person follow-up with your provider.  Ongoing Care  Seeing your primary care provider every 3 to 6 months helps us  monitor your health and provide consistent, personalized care.  Referrals   Lung Cancer Screening- Office [address]-First Floor Medical Building directly across from [facility] Phone Number:[phone]   Recommended Screenings:  Health Maintenance  Topic Date Due   Zoster (Shingles) Vaccine (1 of 2) Never done   Screening for Lung Cancer  02/10/2020   DTaP/Tdap/Td vaccine (1 - Tdap) 06/24/2022   COVID-19 Vaccine (4 - 2025-26 season) 09/21/2023   Medicare Annual Wellness Visit  10/21/2024   Colon Cancer Screening  10/05/2031   Pneumococcal Vaccine for age over 92  Completed   Flu Shot  Completed   Hepatitis C Screening  Completed   HPV Vaccine  Aged Out   Meningitis B Vaccine  Aged Out       10/22/2023    8:00 AM  Advanced Directives  Does Patient Have a Medical Advance Directive? No  Would patient like information on creating a medical advance directive? Yes (MAU/Ambulatory/Procedural Areas - Information given)    Advance Care Planning is important because it: Ensures you receive medical care that aligns with your values, goals,  and preferences. Provides guidance to your family and loved ones, reducing the emotional burden of decision-making during critical moments.  Vision: Annual vision screenings are recommended for early detection of glaucoma, cataracts, and diabetic retinopathy. These exams can also reveal signs of chronic conditions such as diabetes and high blood pressure.  Dental: Annual dental screenings help detect early signs of oral cancer, gum disease, and other conditions linked to overall health, including heart disease and diabetes.  Please see the attached documents for additional preventive care recommendations.

## 2023-10-29 ENCOUNTER — Other Ambulatory Visit: Payer: Self-pay

## 2023-10-29 DIAGNOSIS — F17210 Nicotine dependence, cigarettes, uncomplicated: Secondary | ICD-10-CM

## 2023-10-29 DIAGNOSIS — Z122 Encounter for screening for malignant neoplasm of respiratory organs: Secondary | ICD-10-CM

## 2023-10-29 DIAGNOSIS — Z87891 Personal history of nicotine dependence: Secondary | ICD-10-CM

## 2023-11-01 ENCOUNTER — Other Ambulatory Visit: Payer: Self-pay | Admitting: Internal Medicine

## 2023-11-16 ENCOUNTER — Ambulatory Visit
Admission: RE | Admit: 2023-11-16 | Discharge: 2023-11-16 | Disposition: A | Source: Ambulatory Visit | Attending: Acute Care | Admitting: Acute Care

## 2023-11-16 DIAGNOSIS — Z122 Encounter for screening for malignant neoplasm of respiratory organs: Secondary | ICD-10-CM

## 2023-11-16 DIAGNOSIS — Z87891 Personal history of nicotine dependence: Secondary | ICD-10-CM

## 2023-11-16 DIAGNOSIS — F17210 Nicotine dependence, cigarettes, uncomplicated: Secondary | ICD-10-CM

## 2023-11-20 ENCOUNTER — Other Ambulatory Visit: Payer: Self-pay

## 2023-11-20 DIAGNOSIS — Z87891 Personal history of nicotine dependence: Secondary | ICD-10-CM

## 2023-11-20 DIAGNOSIS — F17210 Nicotine dependence, cigarettes, uncomplicated: Secondary | ICD-10-CM

## 2023-11-20 DIAGNOSIS — Z122 Encounter for screening for malignant neoplasm of respiratory organs: Secondary | ICD-10-CM

## 2023-11-23 ENCOUNTER — Encounter: Payer: Self-pay | Admitting: Radiology

## 2023-12-03 ENCOUNTER — Telehealth: Payer: Self-pay

## 2023-12-03 NOTE — Telephone Encounter (Signed)
 Copied from CRM #[id number]. Topic: Clinical - Medication Question >> Dec 03, 2023 10:48 AM Sophia H wrote: Reason for CRM: Patient is currently taking tirzepatide  (ZEPBOUND ) 5 MG/0.5ML Pen and needs to fill but wanting to know if PCP would be willing to up his dose to the 7.5 MG, Used last pen yesterday. Please advise # [phone]  [id number] - [place], [place] - [address]

## 2023-12-04 ENCOUNTER — Other Ambulatory Visit: Payer: Self-pay

## 2023-12-04 DIAGNOSIS — E66812 Obesity, class 2: Secondary | ICD-10-CM

## 2023-12-04 MED ORDER — TIRZEPATIDE-WEIGHT MANAGEMENT 7.5 MG/0.5ML ~~LOC~~ SOLN: 7.5000 mg | SUBCUTANEOUS | 2 refills | Status: AC

## 2023-12-04 NOTE — Telephone Encounter (Signed)
 Zepbound  7.5 mg sent to [facility]

## 2024-10-26 ENCOUNTER — Ambulatory Visit
